# Patient Record
Sex: Male | Born: 1975 | Race: White | Hispanic: No | Smoking: Current every day smoker
Health system: Southern US, Community
[De-identification: ages and names within clinical notes are randomized; demographics above are authoritative.]

## PROBLEM LIST (undated history)

## (undated) DIAGNOSIS — M5136 Other intervertebral disc degeneration, lumbar region: Secondary | ICD-10-CM

## (undated) DIAGNOSIS — F419 Anxiety disorder, unspecified: Secondary | ICD-10-CM

## (undated) DIAGNOSIS — Z765 Malingerer [conscious simulation]: Secondary | ICD-10-CM

## (undated) DIAGNOSIS — F1910 Other psychoactive substance abuse, uncomplicated: Secondary | ICD-10-CM

## (undated) DIAGNOSIS — M51369 Other intervertebral disc degeneration, lumbar region without mention of lumbar back pain or lower extremity pain: Secondary | ICD-10-CM

## (undated) DIAGNOSIS — F329 Major depressive disorder, single episode, unspecified: Secondary | ICD-10-CM

## (undated) DIAGNOSIS — R4586 Emotional lability: Secondary | ICD-10-CM

## (undated) DIAGNOSIS — F32A Depression, unspecified: Secondary | ICD-10-CM

## (undated) DIAGNOSIS — M21619 Bunion of unspecified foot: Secondary | ICD-10-CM

## (undated) DIAGNOSIS — F319 Bipolar disorder, unspecified: Secondary | ICD-10-CM

## (undated) DIAGNOSIS — M2010 Hallux valgus (acquired), unspecified foot: Secondary | ICD-10-CM

## (undated) HISTORY — PX: OTHER SURGICAL HISTORY: SHX169

---

## 2004-06-10 ENCOUNTER — Emergency Department (HOSPITAL_COMMUNITY): Admission: EM | Admit: 2004-06-10 | Discharge: 2004-06-10 | Payer: Self-pay | Admitting: Emergency Medicine

## 2005-02-27 ENCOUNTER — Emergency Department (HOSPITAL_COMMUNITY): Admission: EM | Admit: 2005-02-27 | Discharge: 2005-02-27 | Payer: Self-pay | Admitting: Emergency Medicine

## 2005-07-29 ENCOUNTER — Emergency Department (HOSPITAL_COMMUNITY): Admission: EM | Admit: 2005-07-29 | Discharge: 2005-07-29 | Payer: Self-pay | Admitting: Emergency Medicine

## 2007-10-03 ENCOUNTER — Emergency Department (HOSPITAL_COMMUNITY): Admission: EM | Admit: 2007-10-03 | Discharge: 2007-10-03 | Payer: Self-pay | Admitting: Emergency Medicine

## 2007-10-26 ENCOUNTER — Emergency Department (HOSPITAL_COMMUNITY): Admission: EM | Admit: 2007-10-26 | Discharge: 2007-10-26 | Payer: Self-pay | Admitting: Emergency Medicine

## 2007-10-31 ENCOUNTER — Emergency Department (HOSPITAL_COMMUNITY): Admission: EM | Admit: 2007-10-31 | Discharge: 2007-10-31 | Payer: Self-pay | Admitting: Emergency Medicine

## 2008-02-11 ENCOUNTER — Emergency Department (HOSPITAL_COMMUNITY): Admission: EM | Admit: 2008-02-11 | Discharge: 2008-02-11 | Payer: Self-pay | Admitting: Internal Medicine

## 2008-03-01 ENCOUNTER — Emergency Department (HOSPITAL_COMMUNITY): Admission: EM | Admit: 2008-03-01 | Discharge: 2008-03-01 | Payer: Self-pay | Admitting: Emergency Medicine

## 2010-11-10 ENCOUNTER — Emergency Department (HOSPITAL_COMMUNITY): Payer: Self-pay

## 2010-11-10 ENCOUNTER — Emergency Department (HOSPITAL_COMMUNITY)
Admission: EM | Admit: 2010-11-10 | Discharge: 2010-11-10 | Disposition: A | Discharge status: Home / Self Care | Payer: Self-pay | Attending: Emergency Medicine | Admitting: Emergency Medicine

## 2010-11-10 DIAGNOSIS — Y93B3 Activity, free weights: Secondary | ICD-10-CM | POA: Insufficient documentation

## 2010-11-10 DIAGNOSIS — S46909A Unspecified injury of unspecified muscle, fascia and tendon at shoulder and upper arm level, unspecified arm, initial encounter: Secondary | ICD-10-CM | POA: Insufficient documentation

## 2010-11-10 DIAGNOSIS — M25519 Pain in unspecified shoulder: Secondary | ICD-10-CM | POA: Insufficient documentation

## 2010-11-10 DIAGNOSIS — X500XXA Overexertion from strenuous movement or load, initial encounter: Secondary | ICD-10-CM | POA: Insufficient documentation

## 2010-11-10 DIAGNOSIS — S4980XA Other specified injuries of shoulder and upper arm, unspecified arm, initial encounter: Secondary | ICD-10-CM | POA: Insufficient documentation

## 2010-12-06 ENCOUNTER — Emergency Department (HOSPITAL_COMMUNITY): Payer: Self-pay

## 2010-12-06 ENCOUNTER — Emergency Department (HOSPITAL_COMMUNITY)
Admission: EM | Admit: 2010-12-06 | Discharge: 2010-12-06 | Disposition: A | Discharge status: Home / Self Care | Payer: Self-pay | Attending: Emergency Medicine | Admitting: Emergency Medicine

## 2010-12-06 DIAGNOSIS — M25476 Effusion, unspecified foot: Secondary | ICD-10-CM | POA: Insufficient documentation

## 2010-12-06 DIAGNOSIS — M25473 Effusion, unspecified ankle: Secondary | ICD-10-CM | POA: Insufficient documentation

## 2010-12-06 DIAGNOSIS — M25579 Pain in unspecified ankle and joints of unspecified foot: Secondary | ICD-10-CM | POA: Insufficient documentation

## 2010-12-06 DIAGNOSIS — S93409A Sprain of unspecified ligament of unspecified ankle, initial encounter: Secondary | ICD-10-CM | POA: Insufficient documentation

## 2010-12-06 DIAGNOSIS — X500XXA Overexertion from strenuous movement or load, initial encounter: Secondary | ICD-10-CM | POA: Insufficient documentation

## 2010-12-25 ENCOUNTER — Emergency Department (HOSPITAL_COMMUNITY): Payer: Self-pay

## 2010-12-25 ENCOUNTER — Emergency Department (HOSPITAL_COMMUNITY)
Admission: EM | Admit: 2010-12-25 | Discharge: 2010-12-25 | Disposition: A | Discharge status: Home / Self Care | Payer: Self-pay | Attending: Emergency Medicine | Admitting: Emergency Medicine

## 2010-12-25 DIAGNOSIS — F17200 Nicotine dependence, unspecified, uncomplicated: Secondary | ICD-10-CM | POA: Insufficient documentation

## 2010-12-25 DIAGNOSIS — M25519 Pain in unspecified shoulder: Secondary | ICD-10-CM | POA: Insufficient documentation

## 2011-04-15 ENCOUNTER — Emergency Department (HOSPITAL_COMMUNITY): Payer: Self-pay

## 2011-04-15 ENCOUNTER — Emergency Department (HOSPITAL_COMMUNITY)
Admission: EM | Admit: 2011-04-15 | Discharge: 2011-04-15 | Disposition: A | Discharge status: Home / Self Care | Payer: Self-pay | Attending: Emergency Medicine | Admitting: Emergency Medicine

## 2011-04-15 ENCOUNTER — Encounter: Payer: Self-pay | Admitting: Emergency Medicine

## 2011-04-15 DIAGNOSIS — M21611 Bunion of right foot: Secondary | ICD-10-CM

## 2011-04-15 DIAGNOSIS — S9030XA Contusion of unspecified foot, initial encounter: Secondary | ICD-10-CM | POA: Insufficient documentation

## 2011-04-15 DIAGNOSIS — W1789XA Other fall from one level to another, initial encounter: Secondary | ICD-10-CM | POA: Insufficient documentation

## 2011-04-15 DIAGNOSIS — M21619 Bunion of unspecified foot: Secondary | ICD-10-CM | POA: Insufficient documentation

## 2011-04-15 DIAGNOSIS — S335XXA Sprain of ligaments of lumbar spine, initial encounter: Secondary | ICD-10-CM | POA: Insufficient documentation

## 2011-04-15 DIAGNOSIS — S9032XA Contusion of left foot, initial encounter: Secondary | ICD-10-CM

## 2011-04-15 DIAGNOSIS — S39012A Strain of muscle, fascia and tendon of lower back, initial encounter: Secondary | ICD-10-CM

## 2011-04-15 DIAGNOSIS — Y9269 Other specified industrial and construction area as the place of occurrence of the external cause: Secondary | ICD-10-CM | POA: Insufficient documentation

## 2011-04-15 DIAGNOSIS — F17200 Nicotine dependence, unspecified, uncomplicated: Secondary | ICD-10-CM | POA: Insufficient documentation

## 2011-04-15 HISTORY — DX: Anxiety disorder, unspecified: F41.9

## 2011-04-15 MED ORDER — CYCLOBENZAPRINE HCL 10 MG PO TABS: ORAL_TABLET | ORAL | Status: DC

## 2011-04-15 MED ORDER — IBUPROFEN 800 MG PO TABS: 800.0000 mg | ORAL_TABLET | Freq: Once | ORAL | Status: DC

## 2011-04-15 NOTE — ED Notes (Signed)
Pt states 2 days ago he fell off the back of the truck at work and now c/o lower back pain and states he has corns on his toes.

## 2011-04-15 NOTE — ED Provider Notes (Signed)
History     CSN: [id number] Arrival date & time: 04/15/2011 10:16 AM  Chief Complaint  Patient presents with  . Fall  . Foot Pain  . Back Pain    (Consider location/radiation/quality/duration/timing/severity/associated sxs/prior treatment) Patient is a 35 y.o. male presenting with fall, lower extremity pain, and back pain. The history is provided by the patient. No language interpreter was used.  Fall The accident occurred 2 days ago. Incident: pt was sitting on the raised tailgate of his employers truck.  when he came to a stop he fell out of the truck onto concrete injuring lower back and L foot. He fell from a height of 3 to 5 ft. He landed on concrete. There was no blood loss.  Foot Pain  Back Pain     Past Medical History  Diagnosis Date  . Anxiety     Past Surgical History  Procedure Date  . Fatty tumor removed from left foot     History reviewed. No pertinent family history.  History  Substance Use Topics  . Smoking status: Current Everyday Smoker -- 0.5 packs/day    Types: Cigarettes  . Smokeless tobacco: Not on file  . Alcohol Use: No      Review of Systems  Musculoskeletal: Positive for back pain.       Foot/toe pain  All other systems reviewed and are negative.    Allergies  Review of patient's allergies indicates no known allergies.  Home Medications  No current outpatient prescriptions on file.  BP 155/86  Pulse 76  Temp(Src) 98 F (36.7 C) (Oral)  Resp 17  Ht 5\' 11"  (1.803 m)  Wt 188 lb (85.276 kg)  BMI 26.22 kg/m2  SpO2 98%  Physical Exam  Nursing note and vitals reviewed. Constitutional: He is oriented to person, place, and time. Vital signs are normal. He appears well-developed and well-nourished. No distress.  HENT:  Head: Normocephalic and atraumatic.  Right Ear: External ear normal.  Left Ear: External ear normal.  Nose: Nose normal.  Mouth/Throat: No oropharyngeal exudate.  Eyes: Conjunctivae and EOM are normal. Pupils  are equal, round, and reactive to light. Right eye exhibits no discharge. Left eye exhibits no discharge. No scleral icterus.  Neck: Normal range of motion. Neck supple. No JVD present. No tracheal deviation present. No thyromegaly present.  Cardiovascular: Normal rate, regular rhythm, normal heart sounds, intact distal pulses and normal pulses.  Exam reveals no gallop and no friction rub.   No murmur heard. Pulmonary/Chest: Effort normal and breath sounds normal. No stridor. No respiratory distress. He has no wheezes. He has no rales. He exhibits no tenderness.  Abdominal: Soft. Normal appearance and bowel sounds are normal. He exhibits no distension and no mass. There is no tenderness. There is no rebound and no guarding.  Musculoskeletal: He exhibits tenderness. He exhibits no edema.       Back:       Left foot: He exhibits decreased range of motion, tenderness and bony tenderness. He exhibits no swelling, normal capillary refill, no crepitus, no deformity and no laceration.       Feet:  Lymphadenopathy:    He has no cervical adenopathy.  Neurological: He is alert and oriented to person, place, and time. He has normal reflexes. Coordination normal. GCS eye subscore is 4. GCS verbal subscore is 5. GCS motor subscore is 6.  Skin: Skin is warm and dry. No rash noted. He is not diaphoretic.  Psychiatric: He has a normal mood and affect.  His speech is normal and behavior is normal. Judgment and thought content normal. Cognition and memory are normal.    ED Course  Procedures (including critical care time)  Labs Reviewed - No data to display No results found.   No diagnosis found.    MDM          Worthy Rancher, PA 04/15/11 [phone]

## 2011-04-15 NOTE — ED Notes (Signed)
Pt c/o pain in his lower back and both feet. States that he fell off the back of a truck 2 days ago at work and hit the [employer]. Abrasion noted to left lower back. Also c/o "corns" on the inside of both feet that are hurting. Pt alert and oriented x 3. Skin warm and dry. Color pink. Breath sounds clear and equal bilaterally. Pt states that he is not filing workman's comp.

## 2011-04-15 NOTE — ED Notes (Signed)
Pt waiting for xray result

## 2011-04-15 NOTE — ED Notes (Signed)
Patient was just worried about his xrays and his ride leaving him. RN Kendal Hymen aware.

## 2011-04-15 NOTE — ED Notes (Signed)
Pt states that he would like to file a grievance because his feet are hurting and we did not give him any pain medicine. Diamantina Monks in to see patient. Johny Drilling and Lurene Shadow in to speak with patient. Pt discharged. Pt also upset because his ride was gone and we didn't give him any pain meds and he was going to have to walk. Pt found a ride per security.

## 2011-04-15 NOTE — ED Provider Notes (Signed)
Evaluation and management procedures were performed by the mid-level provider (PA/NP/CNM) under my supervision/collaboration. I was present and available during the ED course. Ruchy Wildrick Y.   Gavin Pound. Camila Maita, MD 04/15/11 1216

## 2011-10-10 ENCOUNTER — Encounter (HOSPITAL_COMMUNITY): Payer: Self-pay | Admitting: Emergency Medicine

## 2011-10-10 ENCOUNTER — Emergency Department (HOSPITAL_COMMUNITY)
Admission: EM | Admit: 2011-10-10 | Discharge: 2011-10-10 | Disposition: A | Discharge status: Home / Self Care | Payer: Self-pay | Attending: Emergency Medicine | Admitting: Emergency Medicine

## 2011-10-10 DIAGNOSIS — M2010 Hallux valgus (acquired), unspecified foot: Secondary | ICD-10-CM

## 2011-10-10 DIAGNOSIS — F17200 Nicotine dependence, unspecified, uncomplicated: Secondary | ICD-10-CM | POA: Insufficient documentation

## 2011-10-10 DIAGNOSIS — M25539 Pain in unspecified wrist: Secondary | ICD-10-CM | POA: Insufficient documentation

## 2011-10-10 DIAGNOSIS — T148XXA Other injury of unspecified body region, initial encounter: Secondary | ICD-10-CM

## 2011-10-10 HISTORY — DX: Hallux valgus (acquired), unspecified foot: M20.10

## 2011-10-10 HISTORY — DX: Bunion of unspecified foot: M21.619

## 2011-10-10 MED ORDER — NAPROXEN 250 MG PO TABS: 250.0000 mg | ORAL_TABLET | Freq: Two times a day (BID) | ORAL | Status: DC

## 2011-10-10 MED ORDER — HYDROCODONE-ACETAMINOPHEN 5-325 MG PO TABS: ORAL_TABLET | ORAL | Status: AC

## 2011-10-10 MED ORDER — METAXALONE 800 MG PO TABS: 800.0000 mg | ORAL_TABLET | Freq: Three times a day (TID) | ORAL | Status: AC

## 2011-10-10 NOTE — ED Notes (Signed)
Pt arrived via EMS secondary to bilateral wrist pain and bilateral calluses on feet. Pt states calluses are "to the bone, very painful with walking and wrist just hurt especially when I pick something up." No distress noted at this time. Pt denies injury at this time.

## 2011-10-10 NOTE — ED Provider Notes (Signed)
History     CSN: [id number]  Arrival date & time 10/10/11  1229   First MD Initiated Contact with Patient 10/10/11 1312      Chief Complaint  Patient presents with  . Wrist Pain    HPI Pt was seen at 1320.   Per pt, c/o gradual onset and persistence of constant bilat palmar hands and volar wrists/forearms "pain" for the past 1 week.  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  Describes the pain as "soreness."  Pt also c/o gradual onset and persistence of constant bilat feet bunions that "are still there" for the past several years.  Denies direct injury to wrists or hands, no fevers, no rash, no swelling, no tingling/numbness in extremities, no focal motor weakness, no neck or back pain.    Past Medical History  Diagnosis Date  . Anxiety   . Hallux valgus with bunions     Past Surgical History  Procedure Date  . Fatty tumor removed from left foot     History  Substance Use Topics  . Smoking status: Current Everyday Smoker -- 0.5 packs/day    Types: Cigarettes  . Smokeless tobacco: Not on file  . Alcohol Use: No    Review of Systems ROS: Statement: All systems negative except as marked or noted in the HPI; Constitutional: Negative for fever and chills. ; ; Eyes: Negative for eye pain, redness and discharge. ; ; ENMT: Negative for ear pain, hoarseness, nasal congestion, sinus pressure and sore throat. ; ; Cardiovascular: Negative for chest pain, palpitations, diaphoresis, dyspnea and peripheral edema. ; ; Respiratory: Negative for cough, wheezing and stridor. ; ; Gastrointestinal: Negative for nausea, vomiting, diarrhea, abdominal pain, blood in stool, hematemesis, jaundice and rectal bleeding. . ; ; Genitourinary: Negative for dysuria, flank pain and hematuria. ; ; Musculoskeletal: +bilat wrist pain.  Negative for back pain and neck pain. Negative for swelling and trauma.; ; Skin: +bunions to bilat feet with calluses.  Negative for pruritus, rash, abrasions, blisters, bruising and skin lesion.; ; Neuro: Negative for headache, lightheadedness and neck stiffness. Negative for weakness, altered level of consciousness , altered mental status, extremity weakness, paresthesias, involuntary movement, seizure and syncope.      Allergies  Review of patient's allergies indicates no known allergies.  Home Medications   Current Outpatient Rx  Name Route Sig Dispense Refill  . CYCLOBENZAPRINE HCL 10 MG PO TABS  1/2 to one tab po TID prn back pain. 20 tablet 0  . IBUPROFEN 200 MG PO TABS Oral Take 200 mg by mouth every 6 (six) hours as needed. Pain     . ONE-A-DAY MENS PO TABS Oral Take 1 tablet by mouth daily.        BP 131/89  Pulse 84  Temp(Src) 97.4 F (36.3 C) (Oral)  Resp 18  Ht 5\' 11"  (1.803 m)  Wt 170 lb (77.111 kg)  BMI 23.71 kg/m2  SpO2 100%  Physical Exam 1325: Physical examination:  Nursing notes reviewed; Vital signs and O2 SAT reviewed;  Constitutional: Well developed, Well nourished, Well hydrated, In no acute distress; Head:  Normocephalic, atraumatic; Eyes: EOMI, PERRL, No scleral icterus; ENMT: Mouth and pharynx normal, Mucous membranes moist; Neck: Supple, Full range of motion, No lymphadenopathy; Cardiovascular: Regular rate and rhythm, No murmur, rub, or gallop; Respiratory: Breath sounds clear & equal bilaterally, No rales, rhonchi, wheezes, or rub, Normal respiratory effort/excursion; Chest: Nontender, Movement normal; Extremities: Pulses normal,  No tenderness, No edema, No calf edema or asymmetry. Distal NMS intact with bilat hands having intact sensation and strength in the distribution of the median, radial, and ulnar nerve function.  Strong radial pulses bilat.  +FROM bilat wrists and elbows with intact motor strength biceps and triceps muscles to resistance.  No bilat snuffbox tenderness.  No pain to bilat axial thumb or 3rd MCP loading.  Bilat forearm compartments soft, brisk cap refill  in fingers.  Neg Tinel's and Phalen's at bilat wrists; Neuro: AA&Ox3, Major CN grossly intact.  No gross focal motor or sensory deficits in extremities.; Skin: Color normal, Warm, Dry, no rash.    ED Course  Procedures   MDM  MDM Reviewed: previous chart, nursing note and vitals      1:42 PM:  When pt demonstrates his repetitive hand and wrist "grabbing" and "pulling" movements he performs at work, he c/o soreness in his palmar hand and volar wrists/forearms; appears to be muscle overuse at this time.  NMS intact, strong radial pulses.  Pt with known bunions on bilat feet, has not seen a Podiatrist yet.  Explained his bunions/hallux deformities will still continue (don't spontaneously "go away").  Pt encouraged to pad his work boots and f/u with his PMD and Podiatrist for his chronic medical issues for good continuity of care.  Verb understanding.         Laray Anger, DO 10/12/11 1157

## 2011-10-10 NOTE — ED Notes (Signed)
Pt arrived by EMS for bilat wrist pain x 1 week

## 2011-10-10 NOTE — Discharge Instructions (Signed)
RESOURCE GUIDE  Dental Problems  Patients with Medicaid: [facility]                     [facility] [address]                                           [address] Phone:  [phone]                                                  Phone:  [phone]  If unable to pay or uninsured, contact:  [facility] or [facility] to become qualified for the adult dental clinic.  Chronic Pain Problems Contact [facility]  [phone] Patients need to be referred by their primary care doctor.  Insufficient Money for Medicine Contact [employer]:  call "211" or [facility] 271-5999.  No Primary Care Doctor Call [facility]  832-8000 Other agencies that provide inexpensive medical care    [facility]  [phone]    [facility]  [phone]    [facility]  [phone]    [facility] Clinic  [phone]    [facility]  [phone]    [facility]  [phone]  Psychological Services [facility]  [phone] [facility]  [phone] [facility]   [phone] (emergency services [phone])  Substance Abuse Resources [facility]  336-882-2125 [facility] 336-784-9470 [facility] 336-285-9073 [facility] 336-845-3988 Residential & Outpatient Substance Abuse Program  800-659-3381  Abuse/Neglect [place] Child Abuse Hotline (336) 641-3795 [place] Child Abuse Hotline 800-378-5315 (After Hours)  Emergency Shelter [facility] (336) 271-5985  Maternity Homes [facility] (336) 275-9566 [facility] (704) 372-4663  MRSA Hotline #:   832-7006    [place] Resources  [facility] [place]     [employer]                          [facility] [address] [place]                       [address]      [address]  [place]                                                [place]                            [place] Phone:  [phone]                                   Phone:  [phone]                 Phone:  [phone]  [facility] Phone:  [phone]    [facility] Child Abuse Hotline (310)301-4580 (618) 009-2945 (After Hours)    Take the prescriptions as directed.  Apply moist heat or ice to the area(s) of discomfort, for 15 minutes at a time, several times per day for the next few days.  Do not fall asleep on a heating or ice pack.  Call your regular medical doctor and your Podiatrist on Monday to schedule a follow up appointment this week.  Return to the Emergency Department immediately if worsening.

## 2012-05-08 ENCOUNTER — Emergency Department (HOSPITAL_COMMUNITY)
Admission: EM | Admit: 2012-05-08 | Discharge: 2012-05-08 | Disposition: A | Discharge status: Home / Self Care | Payer: Self-pay | Attending: Emergency Medicine | Admitting: Emergency Medicine

## 2012-05-08 ENCOUNTER — Encounter (HOSPITAL_COMMUNITY): Payer: Self-pay | Admitting: Emergency Medicine

## 2012-05-08 DIAGNOSIS — F17200 Nicotine dependence, unspecified, uncomplicated: Secondary | ICD-10-CM | POA: Insufficient documentation

## 2012-05-08 DIAGNOSIS — R109 Unspecified abdominal pain: Secondary | ICD-10-CM | POA: Insufficient documentation

## 2012-05-08 DIAGNOSIS — F411 Generalized anxiety disorder: Secondary | ICD-10-CM | POA: Insufficient documentation

## 2012-05-08 DIAGNOSIS — J029 Acute pharyngitis, unspecified: Secondary | ICD-10-CM | POA: Insufficient documentation

## 2012-05-08 DIAGNOSIS — Z79899 Other long term (current) drug therapy: Secondary | ICD-10-CM | POA: Insufficient documentation

## 2012-05-08 LAB — COMPREHENSIVE METABOLIC PANEL
BUN: 9 mg/dL (ref 6–23)
CO2: 28 mEq/L (ref 19–32)
Chloride: 103 mEq/L (ref 96–112)
Creatinine, Ser: 0.9 mg/dL (ref 0.50–1.35)
GFR calc Af Amer: >90 mL/min (ref >90)
GFR calc non Af Amer: >90 mL/min (ref >90)
Glucose, Bld: 93 mg/dL (ref 70–99)
Total Bilirubin: 0.5 mg/dL (ref 0.3–1.2)

## 2012-05-08 LAB — CBC WITH DIFFERENTIAL/PLATELET
Eosinophils Relative: 0 % (ref 0–5)
HCT: 40.8 % (ref 39.0–52.0)
Hemoglobin: 14.1 g/dL (ref 13.0–17.0)
Lymphocytes Relative: 34 % (ref 12–46)
MCV: 91.5 fL (ref 78.0–100.0)
Monocytes Absolute: 0.6 10*3/uL (ref 0.1–1.0)
Monocytes Relative: 7 % (ref 3–12)
Neutro Abs: 4.5 10*3/uL (ref 1.7–7.7)
WBC: 7.7 10*3/uL (ref 4.0–10.5)

## 2012-05-08 LAB — URINALYSIS, ROUTINE W REFLEX MICROSCOPIC
Bilirubin Urine: NEGATIVE
Ketones, ur: NEGATIVE mg/dL
Nitrite: NEGATIVE
Protein, ur: NEGATIVE mg/dL
Urobilinogen, UA: 0.2 mg/dL (ref 0.0–1.0)
pH: 6 (ref 5.0–8.0)

## 2012-05-08 LAB — LIPASE, BLOOD: Lipase: 22 U/L (ref 11–59)

## 2012-05-08 LAB — MONONUCLEOSIS SCREEN: Mono Screen: NEGATIVE

## 2012-05-08 MED ORDER — IBUPROFEN 400 MG PO TABS: 400.0000 mg | ORAL_TABLET | Freq: Four times a day (QID) | ORAL | Status: DC | PRN

## 2012-05-08 NOTE — ED Notes (Signed)
Patient requesting pain meds, physician notified.

## 2012-05-08 NOTE — ED Notes (Signed)
Patient c/o general pain all over, states that he has swollen glands in his abdomen and throat, c/o sore throat at this time. States that he just hurts all over and that this has been going on for 2 years now but it's now gotten so bad that he thought he needed to be checked out.

## 2012-05-08 NOTE — ED Notes (Signed)
abd pain with nodules under skin on abd that have gotten worse.

## 2012-05-08 NOTE — ED Notes (Signed)
Pt escorted from the e.d. By [employer] officers due to pt being irate, upset, wouldn't listen to this nurse about discharge

## 2012-05-08 NOTE — ED Notes (Signed)
Patient aggressive, yelling at staff, security at bedside. Patient refused to sign discharge papers, police escorted patient off of property.

## 2012-05-08 NOTE — ED Provider Notes (Signed)
History  This chart was scribed for Robert Octave, MD by Ardeen Jourdain. This patient was seen in room APA10/APA10 and the patient's care was started at 1959.  CSN: [id number]  Arrival date & time 05/08/12  1954   First MD Initiated Contact with Patient 05/08/12 1959      Chief Complaint  Patient presents with  . Abdominal Pain    c/o of abdominal pain  . Sore Throat    states that his throat hurts and his glands are swollen     The history is provided by the patient. No language interpreter was used.    Robert Kramer is a 36 y.o. male who presents to the Emergency Department complaining of generalized body aches with associated sore throat, fever, chills, nausea and a nodule on his abdomen. He denies emesis. He states that he has had this pain intermittently for the past two years but that today his pain suddenly worsened. He has a h/o anxiety and hallux valgus with bunions. He is a current everyday smoker and occasional alcohol user.    Past Medical History  Diagnosis Date  . Anxiety   . Hallux valgus with bunions     Past Surgical History  Procedure Date  . Fatty tumor removed from left foot     History reviewed. No pertinent family history.  History  Substance Use Topics  . Smoking status: Current Every Day Smoker -- 0.5 packs/day    Types: Cigarettes  . Smokeless tobacco: Not on file  . Alcohol Use: No      Review of Systems  All other systems reviewed and are negative.  A complete 10 system review of systems was obtained and all systems are negative except as noted in the HPI and PMH.    Allergies  Review of patient's allergies indicates no known allergies.  Home Medications   Current Outpatient Rx  Name  Route  Sig  Dispense  Refill  . IBUPROFEN 200 MG PO TABS   Oral   Take 800 mg by mouth every 6 (six) hours as needed. pain         . IBUPROFEN 400 MG PO TABS   Oral   Take 1 tablet (400 mg total) by mouth every 6 (six) hours as needed for  pain.   30 tablet   0   . LORAZEPAM 0.5 MG PO TABS   Oral   Take 0.5 mg by mouth every 8 (eight) hours.         Marland Kitchen NAPROXEN 250 MG PO TABS   Oral   Take 1 tablet (250 mg total) by mouth 2 (two) times daily with a meal.   14 tablet   0     Triage Vitals: BP 133/71  Pulse 87  Temp 97.8 F (36.6 C) (Oral)  Resp 15  Ht 5\' 11"  (1.803 m)  Wt 170 lb (77.111 kg)  BMI 23.71 kg/m2  SpO2 97%  Physical Exam  Nursing note and vitals reviewed. Constitutional: He is oriented to person, place, and time. He appears well-developed and well-nourished. No distress.  HENT:  Head: Normocephalic and atraumatic.  Mouth/Throat: Oropharynx is clear and moist. No oropharyngeal exudate.       Slight erythema in throat, no asymmetry, no meningismus   Eyes: EOM are normal. Pupils are equal, round, and reactive to light.  Neck: Normal range of motion. Neck supple. No tracheal deviation present.  Cardiovascular: Normal rate, regular rhythm and normal heart sounds.   Pulmonary/Chest: Effort  normal and breath sounds normal. No respiratory distress.  Abdominal: Soft. He exhibits no distension. There is no tenderness.       No CVA tenderness  Musculoskeletal: Normal range of motion. He exhibits no edema.  Neurological: He is alert and oriented to person, place, and time.       2-12 cranial nerves intact, 5-5 strength throughout   Skin: Skin is warm and dry.       0.5 firm nodule superior to umbilicus    Psychiatric: He has a normal mood and affect. His behavior is normal.    ED Course  Procedures (including critical care time)  DIAGNOSTIC STUDIES: Oxygen Saturation is 97% on room air, normal by my interpretation.    COORDINATION OF CARE:  8:30 PM: Discussed treatment plan with pt at bedside and pt agreed to plan.    Results for orders placed during the hospital encounter of 05/08/12  CBC WITH DIFFERENTIAL      Component Value Range   WBC 7.7  4.0 - 10.5 K/uL   RBC 4.46  4.22 - 5.81 MIL/uL    Hemoglobin 14.1  13.0 - 17.0 g/dL   HCT 16.1  09.6 - 04.5 %   MCV 91.5  78.0 - 100.0 fL   MCH 31.6  26.0 - 34.0 pg   MCHC 34.6  30.0 - 36.0 g/dL   RDW 40.9  81.1 - 91.4 %   Platelets 142 (*) 150 - 400 K/uL   Neutrophils Relative 59  43 - 77 %   Neutro Abs 4.5  1.7 - 7.7 K/uL   Lymphocytes Relative 34  12 - 46 %   Lymphs Abs 2.6  0.7 - 4.0 K/uL   Monocytes Relative 7  3 - 12 %   Monocytes Absolute 0.6  0.1 - 1.0 K/uL   Eosinophils Relative 0  0 - 5 %   Eosinophils Absolute 0.0  0.0 - 0.7 K/uL   Basophils Relative 0  0 - 1 %   Basophils Absolute 0.0  0.0 - 0.1 K/uL  COMPREHENSIVE METABOLIC PANEL      Component Value Range   Sodium 139  135 - 145 mEq/L   Potassium 3.6  3.5 - 5.1 mEq/L   Chloride 103  96 - 112 mEq/L   CO2 28  19 - 32 mEq/L   Glucose, Bld 93  70 - 99 mg/dL   BUN 9  6 - 23 mg/dL   Creatinine, Ser 7.82  0.50 - 1.35 mg/dL   Calcium 9.7  8.4 - 95.6 mg/dL   Total Protein 6.8  6.0 - 8.3 g/dL   Albumin 3.4 (*) 3.5 - 5.2 g/dL   AST 34  0 - 37 U/L   ALT 52  0 - 53 U/L   Alkaline Phosphatase 63  39 - 117 U/L   Total Bilirubin 0.5  0.3 - 1.2 mg/dL   GFR calc non Af Amer >90  >90 mL/min   GFR calc Af Amer >90  >90 mL/min  LIPASE, BLOOD      Component Value Range   Lipase 22  11 - 59 U/L  MONONUCLEOSIS SCREEN      Component Value Range   Mono Screen NEGATIVE  NEGATIVE  URINALYSIS, ROUTINE W REFLEX MICROSCOPIC      Component Value Range   Color, Urine YELLOW  YELLOW   APPearance CLEAR  CLEAR   Specific Gravity, Urine <1.005 (*) 1.005 - 1.030   pH 6.0  5.0 - 8.0  Glucose, UA NEGATIVE  NEGATIVE mg/dL   Hgb urine dipstick NEGATIVE  NEGATIVE   Bilirubin Urine NEGATIVE  NEGATIVE   Ketones, ur NEGATIVE  NEGATIVE mg/dL   Protein, ur NEGATIVE  NEGATIVE mg/dL   Urobilinogen, UA 0.2  0.0 - 1.0 mg/dL   Nitrite NEGATIVE  NEGATIVE   Leukocytes, UA NEGATIVE  NEGATIVE  RAPID STREP SCREEN      Component Value Range   Streptococcus, Group A Screen (Direct) NEGATIVE  NEGATIVE    No results found.   1. Abdominal pain       MDM  C/o pain in abdomen at site of "bump" above umbilicus.  Intermittent for past 2 years. No vomiting, diarrhea, fever, chest pain SOB.  Good PO intake and urine output.  Nodule on abdominal wall is nontender, not flutuant. No cellulitis. Abdomen completely soft and nontender.  Also c\/o sore throat x 2 days without difficulty breathing or swallowing. Labs and UA unremarkable. Patient nontoxic appearing.  Needs PCP followup.  Patient upset he "didn't get an Xray".  Belligerent and uncooperative at discharge.  I personally performed the services described in this documentation, which was scribed in my presence.  The recorded information has been reviewed and considered.    Robert Octave, MD 05/09/12 0157

## 2012-05-08 NOTE — ED Notes (Signed)
Patient ambulated around nurses station without difficulty. Patient is drinking water at this time. MD notified.

## 2012-08-31 ENCOUNTER — Emergency Department (HOSPITAL_COMMUNITY): Payer: Self-pay

## 2012-08-31 ENCOUNTER — Emergency Department (HOSPITAL_COMMUNITY)
Admission: EM | Admit: 2012-08-31 | Discharge: 2012-08-31 | Disposition: A | Discharge status: Home / Self Care | Payer: Self-pay | Attending: Emergency Medicine | Admitting: Emergency Medicine

## 2012-08-31 ENCOUNTER — Encounter (HOSPITAL_COMMUNITY): Payer: Self-pay

## 2012-08-31 DIAGNOSIS — X500XXA Overexertion from strenuous movement or load, initial encounter: Secondary | ICD-10-CM | POA: Insufficient documentation

## 2012-08-31 DIAGNOSIS — M21619 Bunion of unspecified foot: Secondary | ICD-10-CM | POA: Insufficient documentation

## 2012-08-31 DIAGNOSIS — Y929 Unspecified place or not applicable: Secondary | ICD-10-CM | POA: Insufficient documentation

## 2012-08-31 DIAGNOSIS — M2010 Hallux valgus (acquired), unspecified foot: Secondary | ICD-10-CM | POA: Insufficient documentation

## 2012-08-31 DIAGNOSIS — Z8659 Personal history of other mental and behavioral disorders: Secondary | ICD-10-CM | POA: Insufficient documentation

## 2012-08-31 DIAGNOSIS — S93409A Sprain of unspecified ligament of unspecified ankle, initial encounter: Secondary | ICD-10-CM | POA: Insufficient documentation

## 2012-08-31 DIAGNOSIS — S93401A Sprain of unspecified ligament of right ankle, initial encounter: Secondary | ICD-10-CM

## 2012-08-31 DIAGNOSIS — F17200 Nicotine dependence, unspecified, uncomplicated: Secondary | ICD-10-CM | POA: Insufficient documentation

## 2012-08-31 DIAGNOSIS — Y939 Activity, unspecified: Secondary | ICD-10-CM | POA: Insufficient documentation

## 2012-08-31 MED ORDER — HYDROCODONE-ACETAMINOPHEN 5-325 MG PO TABS
2.0000 | ORAL_TABLET | Freq: Once | ORAL | Status: AC
  Administered 2012-08-31: 2 via ORAL
  Filled 2012-08-31: qty 2

## 2012-08-31 MED ORDER — HYDROCODONE-ACETAMINOPHEN 5-325 MG PO TABS: 1.0000 | ORAL_TABLET | ORAL | Status: DC | PRN

## 2012-08-31 MED ORDER — KETOROLAC TROMETHAMINE 10 MG PO TABS
10.0000 mg | ORAL_TABLET | Freq: Once | ORAL | Status: AC
Start: 2012-08-31 — End: 2012-08-31
  Administered 2012-08-31: 10 mg via ORAL
  Filled 2012-08-31: qty 1

## 2012-08-31 MED ORDER — MELOXICAM 7.5 MG PO TABS: ORAL_TABLET | ORAL | Status: DC

## 2012-08-31 NOTE — ED Provider Notes (Signed)
History     CSN: [id number]  Arrival date & time 08/31/12  1040   First MD Initiated Contact with Patient 08/31/12 1153      Chief Complaint  Patient presents with  . Ankle Pain    (Consider location/radiation/quality/duration/timing/severity/associated sxs/prior treatment) Patient is a 37 y.o. male presenting with ankle pain. The history is provided by the patient.  Ankle Pain Location:  Ankle Time since incident:  4 days Ankle location:  R ankle Pain details:    Quality:  Shooting and throbbing   Severity:  Moderate   Onset quality:  Sudden   Timing:  Constant   Progression:  Worsening Chronicity: new on chronic ankle issue. Dislocation: no   Foreign body present:  No foreign bodies Prior injury to area:  Yes Relieved by:  Nothing Worsened by:  Nothing tried Ineffective treatments:  None tried Associated symptoms: decreased ROM   Associated symptoms: no back pain, no neck pain and no numbness   Risk factors comment:  Several prior injuries to the riht food and ankle.   Past Medical History  Diagnosis Date  . Anxiety   . Hallux valgus with bunions     Past Surgical History  Procedure Laterality Date  . Fatty tumor removed from left foot      No family history on file.  History  Substance Use Topics  . Smoking status: Current Every Day Smoker -- 0.50 packs/day    Types: Cigarettes  . Smokeless tobacco: Not on file  . Alcohol Use: No      Review of Systems  Constitutional: Negative for activity change.       All ROS Neg except as noted in HPI  HENT: Negative for nosebleeds and neck pain.   Eyes: Negative for photophobia and discharge.  Respiratory: Negative for cough, shortness of breath and wheezing.   Cardiovascular: Negative for chest pain and palpitations.  Gastrointestinal: Negative for abdominal pain and blood in stool.  Genitourinary: Negative for dysuria, frequency and hematuria.  Musculoskeletal: Positive for arthralgias. Negative for back  pain.  Skin: Negative.   Neurological: Negative for dizziness, seizures and speech difficulty.  Psychiatric/Behavioral: Negative for hallucinations and confusion. The patient is nervous/anxious.     Allergies  Review of patient's allergies indicates no known allergies.  Home Medications   Current Outpatient Rx  Name  Route  Sig  Dispense  Refill  . Tetrahydrozoline HCl (VISINE EXTRA OP)   Ophthalmic   Apply 2 drops to eye daily as needed (dry eyes).           BP 123/80  Pulse 60  Temp(Src) 97.2 F (36.2 C) (Oral)  Resp 20  Ht 5\' 11"  (1.803 m)  Wt 175 lb (79.379 kg)  BMI 24.42 kg/m2  SpO2 98%  Physical Exam  Nursing note and vitals reviewed. Constitutional: He is oriented to person, place, and time. He appears well-developed and well-nourished.  Non-toxic appearance.  HENT:  Head: Normocephalic.  Right Ear: Tympanic membrane and external ear normal.  Left Ear: Tympanic membrane and external ear normal.  Eyes: EOM and lids are normal. Pupils are equal, round, and reactive to light.  Neck: Normal range of motion. Neck supple. Carotid bruit is not present.  Cardiovascular: Normal rate, regular rhythm, normal heart sounds, intact distal pulses and normal pulses.   Pulmonary/Chest: Breath sounds normal. No respiratory distress.  Abdominal: Soft. Bowel sounds are normal. There is no tenderness. There is no guarding.  Musculoskeletal: Normal range of motion.  Patient has  a hallux valgus of the right great toe. There is a bunion on the medial surface of the right great toe. There is some deformity of the right foot from previous injury. There is pain to palpation in the lateral malleolus. The dorsalis pedis pulses 2+. The capillary refill is less than 3 seconds. There is good range of motion of the right knee and hip.  Lymphadenopathy:       Head (right side): No submandibular adenopathy present.       Head (left side): No submandibular adenopathy present.    He has no cervical  adenopathy.  Neurological: He is alert and oriented to person, place, and time. He has normal strength. No cranial nerve deficit or sensory deficit.  Skin: Skin is warm and dry.  Psychiatric: He has a normal mood and affect. His speech is normal.    ED Course  Procedures (including critical care time)  Labs Reviewed - No data to display Dg Ankle Complete Right  08/31/2012  *RADIOLOGY REPORT*  Clinical Data: Injury, pain.  RIGHT ANKLE - COMPLETE 3+ VIEW  Comparison: Plain films 12/06/2010.  Findings: Small avulsion fracture off the lateral malleolus is seen on prior study.  No acute bony or joint abnormality is identified. No focal bony lesion.  No tibiotalar joint effusion.  IMPRESSION: No acute finding.  Small, remote avulsion fracture distal fibula.   Original Report Authenticated By: Holley Dexter, M.D.    pulse oximetry 98% on room air. Within normal limits by my interpretation.   No diagnosis found.    MDM  I have reviewed nursing notes, vital signs, and all appropriate lab and imaging results for this patient. Tendon related injuries to the right ankle in the past. 2 days ago he states the ankle" gave away". The patient states that he has not had any evaluation concerning his ankle in several years. The patient has had pain of the right ankle since the incident 2 days ago.  The patient has on examination today deformity of the ankle from previous injuries. Deformity of the foot from previous injuries. X-ray reveals a small avulsion fracture off the lateral malleolus, noted on  a prior study, but no acute changes or findings.  The plan at this time is for the patient be fitted with an ankle stirrup splint, he will be given a work note for the next 4-5 days. Patient will be given a prescription for Mobic 7.5 mg twice daily, and Norco 5 mg every 4 hours if needed for pain. The patient is given the name of the orthopedist on call and strongly encouraged to see the orthopedic specialist.         Kathie Dike, PA 08/31/12 1206

## 2012-08-31 NOTE — ED Notes (Signed)
Pt c/o right ankle pain since Friday. Pt states he rolled ankle and the pain "won't go away".

## 2012-08-31 NOTE — ED Provider Notes (Signed)
Medical screening examination/treatment/procedure(s) were performed by non-physician practitioner and as supervising physician I was immediately available for consultation/collaboration.    Vida Roller, MD 08/31/12 [phone]

## 2012-08-31 NOTE — ED Notes (Signed)
Pt reports turned his r ankle Friday and 2 days ago, his ankle "gave out."

## 2012-11-27 ENCOUNTER — Emergency Department (HOSPITAL_COMMUNITY): Payer: Self-pay

## 2012-11-27 ENCOUNTER — Emergency Department (HOSPITAL_COMMUNITY)
Admission: EM | Admit: 2012-11-27 | Discharge: 2012-11-27 | Disposition: A | Discharge status: Home / Self Care | Payer: Self-pay | Attending: Emergency Medicine | Admitting: Emergency Medicine

## 2012-11-27 ENCOUNTER — Encounter (HOSPITAL_COMMUNITY): Payer: Self-pay | Admitting: Emergency Medicine

## 2012-11-27 DIAGNOSIS — S80212A Abrasion, left knee, initial encounter: Secondary | ICD-10-CM

## 2012-11-27 DIAGNOSIS — F17200 Nicotine dependence, unspecified, uncomplicated: Secondary | ICD-10-CM | POA: Insufficient documentation

## 2012-11-27 DIAGNOSIS — Z8659 Personal history of other mental and behavioral disorders: Secondary | ICD-10-CM | POA: Insufficient documentation

## 2012-11-27 DIAGNOSIS — S0990XA Unspecified injury of head, initial encounter: Secondary | ICD-10-CM | POA: Insufficient documentation

## 2012-11-27 DIAGNOSIS — IMO0002 Reserved for concepts with insufficient information to code with codable children: Secondary | ICD-10-CM | POA: Insufficient documentation

## 2012-11-27 DIAGNOSIS — S0081XA Abrasion of other part of head, initial encounter: Secondary | ICD-10-CM

## 2012-11-27 DIAGNOSIS — S80211A Abrasion, right knee, initial encounter: Secondary | ICD-10-CM

## 2012-11-27 DIAGNOSIS — S0003XA Contusion of scalp, initial encounter: Secondary | ICD-10-CM | POA: Insufficient documentation

## 2012-11-27 DIAGNOSIS — S0590XA Unspecified injury of unspecified eye and orbit, initial encounter: Secondary | ICD-10-CM | POA: Insufficient documentation

## 2012-11-27 DIAGNOSIS — S0083XA Contusion of other part of head, initial encounter: Secondary | ICD-10-CM

## 2012-11-27 DIAGNOSIS — S20219A Contusion of unspecified front wall of thorax, initial encounter: Secondary | ICD-10-CM

## 2012-11-27 DIAGNOSIS — Z8739 Personal history of other diseases of the musculoskeletal system and connective tissue: Secondary | ICD-10-CM | POA: Insufficient documentation

## 2012-11-27 MED ORDER — NAPROXEN 500 MG PO TABS: 500.0000 mg | ORAL_TABLET | Freq: Two times a day (BID) | ORAL | Status: DC

## 2012-11-27 MED ORDER — OXYCODONE-ACETAMINOPHEN 7.5-325 MG PO TABS: 1.0000 | ORAL_TABLET | ORAL | Status: DC | PRN

## 2012-11-27 MED ORDER — IBUPROFEN 800 MG PO TABS
800.0000 mg | ORAL_TABLET | Freq: Once | ORAL | Status: DC
  Filled 2012-11-27: qty 1
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
## 2013-02-13 ENCOUNTER — Encounter (HOSPITAL_COMMUNITY): Payer: Self-pay | Admitting: Emergency Medicine

## 2013-02-13 ENCOUNTER — Emergency Department (HOSPITAL_COMMUNITY)
Admission: EM | Admit: 2013-02-13 | Discharge: 2013-02-13 | Discharge status: Against Medical Advice | Payer: Self-pay | Attending: Emergency Medicine | Admitting: Emergency Medicine

## 2013-02-13 DIAGNOSIS — M2011 Hallux valgus (acquired), right foot: Secondary | ICD-10-CM

## 2013-02-13 DIAGNOSIS — M2010 Hallux valgus (acquired), unspecified foot: Secondary | ICD-10-CM | POA: Insufficient documentation

## 2013-02-13 DIAGNOSIS — M21619 Bunion of unspecified foot: Secondary | ICD-10-CM | POA: Insufficient documentation

## 2013-02-13 DIAGNOSIS — X58XXXS Exposure to other specified factors, sequela: Secondary | ICD-10-CM | POA: Insufficient documentation

## 2013-02-13 DIAGNOSIS — S8290XS Unspecified fracture of unspecified lower leg, sequela: Secondary | ICD-10-CM | POA: Insufficient documentation

## 2013-02-13 NOTE — ED Notes (Signed)
Pt has a large bunion on his right foot which he says is due to a traumatic injury seven years ago. He points to a rash around the bunion and between toes as the reason for coming to the ED today.

## 2013-02-13 NOTE — ED Notes (Signed)
Pt not in room. Apparently left ED.

## 2013-02-13 NOTE — ED Provider Notes (Signed)
Medical screening examination/treatment/procedure(s) were performed by non-physician practitioner and as supervising physician I was immediately available for consultation/collaboration.  Seniah Lawrence, MD 02/13/13 1342 

## 2013-02-13 NOTE — ED Notes (Signed)
Pt. Stated, rt. Foot pain for 3 weeks.
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
## 2013-03-06 ENCOUNTER — Emergency Department (HOSPITAL_COMMUNITY)
Admission: EM | Admit: 2013-03-06 | Discharge: 2013-03-06 | Disposition: A | Discharge status: Home / Self Care | Payer: Self-pay | Attending: Emergency Medicine | Admitting: Emergency Medicine

## 2013-03-06 ENCOUNTER — Encounter (HOSPITAL_COMMUNITY): Payer: Self-pay | Admitting: *Deleted

## 2013-03-06 ENCOUNTER — Encounter (HOSPITAL_COMMUNITY): Payer: Self-pay | Admitting: Emergency Medicine

## 2013-03-06 DIAGNOSIS — Z8739 Personal history of other diseases of the musculoskeletal system and connective tissue: Secondary | ICD-10-CM | POA: Insufficient documentation

## 2013-03-06 DIAGNOSIS — F17200 Nicotine dependence, unspecified, uncomplicated: Secondary | ICD-10-CM | POA: Insufficient documentation

## 2013-03-06 DIAGNOSIS — Z8659 Personal history of other mental and behavioral disorders: Secondary | ICD-10-CM | POA: Insufficient documentation

## 2013-03-06 DIAGNOSIS — R109 Unspecified abdominal pain: Secondary | ICD-10-CM | POA: Insufficient documentation

## 2013-03-06 DIAGNOSIS — K5900 Constipation, unspecified: Secondary | ICD-10-CM | POA: Diagnosis present

## 2013-03-06 DIAGNOSIS — Z79899 Other long term (current) drug therapy: Secondary | ICD-10-CM | POA: Insufficient documentation

## 2013-03-06 DIAGNOSIS — R609 Edema, unspecified: Secondary | ICD-10-CM | POA: Insufficient documentation

## 2013-03-06 MED ORDER — GLYCERIN (LAXATIVE) 2.1 G RE SUPP
1.0000 | Freq: Once | RECTAL | Status: AC
  Administered 2013-03-06: 09:00:00 via RECTAL
  Filled 2013-03-06: qty 1

## 2013-03-06 MED ORDER — FLEET ENEMA 7-19 GM/118ML RE ENEM
1.0000 | ENEMA | Freq: Once | RECTAL | Status: DC
  Filled 2013-03-06: qty 1

## 2013-03-06 MED ORDER — POLYETHYLENE GLYCOL 3350 17 GM/SCOOP PO POWD: 17.0000 g | Freq: Every day | ORAL | Status: DC

## 2013-03-06 MED ORDER — DOCUSATE SODIUM 100 MG PO CAPS
100.0000 mg | ORAL_CAPSULE | Freq: Once | ORAL | Status: AC
  Administered 2013-03-06: 100 mg via ORAL
  Filled 2013-03-06: qty 1

## 2013-03-06 NOTE — ED Notes (Signed)
Patient is alert and oriented x3.  He is complaining of constipation that started 2 days ago.  He states that he feels like he needs to have a large BM but it hurts to push.

## 2013-03-06 NOTE — ED Notes (Signed)
Pt states he is constipated, has not had a BM in two days, "feels like it is too big to come out" "I think I need an enema"

## 2013-03-06 NOTE — ED Notes (Signed)
Patient is alert and oriented x3.  He was given DC instructions and follow up visit instructions.  Patient gave verbal understanding.  He was DC ambulatory under his own power to home.  V/S stable.  He was not showing any signs of distress on DC 

## 2013-03-06 NOTE — ED Notes (Signed)
Pt was just seen yesterday for constipation states he could not get Miralax filled.

## 2013-03-06 NOTE — ED Notes (Signed)
Pt complains of "contipation and it's not getting better" Pt reports to being seen here for the same yesterday and was given a prescription for Miralax but did not fill it.

## 2013-03-06 NOTE — ED Provider Notes (Signed)
CSN: [id number]     Arrival date & time 03/06/13  0865 History   First MD Initiated Contact with Patient 03/06/13 0750     Chief Complaint  Patient presents with  . Constipation   (Consider location/radiation/quality/duration/timing/severity/associated sxs/prior Treatment) Patient is a 37 y.o. male presenting with constipation. The history is provided by the patient.  Constipation Severity:  Mild Time since last bowel movement:  3 days Timing:  Intermittent Progression:  Unchanged Chronicity:  Recurrent Stool description:  None produced Relieved by:  Nothing Worsened by:  Nothing tried Ineffective treatments:  None tried Associated symptoms: abdominal pain (mild abd cramping during straining)   Associated symptoms: no diarrhea, no dysuria, no fever, no nausea and no vomiting     Past Medical History  Diagnosis Date  . Anxiety   . Hallux valgus with bunions    Past Surgical History  Procedure Laterality Date  . Fatty tumor removed from left foot     No family history on file. History  Substance Use Topics  . Smoking status: Current Every Day Smoker -- 0.50 packs/day    Types: Cigarettes  . Smokeless tobacco: Not on file  . Alcohol Use: No    Review of Systems  Constitutional: Negative for fever.  HENT: Negative for rhinorrhea, drooling and neck pain.   Eyes: Negative for pain.  Respiratory: Negative for cough and shortness of breath.   Cardiovascular: Negative for chest pain and leg swelling.  Gastrointestinal: Positive for abdominal pain (mild abd cramping during straining) and constipation. Negative for nausea, vomiting and diarrhea.  Genitourinary: Negative for dysuria and hematuria.  Musculoskeletal: Negative for gait problem.  Skin: Negative for color change.  Neurological: Negative for numbness and headaches.  Hematological: Negative for adenopathy.  Psychiatric/Behavioral: Negative for behavioral problems.  All other systems reviewed and are  negative.    Allergies  Review of patient's allergies indicates no known allergies.  Home Medications   Current Outpatient Rx  Name  Route  Sig  Dispense  Refill  . polyethylene glycol powder (GLYCOLAX/MIRALAX) powder   Oral   Take 17 g by mouth daily.   255 g   0    BP 133/66  Pulse 81  Temp(Src) 97.8 F (36.6 C) (Oral)  Resp 18  SpO2 100% Physical Exam  Nursing note and vitals reviewed. Constitutional: He is oriented to person, place, and time. He appears well-developed and well-nourished.  HENT:  Head: Normocephalic and atraumatic.  Right Ear: External ear normal.  Left Ear: External ear normal.  Nose: Nose normal.  Mouth/Throat: Oropharynx is clear and moist. No oropharyngeal exudate.  Eyes: Conjunctivae and EOM are normal. Pupils are equal, round, and reactive to light.  Neck: Normal range of motion. Neck supple.  Cardiovascular: Normal rate, regular rhythm, normal heart sounds and intact distal pulses.  Exam reveals no gallop and no friction rub.   No murmur heard. Pulmonary/Chest: Effort normal and breath sounds normal. No respiratory distress. He has no wheezes.  Abdominal: Soft. Bowel sounds are normal. He exhibits no distension. There is no tenderness. There is no rebound and no guarding.  Musculoskeletal: Normal range of motion. He exhibits no edema and no tenderness.  Neurological: He is alert and oriented to person, place, and time.  Skin: Skin is warm and dry.  Psychiatric: He has a normal mood and affect. His behavior is normal.    ED Course  Procedures (including critical care time) Labs Review Labs Reviewed - No data to display Imaging Review No  results found.  MDM   1. Constipation    8:00 AM 37 y.o. male pw constipation and straining w/ attempted defecation x 3 days. Pt AFVSS here, abd soft and benign. No assoc sx. Seen here several hrs ago, does not have money for otc meds. Will give po meds here and d/c home.   8:02 AM: Discussed options  w/ pt. He insists he would like suppository. Will rec miralax as previously recommended when pt can afford it. I have discussed the diagnosis/risks/treatment options with the patient and believe the pt to be eligible for discharge home to follow-up with pcp as needed. We also discussed returning to the ED immediately if new or worsening sx occur. We discussed the sx which are most concerning (e.g., abd pain, fever, vomiting) that necessitate immediate return. Any new prescriptions provided to the patient are listed below.  New Prescriptions   No medications on file     Junius Argyle, MD 03/06/13 1931

## 2013-03-06 NOTE — ED Provider Notes (Signed)
CSN: [id number]     Arrival date & time 03/06/13  0108 History   First MD Initiated Contact with Patient 03/06/13 0210     Chief Complaint  Patient presents with  . Constipation   (Consider location/radiation/quality/duration/timing/severity/associated sxs/prior Treatment) HPI Comments: Patient presents today with a chief complaint of constipation.  He is requesting an enema.  He states that he has had constipation in the past and that an enema typically works for him.  He reports that his last BM was two days ago, but was hard in consistency.  He denies any blood in his stool.   Denies abdominal pain or rectal pain.  Denies nausea or vomiting.  Denies fever or chills.  He has not tried anything for his constipation prior to arrival.  The history is provided by the patient.    Past Medical History  Diagnosis Date  . Anxiety   . Hallux valgus with bunions    Past Surgical History  Procedure Laterality Date  . Fatty tumor removed from left foot     No family history on file. History  Substance Use Topics  . Smoking status: Current Every Day Smoker -- 0.50 packs/day    Types: Cigarettes  . Smokeless tobacco: Not on file  . Alcohol Use: No    Review of Systems  Gastrointestinal: Positive for constipation.  All other systems reviewed and are negative.    Allergies  Review of patient's allergies indicates no known allergies.  Home Medications  No current outpatient prescriptions on file. BP 113/66  Pulse 75  Temp(Src) 98.1 F (36.7 C) (Oral)  Resp 18  SpO2 98% Physical Exam  Nursing note and vitals reviewed. Constitutional: He appears well-developed and well-nourished. No distress.  HENT:  Head: Normocephalic and atraumatic.  Neck: Normal range of motion. Neck supple.  Cardiovascular: Normal rate, regular rhythm and normal heart sounds.   Pulmonary/Chest: Effort normal and breath sounds normal.  Abdominal: Soft. Bowel sounds are normal. He exhibits no distension and  no mass. There is no tenderness. There is no rebound and no guarding.  Musculoskeletal: Normal range of motion.  Neurological: He is alert.  Skin: Skin is warm and dry. He is not diaphoretic.  Psychiatric: He has a normal mood and affect.    ED Course  Procedures (including critical care time) Labs Review Labs Reviewed - No data to display Imaging Review No results found.  MDM  No diagnosis found. Patient presenting with constipation.  No abdominal pain.  No vomiting.  NO history of abdominal surgeries.  Therefore, feel that bowel obstruction is unlikely.  Patient given enema in the ED and discharged home with Miralax.    Pascal Lux [place], PA-C 03/07/13 1651

## 2013-03-07 NOTE — ED Provider Notes (Signed)
Medical screening examination/treatment/procedure(s) were performed by non-physician practitioner and as supervising physician I was immediately available for consultation/collaboration.  Sunnie Nielsen, MD 03/07/13 2136

## 2013-03-14 ENCOUNTER — Emergency Department (HOSPITAL_COMMUNITY): Payer: Self-pay

## 2013-03-14 ENCOUNTER — Encounter (HOSPITAL_COMMUNITY): Payer: Self-pay | Admitting: Emergency Medicine

## 2013-03-14 ENCOUNTER — Emergency Department (HOSPITAL_COMMUNITY)
Admission: EM | Admit: 2013-03-14 | Discharge: 2013-03-14 | Disposition: A | Discharge status: Home / Self Care | Payer: Self-pay | Attending: Emergency Medicine | Admitting: Emergency Medicine

## 2013-03-14 ENCOUNTER — Emergency Department (HOSPITAL_COMMUNITY)
Admission: EM | Admit: 2013-03-14 | Discharge: 2013-03-14 | Disposition: A | Discharge status: Home / Self Care | Payer: No Typology Code available for payment source | Attending: Emergency Medicine | Admitting: Emergency Medicine

## 2013-03-14 DIAGNOSIS — Z9889 Other specified postprocedural states: Secondary | ICD-10-CM | POA: Insufficient documentation

## 2013-03-14 DIAGNOSIS — W172XXA Fall into hole, initial encounter: Secondary | ICD-10-CM | POA: Insufficient documentation

## 2013-03-14 DIAGNOSIS — Y939 Activity, unspecified: Secondary | ICD-10-CM | POA: Insufficient documentation

## 2013-03-14 DIAGNOSIS — Z8659 Personal history of other mental and behavioral disorders: Secondary | ICD-10-CM | POA: Insufficient documentation

## 2013-03-14 DIAGNOSIS — Y929 Unspecified place or not applicable: Secondary | ICD-10-CM | POA: Insufficient documentation

## 2013-03-14 DIAGNOSIS — W19XXXA Unspecified fall, initial encounter: Secondary | ICD-10-CM | POA: Insufficient documentation

## 2013-03-14 DIAGNOSIS — G8929 Other chronic pain: Secondary | ICD-10-CM | POA: Insufficient documentation

## 2013-03-14 DIAGNOSIS — Y9301 Activity, walking, marching and hiking: Secondary | ICD-10-CM | POA: Insufficient documentation

## 2013-03-14 DIAGNOSIS — F17200 Nicotine dependence, unspecified, uncomplicated: Secondary | ICD-10-CM | POA: Insufficient documentation

## 2013-03-14 DIAGNOSIS — S93409A Sprain of unspecified ligament of unspecified ankle, initial encounter: Secondary | ICD-10-CM | POA: Insufficient documentation

## 2013-03-14 DIAGNOSIS — Z79899 Other long term (current) drug therapy: Secondary | ICD-10-CM | POA: Insufficient documentation

## 2013-03-14 MED ORDER — OXYCODONE-ACETAMINOPHEN 5-325 MG PO TABS
1.0000 | ORAL_TABLET | Freq: Once | ORAL | Status: AC
  Administered 2013-03-14: 1 via ORAL
  Filled 2013-03-14: qty 1

## 2013-03-14 MED ORDER — OXYCODONE-ACETAMINOPHEN 5-325 MG PO TABS: 1.0000 | ORAL_TABLET | Freq: Four times a day (QID) | ORAL | Status: DC | PRN

## 2013-03-14 MED ORDER — IBUPROFEN 800 MG PO TABS: 800.0000 mg | ORAL_TABLET | Freq: Three times a day (TID) | ORAL | Status: DC

## 2013-03-14 MED ORDER — TRAMADOL HCL 50 MG PO TABS: 50.0000 mg | ORAL_TABLET | Freq: Four times a day (QID) | ORAL | Status: DC | PRN

## 2013-03-14 NOTE — ED Notes (Addendum)
Pt presents to the Ed with a complaint of ankle pain.  Pt fell on Monday and hurt his ankle.  Pt went to the [facility] and was seen.  Pt states medicines prescribed are ineffective.  Pt states his right ankle is hurting.  Pt right ankle demonstrates slight swelling.

## 2013-03-14 NOTE — ED Provider Notes (Signed)
CSN: [id number]     Arrival date & time 03/14/13  0258 History   First MD Initiated Contact with Patient 03/14/13 [phone]     Chief Complaint  Patient presents with  . Ankle Pain   HPI  History provided by the patient. Patient is 37 year old male who presents with complaints of right foot and ankle injury. Patient states that he was walking when he suddenly fell through the floor with his right leg going all the way into the floor. Patient states he had to be helped and pulled out of the ground and has had pain and swelling to his ankle and leg since then. He did take one ibuprofen initially without any changes in symptoms. He was transported by EMS with a brace to his foot. No other treatments provided. Pain is worse with any pressure or movements. Pain is a 10 out of 10. Patient does report prior injuries to the same foot and ankle and states that he has caught his leg in the grinder in the past with previous fractures. She does report some chronic issues of pain and swelling to the ankle. No other aggravating or alleviating factors. No other associated symptoms.    Past Medical History  Diagnosis Date  . Anxiety   . Hallux valgus with bunions    Past Surgical History  Procedure Laterality Date  . Fatty tumor removed from left foot     History reviewed. No pertinent family history. History  Substance Use Topics  . Smoking status: Current Every Day Smoker -- 0.50 packs/day    Types: Cigarettes  . Smokeless tobacco: Not on file  . Alcohol Use: No    Review of Systems  Neurological: Negative for weakness and numbness.  All other systems reviewed and are negative.    Allergies  Review of patient's allergies indicates no known allergies.  Home Medications   Current Outpatient Rx  Name  Route  Sig  Dispense  Refill  . polyethylene glycol powder (GLYCOLAX/MIRALAX) powder   Oral   Take 17 g by mouth daily.   255 g   0    BP 159/87  Pulse 76  Temp(Src) 97.8 F (36.6 C)  (Oral)  Resp 20  SpO2 97% Physical Exam  Nursing note and vitals reviewed. Constitutional: He is oriented to person, place, and time. He appears well-developed and well-nourished. No distress.  HENT:  Head: Normocephalic.  Cardiovascular: Normal rate and regular rhythm.   No murmur heard. Pulmonary/Chest: Effort normal and breath sounds normal. No respiratory distress. He has no wheezes. He has no rales.  Abdominal: Soft.  Musculoskeletal:  Reduced range of motion of right ankle secondary to pain and swelling. There is mild to moderate swelling greatest over the lateral malleolus area. No gross deformities. There is a slight contusion more proximally along the fibula area without deformity. Slight erythematous marks along the lateral leg. No sniffing breaks in the skin or lacerations. Normal dorsal pedal pulses and capillary refill in toes.  Neurological: He is alert and oriented to person, place, and time.  Skin: Skin is warm.  Psychiatric: He has a normal mood and affect. His behavior is normal.    ED Course  Procedures    Imaging Review Dg Tibia/fibula Right  03/14/2013   *RADIOLOGY REPORT*  Clinical Data: Ankle pain after fall.  RIGHT TIBIA AND FIBULA - 2 VIEW  Comparison: None.  Findings: The right tibia and fibula appear intact.  No displaced fractures are identified.  No focal bone lesion  or bone destruction.  No radiopaque soft tissue foreign bodies.  IMPRESSION: No acute fractures demonstrated in the right lower leg.   Original Report Authenticated By: Burman Nieves, M.D.   Dg Ankle Complete Right  03/14/2013   *RADIOLOGY REPORT*  Clinical Data: Right ankle pain after fall.  RIGHT ANKLE - COMPLETE 3+ VIEW  Comparison: 08/31/2012  Findings: Mild lateral soft tissue swelling about the right ankle. No evidence of acute fracture or subluxation.  No focal bone lesion or bone destruction.  Bone cortex and trabecular architecture appear intact.  No significant change since previous  study.  IMPRESSION: Mild soft tissue swelling laterally.  No acute bony abnormalities demonstrated in the right ankle.   Original Report Authenticated By: Burman Nieves, M.D.    MDM   1. Ankle sprain and strain, right, initial encounter     Patient seen and evaluated. Patient appears well does not appear in acute distress or significant discomfort.  X-rays reviewed. No signs of broken bones or other concerning injury. There is mild swelling indicating possible sprain. Discussed findings with the patient and treatment plan. Initially prescriptions for tramadol and ibuprofen provided however patient states only Percocet work for his pains. He has had multiple visits for similar complaints of right ankle and foot pains with chronic history of pain. This time I told him I would not prescribe narcotics for his symptoms today and he can followup with a primary care provider or with orthopedic specialist for continued treatments.    Angus Seller, PA-C 03/14/13 [phone]

## 2013-03-14 NOTE — ED Notes (Signed)
Bed: ZO10 Expected date: 03/14/13 Expected time: 2:45 AM Means of arrival: Ambulance Comments: Fall through floor, leg swelling

## 2013-03-14 NOTE — ED Provider Notes (Signed)
Medical screening examination/treatment/procedure(s) were performed by non-physician practitioner and as supervising physician I was immediately available for consultation/collaboration.  Emanual Lamountain M Stetson Pelaez, MD 03/14/13 0557 

## 2013-03-14 NOTE — ED Provider Notes (Signed)
CSN: [id number]     Arrival date & time 03/14/13  1846 History   First MD Initiated Contact with Patient 03/14/13 1932     Chief Complaint  Patient presents with  . Ankle Pain   (Consider location/radiation/quality/duration/timing/severity/associated sxs/prior Treatment) HPI  Robert Kramer is a 37 y.o.male without any significant PMH presents to the ER with complaints of ankle pain. He was seen in the ED yesterday for ankle injury and sent home with Ultram and was told that he did not have a fracture. He endorses the pain being so severe he has not been able to sleep. He denies repeat injury, says he tried the medication prescribed by it has not touched the pain. He has a history of previous injury to that foot requiring numerous surgeries which has left him with some mild deformity to the foot.    Past Medical History  Diagnosis Date  . Anxiety   . Hallux valgus with bunions    Past Surgical History  Procedure Laterality Date  . Fatty tumor removed from left foot     History reviewed. No pertinent family history. History  Substance Use Topics  . Smoking status: Current Every Day Smoker -- 0.50 packs/day    Types: Cigarettes  . Smokeless tobacco: Not on file  . Alcohol Use: No    Review of Systems ROS is negative unless otherwise stated in the HPI   Allergies  Tramadol  Home Medications   Current Outpatient Rx  Name  Route  Sig  Dispense  Refill  . oxyCODONE-acetaminophen (PERCOCET/ROXICET) 5-325 MG per tablet   Oral   Take 1 tablet by mouth every 6 (six) hours as needed for pain.   15 tablet   0    BP 126/84  Pulse 62  Temp(Src) 98.1 F (36.7 C) (Oral)  Resp 16  SpO2 100% Physical Exam  Nursing note and vitals reviewed. Constitutional: He appears well-developed and well-nourished. No distress.  HENT:  Head: Normocephalic and atraumatic.  Eyes: Pupils are equal, round, and reactive to light.  Neck: Normal range of motion. Neck supple.  Cardiovascular:  Normal rate and regular rhythm.   Pulmonary/Chest: Effort normal.  Abdominal: Soft.  Musculoskeletal:       Right ankle: He exhibits decreased range of motion, swelling and ecchymosis. He exhibits no laceration and normal pulse. Tenderness. Lateral malleolus tenderness found. Achilles tendon normal.  Neurological: He is alert.  Skin: Skin is warm and dry.    ED Course  Procedures (including critical care time) Labs Review Labs Reviewed - No data to display Imaging Review Dg Tibia/fibula Right  03/14/2013   *RADIOLOGY REPORT*  Clinical Data: Ankle pain after fall.  RIGHT TIBIA AND FIBULA - 2 VIEW  Comparison: None.  Findings: The right tibia and fibula appear intact.  No displaced fractures are identified.  No focal bone lesion or bone destruction.  No radiopaque soft tissue foreign bodies.  IMPRESSION: No acute fractures demonstrated in the right lower leg.   Original Report Authenticated By: Burman Nieves, M.D.   Dg Ankle Complete Right  03/14/2013   *RADIOLOGY REPORT*  Clinical Data: Right ankle pain after fall.  RIGHT ANKLE - COMPLETE 3+ VIEW  Comparison: 08/31/2012  Findings: Mild lateral soft tissue swelling about the right ankle. No evidence of acute fracture or subluxation.  No focal bone lesion or bone destruction.  Bone cortex and trabecular architecture appear intact.  No significant change since previous study.  IMPRESSION: Mild soft tissue swelling laterally.  No  acute bony abnormalities demonstrated in the right ankle.   Original Report Authenticated By: Burman Nieves, M.D.    MDM   1. Ankle sprain and strain, left, initial encounter    Tramadol isnt working for patient. He has obvious scars from previous surgeries and signifcant swelling to his ankle. The Percocet worked yesterday in the ED. He requests referral to Podiatrist and stronger pain meds. Rx: Percocet #15. Looked patient up on [facility] database and does not have multiple prescriptions.  37 y.o.Robert Kramer's  evaluation in the Emergency Department is complete. It has been determined that no acute conditions requiring further emergency intervention are present at this time. The patient/guardian have been advised of the diagnosis and plan. We have discussed signs and symptoms that warrant return to the ED, such as changes or worsening in symptoms.  Vital signs are stable at discharge. Filed Vitals:   03/14/13 1915  BP: 126/84  Pulse: 62  Temp: 98.1 F (36.7 C)  Resp: 16    Patient/guardian has voiced understanding and agreed to follow-up with the PCP or specialist.  .    Dorthula Matas, PA-C 03/16/13 1407

## 2013-03-14 NOTE — ED Notes (Signed)
Patient transported to X-ray 

## 2013-03-14 NOTE — ED Notes (Signed)
Per EMS, pt found with foot thru floor to his knee.

## 2013-03-14 NOTE — ED Notes (Signed)
Pt complaining of pain, swelling to the right ankle noted

## 2013-03-23 NOTE — ED Provider Notes (Signed)
Medical screening examination/treatment/procedure(s) were performed by non-physician practitioner and as supervising physician I was immediately available for consultation/collaboration.   Enid Skeens, MD 03/23/13 [phone]

## 2013-03-30 ENCOUNTER — Emergency Department (HOSPITAL_COMMUNITY)
Admission: EM | Admit: 2013-03-30 | Discharge: 2013-03-31 | Disposition: A | Discharge status: Other Acute Inpatient Hospital | Payer: No Typology Code available for payment source | Attending: Emergency Medicine | Admitting: Emergency Medicine

## 2013-03-30 ENCOUNTER — Encounter (HOSPITAL_COMMUNITY): Payer: Self-pay | Admitting: Emergency Medicine

## 2013-03-30 DIAGNOSIS — F1010 Alcohol abuse, uncomplicated: Secondary | ICD-10-CM

## 2013-03-30 DIAGNOSIS — Z0289 Encounter for other administrative examinations: Secondary | ICD-10-CM | POA: Insufficient documentation

## 2013-03-30 DIAGNOSIS — F411 Generalized anxiety disorder: Secondary | ICD-10-CM | POA: Insufficient documentation

## 2013-03-30 DIAGNOSIS — F17200 Nicotine dependence, unspecified, uncomplicated: Secondary | ICD-10-CM | POA: Insufficient documentation

## 2013-03-30 DIAGNOSIS — F319 Bipolar disorder, unspecified: Secondary | ICD-10-CM | POA: Insufficient documentation

## 2013-03-30 HISTORY — DX: Major depressive disorder, single episode, unspecified: F32.9

## 2013-03-30 HISTORY — DX: Emotional lability: R45.86

## 2013-03-30 HISTORY — DX: Depression, unspecified: F32.A

## 2013-03-30 HISTORY — DX: Bipolar disorder, unspecified: F31.9

## 2013-03-30 NOTE — ED Provider Notes (Signed)
This chart was scribed for Robert Filter NP, a non-physician practitioner working with No att. providers found by Lewanda Rife, ED Scribe. This patient was seen in room WOTF/NONE and the patient's care was started at 2346.    CSN: [id number]     Arrival date & time 03/30/13  2252 History   First MD Initiated Contact with Patient 03/30/13 2339     Chief Complaint  Patient presents with  . Medical Clearance   (Consider location/radiation/quality/duration/timing/severity/associated sxs/prior Treatment) The history is provided by the patient.   HPI Comments: Robert Kramer is a 37 y.o. male who presents to the Emergency Department complaining of worsening alcoholism and anger onset last month. Reports he is drinking 1/2 gallon of liquor a months. Reports associated HI towards 2 specific people only. Reports having a plan for his HI. Denies SI, auditory and visual hallucinations. Reports he has a plan for HI. Reports illicit drugs use with pills, and marijuana.  Past Medical History  Diagnosis Date  . Anxiety   . Hallux valgus with bunions   . Bipolar 1 disorder   . Mood swings   . Depression    Past Surgical History  Procedure Laterality Date  . Fatty tumor removed from left foot     History reviewed. No pertinent family history. History  Substance Use Topics  . Smoking status: Current Every Day Smoker -- 0.50 packs/day    Types: Cigarettes  . Smokeless tobacco: Not on file  . Alcohol Use: Yes     Comment: heavy     Review of Systems  Psychiatric/Behavioral: Positive for decreased concentration. Negative for suicidal ideas.  All other systems reviewed and are negative.  A complete 10 system review of systems was obtained and all systems are negative except as noted in the HPI and PMH.     Allergies  Tramadol  Home Medications  No current outpatient prescriptions on file. BP 120/72  Pulse 88  Temp(Src) 97.9 F (36.6 C) (Oral)  Resp 16  Ht 5\' 11"  (1.803 m)  Wt  170 lb (77.111 kg)  BMI 23.72 kg/m2  SpO2 98% Physical Exam  Nursing note and vitals reviewed. Constitutional: He is oriented to person, place, and time. He appears well-developed and well-nourished. No distress.  HENT:  Head: Normocephalic and atraumatic.  Eyes: EOM are normal.  Neck: Neck supple. No tracheal deviation present.  Cardiovascular: Normal rate.   Pulmonary/Chest: Effort normal. No respiratory distress.  Musculoskeletal: Normal range of motion.  Neurological: He is alert and oriented to person, place, and time.  Skin: Skin is warm and dry.  Psychiatric: His behavior is normal. His affect is angry. He exhibits a depressed mood. He expresses homicidal ideation. He expresses no suicidal ideation. He expresses homicidal plans. He expresses no suicidal plans.    ED Course  Procedures (including critical care time) Labs Review Labs Reviewed  ETHANOL - Abnormal; Notable for the following:    Alcohol, Ethyl (B) 100 (*)    All other components within normal limits  CBC WITH DIFFERENTIAL - Abnormal; Notable for the following:    Platelets 136 (*)    All other components within normal limits  COMPREHENSIVE METABOLIC PANEL - Abnormal; Notable for the following:    Glucose, Bld 101 (*)    AST 59 (*)    All other components within normal limits  URINE RAPID DRUG SCREEN (HOSP PERFORMED) - Abnormal; Notable for the following:    Cocaine POSITIVE (*)    Tetrahydrocannabinol POSITIVE (*)  All other components within normal limits   Imaging Review No results found.  MDM   1. Alcohol abuse     Medical screening labs reviewed patient is positive for alcohol with a blood level of 100 history screen is positive for cocaine, and marijuana     Robert Filter, NP 03/31/13 2046

## 2013-03-30 NOTE — ED Notes (Signed)
PT CHANGED IN TO [facility] [facility]. PT SEARCHED AND WANDED BY SECURITY

## 2013-03-30 NOTE — ED Notes (Signed)
Pt states he has been drinking anywhere from a fifth to a half gallon a day for the past month  Pt states he just got out of [place] about 3 mths ago  Pt states him and his girlfriend broke up about a month ago and he has been off his meds for about a month as well  Pt states he has been in jail off and on for most of his life  Pt states he has been having thoughts of harming himself and others  Pt is calm and cooperative in triage at this time

## 2013-03-30 NOTE — ED Notes (Signed)
FNP at bedside.

## 2013-03-31 ENCOUNTER — Encounter (HOSPITAL_COMMUNITY): Payer: Self-pay | Admitting: *Deleted

## 2013-03-31 ENCOUNTER — Inpatient Hospital Stay (HOSPITAL_COMMUNITY)
Admission: AD | Admit: 2013-03-31 | Payer: No Typology Code available for payment source | Source: Intra-hospital | Admitting: Psychiatry

## 2013-03-31 ENCOUNTER — Inpatient Hospital Stay (HOSPITAL_COMMUNITY)
Admission: AD | Admit: 2013-03-31 | Discharge: 2013-04-03 | DRG: 897 | Disposition: A | Discharge status: Home / Self Care | Payer: No Typology Code available for payment source | Source: Intra-hospital | Attending: Emergency Medicine | Admitting: Emergency Medicine

## 2013-03-31 ENCOUNTER — Encounter (HOSPITAL_COMMUNITY): Payer: Self-pay

## 2013-03-31 DIAGNOSIS — K5900 Constipation, unspecified: Secondary | ICD-10-CM

## 2013-03-31 DIAGNOSIS — F4324 Adjustment disorder with disturbance of conduct: Secondary | ICD-10-CM

## 2013-03-31 DIAGNOSIS — F314 Bipolar disorder, current episode depressed, severe, without psychotic features: Secondary | ICD-10-CM

## 2013-03-31 DIAGNOSIS — Z79899 Other long term (current) drug therapy: Secondary | ICD-10-CM

## 2013-03-31 DIAGNOSIS — F1020 Alcohol dependence, uncomplicated: Secondary | ICD-10-CM

## 2013-03-31 DIAGNOSIS — F3130 Bipolar disorder, current episode depressed, mild or moderate severity, unspecified: Secondary | ICD-10-CM | POA: Diagnosis present

## 2013-03-31 DIAGNOSIS — S9031XA Contusion of right foot, initial encounter: Secondary | ICD-10-CM

## 2013-03-31 DIAGNOSIS — R45850 Homicidal ideations: Secondary | ICD-10-CM

## 2013-03-31 DIAGNOSIS — F602 Antisocial personality disorder: Secondary | ICD-10-CM | POA: Diagnosis present

## 2013-03-31 LAB — RAPID URINE DRUG SCREEN, HOSP PERFORMED
Amphetamines: NOT DETECTED
Benzodiazepines: NOT DETECTED
Opiates: NOT DETECTED
Tetrahydrocannabinol: POSITIVE — AB

## 2013-03-31 LAB — COMPREHENSIVE METABOLIC PANEL
ALT: 49 U/L (ref 0–53)
Albumin: 3.7 g/dL (ref 3.5–5.2)
Alkaline Phosphatase: 94 U/L (ref 39–117)
BUN: 9 mg/dL (ref 6–23)
Calcium: 9.1 mg/dL (ref 8.4–10.5)
GFR calc Af Amer: >90 mL/min (ref >90)
Potassium: 3.8 mEq/L (ref 3.5–5.1)
Sodium: 137 mEq/L (ref 135–145)
Total Protein: 7.5 g/dL (ref 6.0–8.3)

## 2013-03-31 LAB — CBC WITH DIFFERENTIAL/PLATELET
Basophils Relative: 0 % (ref 0–1)
Eosinophils Absolute: 0.1 10*3/uL (ref 0.0–0.7)
Eosinophils Relative: 1 % (ref 0–5)
MCH: 31.5 pg (ref 26.0–34.0)
MCHC: 34.4 g/dL (ref 30.0–36.0)
MCV: 91.7 fL (ref 78.0–100.0)
Neutrophils Relative %: 62 % (ref 43–77)
Platelets: 136 10*3/uL — ABNORMAL LOW (ref 150–400)

## 2013-03-31 MED ORDER — ALUM & MAG HYDROXIDE-SIMETH 200-200-20 MG/5ML PO SUSP: 30.0000 mL | ORAL | Status: DC | PRN

## 2013-03-31 MED ORDER — THIAMINE HCL 100 MG/ML IJ SOLN: 100.0000 mg | Freq: Every day | INTRAMUSCULAR | Status: DC

## 2013-03-31 MED ORDER — LORAZEPAM 1 MG PO TABS
2.0000 mg | ORAL_TABLET | Freq: Three times a day (TID) | ORAL | Status: DC | PRN
  Administered 2013-03-31 – 2013-04-03 (×8): 2 mg via ORAL
  Filled 2013-03-31 (×8): qty 2

## 2013-03-31 MED ORDER — VITAMIN B-1 100 MG PO TABS
100.0000 mg | ORAL_TABLET | Freq: Every day | ORAL | Status: DC
  Administered 2013-04-01 – 2013-04-03 (×3): 100 mg via ORAL
  Filled 2013-03-31 (×5): qty 1

## 2013-03-31 MED ORDER — THIAMINE HCL 100 MG/ML IJ SOLN: 100.0000 mg | Freq: Once | INTRAMUSCULAR | Status: AC

## 2013-03-31 MED ORDER — ZOLPIDEM TARTRATE 5 MG PO TABS
5.0000 mg | ORAL_TABLET | Freq: Every evening | ORAL | Status: DC | PRN
  Administered 2013-03-31: 5 mg via ORAL
  Filled 2013-03-31: qty 1

## 2013-03-31 MED ORDER — ACETAMINOPHEN 325 MG PO TABS: 650.0000 mg | ORAL_TABLET | Freq: Four times a day (QID) | ORAL | Status: DC | PRN

## 2013-03-31 MED ORDER — IBUPROFEN 600 MG PO TABS
600.0000 mg | ORAL_TABLET | Freq: Three times a day (TID) | ORAL | Status: DC | PRN
  Administered 2013-04-02: 600 mg via ORAL
  Filled 2013-03-31: qty 1

## 2013-03-31 MED ORDER — BENZTROPINE MESYLATE 1 MG/ML IJ SOLN: 1.0000 mg | Freq: Two times a day (BID) | INTRAMUSCULAR | Status: DC | PRN

## 2013-03-31 MED ORDER — CHLORDIAZEPOXIDE HCL 25 MG PO CAPS: 25.0000 mg | ORAL_CAPSULE | Freq: Every day | ORAL | Status: DC

## 2013-03-31 MED ORDER — LORAZEPAM 1 MG PO TABS
0.0000 mg | ORAL_TABLET | Freq: Four times a day (QID) | ORAL | Status: DC
  Administered 2013-03-31: 1 mg via ORAL
  Administered 2013-03-31: 2 mg via ORAL
  Filled 2013-03-31: qty 1
  Filled 2013-03-31: qty 2

## 2013-03-31 MED ORDER — LORAZEPAM 1 MG PO TABS: 1.0000 mg | ORAL_TABLET | Freq: Three times a day (TID) | ORAL | Status: DC | PRN

## 2013-03-31 MED ORDER — IBUPROFEN 200 MG PO TABS: 600.0000 mg | ORAL_TABLET | Freq: Three times a day (TID) | ORAL | Status: DC | PRN

## 2013-03-31 MED ORDER — ADULT MULTIVITAMIN W/MINERALS CH
1.0000 | ORAL_TABLET | Freq: Every day | ORAL | Status: DC
  Administered 2013-04-01 – 2013-04-03 (×3): 1 via ORAL
  Filled 2013-03-31 (×6): qty 1

## 2013-03-31 MED ORDER — MAGNESIUM HYDROXIDE 400 MG/5ML PO SUSP: 30.0000 mL | Freq: Every day | ORAL | Status: DC | PRN

## 2013-03-31 MED ORDER — CHLORDIAZEPOXIDE HCL 25 MG PO CAPS
25.0000 mg | ORAL_CAPSULE | ORAL | Status: DC
  Administered 2013-04-03: 25 mg via ORAL
  Filled 2013-03-31: qty 1

## 2013-03-31 MED ORDER — CHLORDIAZEPOXIDE HCL 25 MG PO CAPS
25.0000 mg | ORAL_CAPSULE | Freq: Three times a day (TID) | ORAL | Status: AC
  Administered 2013-04-02 (×3): 25 mg via ORAL
  Filled 2013-03-31 (×3): qty 1

## 2013-03-31 MED ORDER — HALOPERIDOL LACTATE 5 MG/ML IJ SOLN: 5.0000 mg | Freq: Three times a day (TID) | INTRAMUSCULAR | Status: DC | PRN

## 2013-03-31 MED ORDER — LORAZEPAM 1 MG PO TABS: 0.0000 mg | ORAL_TABLET | Freq: Two times a day (BID) | ORAL | Status: DC

## 2013-03-31 MED ORDER — VITAMIN B-1 100 MG PO TABS
100.0000 mg | ORAL_TABLET | Freq: Every day | ORAL | Status: DC
  Administered 2013-03-31: 100 mg via ORAL
  Filled 2013-03-31: qty 1

## 2013-03-31 MED ORDER — VITAMIN B-1 100 MG PO TABS: 100.0000 mg | ORAL_TABLET | Freq: Every day | ORAL | Status: DC

## 2013-03-31 MED ORDER — DIPHENHYDRAMINE HCL 50 MG/ML IJ SOLN: 50.0000 mg | Freq: Three times a day (TID) | INTRAMUSCULAR | Status: DC | PRN

## 2013-03-31 MED ORDER — HYDROXYZINE HCL 25 MG PO TABS
25.0000 mg | ORAL_TABLET | Freq: Four times a day (QID) | ORAL | Status: DC | PRN
  Administered 2013-03-31 – 2013-04-03 (×5): 25 mg via ORAL
  Filled 2013-03-31: qty 1

## 2013-03-31 MED ORDER — BENZTROPINE MESYLATE 1 MG PO TABS: 1.0000 mg | ORAL_TABLET | Freq: Two times a day (BID) | ORAL | Status: DC | PRN

## 2013-03-31 MED ORDER — TRAZODONE HCL 50 MG PO TABS
50.0000 mg | ORAL_TABLET | Freq: Every evening | ORAL | Status: DC | PRN
  Administered 2013-03-31 – 2013-04-02 (×3): 50 mg via ORAL
  Filled 2013-03-31 (×3): qty 1

## 2013-03-31 MED ORDER — CHLORDIAZEPOXIDE HCL 25 MG PO CAPS
25.0000 mg | ORAL_CAPSULE | Freq: Four times a day (QID) | ORAL | Status: AC
  Administered 2013-03-31 – 2013-04-01 (×6): 25 mg via ORAL
  Filled 2013-03-31 (×6): qty 1

## 2013-03-31 MED ORDER — LORAZEPAM 2 MG/ML IJ SOLN: 2.0000 mg | Freq: Three times a day (TID) | INTRAMUSCULAR | Status: DC | PRN

## 2013-03-31 MED ORDER — LOPERAMIDE HCL 2 MG PO CAPS: 2.0000 mg | ORAL_CAPSULE | ORAL | Status: DC | PRN

## 2013-03-31 MED ORDER — ONDANSETRON 4 MG PO TBDP: 4.0000 mg | ORAL_TABLET | Freq: Four times a day (QID) | ORAL | Status: DC | PRN

## 2013-03-31 MED ORDER — NICOTINE 21 MG/24HR TD PT24
21.0000 mg | MEDICATED_PATCH | Freq: Every day | TRANSDERMAL | Status: DC
  Administered 2013-03-31 – 2013-04-03 (×3): 21 mg via TRANSDERMAL
  Filled 2013-03-31 (×5): qty 1

## 2013-03-31 MED ORDER — ZOLPIDEM TARTRATE 5 MG PO TABS: 5.0000 mg | ORAL_TABLET | Freq: Every evening | ORAL | Status: DC | PRN

## 2013-03-31 MED ORDER — HALOPERIDOL 5 MG PO TABS
5.0000 mg | ORAL_TABLET | Freq: Three times a day (TID) | ORAL | Status: DC | PRN
  Administered 2013-04-02: 5 mg via ORAL
  Filled 2013-03-31: qty 1

## 2013-03-31 MED ORDER — LORAZEPAM 1 MG PO TABS: 0.0000 mg | ORAL_TABLET | Freq: Four times a day (QID) | ORAL | Status: DC

## 2013-03-31 MED ORDER — ONDANSETRON HCL 4 MG PO TABS: 4.0000 mg | ORAL_TABLET | Freq: Three times a day (TID) | ORAL | Status: DC | PRN

## 2013-03-31 MED ORDER — NICOTINE 21 MG/24HR TD PT24
21.0000 mg | MEDICATED_PATCH | Freq: Every day | TRANSDERMAL | Status: DC
  Filled 2013-03-31: qty 1

## 2013-03-31 MED ORDER — DIPHENHYDRAMINE HCL 25 MG PO CAPS: 50.0000 mg | ORAL_CAPSULE | Freq: Three times a day (TID) | ORAL | Status: DC | PRN

## 2013-03-31 MED ORDER — CHLORDIAZEPOXIDE HCL 25 MG PO CAPS: 25.0000 mg | ORAL_CAPSULE | Freq: Four times a day (QID) | ORAL | Status: DC | PRN

## 2013-03-31 NOTE — Progress Notes (Signed)
Pt here for ETOH detox and HI towards "people outside of here", pt did not want to go into detail about who and why during the admission otherwise, pt was cooperative during admission process, pt did however get agitated once brought back on the unit because he said that he left a "very improtant number" in the search room and he was upset that he was not allowed to go back off the unit to look for it.  Staff went to search room and could not find the phone number that he was talking about.  Pt denies SI/AVH, oriented pt to unit.
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
## 2013-03-31 NOTE — Progress Notes (Signed)
[facility] Group Notes:  (Nursing/MHT/Case Management/Adjunct)  Date:  03/31/2013  Time:  2000  Type of Therapy:  Psychoeducational Skills  Participation Level:  Minimal  Participation Quality:  Inattentive  Affect:  Depressed  Cognitive:  Disorganized  Insight:  Lacking  Engagement in Group:  Lacking  Modes of Intervention:  Education  Summary of Progress/Problems: The patient verbalized in group that he had a stressful morning, but his afternoon was much improved. He also verbalized in group that he suffered a few panic attacks during the daytime. His goal for tomorrow is to take his time with the hospital process.   Hazle Coca S 03/31/2013, 11:28 PM

## 2013-03-31 NOTE — ED Provider Notes (Signed)
CSN: [id number]     Arrival date & time 03/30/13  2252 History   First MD Initiated Contact with Patient 03/30/13 2339     Chief Complaint  Patient presents with  . Medical Clearance   (Consider location/radiation/quality/duration/timing/severity/associated sxs/prior Treatment) HPI  Past Medical History  Diagnosis Date  . Anxiety   . Hallux valgus with bunions   . Bipolar 1 disorder   . Mood swings   . Depression    Past Surgical History  Procedure Laterality Date  . Fatty tumor removed from left foot     History reviewed. No pertinent family history. History  Substance Use Topics  . Smoking status: Current Every Day Smoker -- 0.50 packs/day    Types: Cigarettes  . Smokeless tobacco: Not on file  . Alcohol Use: Yes     Comment: heavy     Review of Systems  Allergies  Tramadol  Home Medications  No current outpatient prescriptions on file. BP 120/72  Pulse 88  Temp(Src) 97.9 F (36.6 C) (Oral)  Resp 16  Ht 5\' 11"  (1.803 m)  Wt 170 lb (77.111 kg)  BMI 23.72 kg/m2  SpO2 98% Physical Exam  ED Course  Procedures (including critical care time) Labs Review Labs Reviewed  ETHANOL - Abnormal; Notable for the following:    Alcohol, Ethyl (B) 100 (*)    All other components within normal limits  CBC WITH DIFFERENTIAL - Abnormal; Notable for the following:    Platelets 136 (*)    All other components within normal limits  COMPREHENSIVE METABOLIC PANEL - Abnormal; Notable for the following:    Glucose, Bld 101 (*)    AST 59 (*)    All other components within normal limits  URINE RAPID DRUG SCREEN (HOSP PERFORMED) - Abnormal; Notable for the following:    Cocaine POSITIVE (*)    Tetrahydrocannabinol POSITIVE (*)    All other components within normal limits   Imaging Review No results found.  MDM   1. Alcohol abuse        [name], NP 03/31/13 2046
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
## 2013-03-31 NOTE — BH Assessment (Signed)
Patient accepted by Dr Ladona Ridgel to [facility]. Attending MD Jonalagadda, bed #501-2.

## 2013-03-31 NOTE — [facility] Counselor (Signed)
Pt has been accepted by [facility], 501-2, Dr. Elsie Saas, attending.
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
## 2013-03-31 NOTE — Progress Notes (Signed)
[facility] provided pt with a [employer] orange card application, highlighting [facility] [place].

## 2013-04-01 ENCOUNTER — Encounter (HOSPITAL_COMMUNITY): Payer: Self-pay | Admitting: *Deleted

## 2013-04-01 ENCOUNTER — Inpatient Hospital Stay (HOSPITAL_COMMUNITY): Payer: No Typology Code available for payment source

## 2013-04-01 DIAGNOSIS — F3130 Bipolar disorder, current episode depressed, mild or moderate severity, unspecified: Secondary | ICD-10-CM

## 2013-04-01 MED ORDER — LITHIUM CARBONATE 150 MG PO CAPS
150.0000 mg | ORAL_CAPSULE | Freq: Two times a day (BID) | ORAL | Status: DC
  Administered 2013-04-01 – 2013-04-03 (×4): 150 mg via ORAL
  Filled 2013-04-01 (×7): qty 1

## 2013-04-01 MED ORDER — IBUPROFEN 800 MG PO TABS
800.0000 mg | ORAL_TABLET | Freq: Once | ORAL | Status: AC
Start: 2013-04-01 — End: 2013-04-01
  Administered 2013-04-01: 800 mg via ORAL
  Filled 2013-04-01: qty 1

## 2013-04-01 MED ORDER — IBUPROFEN 800 MG PO TABS: 800.0000 mg | ORAL_TABLET | Freq: Three times a day (TID) | ORAL | Status: DC

## 2013-04-01 NOTE — Progress Notes (Signed)
Patient ID: Robert Kramer, male   DOB: [birth year], 37 y.o.   MRN: [id number] Patient arrived back onto unit. Pt at medication window cursing and agitated stating "None of those fucking doctors know what the fuck their doing! I ain't gonna take that Ibuprofen shit, fuck that! That's bullshit." Pt asked by RN to refrain from profanity, pt did not change behaviors. Dr. Baron Sane notified of outcome of examination. No new orders at this time.

## 2013-04-01 NOTE — Progress Notes (Signed)
[facility] Group Notes:  (Nursing/MHT/Case Management/Adjunct)  Date:  04/01/2013  Time:  2000  Type of Therapy:  Psychoeducational Skills  Participation Level:  Active  Participation Quality:  Attentive  Affect:  Blunted  Cognitive:  Appropriate  Insight:  Improving  Engagement in Group:  Improving  Modes of Intervention:  Education  Summary of Progress/Problems: The patient expressed in group this evening that he had a better day today. First of all , he stated that his peers were supportive of him throughout the day. Secondly, he mentioned that his mother contacted him today and she has agreed to give him a place to live and help him to find a job provided that he take care of himself properly. His goal for tomorrow is to have "respect for everybody's personality".   Hazle Coca S 04/01/2013, 11:41 PM

## 2013-04-01 NOTE — ED Notes (Signed)
Pt yelling and cussing stating that I am not signing anything and I am pissed and is mad cause I broke my foot and no one will give him pain medication". It was explained again to pt he does not have a fx. Pt yelled "just get me the fuck out of here. I want to go".

## 2013-04-01 NOTE — Progress Notes (Signed)
Patient ID: Robert Kramer, male   DOB: 04/25/1976, 37 y.o.   MRN: [id number]  Pt to be transported to ER via security. Report called to Victorino Dike, RN ER Charge nurse. No distress noted at this time.

## 2013-04-01 NOTE — ED Notes (Signed)
Pt refused ibuprofen states i don't usually take this i think it might tear up my stomach. When explained that he doesn't have to take the medication if he doesn't want to he states well this isnt going to help me i need real pain medication I need something strong. He yelled that he wanted  To talk to his MD. I Made PA aware.

## 2013-04-01 NOTE — Progress Notes (Addendum)
Patient ID: Robert Kramer, male   DOB: 1975-11-05, 37 y.o.   MRN: [id number]  Patient approached staff with demands to "Go to the emergency room now!" When RN asked pt why, pt states "Cause I broke my fucking foot, that's the hell why!". Pt with noted redness to right anterior foot, no swelling noted. RN instructed the patient to elevate foot, pt provided an ice pack for comfort, and MD will be called. RN informed pt that foot would be monitored for swelling. Pt began to curse RN yelling "Look you fucking bitch, I'm calling 911 and getting my own damned ride!" RN again asked pt to sit with foot elevated, but pt insistent on ambulating in hallway with foot down. Pt difficult to redirect. Dr. Hilton Cork notified, new orders obtained to transfer pt to ER for evaluation.

## 2013-04-01 NOTE — Progress Notes (Signed)
Patient ID: Robert Kramer, male   DOB: 1976/06/29, 37 y.o.   MRN: [id number] Pt to ER via security, accompanied by male MHT for safety.

## 2013-04-01 NOTE — ED Notes (Signed)
MD at bedside. 

## 2013-04-01 NOTE — Progress Notes (Signed)
Adult Psychoeducational Group Note  Date:  04/01/2013 Time:  0900  Group Topic/Focus:  Healthy Coping Skills  Participation Level:  None  Participation Quality:  Drowsy  Affect:  Flat  Cognitive:  Appropriate  Insight: Lacking  Engagement in Group:  Distracting and None  Modes of Intervention:  Discussion and Education  Additional Comments:  Pt slept through most of there group  Robert Kramer 04/01/2013, 10:20 AM

## 2013-04-01 NOTE — ED Notes (Signed)
Bed: [id number] Expected date: 04/01/13 Expected time: 8:50 PM Means of arrival: Ambulance Comments: Ems, headache

## 2013-04-01 NOTE — ED Provider Notes (Signed)
  Medical screening examination/treatment/procedure(s) were performed by non-physician practitioner and as supervising physician I was immediately available for consultation/collaboration.    Gerhard Munch, MD 04/01/13 2321
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
## 2013-04-01 NOTE — ED Provider Notes (Signed)
CSN: [id number]     Arrival date & time 04/01/13  2050 History  This chart was scribed for non-physician practitioner Ivonne Andrew, working with Gerhard Munch, MD by Carl Best, ED Scribe. This patient was seen in room WTR6/WTR6 and the patient's care was started at 9:58 PM.     Chief Complaint  Patient presents with  . Foot Injury    right foot    Patient is a 37 y.o. male presenting with foot injury. The history is provided by the patient. No language interpreter was used.  Foot Injury  HPI Comments: Robert Kramer is a 37 y.o. male who presents to the Emergency Department complaining of constant pain in his right foot that started this evening after he hit his right foot on a chair while walking.  The patient states that the pain is aggravated with walking.  He states that he iced the are and elevated the right foot with no relief. The patient states that he has a history of weak ankles on his right foot.   He does have history of chronic injury to the foot the fractures previously. No other aggravating or alleviating factors. No associated weakness or numbness in the foot or toes. No other associated symptoms.    Past Medical History  Diagnosis Date  . Anxiety   . Hallux valgus with bunions   . Bipolar 1 disorder   . Mood swings   . Depression    Past Surgical History  Procedure Laterality Date  . Fatty tumor removed from left foot     History reviewed. No pertinent family history. History  Substance Use Topics  . Smoking status: Current Every Day Smoker -- 0.50 packs/day    Types: Cigarettes  . Smokeless tobacco: Not on file  . Alcohol Use: Yes     Comment: heavy     Review of Systems  Musculoskeletal: Positive for arthralgias (right foot).  Neurological: Negative for weakness and numbness.  All other systems reviewed and are negative.    Allergies  Tramadol  Home Medications  No current outpatient prescriptions on file.  Triage Vitals: BP 144/96  Pulse  83  Temp(Src) 98.4 F (36.9 C) (Oral)  Resp 16  Ht 5\' 9"  (1.753 m)  Wt 169 lb (76.658 kg)  BMI 24.95 kg/m2  SpO2 99%  Physical Exam  Constitutional: He is oriented to person, place, and time. He appears well-developed and well-nourished. No distress.  HENT:  Head: Normocephalic and atraumatic.  Right Ear: External ear normal.  Left Ear: External ear normal.  Nose: Nose normal.  Eyes: Conjunctivae are normal.  Cardiovascular: Normal rate, regular rhythm, normal heart sounds and intact distal pulses.   Normal distal pulses and capillary refills to the toes.  Pulmonary/Chest: Effort normal. No respiratory distress. He has no wheezes.  Musculoskeletal: Normal range of motion. He exhibits edema (arch of the right foot) and tenderness.  Localized swelling and mild erythema to the dorsal aspect of the mid foot. Normal dorsal pedal pulse. There is a chronic appearing bunion of the first MTP. Normal range of motion  Neurological: He is alert and oriented to person, place, and time.  Skin: Skin is warm and dry. He is not diaphoretic.  Psychiatric: He has a normal mood and affect.    ED Course  Procedures  DIAGNOSTIC STUDIES: Oxygen Saturation is 99% on room air, normal by my interpretation.    COORDINATION OF CARE: 10:01 PM- Discussed the x-ray results with the patient that showed no  broken bones in the right foot with the patient.  Discussed placing a brace on the patient's right foot and advised him to ice the affected area.   Discussed discharging the patient with medication for pain.  Advised the patient to return to the ED if the pain has not subsided.The patient agreed to the treatment plan.       Imaging Review Dg Foot Complete Right  04/01/2013   *RADIOLOGY REPORT*  Clinical Data: Hit right foot on wooden chair.  Pain at the first metatarsal-phalangeal joint.  RIGHT FOOT COMPLETE - 3+ VIEW  Comparison: None.  Findings: No fracture.  There is a hallux valgus deformity at the  first metatarsal-phalangeal joint.  There is no dislocation.  Mild spurring is noted at the tarsal metatarsal articulations on the lateral view.  The soft tissues are unremarkable.  IMPRESSION: No fracture or dislocation.   Original Report Authenticated By: Amie Portland, M.D.    MDM   1. Bipolar I disorder, most recent episode (or current) depressed, severe, without mention of psychotic behavior   2. Contusion of right foot, initial encounter    Patient seen and evaluated. Patient has old injury to the foot with chronic changes. X-rays reviewed with the patient. No signs of acute injury. There is mild swelling to the dorsal foot consistent with contusion. Patient advised to use rice therapy  I personally performed the services described in this documentation, which was scribed in my presence. The recorded information has been reviewed and is accurate.  Patient was upset with recommendations for ibuprofen. He refused ibuprofen from the nurse. I discussed that with his bruising and swelling there is no indications for any narcotics. I advised him to followup with a primary care provider or his doctors at [facility] for any additional treatments.      Angus Seller, PA-C 04/01/13 2229

## 2013-04-01 NOTE — ED Notes (Signed)
Patient is alert and oriented x3.  He is complaining of right foot pain after hitting his foot on a chair at [facility] day room.   He states that his pain is 10 of 10.

## 2013-04-01 NOTE — Progress Notes (Signed)
Patient ID: Robert Kramer, male   DOB: Dec 31, 1975, 37 y.o.   MRN: [id number]  D: Pt was anxious and agitated during the assessment. Pt was demanding to get ativan for detox threatening to throw chairs or cause some type of ruckus. Informed the writer that he has a "prison mentality".  Stated, "if we knew what was going on in his mind, we would put him in a straight jacket". Writer attempted to encourage pt to take prn librium and vistaril. Pt refused stating he's taken them in the past and none worked. Pt began pacing the hall, talking loudly. Eventually the [facility] and PA spoke to pt and was given prn orders. Pt did accept accept librium and vistaril at apprx 1700.  Came back and asked for ativan at apprx 2000 after being told by another RN that it was available for him. However, during a call regarding another pt, PA informed the writer that ativan was only to be given with extreme agitation. Writer informed the pt that she was instructed to give vistaril and librium.    A:  Support and encouragement was offered. 15 min checks continued for safety.  R: Pt remains safe.     ....Marland KitchenMarland KitchenMarland Kitchen At apprx 2145 pt was smoking in his room. After a search by Clinical research associate and 2 mht's pt was found to have an open pk of cigs and lighter in his possession. Pt later bragged about getting the contraband on the unit.

## 2013-04-01 NOTE — Progress Notes (Addendum)
Patient ID: Robert Kramer, male   DOB: [birth year], 37 y.o.   MRN: [id number] D: Pt is awake and active on the unit this AM. Pt denies SI/HI and A/V hallucinations. Pt's mood is depressed/anxious and his affect is flat/sad. Pt is attending groups but is drowsy and non-participating. Pt c/o foot pain related to the flooring and hx of foot surgeries. Writer provided pt his shoes without the laces in them. Pt is behavior is appropriate and he is cooperative with staff this AM.   A: Encouraged pt to discuss feelings with staff and administered medication per MD orders. Writer also encouraged pt to participate in groups.  R: Pt is attending groups and tolerating medications well. Writer will continue to monitor. 15 minute checks are ongoing for safety.

## 2013-04-01 NOTE — [facility] Group Notes (Signed)
[facility] Group Notes:  (Clinical Social Work)  04/01/2013   3:00-4:00PM  Summary of Progress/Problems:   The main focus of today's process group was for the patient to identify ways in which they have sabotaged their own mental health wellness/recovery.  Motivational interviewing was used to explore the reasons they engage in this behavior, and reasons they may have for wanting to change.  The Stages of Change were explained to the group using a handout.  The patient was in and out of the room numerous times.  He expressed that he self sabotages by stopping exercise, eating poorly, and using alcohol a lot.  He wants to change because he states that he has been like this "enough" and is going to end up dead if he does not stop.  He states he has lived this way his entire life and wants to know a different, better way.  Type of Therapy:  Process Group  Participation Level:  Active  Participation Quality:  Attentive, Intrusive, Inattentive, Redirectable and vacillating  Affect:  Blunted  Cognitive:  Oriented  Insight:  Improving  Engagement in Therapy:  Improving  Modes of Intervention:  Education, Motivational Interviewing   Ambrose Mantle, LCSW 04/01/2013, 4:30 PM

## 2013-04-01 NOTE — Progress Notes (Signed)
Patient ID: Robert Kramer, male   DOB: 22-May-1976, 37 y.o.   MRN: [id number] Pt states to RN, "That doctor here tomorrow will be sending me back for another damned x-ray. My foot was too swollen to see the fucking fracture. He will do it and give me pain medicine!".
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
## 2013-04-02 DIAGNOSIS — F1220 Cannabis dependence, uncomplicated: Secondary | ICD-10-CM

## 2013-04-02 DIAGNOSIS — F1020 Alcohol dependence, uncomplicated: Principal | ICD-10-CM

## 2013-04-02 DIAGNOSIS — F1410 Cocaine abuse, uncomplicated: Secondary | ICD-10-CM

## 2013-04-02 MED ORDER — BENZOCAINE 10 % MT GEL
Freq: Three times a day (TID) | OROMUCOSAL | Status: DC | PRN
  Administered 2013-04-02: 21:00:00 via OROMUCOSAL
  Filled 2013-04-02: qty 9.4

## 2013-04-02 NOTE — [facility] Group Notes (Signed)
[facility] Group Notes:  (Clinical Social Work)  04/02/2013   3:00-4:00PM  Summary of Progress/Problems:   The main focus of today's process group was to   identify the patient's current support system and decide on other supports that can be put in place.  The picture on workbook was used to discuss why additional supports are needed, and a hand-out was distributed with four definitions/levels of support, then used to talk about how patients have given and received all different kinds of support.  An emphasis was placed on using counselor, doctor, therapy groups, 12-step groups, and problem-specific support groups to expand supports.  The patient identified his mother as being supportive of him as long as he is in recovery.  He talked about how AA has not been helpful to him, but later admitted that he has not "worked the program" and did not use his sponsor as he should have prior to latest relapse.  Type of Therapy:  Process Group  Participation Level:  Active  Participation Quality:  Attentive and Sharing  Affect:  Depressed and Flat  Cognitive:  Oriented  Insight:  Improving  Engagement in Therapy:  Improving  Modes of Intervention:  Education,  Support and Processing  Ambrose Mantle, LCSW 04/02/2013, 4:39 PM

## 2013-04-02 NOTE — Progress Notes (Signed)
[facility] Group Notes:  (Nursing/MHT/Case Management/Adjunct)  Date:  04/02/2013  Time:  2000  Type of Therapy:  Psychoeducational Skills  Participation Level:  Active  Participation Quality:  Appropriate  Affect:  Appropriate  Cognitive:  Appropriate  Insight:  Good  Engagement in Group:  Improving  Modes of Intervention:  Education  Summary of Progress/Problems: The patient described his day as having been "pretty good" for a number of reasons. First of all, he mentioned that his medications have been straightened out. . Secondly, he mentioned that he was able to work on some "art". 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 His goal for tomorrow is to "be the best that I can be".   Hazle Coca S 04/02/2013, 11:52 PM

## 2013-04-02 NOTE — Progress Notes (Addendum)
Pt is pleasant and cooperative attending groups. C/O back toothache and is requested pain medication for this. MD made aware. Pt requested ativan for feeling nervous and was given 2mg  of ativan po at 5pm./ Tolerated well and stated it has made him feel better. No SI or HI and contracts for safety.Pt is constantly at the med window asking for any prn meds he can take. He c/o tooth pain and was given oragel for this. Pt was also given 50mg  of trazadone for sleep.He stated he would come back to the med window at 1am for more ativan. Pt does not appear overly anxious or in any distress. He is very vocal on the hall.

## 2013-04-02 NOTE — [facility] Group Notes (Signed)
[facility] Group Notes:  (Nursing/MHT/Case Management/Adjunct)  Date:  04/02/2013  Time:  11:11 AM Type of Therapy: Psychoeducational Skills  Participation Level: Active  Participation Quality: Appropriate  Affect: Depressed  Cognitive: Alert and Appropriate  Insight: Appropriate  Engagement in Group: Developing/Improving  Modes of Intervention: Activity, Discussion, Education and Exploration  Summary of Progress/Problems:  Pt attended group programming, discussed healthy support systems - patient identified areas he needs to change, '' not hang around the same crowd I was drinking around so I can get my life together for my daughter'' Pt was inattentive and somewhat distracted at times , but was supportive to peers while sharing.   Malva Limes 04/02/2013, 11:11 AM

## 2013-04-02 NOTE — Progress Notes (Signed)
Patient ID: Robert Kramer, male   DOB: Nov 30, 1975, 37 y.o.   MRN: [id number]  D: Patient angry, cursing and demanding narcotic medications during shift. Pt difficult to redirect at times and causing distractions for others on the unit. A: Q 15 minute safety checks, encourage group participation and staff/peer interaction. Administer medications as ordered by MD. Redirect behaviors as needed. R: Pt remains agitated and labile, med-seeking and demanding of staff. Pt denies SI.

## 2013-04-02 NOTE — Clinical Social Work Note (Signed)
Two shirts and one pair of long pants were provided to patient at his request so that the clothing he is currently wearing can be washed.  Ambrose Mantle, LCSW 04/02/2013, 4:43 PM

## 2013-04-02 NOTE — Progress Notes (Signed)
[name] MD Progress Note  04/02/2013 5:36 PM Robert Kramer  MRN:  [id number]  Subjective: The patient reports he sleep well last night. The patient reports that he has not had any seizure activity. The patient continues have thoughts of harming his ex-girlfriend's current boyfriend.  He reports he is participating in groups.  He was sent to ED yesterday due to foot pain and became verbally abusive to staff both at the ED and the unit.  Imaging was done in the ED but as the patient did not have a fracture he was given a brace.  Today he states that he has been having a toothache for the past week. He reports a past history of violence and was released from prison recently allegedly due to a felony related to violent behavior.   Diagnosis:   DSM5: Substance/Addictive Disorders:  Alcohol Related Disorder - Severe (303.90) and Cannabis Use Disorder - Severe (304.30) Stimulant Use Disorder-mild (cocaine)   AXIS I: Alcohol Related Disorder - Severe (303.90) and Cannabis Use Disorder - Severe (304.30) Stimulant Use Disorder-mild (cocaine); Rule out Malingering;  Rule out Bipolar I Disorder, most recent episode depressed AXIS II: Antisocial Personality Disorder  AXIS III:  Past Medical History   Diagnosis  Date   .  Anxiety    .  Hallux valgus with bunions    .  Bipolar 1 disorder    .  Mood swings    .  Depression     AXIS IV: economic problems, housing problems and problems related to legal system/crime  AXIS V: 21-30 behavior considerably influenced by delusions or hallucinations OR serious impairment in judgment, communication OR inability to function in almost all areas   ADL's:  Intact  Sleep: Good  Appetite:  Fair  Suicidal Ideation:  Plan:  Patient denies. Intent:  Patient denies Means:  Patient denies. Homicidal Ideation:  Patient reports thoughts of hurting his ex-girlfriend's boyfriend. AEB (as evidenced by):  Psychiatric Specialty Exam: ROS  Blood pressure 122/83, pulse 88,  temperature 98.4 F (36.9 C), temperature source Oral, resp. rate 16, height 5\' 9"  (1.753 m), weight 76.658 kg (169 lb), SpO2 99.00%.Body mass index is 24.95 kg/(m^2).  General Appearance: Casual and Fairly Groomed  Patent attorney::  Good  Speech:  Normal Rate  Volume:  Normal  Mood:  Euthymic  Affect:  Appropriate, Congruent and Full Range  Thought Process:  Coherent, Linear and Logical  Orientation:  Full (Time, Place, and Person)  Thought Content:  WDL  Suicidal Thoughts:  No  Homicidal Thoughts:  No-does endorse wanting to hurt hus ex-girlfriend's boyfriend.   Memory:  Immediate;   Good Recent;   Poor Remote;   Fair  Judgement:  Poor  Insight:  Shallow  Psychomotor Activity:  Normal  Concentration:  Fair  Recall:  Poor  Akathisia:  Negative  Handed:  Right  AIMS (if indicated):     Assets:  Others:  None  Sleep:  Number of Hours: 5.75   Current Medications: Current Facility-Administered Medications  Medication Dose Route Frequency Provider Last Rate Last Dose  . acetaminophen (TYLENOL) tablet 650 mg  650 mg Oral Q6H PRN Earney Navy, NP      . alum & mag hydroxide-simeth (MAALOX/MYLANTA) 200-200-20 MG/5ML suspension 30 mL  30 mL Oral Q4H PRN Earney Navy, NP      . benztropine (COGENTIN) tablet 1 mg  1 mg Oral BID PRN Verne Spurr, PA-C       Or  . benztropine mesylate (  COGENTIN) injection 1 mg  1 mg Intramuscular BID PRN Verne Spurr, PA-C      . chlordiazePOXIDE (LIBRIUM) capsule 25 mg  25 mg Oral Q6H PRN Nehemiah Settle, MD      . Melene Muller ON 04/03/2013] chlordiazePOXIDE (LIBRIUM) capsule 25 mg  25 mg Oral BH-qamhs Nehemiah Settle, MD       Followed by  . [START ON 04/04/2013] chlordiazePOXIDE (LIBRIUM) capsule 25 mg  25 mg Oral Daily Nehemiah Settle, MD      . diphenhydrAMINE (BENADRYL) capsule 50 mg  50 mg Oral Q8H PRN Verne Spurr, PA-C       Or  . diphenhydrAMINE (BENADRYL) injection 50 mg  50 mg Intramuscular Q8H PRN Verne Spurr, PA-C      . haloperidol (HALDOL) tablet 5 mg  5 mg Oral Q8H PRN Verne Spurr, PA-C       Or  . haloperidol lactate (HALDOL) injection 5 mg  5 mg Intramuscular Q8H PRN Verne Spurr, PA-C      . hydrOXYzine (ATARAX/VISTARIL) tablet 25 mg  25 mg Oral Q6H PRN Nehemiah Settle, MD   25 mg at 04/02/13 1209  . ibuprofen (ADVIL,MOTRIN) tablet 600 mg  600 mg Oral Q8H PRN Earney Navy, NP   600 mg at 04/02/13 0821  . lithium carbonate capsule 150 mg  150 mg Oral BID WC Larena Sox, MD   150 mg at 04/02/13 1715  . loperamide (IMODIUM) capsule 2-4 mg  2-4 mg Oral PRN Nehemiah Settle, MD      . LORazepam (ATIVAN) tablet 2 mg  2 mg Oral TID PRN Verne Spurr, PA-C   2 mg at 04/02/13 1715   Or  . LORazepam (ATIVAN) injection 2 mg  2 mg Intramuscular TID PRN Verne Spurr, PA-C      . magnesium hydroxide (MILK OF MAGNESIA) suspension 30 mL  30 mL Oral Daily PRN Earney Navy, NP      . multivitamin with minerals tablet 1 tablet  1 tablet Oral Daily Nehemiah Settle, MD   1 tablet at 04/02/13 0818  . nicotine (NICODERM CQ - dosed in mg/24 hours) patch 21 mg  21 mg Transdermal Daily Nehemiah Settle, MD   21 mg at 04/02/13 0818  . ondansetron (ZOFRAN-ODT) disintegrating tablet 4 mg  4 mg Oral Q6H PRN Nehemiah Settle, MD      . thiamine (VITAMIN B-1) tablet 100 mg  100 mg Oral Daily Nehemiah Settle, MD   100 mg at 04/02/13 0818  . traZODone (DESYREL) tablet 50 mg  50 mg Oral QHS PRN,MR X 1 Earney Navy, NP   50 mg at 04/01/13 2251    Lab Results: No results found for this or any previous visit (from the past 48 hour(s)).  Physical Findings: AIMS: Facial and Oral Movements Muscles of Facial Expression: None, normal Lips and Perioral Area: None, normal Jaw: None, normal Tongue: None, normal,Extremity Movements Upper (arms, wrists, hands, fingers): None, normal Lower (legs, knees, ankles, toes): None, normal, Trunk  Movements Neck, shoulders, hips: None, normal, Overall Severity Severity of abnormal movements (highest score from questions above): None, normal Incapacitation due to abnormal movements: None, normal Patient's awareness of abnormal movements (rate only patient's report): No Awareness, Dental Status Current problems with teeth and/or dentures?: No Does patient usually wear dentures?: No  CIWA:  CIWA-Ar Total: 3 COWS:     Treatment Plan Summary: Daily contact with patient to assess and evaluate symptoms  and progress in treatment Medication management  Plan:  Observation Level/Precautions: Detox  15 minute checks   Laboratory: None   Psychotherapy: Patient will attend group and AA   Medications: Continue detox protocol. Will increase Lithium 300 mg BID. His Ativan was increase today by the NP however I have discussed with the patient about the fact that he would not be discharged on Ativan.  Consultations: None   Discharge Concerns: Compliance. Homicidal ideation towards his ex-girlfriend's boyfriend   Estimated LOS: 5-7   Other: -Will decrease/taper available dosing of PRN Ativan throughout his inpatient stay and taper this medication.  -Lithium level, TSH, BUN creatinine,  prior to discharge.     Medical Decision Making Problem Points:  Established problem, stable/improving (1), Review of last therapy session (1) and Review of psycho-social stressors (1) Data Points:  Review or order medicine tests (1) Review of medication regiment & side effects (2) Review of new medications or change in dosage (2)  I certify that inpatient services furnished can reasonably be expected to improve the patient's condition.   Cloyd Ragas 04/02/2013, 5:36 PM

## 2013-04-02 NOTE — Progress Notes (Signed)
Patient ID: Robert Kramer, male   DOB: 1976/04/03, 37 y.o.   MRN: [id number] D: Pt is awake and active on the unit this AM. Pt denies SI/HI and A/V hallucinations. Pt mood is irritable and his affect is anxious/depressed. Pt is c/o foot pain related to kicking a chair yesterday. Pt states that it is broken, but he is walking on it well this AM. Pt is irritable but cooperative with staff. Pt attended group this AM. Pt shared and was appropriate.  A: Encouraged pt to discuss feelings with staff and administered medication per MD orders. Writer also encouraged pt to participate in groups.  R: Pt is attending groups and tolerating medications well. Writer will continue to monitor. 15 minute checks are ongoing for safety.

## 2013-04-03 MED ORDER — LITHIUM CARBONATE 150 MG PO CAPS: 150.0000 mg | ORAL_CAPSULE | Freq: Two times a day (BID) | ORAL | Status: DC

## 2013-04-03 MED ORDER — LORAZEPAM 0.5 MG PO TABS: ORAL_TABLET | ORAL | Status: DC

## 2013-04-03 NOTE — Progress Notes (Signed)
[facility] Adult Case Management Discharge Plan :  Will you be returning to the same living situation after discharge: Yes,  pt states that he is going to his mom's house in [place] before coming back to [place].  At discharge, do you have transportation home?:Yes,  provided pt with a bus pass and taxi voucher Do you have the ability to pay for your medications:Yes,  access to meds  Release of information consent forms completed and in the chart;  Patient's signature needed at discharge.  Patient to Follow up at: Follow-up Information   Follow up with [facility].   Contact information:   [address] [place] [address] [phone]      Follow up with [facility] On 04/04/2013. (Walk in on this date for hospital discharge appointment, for medication management and therapy.  Walk in clinic is Monday - Friday 8 am - 3 pm)    Contact information:   [address], [place] [address] Phone: [phone] Fax: [phone]      Patient denies SI/HI:   Yes,  denies SI/HI    Safety Planning and Suicide Prevention discussed:  Yes,  discussed with pt and pt's mother.  See suicide prevention education note.   CSW contacted [employer] to do duty to warn due to pt not knowing the man he wants to hurt's location or phone number.  Pt states that he wants to hurt Everlean Alstrom, his ex girlfriend's new boyfriend.  CSW was informed a Emergency planning/management officer would be sent over to talk to CSW about this.  CSw spoke with Officer P. Domitrovitz (badge # [id number]) and explained the situation in regards to pt's HI.  The officer took pt's contact information and Barbara Cower Tuggle's name down.  The officer states that she will attempt to warn Everlean Alstrom and put an alert out as well.    Carmina Miller 04/03/2013, 11:41 AM

## 2013-04-03 NOTE — Tx Team (Signed)
Interdisciplinary Treatment Plan Update (Adult)  Date: 04/03/2013  Time Reviewed:  9:45 AM  Progress in Treatment: Attending groups: Yes Participating in groups:  Yes Taking medication as prescribed:  Yes Tolerating medication:  Yes Family/Significant othe contact made: Yes  Patient understands diagnosis:  Yes Discussing patient identified problems/goals with staff:  Yes Medical problems stabilized or resolved:  Yes Denies suicidal/homicidal ideation: Yes Issues/concerns per patient self-inventory:  Yes Other:  New problem(s) identified: N/A  Discharge Plan or Barriers: Pt will follow up at [facility] for medication management and therapy.    Reason for Continuation of Hospitalization: Stable to d/c  Comments: N/A  Estimated length of stay: D/C today  For review of initial/current patient goals, please see plan of care.  Attendees: Patient: Robert Kramer  04/03/2013 11:38 AM   Family:     Physician:  Dr. Javier Glazier 04/03/2013 11:33 AM   Nursing:   Neill Loft, RN 04/03/2013 11:33 AM   Clinical Social Worker:  Reyes Ivan, LCSWA 04/03/2013 11:33 AM   Other: Verne Spurr, PA 04/03/2013 11:33 AM   Other:  Frankey Shown, MA care coordination 04/03/2013 11:33 AM   Other:  Juline Patch, LCSW 04/03/2013 11:33 AM   Other:  Roswell Miners, RN 04/03/2013 11:34 AM   Other: Quintella Reichert, RN 04/03/2013 11:33 AM   Other: Onnie Boer, RN care manager 04/03/2013 11:33 AM   Other:    Other:    Other:    Other:     Scribe for Treatment Team:   Carmina Miller, 04/03/2013 11:33 AM

## 2013-04-03 NOTE — [facility] Suicide Risk Assessment (Signed)
Suicide Risk Assessment  Discharge Assessment     Demographic Factors:  Male, Adolescent or young adult, Caucasian, Low socioeconomic status and Living alone  Mental Status Per Nursing Assessment::   On Admission:     Current Mental Status by Physician: Mental Status Examination: Patient appeared as per his stated age, casually dressed, and fairly groomed, and maintaining good eye contact. Patient has good mood and his affect was constricted. He has normal rate, rhythm, and volume of speech. His thought process is linear and goal directed. Patient has denied suicidal, homicidal ideations, intentions or plans. Patient has no evidence of auditory or visual hallucinations, delusions, and paranoia. Patient has fair to poor insight judgment and impulse control.  Loss Factors: Decrease in vocational status, Legal issues and Financial problems/change in socioeconomic status  Historical Factors: Impulsivity  Risk Reduction Factors:   Sense of responsibility to family, Religious beliefs about death, Positive social support and Positive therapeutic relationship  Continued Clinical Symptoms:  Alcohol/Substance Abuse/Dependencies  Cognitive Features That Contribute To Risk:  Polarized thinking    Suicide Risk:  Minimal: No identifiable suicidal ideation.  Patients presenting with no risk factors but with morbid ruminations; may be classified as minimal risk based on the severity of the depressive symptoms  Discharge Diagnoses:   AXIS I:  Substance Induced Mood Disorder and Alcohol dependence and alcohol withdrawal, cocaine intoxication AXIS II:  Antisocial Personality Disorder AXIS III:   Past Medical History  Diagnosis Date  . Anxiety   . Hallux valgus with bunions   . Bipolar 1 disorder   . Mood swings   . Depression    AXIS IV:  economic problems, occupational problems, other psychosocial or environmental problems and problems related to social environment AXIS V:  51-60 moderate  symptoms  Plan Of Care/Follow-up recommendations:  Activity:  As tolerated Diet:  Regular  Is patient on multiple antipsychotic therapies at discharge:  No   Has Patient had three or more failed trials of antipsychotic monotherapy by history:  No  Recommended Plan for Multiple Antipsychotic Therapies: NA  Amitai Delaughter,JANARDHAHA R. 04/03/2013, 1:15 PM

## 2013-04-03 NOTE — Progress Notes (Addendum)
D:  Patient denied SI and HI.  Denied A/V hallucinations.  Denied pain.  Fair sleep, improving appetite, low energy level, poor attention span.  Rated depression #9, hopeless #8.  Has experienced chilling, cravings, agitation.  Pain goal 8, worst pain 8.  Plans NA & AA.  No discharge plans.  No problems taking meds after discharge.   A:  Medication given for anxiety.  Emotional support and encouragement given patient. R:  Will continue to monitor for safety with 15 minute checks.  Safety maintained.

## 2013-04-03 NOTE — ED Provider Notes (Signed)
Medical screening examination/treatment/procedure(s) were performed by non-physician practitioner and as supervising physician I was immediately available for consultation/collaboration.    Demetruis Depaul R Azhar Knope, MD 04/03/13 1018 

## 2013-04-03 NOTE — Progress Notes (Signed)
Patient ID: Robert Kramer, male   DOB: Jul 04, 1976, 37 y.o.   MRN: [id number] D: The patient is resting in bed with eyes closed. A: No distress noted.  Safety maintained R. Pt. is safe. Will continue to monitor.

## 2013-04-03 NOTE — ED Provider Notes (Signed)
Medical screening examination/treatment/procedure(s) were performed by non-physician practitioner and as supervising physician I was immediately available for consultation/collaboration.    Babita Amaker R Mariana Wiederholt, MD 04/03/13 1019 

## 2013-04-03 NOTE — Discharge Summary (Signed)
Physician Discharge Summary Note  Patient:  Robert Kramer is an 37 y.o., male MRN:  [id number] DOB:  07-22-1975 Patient phone:  [phone] (home)  Patient address:   [address] [place] [address],   Date of Admission:  03/31/2013 Date of Discharge: 04/03/2013  Reason for Admission:  Alcohol detox  Discharge Diagnoses: Active Problems:   * No active hospital problems. *  ROS  DSM5: Assessment:  DSM5:  Substance/Addictive Disorders: Alcohol Related Disorder - Severe (303.90) and Cannabis Use Disorder - Mild (305.20), Cocaine  Depressive Disorders: Bipolar I Disorder, most recent episode depressed.  AXIS I: Bipolar I Disorder, most recent episode depressed.  AXIS II: Antisocial Personality Disorder  AXIS III:  Past Medical History   Diagnosis  Date   .  Anxiety    .  Hallux valgus with bunions    .  Bipolar 1 disorder    .  Mood swings    .  Depression     AXIS IV: economic problems, housing problems and problems related to legal system/crime  AXIS V: 21-30 behavior considerably influenced by delusions or hallucinations OR serious impairment in judgment, communication OR inability to function in almost all areas   Level of Care:  OP  Hospital Course:          Robert Kramer was admitted voluntarily from the ED where he presented with a chief complaint of needing detox from alcohol claiming he was drinking 1/2 gallon of liquor a day for several months, and polysubstance abuse with anger issues. He stated that he had HI toward his ex-girlfriend and the man she was currently with. He has never had a history of withdrawal seizures and reports a history of varied drugs of abuse. His drug of choice is alcohol with a BAL of 100, and his UDS was + for cocaine and marijuana.  He was admitted for detox and stabilization.         Robert Kramer is a career felon with a 15 year prison history who arrived at the unit cursing and demanding Ativan. He was agitated and irritated and demanding drugs  threatening to "go off" if he did not get what he wanted immediately.         He was difficult to redirect but educated regarding our detox protocol. He was ordered Haldol, Ativan, and Benedryl for escalation, but was able to manipulate the staff to get the Ativan. With in 24 hours of his arrival he was found smoking, and in possession of cigarettes and a lighter. When confronted he responded that "your system is not fool proof, I have prison mentality," and laughed.          Robert Kramer was flirtatious with the male patients engendering support for his problems. He continued to seek narcotic medication and at one point kicked a chair. He then demanded to go to the ED. He was evaluated in the ED, his Xray was negative for fracture, and he was recommended Ibuprofen. He cursed the ED staff and was agitated that he did not get narcotics.  Upon returning to the unit he continued to agitate and curse needing redirection and closer supervision.           He was given Ultram for his foot and started on Lithium by the provider the next day. His CIWA score was below 5 consistently and his primary symptom was agitation. Upon completion of his detox, he was discharged out.  He gave [place] the name of his intended victims and the CM will complete the duty to inform them of his discharge. Robert Kramer did not state a plan for harm and was felt to be malingering to stay in the hospital for narcotics. He demanded Ativan on discharge threatening to "go off" on staff if he did not get it. The police were called to assist with his discharge. He was given a prescription for 5 Ativan tablets with no refills in order to keep him from escalating any further. He did leave the hospital under the watchful eye of [employer] but not without cursing this provider and [facility] staff.           Consults:  None  Significant Diagnostic Studies:  radiology: X-Ray: foot  Discharge Vitals:   Blood pressure 109/76, pulse 88, temperature 97.4 F (36.3 C),  temperature source Oral, resp. rate 16, height 5\' 9"  (1.753 m), weight 76.658 kg (169 lb), SpO2 99.00%. Body mass index is 24.95 kg/(m^2). Lab Results:   No results found for this or any previous visit (from the past 72 hour(s)).  Physical Findings: AIMS: Facial and Oral Movements Muscles of Facial Expression: None, normal Lips and Perioral Area: None, normal Jaw: None, normal Tongue: None, normal,Extremity Movements Upper (arms, wrists, hands, fingers): None, normal Lower (legs, knees, ankles, toes): None, normal, Trunk Movements Neck, shoulders, hips: None, normal, Overall Severity Severity of abnormal movements (highest score from questions above): None, normal Incapacitation due to abnormal movements: None, normal Patient's awareness of abnormal movements (rate only patient's report): No Awareness, Dental Status Current problems with teeth and/or dentures?: No Does patient usually wear dentures?: No  CIWA:  CIWA-Ar Total: 3 COWS:  COWS Total Score: 1  Psychiatric Specialty Exam: See Psychiatric Specialty Exam and Suicide Risk Assessment completed by Attending Physician prior to discharge.  Discharge destination:  Home  Is patient on multiple antipsychotic therapies at discharge:  No   Has Patient had three or more failed trials of antipsychotic monotherapy by history:  No  Recommended Plan for Multiple Antipsychotic Therapies: NA  Discharge Orders   Future Orders Complete By Expires   Diet - low sodium heart healthy  As directed    Discharge instructions  As directed    Comments:     Take all of your medications as directed. Be sure to keep all of your follow up appointments.  If you are unable to keep your follow up appointment, call your Doctor's office to let them know, and reschedule.  Make sure that you have enough medication to last until your appointment. Be sure to get plenty of rest. Going to bed at the same time each night will help. Try to avoid sleeping during  the day.  Increase your activity as tolerated. Regular exercise will help you to sleep better and improve your mental health. Eating a heart healthy diet is recommended. Try to avoid salty or fried foods. Be sure to avoid all alcohol and illegal drugs.   Increase activity slowly  As directed        Medication List       Indication   ibuprofen 800 MG tablet  Commonly known as:  ADVIL,MOTRIN  Take 1 tablet (800 mg total) by mouth 3 (three) times daily.     For foot pain   lithium carbonate 150 MG capsule  Take 1 capsule (150 mg total) by mouth 2 (two) times daily with a meal. For mood stabilization.   Indication:  Manic-Depression  Follow-up Information   Follow up with [facility].   Contact information:   [address] [place] [address] [phone]      Follow up with [facility] On 04/04/2013. (Walk in on this date for hospital discharge appointment, for medication management and therapy.  Walk in clinic is Monday - Friday 8 am - 3 pm)    Contact information:   [address], [place] [address] Phone: [phone] Fax: [phone]      Follow-up recommendations:   Activities: Resume activity as tolerated. Diet: Heart healthy low sodium diet Tests: Follow up testing will be determined by your out patient provider.  Comments:  If [facility] should need admission in the future a higher level of care is recommended. He is not to be readmitted to this [facility].  Total Discharge Time:  Less than 30 minutes.  Signed: MASHBURN,NEIL 04/03/2013, 11:51 AM  Patient was seen personally, completed suicide risk assessment and made treatment plan and case discussed with the treatment team. Reviewed the information documented and agree with the treatment plan.  Makynlie Rossini,JANARDHAHA R. 04/08/2013 5:14 PM

## 2013-04-03 NOTE — Progress Notes (Signed)
Discharge Note:  Patient was discharged.  Taxi came for patient.  Patient became very upset over not having ativan given to him as take home medication.  Patient talked very loudly, paced hallway angrily.  Staff alerted, police called, taxi waiting for him.  Patient escorted to lobby with Irene Pap, security and MHTs.  Patient waited in lobby for ativan medication and will leave [facility]  Patient has verbal altercation with another patient this morning and was counseled by MD, staff.  Patient easily agitated, excited over small incidents.  Patient labile and volatile.  Patient refused to sign discharge information and then consented to sign all forms.

## 2013-04-03 NOTE — [facility] Suicide Risk Assessment (Signed)
[facility] INPATIENT:  Family/Significant Other Suicide Prevention Education  Suicide Prevention Education:  Education Completed; Robert Kramer - mother [phone]),  (name of family member/significant other) has been identified by the patient as the family member/significant other with whom the patient will be residing, and identified as the person(s) who will aid the patient in the event of a mental health crisis (suicidal ideations/suicide attempt).  With written consent from the patient, the family member/significant other has been provided the following suicide prevention education, prior to the and/or following the discharge of the patient.  The suicide prevention education provided includes the following:  Suicide risk factors  Suicide prevention and interventions  National Suicide Hotline telephone number  [facility] assessment telephone number  [facility] Emergency Assistance 911  [facility] and/or Residential Mobile Crisis Unit telephone number  Request made of family/significant other to:  Remove weapons (e.g., guns, rifles, knives), all items previously/currently identified as safety concern.    Remove drugs/medications (over-the-counter, prescriptions, illicit drugs), all items previously/currently identified as a safety concern.  The family member/significant other verbalizes understanding of the suicide prevention education information provided.  The family member/significant other agrees to remove the items of safety concern listed above.  Robert Kramer 04/03/2013, 11:24 AM
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
## 2013-04-04 ENCOUNTER — Encounter (HOSPITAL_COMMUNITY): Payer: Self-pay | Admitting: *Deleted

## 2013-04-04 ENCOUNTER — Emergency Department (HOSPITAL_COMMUNITY)
Admission: EM | Admit: 2013-04-04 | Discharge: 2013-04-04 | Disposition: A | Discharge status: Home / Self Care | Payer: No Typology Code available for payment source | Attending: Emergency Medicine | Admitting: Emergency Medicine

## 2013-04-04 DIAGNOSIS — F17200 Nicotine dependence, unspecified, uncomplicated: Secondary | ICD-10-CM | POA: Insufficient documentation

## 2013-04-04 DIAGNOSIS — Z8739 Personal history of other diseases of the musculoskeletal system and connective tissue: Secondary | ICD-10-CM | POA: Insufficient documentation

## 2013-04-04 DIAGNOSIS — K047 Periapical abscess without sinus: Secondary | ICD-10-CM | POA: Insufficient documentation

## 2013-04-04 DIAGNOSIS — K089 Disorder of teeth and supporting structures, unspecified: Secondary | ICD-10-CM | POA: Insufficient documentation

## 2013-04-04 DIAGNOSIS — Z79899 Other long term (current) drug therapy: Secondary | ICD-10-CM | POA: Insufficient documentation

## 2013-04-04 DIAGNOSIS — F3130 Bipolar disorder, current episode depressed, mild or moderate severity, unspecified: Secondary | ICD-10-CM | POA: Insufficient documentation

## 2013-04-04 DIAGNOSIS — F411 Generalized anxiety disorder: Secondary | ICD-10-CM | POA: Insufficient documentation

## 2013-04-04 DIAGNOSIS — K0889 Other specified disorders of teeth and supporting structures: Secondary | ICD-10-CM

## 2013-04-04 MED ORDER — AMOXICILLIN 250 MG PO CAPS
500.0000 mg | ORAL_CAPSULE | Freq: Once | ORAL | Status: AC
  Administered 2013-04-04: 500 mg via ORAL
  Filled 2013-04-04: qty 2

## 2013-04-04 MED ORDER — OXYCODONE-ACETAMINOPHEN 5-325 MG PO TABS
2.0000 | ORAL_TABLET | Freq: Once | ORAL | Status: AC
  Administered 2013-04-04: 2 via ORAL
  Filled 2013-04-04: qty 2

## 2013-04-04 MED ORDER — AMOXICILLIN 500 MG PO CAPS: 500.0000 mg | ORAL_CAPSULE | Freq: Three times a day (TID) | ORAL | Status: DC

## 2013-04-04 NOTE — ED Notes (Addendum)
Pt has felt pain in right upper mouth for 3 days. He began feeling pain after eating an apple, which has gotten worse since. He has used tylenol, oragel and gargled salt water, which did not relieve the pain. Gumline is receding on tooth.

## 2013-04-04 NOTE — ED Notes (Signed)
Dental pain   Upper rt  Jaw pain without swelling

## 2013-04-04 NOTE — Progress Notes (Signed)
ED/CM noted patient did not have health insurance and/or PCP listed in the computer.  Patient was given the [place] resource handout with information on the clinics, food pantries, and the handout for new health insurance sign-up.  Patient expressed appreciation for this. 

## 2013-04-04 NOTE — ED Provider Notes (Signed)
CSN: [id number]     Arrival date & time 04/04/13  1204 History   First MD Initiated Contact with Patient 04/04/13 1358     Chief Complaint  Patient presents with  . Dental Pain   (Consider location/radiation/quality/duration/timing/severity/associated sxs/prior Treatment) Patient is a 37 y.o. male presenting with tooth pain. The history is provided by the patient.  Dental Pain Location:  Upper Upper teeth location:  6/RU cuspid Quality:  Throbbing Duration:  3 days Timing:  Constant Progression:  Worsening Chronicity:  Chronic Context: dental caries   Relieved by:  Nothing Ineffective treatments:  None tried Associated symptoms: no fever, no headaches and no neck pain    MARTAVIS GURNEY is a 37 y.o. male who presents to the ED with dental pain. The pain started 3 days ago in the right upper dental area. The pain is moderate to severe.   Past Medical History  Diagnosis Date  . Anxiety   . Hallux valgus with bunions   . Bipolar 1 disorder   . Mood swings   . Depression    Past Surgical History  Procedure Laterality Date  . Fatty tumor removed from left foot     History reviewed. No pertinent family history. History  Substance Use Topics  . Smoking status: Current Every Day Smoker -- 0.50 packs/day    Types: Cigarettes  . Smokeless tobacco: Not on file  . Alcohol Use: Yes     Comment: heavy     Review of Systems  Constitutional: Negative for fever and chills.  HENT: Positive for dental problem. Negative for trouble swallowing and neck pain.   Respiratory: Negative for cough.   Cardiovascular: Negative for chest pain.  Gastrointestinal: Negative for nausea and vomiting.  Musculoskeletal: Negative for myalgias.  Skin: Negative for rash.  Allergic/Immunologic: Negative for immunocompromised state.  Neurological: Negative for headaches.  Psychiatric/Behavioral: The patient is not nervous/anxious (hx of anxiety).     Allergies  Tramadol  Home Medications    Current Outpatient Rx  Name  Route  Sig  Dispense  Refill  . lithium carbonate 150 MG capsule   Oral   Take 1 capsule (150 mg total) by mouth 2 (two) times daily with a meal. For mood stabilization.   60 capsule   0   . LORazepam (ATIVAN) 0.5 MG tablet      Take one tablet by mouth twice a day for 2 days. Then one in the morning to taper.   5 tablet   0    BP 135/95  Pulse 95  Temp(Src) 98.1 F (36.7 C) (Oral)  Resp 20  Ht 5\' 11"  (1.803 m)  Wt 173 lb (78.472 kg)  BMI 24.14 kg/m2  SpO2 100% Physical Exam  Nursing note and vitals reviewed. Constitutional: He is oriented to person, place, and time. He appears well-developed and well-nourished. No distress.  HENT:  Head: Normocephalic and atraumatic.  Right Ear: Tympanic membrane normal.  Left Ear: Tympanic membrane normal.  Mouth/Throat: Uvula is midline, oropharynx is clear and moist and mucous membranes are normal. Dental abscesses present.    Eyes: EOM are normal.  Neck: Neck supple.  Pulmonary/Chest: Effort normal.  Abdominal: Soft. There is no tenderness.  Musculoskeletal: Normal range of motion.  Neurological: He is alert and oriented to person, place, and time. No cranial nerve deficit.  Skin: Skin is warm and dry.    ED Course  Procedures  MDM  37 y.o. male with dental pain due to caries. Will treat with  antibiotics and he is to take ibuprofen for discomfort. No abscess noted at this time. Discussed with the patient clinical findings and plan of care. All questioned fully answered. He will follow up with the dental clinic as soon as possible.   Medication List    TAKE these medications       amoxicillin 500 MG capsule  Commonly known as:  AMOXIL  Take 1 capsule (500 mg total) by mouth 3 (three) times daily.      ASK your doctor about these medications       lithium carbonate 150 MG capsule  Take 1 capsule (150 mg total) by mouth 2 (two) times daily with a meal. For mood stabilization.      LORazepam 0.5 MG tablet  Commonly known as:  ATIVAN  Take one tablet by mouth twice a day for 2 days. Then one in the morning to taper.           [address] [place], [place] 04/09/13 [phone]

## 2013-04-04 NOTE — ED Notes (Signed)
Pt upset he was not receiving percocet pain medication. Pt was told percocet was not necessary for his symptoms. Pt left ED without discharge signature.

## 2013-04-05 NOTE — Progress Notes (Addendum)
Patient Discharge Instructions:  After Visit Summary (AVS):   Faxed to:  04/06/03 Psychiatric Admission Assessment Note:   Faxed to:  04/05/13 Suicide Risk Assessment - Discharge Assessment:   Faxed to:  04/05/13 Faxed/Sent to the Next Level Care provider:  04/05/13 Next Level Care Provider Has Access to the EMR, 04/05/13 Faxed to [facility] @ [phone] Records provided to [facility] via [facility]/Epic access.  Jerelene Redden, 04/05/2013, 4:06 PM

## 2013-04-12 ENCOUNTER — Encounter (HOSPITAL_COMMUNITY): Payer: Self-pay | Admitting: Emergency Medicine

## 2013-04-12 ENCOUNTER — Emergency Department (HOSPITAL_COMMUNITY)
Admission: EM | Admit: 2013-04-12 | Discharge: 2013-04-17 | Disposition: A | Discharge status: Home / Self Care | Payer: Self-pay | Attending: Emergency Medicine | Admitting: Emergency Medicine

## 2013-04-12 DIAGNOSIS — F319 Bipolar disorder, unspecified: Secondary | ICD-10-CM | POA: Insufficient documentation

## 2013-04-12 DIAGNOSIS — Z765 Malingerer [conscious simulation]: Secondary | ICD-10-CM | POA: Diagnosis present

## 2013-04-12 DIAGNOSIS — F332 Major depressive disorder, recurrent severe without psychotic features: Secondary | ICD-10-CM

## 2013-04-12 DIAGNOSIS — R45851 Suicidal ideations: Secondary | ICD-10-CM | POA: Insufficient documentation

## 2013-04-12 DIAGNOSIS — F1220 Cannabis dependence, uncomplicated: Secondary | ICD-10-CM | POA: Insufficient documentation

## 2013-04-12 DIAGNOSIS — F1920 Other psychoactive substance dependence, uncomplicated: Secondary | ICD-10-CM

## 2013-04-12 DIAGNOSIS — F411 Generalized anxiety disorder: Secondary | ICD-10-CM | POA: Insufficient documentation

## 2013-04-12 DIAGNOSIS — F1320 Sedative, hypnotic or anxiolytic dependence, uncomplicated: Secondary | ICD-10-CM | POA: Insufficient documentation

## 2013-04-12 DIAGNOSIS — F1010 Alcohol abuse, uncomplicated: Secondary | ICD-10-CM

## 2013-04-12 DIAGNOSIS — F1120 Opioid dependence, uncomplicated: Secondary | ICD-10-CM | POA: Diagnosis present

## 2013-04-12 DIAGNOSIS — F17200 Nicotine dependence, unspecified, uncomplicated: Secondary | ICD-10-CM | POA: Insufficient documentation

## 2013-04-12 DIAGNOSIS — Z79899 Other long term (current) drug therapy: Secondary | ICD-10-CM | POA: Insufficient documentation

## 2013-04-12 DIAGNOSIS — Z8739 Personal history of other diseases of the musculoskeletal system and connective tissue: Secondary | ICD-10-CM | POA: Insufficient documentation

## 2013-04-12 DIAGNOSIS — F1110 Opioid abuse, uncomplicated: Secondary | ICD-10-CM

## 2013-04-12 LAB — CBC
HCT: 46.4 % (ref 39.0–52.0)
Hemoglobin: 16.2 g/dL (ref 13.0–17.0)
MCH: 32 pg (ref 26.0–34.0)
Platelets: 199 10*3/uL (ref 150–400)
RBC: 5.07 MIL/uL (ref 4.22–5.81)
WBC: 10.8 10*3/uL — ABNORMAL HIGH (ref 4.0–10.5)

## 2013-04-12 LAB — COMPREHENSIVE METABOLIC PANEL
ALT: 50 U/L (ref 0–53)
AST: 45 U/L — ABNORMAL HIGH (ref 0–37)
Alkaline Phosphatase: 80 U/L (ref 39–117)
CO2: 23 mEq/L (ref 19–32)
Chloride: 103 mEq/L (ref 96–112)
GFR calc Af Amer: >90 mL/min (ref >90)
GFR calc non Af Amer: >90 mL/min (ref >90)
Glucose, Bld: 89 mg/dL (ref 70–99)
Potassium: 3.4 mEq/L — ABNORMAL LOW (ref 3.5–5.1)
Sodium: 138 mEq/L (ref 135–145)
Total Bilirubin: 0.4 mg/dL (ref 0.3–1.2)

## 2013-04-12 LAB — ACETAMINOPHEN LEVEL: Acetaminophen (Tylenol), Serum: <15 ug/mL (ref 10–30)

## 2013-04-12 LAB — RAPID URINE DRUG SCREEN, HOSP PERFORMED
Amphetamines: NOT DETECTED
Barbiturates: NOT DETECTED
Benzodiazepines: POSITIVE — AB
Opiates: NOT DETECTED
Tetrahydrocannabinol: POSITIVE — AB

## 2013-04-12 MED ORDER — IBUPROFEN 200 MG PO TABS
600.0000 mg | ORAL_TABLET | Freq: Three times a day (TID) | ORAL | Status: DC | PRN
  Administered 2013-04-14: 600 mg via ORAL
  Filled 2013-04-12: qty 3

## 2013-04-12 MED ORDER — NICOTINE 21 MG/24HR TD PT24
21.0000 mg | MEDICATED_PATCH | Freq: Every day | TRANSDERMAL | Status: DC
  Administered 2013-04-13 – 2013-04-16 (×4): 21 mg via TRANSDERMAL
  Filled 2013-04-12 (×4): qty 1

## 2013-04-12 MED ORDER — ZOLPIDEM TARTRATE 5 MG PO TABS
5.0000 mg | ORAL_TABLET | Freq: Every evening | ORAL | Status: DC | PRN
  Administered 2013-04-14: 5 mg via ORAL
  Filled 2013-04-12 (×2): qty 1

## 2013-04-12 MED ORDER — LORAZEPAM 1 MG PO TABS
1.0000 mg | ORAL_TABLET | Freq: Three times a day (TID) | ORAL | Status: DC | PRN
  Administered 2013-04-13 – 2013-04-15 (×5): 1 mg via ORAL
  Filled 2013-04-12 (×5): qty 1
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
## 2013-04-12 NOTE — ED Notes (Signed)
Bed: XB14 Expected date:  Expected time:  Means of arrival:  Comments: Hold for [place]

## 2013-04-12 NOTE — ED Notes (Signed)
Pt brought in by [employer] from [place] with c/o SI.  Denies plan.

## 2013-04-13 DIAGNOSIS — R45851 Suicidal ideations: Secondary | ICD-10-CM
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
## 2013-04-13 NOTE — Progress Notes (Signed)
[facility] provided pt with a [facility] Orange Card application, highlighting [facility].  °

## 2013-04-13 NOTE — ED Notes (Signed)
Pt was found smoking cigarette in the bathroom. When asked about it pt sts "I was in jail I know how to hide things" and admits to hiding cigarettes in his crotch. Box with half of cigarette and a lighter placed to pt's belongings bag in locker 31.

## 2013-04-13 NOTE — Consult Note (Signed)
  Psychiatric Specialty Exam: Physical Exam  ROS  Blood pressure 125/70, pulse 60, temperature 98.3 F (36.8 C), temperature source Oral, resp. rate 16, SpO2 100.00%.There is no weight on file to calculate BMI.  General Appearance: Disheveled  Eye Contact::  Good  Speech:  Clear and Coherent and Normal Rate  Volume:  Normal  Mood:  Depressed  Affect:  Congruent  Thought Process:  Coherent and Logical  Orientation:  Full (Time, Place, and Person)  Thought Content:  Negative  Suicidal Thoughts:  Yes.  with intent/plan  Homicidal Thoughts:  No  Memory:  Immediate;   Good Recent;   Good Remote;   Good  Judgement:  Fair  Insight:  fair  Psychomotor Activity:  Normal  Concentration:  Good  Recall:  Good  Akathisia:  Negative  Handed:  Right  AIMS (if indicated):     Assets:  Communication Skills  Sleep:   impaired   Mr Horrigan says he is still suicidal.  Stressors are more than he can handle.  Says he will continue to use if discharged and plans to cut his wrist.  The recommendation is inpatient at [facility] if possible and if not then whatever facility is available.

## 2013-04-13 NOTE — ED Provider Notes (Signed)
Medical screening examination/treatment/procedure(s) were performed by non-physician practitioner and as supervising physician I was immediately available for consultation/collaboration.   Arminda Foglio S Ark Agrusa, MD 04/13/13 1257 
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
## 2013-04-13 NOTE — [facility] Counselor (Signed)
Pt has been accepted by [facility] Means, NP, pending an avail bed.
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
## 2013-04-13 NOTE — ED Provider Notes (Signed)
Medical screening examination/treatment/procedure(s) were performed by non-physician practitioner and as supervising physician I was immediately available for consultation/collaboration.   Shelda Jakes, MD 04/13/13 1128

## 2013-04-13 NOTE — ED Notes (Signed)
Pt reports feeling anxious, asks for antianxiety medication, stating "i'm trying to avoid dt's. Pt given ativan as per prn order.

## 2013-04-13 NOTE — Treatment Plan (Signed)
Pt has been declined by [facility] medicine and Admin.  Please see recent discharge summary for details.  To say the least, the pt is inappropriate for [facility] as pt has spent the majority of his adult life in jail.  Upon arriving at [facility] he demanded Ativan and threatened staff when he did not receive it.  [employer] had to be called in order to get this pt off [facility] property safely.

## 2013-04-14 DIAGNOSIS — F1110 Opioid abuse, uncomplicated: Secondary | ICD-10-CM

## 2013-04-14 MED ORDER — HYDROXYZINE HCL 25 MG PO TABS
25.0000 mg | ORAL_TABLET | Freq: Four times a day (QID) | ORAL | Status: DC | PRN
  Administered 2013-04-14 – 2013-04-15 (×2): 25 mg via ORAL
  Filled 2013-04-14 (×2): qty 1

## 2013-04-14 MED ORDER — LOPERAMIDE HCL 2 MG PO CAPS: 2.0000 mg | ORAL_CAPSULE | ORAL | Status: DC | PRN

## 2013-04-14 MED ORDER — CLONIDINE HCL 0.1 MG PO TABS
0.1000 mg | ORAL_TABLET | Freq: Four times a day (QID) | ORAL | Status: AC
  Administered 2013-04-14 – 2013-04-16 (×7): 0.1 mg via ORAL
  Filled 2013-04-14 (×7): qty 1

## 2013-04-14 MED ORDER — CLONIDINE HCL 0.1 MG PO TABS: 0.1000 mg | ORAL_TABLET | Freq: Every day | ORAL | Status: DC

## 2013-04-14 MED ORDER — CLONIDINE HCL 0.1 MG PO TABS
0.1000 mg | ORAL_TABLET | ORAL | Status: DC
  Administered 2013-04-16 – 2013-04-17 (×2): 0.1 mg via ORAL
  Filled 2013-04-14 (×2): qty 1

## 2013-04-14 MED ORDER — NAPROXEN 500 MG PO TABS
500.0000 mg | ORAL_TABLET | Freq: Two times a day (BID) | ORAL | Status: DC | PRN
  Administered 2013-04-15 – 2013-04-16 (×4): 500 mg via ORAL
  Filled 2013-04-14 (×4): qty 1

## 2013-04-14 MED ORDER — DICYCLOMINE HCL 20 MG PO TABS
20.0000 mg | ORAL_TABLET | Freq: Four times a day (QID) | ORAL | Status: DC | PRN
  Administered 2013-04-15 (×2): 20 mg via ORAL
  Filled 2013-04-14 (×2): qty 1

## 2013-04-14 MED ORDER — ONDANSETRON 4 MG PO TBDP: 4.0000 mg | ORAL_TABLET | Freq: Four times a day (QID) | ORAL | Status: DC | PRN

## 2013-04-14 MED ORDER — METHOCARBAMOL 500 MG PO TABS
500.0000 mg | ORAL_TABLET | Freq: Three times a day (TID) | ORAL | Status: DC | PRN
  Administered 2013-04-14 – 2013-04-16 (×5): 500 mg via ORAL
  Filled 2013-04-14 (×5): qty 1

## 2013-04-14 NOTE — Progress Notes (Addendum)
Called For Bed availability: [facility]: @1830  Brad requested to fax for review. Faxed [facility]: No male beds available at this time. [facility]: @1749  Shanda Bumps stated male bed available, faxed referral. [facility] @1645  Joni Reining stated beds were available @1921  Joni Reining called back and stated pt is accepted and placed on the (sponsorship waiting list) due to no insurance  Joellyn Grandt Hamer, MHT

## 2013-04-14 NOTE — ED Provider Notes (Signed)
CSN: [id number]     Arrival date & time 04/12/13  2144 History   First MD Initiated Contact with Patient 04/12/13 2203     Chief Complaint  Patient presents with  . SI   . Medical Clearance   (Consider location/radiation/quality/duration/timing/severity/associated sxs/prior Treatment) HPI  Past Medical History  Diagnosis Date  . Anxiety   . Hallux valgus with bunions   . Bipolar 1 disorder   . Mood swings   . Depression    Past Surgical History  Procedure Laterality Date  . Fatty tumor removed from left foot     History reviewed. No pertinent family history. History  Substance Use Topics  . Smoking status: Current Every Day Smoker -- 0.50 packs/day    Types: Cigarettes  . Smokeless tobacco: Not on file  . Alcohol Use: Yes     Comment: heavy     Review of Systems  Allergies  Tramadol  Home Medications   Current Outpatient Rx  Name  Route  Sig  Dispense  Refill  . gabapentin (NEURONTIN) 300 MG capsule   Oral   Take 300 mg by mouth 3 (three) times daily.         Marland Kitchen lithium carbonate 150 MG capsule   Oral   Take 1 capsule (150 mg total) by mouth 2 (two) times daily with a meal. For mood stabilization.   60 capsule   0   . traZODone (DESYREL) 150 MG tablet   Oral   Take 150 mg by mouth at bedtime.          BP 114/69  Pulse 69  Temp(Src) 97.9 F (36.6 C) (Oral)  Resp 17  SpO2 98% Physical Exam  ED Course  Procedures (including critical care time) Labs Review Labs Reviewed  CBC - Abnormal; Notable for the following:    WBC 10.8 (*)    All other components within normal limits  COMPREHENSIVE METABOLIC PANEL - Abnormal; Notable for the following:    Potassium 3.4 (*)    AST 45 (*)    All other components within normal limits  ETHANOL - Abnormal; Notable for the following:    Alcohol, Ethyl (B) 99 (*)    All other components within normal limits  SALICYLATE LEVEL - Abnormal; Notable for the following:    Salicylate Lvl <2.0 (*)    All other  components within normal limits  URINE RAPID DRUG SCREEN (HOSP PERFORMED) - Abnormal; Notable for the following:    Benzodiazepines POSITIVE (*)    Tetrahydrocannabinol POSITIVE (*)    All other components within normal limits  ACETAMINOPHEN LEVEL   Imaging Review No results found.  EKG Interpretation   None       MDM   1. Major depressive disorder, recurrent episode, severe, without mention of psychotic behavior   2. Opioid abuse, continuous   3. Alcohol abuse, continuous        [name], NP 04/14/13 2055  [name], NP 04/14/13 2056
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
## 2013-04-14 NOTE — ED Notes (Signed)
Report given to Terrilyn Saver, RN in psych ED

## 2013-04-15 ENCOUNTER — Encounter (HOSPITAL_COMMUNITY): Payer: Self-pay | Admitting: Psychiatry

## 2013-04-15 DIAGNOSIS — F1120 Opioid dependence, uncomplicated: Secondary | ICD-10-CM | POA: Diagnosis present

## 2013-04-15 DIAGNOSIS — Z765 Malingerer [conscious simulation]: Secondary | ICD-10-CM | POA: Diagnosis present

## 2013-04-15 DIAGNOSIS — F1910 Other psychoactive substance abuse, uncomplicated: Secondary | ICD-10-CM

## 2013-04-15 DIAGNOSIS — F1994 Other psychoactive substance use, unspecified with psychoactive substance-induced mood disorder: Secondary | ICD-10-CM

## 2013-04-15 DIAGNOSIS — F1920 Other psychoactive substance dependence, uncomplicated: Secondary | ICD-10-CM | POA: Diagnosis present

## 2013-04-15 MED ORDER — HYDROXYZINE HCL 25 MG PO TABS
50.0000 mg | ORAL_TABLET | Freq: Four times a day (QID) | ORAL | Status: DC | PRN
  Administered 2013-04-15 – 2013-04-16 (×2): 50 mg via ORAL
  Filled 2013-04-15 (×2): qty 2

## 2013-04-15 MED ORDER — QUETIAPINE FUMARATE 100 MG PO TABS
100.0000 mg | ORAL_TABLET | Freq: Two times a day (BID) | ORAL | Status: DC
  Administered 2013-04-15: 100 mg via ORAL
  Filled 2013-04-15: qty 1

## 2013-04-15 MED ORDER — HALOPERIDOL LACTATE 5 MG/ML IJ SOLN
5.0000 mg | Freq: Four times a day (QID) | INTRAMUSCULAR | Status: DC | PRN
  Filled 2013-04-15: qty 1

## 2013-04-15 MED ORDER — HALOPERIDOL 5 MG PO TABS
5.0000 mg | ORAL_TABLET | Freq: Three times a day (TID) | ORAL | Status: DC
  Administered 2013-04-15 – 2013-04-17 (×6): 5 mg via ORAL
  Filled 2013-04-15 (×6): qty 1

## 2013-04-15 MED ORDER — QUETIAPINE FUMARATE 100 MG PO TABS
100.0000 mg | ORAL_TABLET | Freq: Two times a day (BID) | ORAL | Status: DC | PRN
  Administered 2013-04-16: 100 mg via ORAL
  Filled 2013-04-15: qty 1

## 2013-04-15 NOTE — ED Notes (Addendum)
Pt laying in bed refusing VS, medications.  Angry cursing about the ativan being dc'd.  Pt reports he is not going to take any medications until he talks w/ the Dr.  Marland Kitchenthey took away the one thing that helps...going to tear this room up..you just watch..." Catha Nottingham PA aware

## 2013-04-15 NOTE — Progress Notes (Signed)
Spoke with [place] at [place].  Physician stated pt COW, CIWA scores are too low for detox.  Pt will be review for SI placement at [facility] with physician and callback.  Blain Pais, MHT/NS

## 2013-04-15 NOTE — Progress Notes (Addendum)
Patient informed by this provider that his Ativan was discontinued per the MD and explained the importance of not having addictive type medications during polysubstance detox.  He then stated "I'm just telling you, I have panic attack."  I explained to him that he had other medications in place for panic attacks.  Then he stated, "I'm just telling you, I have mood swings, severe mood swings."  Seroquel ordered and PRN ordered.  Reviewed the information documented and agree with the treatment plan.  Leonor Darnell,JANARDHAHA R. 04/15/2013 6:45 PM

## 2013-04-15 NOTE — Progress Notes (Signed)
Faxed off ED notes, COWS and CIWA scores updated to [facility] for physician review.  Blain Pais, MHT/NS

## 2013-04-15 NOTE — BHH Counselor (Signed)
Per Arne Cleveland, no beds, no dc's on weekends. Denice Bors, [facility] 04/15/2013 9:51 AM

## 2013-04-15 NOTE — ED Provider Notes (Signed)
Medical screening examination/treatment/procedure(s) were performed by non-physician practitioner and as supervising physician I was immediately available for consultation/collaboration.   Junius Argyle, MD 04/15/13 1051

## 2013-04-15 NOTE — ED Notes (Signed)
Up tot he bathroom to shower and change scrubs 

## 2013-04-15 NOTE — Progress Notes (Signed)
[facility] physician will re-run for SI, but pt declined for detox with COW and CIWA at 3 and 4.  Blain Pais, MHT/NS

## 2013-04-15 NOTE — ED Notes (Signed)
Dr Wilkie Aye aware of pt's tooth ache,BP and pulse, and that clonidine was held.

## 2013-04-15 NOTE — ED Notes (Signed)
Patient presents depressed and cooperative. States that he has thoughts of harm, "all the time." Denies any current pain. Denies any auditory or visual hallucinations. Patient currently resting in his bed.

## 2013-04-15 NOTE — Progress Notes (Signed)
Placed phone call to [facility], spoke with Berna Spare, RN, stated patient was not appropriate for their facility because he is an "absconder".  Tomi Bamberger, MHT
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
## 2013-04-15 NOTE — ED Notes (Signed)
[name], watching tv.  Pt reports that he "medically needs" the ativan/xanax..the only thing that works for me...".

## 2013-04-15 NOTE — ED Notes (Signed)
Jamison NP into see 

## 2013-04-15 NOTE — ED Notes (Signed)
Sitting quietly on the bed, pleasant, polite, cooperative.

## 2013-04-15 NOTE — ED Notes (Signed)
Pt sitting on the bed angry, reports that his withdrawal symptoms have increased-no diarrhea, but reports increased pain/anxiety.  Bp has improved will medicate per protocol
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
## 2013-04-16 ENCOUNTER — Encounter (HOSPITAL_COMMUNITY): Payer: Self-pay | Admitting: Registered Nurse

## 2013-04-16 NOTE — ED Notes (Signed)
Up tot he bathroom to shower and change scrubs 

## 2013-04-16 NOTE — BH Assessment (Signed)
Pt has been declined at [facility]. Contacted the following facilities for placement:  [facility]: At capacity [facility]: At capacity [place]:  At capacity [facility]: At capacity [facility]: At [facility]: At capacity [facility]: At capacity [facility]: At capacity [facility]: At capacity  [facility]: Pt is declined. [facility]: Pt is declined.   Harlin Rain Ria Comment, [facility] Triage Specialist
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
## 2013-04-16 NOTE — ED Notes (Signed)
Up to the bathroom 

## 2013-04-16 NOTE — ED Notes (Signed)
Shuvon NP into see 

## 2013-04-16 NOTE — ED Notes (Signed)
Patient presents pleasant, cooperative with depressed mood; denies any current suicidal thoughts but endorses feeling of hopelessness and just not wanting to be here; denies any thoughts of wanting to hurt anyone else or auditory or visual hallucinations.

## 2013-04-16 NOTE — Progress Notes (Signed)
Late entry: calls made 4pm on 04/15/13  CSW contacted following facilities for placement.   [facility]: Per [place], no beds  [place]: Per [facility], no beds  [facility]: declined  [facility]: no beds  [facility], [phone]     ED CSW  9:11am

## 2013-04-17 NOTE — Consult Note (Signed)
Patient Identification:  Robert Kramer Date of Evaluation:  04/17/2013   History of Present Illness: Patient voluntarily  Brought in himself seeking treatment for depression and anxiety.  This am patient was seen by this Clinical research associate and DR Lolly Mustache.  Patient states all he want is prescription for Xanax.  He reports Xanax is the only medication that helps him with his anxiety.  Patient denied SI/HI/AVH.  Patient was not open to starting mood stabilizer but only want Xanax.  We will discharge him home with outpatient referral.  Past Psychiatric History:  Polysubstance dependence   Past Medical History:     Past Medical History  Diagnosis Date  . Anxiety   . Hallux valgus with bunions   . Bipolar 1 disorder   . Mood swings   . Depression        Past Surgical History  Procedure Laterality Date  . Fatty tumor removed from left foot      Allergies:  Allergies  Allergen Reactions  . Tramadol Other (See Comments)    Upset stomach    Current Medications:  Prior to Admission medications   Medication Sig Start Date End Date Taking? Authorizing Provider  gabapentin (NEURONTIN) 300 MG capsule Take 300 mg by mouth 3 (three) times daily.   Yes Historical Provider, MD  lithium carbonate 150 MG capsule Take 1 capsule (150 mg total) by mouth 2 (two) times daily with a meal. For mood stabilization. 04/03/13  Yes Verne Spurr, PA-C  traZODone (DESYREL) 150 MG tablet Take 150 mg by mouth at bedtime.   Yes Historical Provider, MD    Social History:    reports that he has been smoking Cigarettes.  He has been smoking about 0.50 packs per day. He does not have any smokeless tobacco history on file. He reports that he drinks alcohol. He reports that he uses illicit drugs (Marijuana and Cocaine).   Family History:    History reviewed. No pertinent family history.  Mental Status Examination/Evaluation: Psychiatric Specialty Exam: Physical Exam  ROS  Blood pressure 101/64, pulse 45, temperature  97.6 F (36.4 C), temperature source Oral, resp. rate 16, SpO2 96.00%.There is no weight on file to calculate BMI.  General Appearance: Casual  Eye Contact::  Poor  Speech:  Clear and Coherent and Normal Rate  Volume:  Normal  Mood:  Euthymic  Affect:  Appropriate and Congruent  Thought Process:  Coherent and Goal Directed  Orientation:  Full (Time, Place, and Person)  Thought Content:  NA  Suicidal Thoughts:  No  Homicidal Thoughts:  No  Memory:  Immediate;   Good Recent;   Good Remote;   Good  Judgement:  Poor  Insight:  Shallow  Psychomotor Activity:  Normal  Concentration:  Good  Recall:  Good  Akathisia:  NA  Handed:  Right  AIMS (if indicated):     Assets:  Desire for Improvement  Sleep:          DIAGNOSIS:   AXIS I   Polysubstance abuse, substance abuse mood d/o  AXIS II  Deffered  AXIS III See medical notes.  AXIS IV housing problems, occupational problems, other psychosocial or environmental problems, problems related to social environment, problems with access to health care services and problems with primary support group  AXIS V 61-70 mild symptoms     Assessment/Plan:  Consult and face to face interview with Dr Lolly Mustache We will discharge patient today with outpatient referral. Robert Kramer  PMHNP-BC  I have personally seen the  patient and agreed with the findings and involved in the treatment plan. Kathryne Sharper, MD

## 2013-04-17 NOTE — Progress Notes (Signed)
Per discussion with psychiatrist and NP patient psychiatrically stable for discharge home. CSW informed EDP.   Marland KitchenCatha Gosselin, [place] [phone]  ED CSW .04/17/2013 1037am

## 2013-08-17 ENCOUNTER — Emergency Department (HOSPITAL_COMMUNITY)
Admission: EM | Admit: 2013-08-17 | Discharge: 2013-08-17 | Disposition: A | Discharge status: Home / Self Care | Payer: No Typology Code available for payment source | Attending: Emergency Medicine | Admitting: Emergency Medicine

## 2013-08-17 ENCOUNTER — Encounter (HOSPITAL_COMMUNITY): Payer: Self-pay | Admitting: Emergency Medicine

## 2013-08-17 ENCOUNTER — Emergency Department (HOSPITAL_COMMUNITY): Payer: No Typology Code available for payment source

## 2013-08-17 DIAGNOSIS — Z8659 Personal history of other mental and behavioral disorders: Secondary | ICD-10-CM | POA: Insufficient documentation

## 2013-08-17 DIAGNOSIS — IMO0002 Reserved for concepts with insufficient information to code with codable children: Secondary | ICD-10-CM | POA: Insufficient documentation

## 2013-08-17 DIAGNOSIS — W010XXA Fall on same level from slipping, tripping and stumbling without subsequent striking against object, initial encounter: Secondary | ICD-10-CM | POA: Insufficient documentation

## 2013-08-17 DIAGNOSIS — Y9389 Activity, other specified: Secondary | ICD-10-CM | POA: Insufficient documentation

## 2013-08-17 DIAGNOSIS — Z8739 Personal history of other diseases of the musculoskeletal system and connective tissue: Secondary | ICD-10-CM | POA: Insufficient documentation

## 2013-08-17 DIAGNOSIS — F17200 Nicotine dependence, unspecified, uncomplicated: Secondary | ICD-10-CM | POA: Insufficient documentation

## 2013-08-17 DIAGNOSIS — S62619A Displaced fracture of proximal phalanx of unspecified finger, initial encounter for closed fracture: Secondary | ICD-10-CM

## 2013-08-17 DIAGNOSIS — Y929 Unspecified place or not applicable: Secondary | ICD-10-CM | POA: Insufficient documentation

## 2013-08-17 MED ORDER — OXYCODONE-ACETAMINOPHEN 5-325 MG PO TABS
2.0000 | ORAL_TABLET | Freq: Once | ORAL | Status: AC
  Administered 2013-08-17: 2 via ORAL
  Filled 2013-08-17: qty 2

## 2013-08-17 MED ORDER — OXYCODONE-ACETAMINOPHEN 5-325 MG PO TABS: 2.0000 | ORAL_TABLET | ORAL | Status: DC | PRN

## 2013-08-17 NOTE — ED Notes (Signed)
Pt states he was moving firewood last night and tripped. Complain of pain in right hand.

## 2013-08-17 NOTE — Discharge Instructions (Signed)
Cast or Splint Care Casts and splints support injured limbs and keep bones from moving while they heal.  HOME CARE  Keep the cast or splint uncovered during the drying period.  A plaster cast can take 24 to 48 hours to dry.  A fiberglass cast will dry in less than 1 hour.  Do not rest the cast on anything harder than a pillow for 24 hours.  Do not put weight on your injured limb. Do not put pressure on the cast. Wait for your doctor's approval.  Keep the cast or splint dry.  Cover the cast or splint with a plastic bag during baths or wet weather.  If you have a cast over your chest and belly (trunk), take sponge baths until the cast is taken off.  If your cast gets wet, dry it with a towel or blow dryer. Use the cool setting on the blow dryer.  Keep your cast or splint clean. Wash a dirty cast with a damp cloth.  Do not put any objects under your cast or splint.  Do not scratch the skin under the cast with an object. If itching is a problem, use a blow dryer on a cool setting over the itchy area.  Do not trim or cut your cast.  Do not take out the padding from inside your cast.  Exercise your joints near the cast as told by your doctor.  Raise (elevate) your injured limb on 1 or 2 pillows for the first 1 to 3 days. GET HELP IF:  Your cast or splint cracks.  Your cast or splint is too tight or too loose.  You itch badly under the cast.  Your cast gets wet or has a soft spot.  You have a bad smell coming from the cast.  You get an object stuck under the cast.  Your skin around the cast becomes red or sore.  You have new or more pain after the cast is put on. GET HELP RIGHT AWAY IF:  You have fluid leaking through the cast.  You cannot move your fingers or toes.  Your fingers or toes turn blue or white or are cool, painful, or puffy (swollen).  You have tingling or lose feeling (numbness) around the injured area.  You have bad pain or pressure under the  cast.  You have trouble breathing or have shortness of breath.  You have chest pain. Document Released: 10/22/2010 Document Revised: 02/22/2013 Document Reviewed: 12/29/2012 [facility] Patient Information 2014 [employer] [place].  You'll need to followup with orthopedic Dr.    Phone number given. Ice. Elevate. Splint. Pain medication. Can also take ibuprofen 4 tablets 3 times a day.

## 2013-08-17 NOTE — ED Notes (Signed)
Patient informed about signs/symptoms of splint related issues (swelling, sensation changes, color changes). Informed to keep splint on and dry, but could loosen ace wrap if needed. Patient gave verbal understanding.

## 2013-08-17 NOTE — ED Notes (Signed)
Patient with no complaints at this time. Respirations even and unlabored. Skin warm/dry. Discharge instructions reviewed with patient at this time. Patient given opportunity to voice concerns/ask questions. Patient discharged at this time and left Emergency Department with steady gait.   

## 2013-08-17 NOTE — ED Provider Notes (Signed)
CSN: [id number]     Arrival date & time 08/17/13  1227 History   First MD Initiated Contact with Patient 08/17/13 1554     Chief Complaint  Patient presents with  . Hand Injury     (Consider location/radiation/quality/duration/timing/severity/associated sxs/prior Treatment) HPI .... status post accidental fall yesterday hyperextending his right wrist and contusing right hand.  No head or neck trauma. Severity is moderate. Complains of sharp pain in medial aspect of right hand.   Past Medical History  Diagnosis Date  . Anxiety   . Hallux valgus with bunions   . Bipolar 1 disorder   . Mood swings   . Depression    Past Surgical History  Procedure Laterality Date  . Fatty tumor removed from left foot     No family history on file. History  Substance Use Topics  . Smoking status: Current Every Day Smoker -- 0.50 packs/day    Types: Cigarettes  . Smokeless tobacco: Not on file  . Alcohol Use: Yes     Comment: heavy     Review of Systems  All other systems reviewed and are negative.      Allergies  Tramadol  Home Medications   Current Outpatient Rx  Name  Route  Sig  Dispense  Refill  . acetaminophen (TYLENOL) 500 MG tablet   Oral   Take 1,000 mg by mouth every 6 (six) hours as needed for moderate pain.         Marland Kitchen. oxyCODONE-acetaminophen (PERCOCET) 5-325 MG per tablet   Oral   Take 2 tablets by mouth every 4 (four) hours as needed.   25 tablet   0    BP 117/87  Pulse 78  Temp(Src) 97.4 F (36.3 C) (Oral)  Resp 19  Ht 5\' 11"  (1.803 m)  Wt 175 lb 1.6 oz (79.425 kg)  BMI 24.43 kg/m2  SpO2 94% Physical Exam  Nursing note and vitals reviewed. Constitutional: He is oriented to person, place, and time. He appears well-developed and well-nourished.  HENT:  Head: Normocephalic and atraumatic.  Eyes: Conjunctivae and EOM are normal. Pupils are equal, round, and reactive to light.  Neck: Normal range of motion. Neck supple.  Cardiovascular: Normal rate,  regular rhythm and normal heart sounds.   Pulmonary/Chest: Effort normal and breath sounds normal.  Abdominal: Soft. Bowel sounds are normal.  Musculoskeletal:  Right hand: Most tender over her mid phalanx of fifth digit.  Neurological: He is alert and oriented to person, place, and time.  Skin: Skin is warm and dry.  Psychiatric: He has a normal mood and affect. His behavior is normal.    ED Course  Procedures (including critical care time) Labs Review Labs Reviewed - No data to display Imaging Review Dg Hand Complete Right  08/17/2013   CLINICAL DATA:  Right hand pain after injury.  EXAM: RIGHT HAND - COMPLETE 3+ VIEW  COMPARISON:  Nov 27, 2012.  FINDINGS: Deformity of fifth metacarpal is noted consistent with old fracture. Mildly displaced and angulated fracture is seen involving the fifth proximal phalanx. Joint spaces are intact.  IMPRESSION: Mildly displaced and angulated fifth proximal phalangeal fracture.   Electronically Signed   By: Roque LiasJames  Green M.D.   On: 08/17/2013 13:38    EKG Interpretation   None       MDM   Final diagnoses:  Fracture of proximal phalanx of right hand    Plain films of right hand reveal a mildly displaced and angulated fifth proximal phalangeal fracture. Ulnar  gutter splint. Pain medication. Referral to orthopedics.    Donnetta Hutching, MD 08/19/13 [phone]

## 2013-08-21 ENCOUNTER — Telehealth: Payer: Self-pay | Admitting: Orthopedic Surgery

## 2013-08-21 ENCOUNTER — Encounter (HOSPITAL_COMMUNITY): Payer: Self-pay | Admitting: Emergency Medicine

## 2013-08-21 ENCOUNTER — Emergency Department (HOSPITAL_COMMUNITY)
Admission: EM | Admit: 2013-08-21 | Discharge: 2013-08-21 | Disposition: A | Discharge status: Home / Self Care | Payer: No Typology Code available for payment source | Attending: Emergency Medicine | Admitting: Emergency Medicine

## 2013-08-21 DIAGNOSIS — S62619A Displaced fracture of proximal phalanx of unspecified finger, initial encounter for closed fracture: Secondary | ICD-10-CM

## 2013-08-21 DIAGNOSIS — M79609 Pain in unspecified limb: Secondary | ICD-10-CM | POA: Insufficient documentation

## 2013-08-21 DIAGNOSIS — Z8659 Personal history of other mental and behavioral disorders: Secondary | ICD-10-CM | POA: Insufficient documentation

## 2013-08-21 DIAGNOSIS — G8911 Acute pain due to trauma: Secondary | ICD-10-CM | POA: Insufficient documentation

## 2013-08-21 DIAGNOSIS — Z760 Encounter for issue of repeat prescription: Secondary | ICD-10-CM | POA: Insufficient documentation

## 2013-08-21 DIAGNOSIS — F17200 Nicotine dependence, unspecified, uncomplicated: Secondary | ICD-10-CM | POA: Insufficient documentation

## 2013-08-21 MED ORDER — OXYCODONE-ACETAMINOPHEN 5-325 MG PO TABS: 1.0000 | ORAL_TABLET | ORAL | Status: DC | PRN

## 2013-08-21 MED ORDER — IBUPROFEN 600 MG PO TABS: 600.0000 mg | ORAL_TABLET | Freq: Four times a day (QID) | ORAL | Status: DC | PRN

## 2013-08-21 NOTE — ED Notes (Signed)
Patient c/o right hand pain. Per patient was seen here for fractured hand. Patient referred to Dr Hilda LiasKeeling but unable to make appointment because Dr Hilda LiasKeeling has been out of office x1 week. Per patient can't see him till end of week. Patient reports out of pain medication. Per patient was prescribed Percocet 5mg /325mg .

## 2013-08-21 NOTE — Discharge Instructions (Signed)
Finger Fracture Fractures of fingers are breaks in the bones of the fingers. There are many types of fractures. There are different ways of treating these fractures. Your health care provider will discuss the best way to treat your fracture. CAUSES Traumatic injury is the main cause of broken fingers. These include:  Injuries while playing sports.  Workplace injuries.  Falls. RISK FACTORS Activities that can increase your risk of finger fractures include:  Sports.  Workplace activities that involve machinery.  A condition called osteoporosis, which can make your bones less dense and cause them to fracture more easily. SIGNS AND SYMPTOMS The main symptoms of a broken finger are pain and swelling within 15 minutes after the injury. Other symptoms include:  Bruising of your finger.  Stiffness of your finger.  Numbness of your finger.  Exposed bones (compound fracture) if the fracture is severe. DIAGNOSIS  The best way to diagnose a broken bone is with X-ray imaging. Additionally, your health care provider will use this X-ray image to evaluate the position of the broken finger bones.  TREATMENT  Finger fractures can be treated with:   Nonreduction This means the bones are in place. The finger is splinted without changing the positions of the bone pieces. The splint is usually left on for about a week to 10 days. This will depend on your fracture and what your health care provider thinks.  Closed reduction The bones are put back into position without using surgery. The finger is then splinted.  Open reduction and internal fixation The fracture site is opened. Then the bone pieces are fixed into place with pins or some type of hardware. This is seldom required. It depends on the severity of the fracture. HOME CARE INSTRUCTIONS   Follow your health care provider's instructions regarding activities, exercises, and physical therapy.  Only take over-the-counter or prescription  medicines for pain, discomfort, or fever as directed by your health care provider. SEEK MEDICAL CARE IF: You have pain or swelling that limits the motion or use of your fingers. SEEK IMMEDIATE MEDICAL CARE IF:  Your finger becomes numb. MAKE SURE YOU:   Understand these instructions.  Will watch your condition.  Will get help right away if you are not doing well or get worse. Document Released: 10/04/2000 Document Revised: 04/12/2013 Document Reviewed: 02/01/2013 [facility] Patient Information 2014 [employer], [place].   Continue wearing your splint.  You may try a heating pad to see if that helps improve pain.  Do not drive within 4 to taking oxycodone as this will make you drowsy.  You may try contacting Kathie RhodesBetty Ratliffe with the hospital for assistance with your charges as discussed.

## 2013-08-21 NOTE — ED Notes (Addendum)
Rt Hand fx on 2/12,  Out of pain med. Unable to see Dr Hilda LiasKeeling.  Has a splint on hand
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
## 2013-08-22 NOTE — ED Provider Notes (Signed)
Medical screening examination/treatment/procedure(s) were performed by non-physician practitioner and as supervising physician I was immediately available for consultation/collaboration.  Tmya Wigington L Ziya Coonrod, MD 08/22/13 2359 

## 2013-08-28 NOTE — Telephone Encounter (Signed)
No further response from patient as of this date.  He was aware we could schedule if he wishes.

## 2013-08-30 ENCOUNTER — Emergency Department (HOSPITAL_COMMUNITY)
Admission: EM | Admit: 2013-08-30 | Discharge: 2013-08-30 | Disposition: A | Discharge status: Home / Self Care | Payer: No Typology Code available for payment source | Attending: Emergency Medicine | Admitting: Emergency Medicine

## 2013-08-30 ENCOUNTER — Encounter (HOSPITAL_COMMUNITY): Payer: Self-pay | Admitting: Emergency Medicine

## 2013-08-30 DIAGNOSIS — G8918 Other acute postprocedural pain: Secondary | ICD-10-CM | POA: Insufficient documentation

## 2013-08-30 DIAGNOSIS — M79643 Pain in unspecified hand: Secondary | ICD-10-CM

## 2013-08-30 DIAGNOSIS — F17200 Nicotine dependence, unspecified, uncomplicated: Secondary | ICD-10-CM | POA: Insufficient documentation

## 2013-08-30 DIAGNOSIS — M25549 Pain in joints of unspecified hand: Secondary | ICD-10-CM | POA: Insufficient documentation

## 2013-08-30 DIAGNOSIS — R209 Unspecified disturbances of skin sensation: Secondary | ICD-10-CM | POA: Insufficient documentation

## 2013-08-30 DIAGNOSIS — Z8659 Personal history of other mental and behavioral disorders: Secondary | ICD-10-CM | POA: Insufficient documentation

## 2013-08-30 MED ORDER — HYDROCODONE-ACETAMINOPHEN 5-325 MG PO TABS: ORAL_TABLET | ORAL | Status: DC

## 2013-08-30 MED ORDER — IBUPROFEN 800 MG PO TABS: 800.0000 mg | ORAL_TABLET | Freq: Three times a day (TID) | ORAL | Status: DC

## 2013-08-30 MED ORDER — OXYCODONE-ACETAMINOPHEN 5-325 MG PO TABS
1.0000 | ORAL_TABLET | Freq: Once | ORAL | Status: AC
  Administered 2013-08-30: 1 via ORAL
  Filled 2013-08-30: qty 1
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
## 2013-09-05 NOTE — ED Provider Notes (Signed)
Medical screening examination/treatment/procedure(s) were performed by non-physician practitioner and as supervising physician I was immediately available for consultation/collaboration.   EKG Interpretation None        Kerstie Agent Braun, MD 09/05/13 1605 

## 2013-09-19 ENCOUNTER — Emergency Department (HOSPITAL_COMMUNITY)
Admission: EM | Admit: 2013-09-19 | Discharge: 2013-09-19 | Disposition: A | Discharge status: Home / Self Care | Payer: No Typology Code available for payment source | Attending: Emergency Medicine | Admitting: Emergency Medicine

## 2013-09-19 ENCOUNTER — Encounter (HOSPITAL_COMMUNITY): Payer: Self-pay | Admitting: Emergency Medicine

## 2013-09-19 ENCOUNTER — Emergency Department (HOSPITAL_COMMUNITY): Payer: No Typology Code available for payment source

## 2013-09-19 DIAGNOSIS — Y9389 Activity, other specified: Secondary | ICD-10-CM | POA: Insufficient documentation

## 2013-09-19 DIAGNOSIS — S6990XA Unspecified injury of unspecified wrist, hand and finger(s), initial encounter: Secondary | ICD-10-CM

## 2013-09-19 DIAGNOSIS — F17200 Nicotine dependence, unspecified, uncomplicated: Secondary | ICD-10-CM | POA: Insufficient documentation

## 2013-09-19 DIAGNOSIS — Y929 Unspecified place or not applicable: Secondary | ICD-10-CM | POA: Insufficient documentation

## 2013-09-19 DIAGNOSIS — Z8739 Personal history of other diseases of the musculoskeletal system and connective tissue: Secondary | ICD-10-CM | POA: Insufficient documentation

## 2013-09-19 DIAGNOSIS — W230XXA Caught, crushed, jammed, or pinched between moving objects, initial encounter: Secondary | ICD-10-CM | POA: Insufficient documentation

## 2013-09-19 DIAGNOSIS — Z8659 Personal history of other mental and behavioral disorders: Secondary | ICD-10-CM | POA: Insufficient documentation

## 2013-09-19 MED ORDER — OXYCODONE-ACETAMINOPHEN 5-325 MG PO TABS: 2.0000 | ORAL_TABLET | Freq: Four times a day (QID) | ORAL | Status: DC | PRN

## 2013-09-19 MED ORDER — HYDROCODONE-ACETAMINOPHEN 5-325 MG PO TABS
2.0000 | ORAL_TABLET | Freq: Once | ORAL | Status: AC
  Administered 2013-09-19: 2 via ORAL
  Filled 2013-09-19: qty 2

## 2013-09-19 NOTE — ED Provider Notes (Signed)
CSN: [id number]     Arrival date & time 09/19/13  1825 History   First MD Initiated Contact with Patient 09/19/13 1859     Chief Complaint  Patient presents with  . Hand Pain     (Consider location/radiation/quality/duration/timing/severity/associated sxs/prior Treatment) Patient is a 38 y.o. male presenting with hand pain. The history is provided by the patient. No language interpreter was used.  Hand Pain This is a recurrent problem. Associated symptoms include arthralgias and joint swelling. Pertinent negatives include no chills, fever, nausea, numbness or weakness.   Pt is a 38 year old male who had a previous hand injury and was cutting wood today and got the same hand caught between the wood and handle of his chain saw. He reports that he wants to get an x-ray due to the fact that he has previously injured this hand. He reports some mild swelling and discomfort. No numbness or tingling.   Past Medical History  Diagnosis Date  . Anxiety   . Hallux valgus with bunions   . Bipolar 1 disorder   . Mood swings   . Depression    Past Surgical History  Procedure Laterality Date  . Fatty tumor removed from left foot     Family History  Problem Relation Age of Onset  . Cancer Father    History  Substance Use Topics  . Smoking status: Current Every Day Smoker -- 0.50 packs/day for 5 years    Types: Cigarettes  . Smokeless tobacco: Never Used  . Alcohol Use: No    Review of Systems  Constitutional: Negative for fever and chills.  Gastrointestinal: Negative for nausea.  Musculoskeletal: Positive for arthralgias and joint swelling.  Neurological: Negative for weakness and numbness.  All other systems reviewed and are negative.      Allergies  Tramadol  Home Medications   Current Outpatient Rx  Name  Route  Sig  Dispense  Refill  . acetaminophen (TYLENOL) 500 MG tablet   Oral   Take 1,000 mg by mouth every 6 (six) hours as needed for moderate pain.           Pulse 82  Temp(Src) 98.1 F (36.7 C)  Resp 20  Ht 5\' 11"  (1.803 m)  Wt 175 lb (79.379 kg)  BMI 24.42 kg/m2  SpO2 98% Physical Exam  Nursing note and vitals reviewed. Constitutional: He is oriented to person, place, and time. He appears well-developed and well-nourished. No distress.  Well-appearing   HENT:  Head: Normocephalic and atraumatic.  Eyes: Conjunctivae and EOM are normal.  Neck: Normal range of motion. Neck supple. No JVD present. No tracheal deviation present. No thyromegaly present.  Cardiovascular: Normal rate, regular rhythm and normal heart sounds.   Pulmonary/Chest: Effort normal and breath sounds normal. No respiratory distress. He has no wheezes.  Musculoskeletal:  Limited ROM of 5th digit, right hand . Moderate swelling 5th digit proximally and TTP. Good sensation of fingertips and brisk capillary refill. 2+ distal pulses. No numbness or tingling.    Lymphadenopathy:    He has no cervical adenopathy.  Neurological: He is alert and oriented to person, place, and time.  Skin: Skin is warm and dry.  Psychiatric: He has a normal mood and affect. His behavior is normal. Judgment and thought content normal.    ED Course  Procedures (including critical care time) Labs Review Labs Reviewed - No data to display Imaging Review Dg Wrist Complete Right  09/19/2013   CLINICAL DATA:  Trauma while cutting  on a tree, right wrist pain  EXAM: RIGHT WRIST - COMPLETE 3+ VIEW  COMPARISON:  None.  FINDINGS: There is no evidence of fracture or dislocation. There is no evidence of arthropathy or other focal bone abnormality. Soft tissues are unremarkable.  IMPRESSION: Negative.   Electronically Signed   By: Esperanza Heir M.D.   On: 09/19/2013 19:33   Dg Hand Complete Right  09/19/2013   CLINICAL DATA:  Patient describes trauma while cutting dominant tree, complaining of right hand and wrist pain, swelling fifth digit  EXAM: RIGHT HAND - COMPLETE 3+ VIEW  COMPARISON:  DG HAND  COMPLETE 3+V*R* dated 09/07/2013; DG HAND COMPLETE*R* dated 08/17/2013  FINDINGS: There is chronic appearing deformity of the fifth metacarpal likely from prior fracture. There is fracture involving the proximal shaft of the fifth proximal phalanx. There is mild displacement of the fracture site which appears similar to both prior studies. There has been interval progressive development of surrounding callus formation/periosteal reaction. There remains mild apex anterior angulation at the fracture site. There is minimal ulnar displacement of the distal fracture fragment which was not present on 09/07/2013.  IMPRESSION: Callus formation suggesting partial interval healing of a still visible fracture involving the fifth proximal phalanx. There is minimal displacement that was not present previously on 09/07/2013 suggesting the possibility of re-injury.   Electronically Signed   By: Esperanza Heir M.D.   On: 09/19/2013 19:30     EKG Interpretation None      MDM   Final diagnoses:  Hand injury    Ulnar gutter splint right hand. 5th proximal phalanx fracture, minimal displacement. Discussed plan with pt to wear ulnar gutter splint and go see ortho or PCP. Follow-up in 1-2 weeks is advised. Ibuprofen for pain and discomfort. Oxycodone for mod-severe pain. Return precautions given.       Irish Elders, NP 09/26/13 2240

## 2013-09-19 NOTE — Discharge Instructions (Signed)
Finger Fracture Fractures of fingers are breaks in the bones of the fingers. There are many types of fractures. There are different ways of treating these fractures. Your health care provider will discuss the best way to treat your fracture. CAUSES Traumatic injury is the main cause of broken fingers. These include:  Injuries while playing sports.  Workplace injuries.  Falls. RISK FACTORS Activities that can increase your risk of finger fractures include:  Sports.  Workplace activities that involve machinery.  A condition called osteoporosis, which can make your bones less dense and cause them to fracture more easily. SIGNS AND SYMPTOMS The main symptoms of a broken finger are pain and swelling within 15 minutes after the injury. Other symptoms include:  Bruising of your finger.  Stiffness of your finger.  Numbness of your finger.  Exposed bones (compound fracture) if the fracture is severe. DIAGNOSIS  The best way to diagnose a broken bone is with X-ray imaging. Additionally, your health care provider will use this X-ray image to evaluate the position of the broken finger bones.  TREATMENT  Finger fractures can be treated with:   Nonreduction This means the bones are in place. The finger is splinted without changing the positions of the bone pieces. The splint is usually left on for about a week to 10 days. This will depend on your fracture and what your health care provider thinks.  Closed reduction The bones are put back into position without using surgery. The finger is then splinted.  Open reduction and internal fixation The fracture site is opened. Then the bone pieces are fixed into place with pins or some type of hardware. This is seldom required. It depends on the severity of the fracture. HOME CARE INSTRUCTIONS   Follow your health care provider's instructions regarding activities, exercises, and physical therapy.  Only take over-the-counter or prescription  medicines for pain, discomfort, or fever as directed by your health care provider. SEEK MEDICAL CARE IF: You have pain or swelling that limits the motion or use of your fingers. SEEK IMMEDIATE MEDICAL CARE IF:  Your finger becomes numb. MAKE SURE YOU:   Understand these instructions.  Will watch your condition.  Will get help right away if you are not doing well or get worse. Document Released: 10/04/2000 Document Revised: 04/12/2013 Document Reviewed: 02/01/2013 [facility] Patient Information 2014 [place], [place].  RICE: Routine Care for Injuries The routine care of many injuries includes Rest, Ice, Compression, and Elevation (RICE). HOME CARE INSTRUCTIONS  Rest is needed to allow your body to heal. Routine activities can usually be resumed when comfortable. Injured tendons and bones can take up to 6 weeks to heal. Tendons are the cord-like structures that attach muscle to bone.  Ice following an injury helps keep the swelling down and reduces pain.  Put ice in a plastic bag.  Place a towel between your skin and the bag.  Leave the ice on for 15-20 minutes, 03-04 times a day. Do this while awake, for the first 24 to 48 hours. After that, continue as directed by your caregiver.  Compression helps keep swelling down. It also gives support and helps with discomfort. If an elastic bandage has been applied, it should be removed and reapplied every 3 to 4 hours. It should not be applied tightly, but firmly enough to keep swelling down. Watch fingers or toes for swelling, bluish discoloration, coldness, numbness, or excessive pain. If any of these problems occur, remove the bandage and reapply loosely. Contact your caregiver if  these problems continue.  Elevation helps reduce swelling and decreases pain. With extremities, such as the arms, hands, legs, and feet, the injured area should be placed near or above the level of the heart, if possible. SEEK IMMEDIATE MEDICAL CARE IF:  You have  persistent pain and swelling.  You develop redness, numbness, or unexpected weakness.  Your symptoms are getting worse rather than improving after several days. These symptoms may indicate that further evaluation or further X-rays are needed. Sometimes, X-rays may not show a small broken bone (fracture) until 1 week or 10 days later. Make a follow-up appointment with your caregiver. Ask when your X-ray results will be ready. Make sure you get your X-ray results. Document Released: 10/04/2000 Document Revised: 09/14/2011 Document Reviewed: 11/21/2010 [facility] Patient Information 2014 [place].  Follow RICE instructions Take ibuprofen  Follow-up with ortho or return here in 1 week- 10 days Take percocet for severe pain

## 2013-09-19 NOTE — ED Notes (Signed)
Patient c/o right hand pain. Per patient hand was already broken and he has not been able to see orthopedic. Per patient cutting wood today and got hand wedged between wood and chainsaw handle.

## 2013-09-19 NOTE — ED Notes (Signed)
Pt alert & oriented x4, stable gait. Patient  given discharge instructions, paperwork & prescription(s). Patient instructed to stop at the registration desk to finish any additional paperwork. Patient  verbalized understanding. Pt left department w/ no further questions & states his ride is in the waiting room.

## 2013-09-27 NOTE — ED Provider Notes (Signed)
Medical screening examination/treatment/procedure(s) were conducted as a shared visit with non-physician practitioner(s) and myself.  I personally evaluated the patient during the encounter.   EKG Interpretation None     Plain films of right hand show callus formation suggesting interval healing of the fifth proximal phalanx of the right hand. Ulnar gutter splint. Pain management. Followup 1-2 weeks to  Donnetta HutchingBrian Elbridge Magowan, MD 09/27/13 1229

## 2014-02-23 ENCOUNTER — Emergency Department (HOSPITAL_COMMUNITY)
Admission: EM | Admit: 2014-02-23 | Discharge: 2014-02-23 | Disposition: A | Discharge status: Home / Self Care | Payer: Self-pay | Attending: Emergency Medicine | Admitting: Emergency Medicine

## 2014-02-23 ENCOUNTER — Emergency Department (HOSPITAL_COMMUNITY): Payer: Self-pay

## 2014-02-23 ENCOUNTER — Encounter (HOSPITAL_COMMUNITY): Payer: Self-pay | Admitting: Emergency Medicine

## 2014-02-23 DIAGNOSIS — S93602A Unspecified sprain of left foot, initial encounter: Secondary | ICD-10-CM

## 2014-02-23 DIAGNOSIS — T07XXXA Unspecified multiple injuries, initial encounter: Secondary | ICD-10-CM

## 2014-02-23 DIAGNOSIS — F17200 Nicotine dependence, unspecified, uncomplicated: Secondary | ICD-10-CM | POA: Insufficient documentation

## 2014-02-23 DIAGNOSIS — Y929 Unspecified place or not applicable: Secondary | ICD-10-CM | POA: Insufficient documentation

## 2014-02-23 DIAGNOSIS — S298XXA Other specified injuries of thorax, initial encounter: Secondary | ICD-10-CM | POA: Insufficient documentation

## 2014-02-23 DIAGNOSIS — S93409A Sprain of unspecified ligament of unspecified ankle, initial encounter: Secondary | ICD-10-CM | POA: Insufficient documentation

## 2014-02-23 DIAGNOSIS — IMO0002 Reserved for concepts with insufficient information to code with codable children: Secondary | ICD-10-CM | POA: Insufficient documentation

## 2014-02-23 DIAGNOSIS — Y9389 Activity, other specified: Secondary | ICD-10-CM | POA: Insufficient documentation

## 2014-02-23 DIAGNOSIS — S93401A Sprain of unspecified ligament of right ankle, initial encounter: Secondary | ICD-10-CM

## 2014-02-23 DIAGNOSIS — S93609A Unspecified sprain of unspecified foot, initial encounter: Secondary | ICD-10-CM | POA: Insufficient documentation

## 2014-02-23 DIAGNOSIS — S20211A Contusion of right front wall of thorax, initial encounter: Secondary | ICD-10-CM

## 2014-02-23 DIAGNOSIS — S20219A Contusion of unspecified front wall of thorax, initial encounter: Secondary | ICD-10-CM | POA: Insufficient documentation

## 2014-02-23 DIAGNOSIS — Z8659 Personal history of other mental and behavioral disorders: Secondary | ICD-10-CM | POA: Insufficient documentation

## 2014-02-23 MED ORDER — HYDROCODONE-ACETAMINOPHEN 5-325 MG PO TABS
2.0000 | ORAL_TABLET | Freq: Once | ORAL | Status: AC
  Administered 2014-02-23: 2 via ORAL
  Filled 2014-02-23: qty 2

## 2014-02-23 MED ORDER — OXYCODONE-ACETAMINOPHEN 5-325 MG PO TABS: 1.0000 | ORAL_TABLET | Freq: Four times a day (QID) | ORAL | Status: DC | PRN

## 2014-02-23 MED ORDER — HYDROCODONE-ACETAMINOPHEN 5-325 MG PO TABS: 1.0000 | ORAL_TABLET | Freq: Four times a day (QID) | ORAL | Status: DC | PRN
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
## 2014-02-23 NOTE — Discharge Instructions (Signed)
Local wound care with dressing changes and bacitracin twice daily.  Return to the emergency department if your wounds develop increased redness or pus.  Percocet as prescribed as needed for pain.   Bicycling, Adult Cyclists  Whether motivated by recreation or transportation, more adults are taking up cycling than ever before. Learning more about cycling greatly increases confidence. And it can be a great aid in learning to share the road more effectively.  If you are using your bicycle in different situations than you previously have, such as switching from occasional short recreational rides to regularly commuting to work, you may want to take a short workshop. Begin by assessing yourself: How confident are you in your cycling skills? What would you like to know more about? Are there particular kinds of cycling you would like to try out? Courses and workshops may focus on learning to race, long distance touring, teaching children to cycle safely, commuting, or bike repairs. With that in mind, adult cyclists may wish to check around their community for bike clubs, classes, rides, and other cycling opportunities. Check with the League of [employer] Bicyclists (www.bikeleague.org) for a listing of instructional opportunities available in your area.  Brush up on riding skills and rules if it has been a while since you cycled regularly.  Adult cyclists who wish to cycle with small children, and cyclists needing to transport cargo, should investigate the various child seats and trailers available. Determine which are the safest and which will work best for you.  Adult cyclists should learn more about off-road cycling, touring, and racing before participating in these activities. Adult cyclists are encouraged to try cycling on multi-use paths. Remember to respect others' needs on the trails.  Do not underestimate the importance of wearing a helmet. Accidents can happen anywhere. Cyclists should always wear  a helmet.  Adult cyclists should learn how to handle harassment from motorists and others in traffic. It is in your best interest not to return any harassment or insults.  Just like a car, a bicycle requires basic maintenance to keep running smoothly and safely. Bikes are easy to work on and you can save money by learning bike maintenance. This can be done by picking up a manual and taking a repair course. Those who really do not have time should keep their bicycles regularly serviced at a good bike shop.  Bicycling can fit into one's everyday life. Substitute a bike ride for a car trip. Adult cyclists should know the health and environmental benefits of bicycling. Document Released: 09/12/2003 Document Revised: 11/06/2013 Document Reviewed: 06/18/2008 [facility] LPExitCare Patient Information 2015 [place], [place]. This information is not intended to replace advice given to you by your health care provider. Make sure you discuss any questions you have with your health care provider.

## 2014-03-26 ENCOUNTER — Emergency Department (HOSPITAL_COMMUNITY): Payer: Self-pay

## 2014-03-26 ENCOUNTER — Encounter (HOSPITAL_COMMUNITY): Payer: Self-pay | Admitting: Emergency Medicine

## 2014-03-26 ENCOUNTER — Emergency Department (HOSPITAL_COMMUNITY)
Admission: EM | Admit: 2014-03-26 | Discharge: 2014-03-26 | Disposition: A | Discharge status: Home / Self Care | Payer: Self-pay | Attending: Emergency Medicine | Admitting: Emergency Medicine

## 2014-03-26 DIAGNOSIS — Z79899 Other long term (current) drug therapy: Secondary | ICD-10-CM | POA: Insufficient documentation

## 2014-03-26 DIAGNOSIS — Z8659 Personal history of other mental and behavioral disorders: Secondary | ICD-10-CM | POA: Insufficient documentation

## 2014-03-26 DIAGNOSIS — R109 Unspecified abdominal pain: Secondary | ICD-10-CM

## 2014-03-26 DIAGNOSIS — R319 Hematuria, unspecified: Secondary | ICD-10-CM | POA: Insufficient documentation

## 2014-03-26 DIAGNOSIS — Z8739 Personal history of other diseases of the musculoskeletal system and connective tissue: Secondary | ICD-10-CM | POA: Insufficient documentation

## 2014-03-26 DIAGNOSIS — F1310 Sedative, hypnotic or anxiolytic abuse, uncomplicated: Secondary | ICD-10-CM | POA: Insufficient documentation

## 2014-03-26 DIAGNOSIS — R1032 Left lower quadrant pain: Secondary | ICD-10-CM | POA: Insufficient documentation

## 2014-03-26 LAB — URINALYSIS, ROUTINE W REFLEX MICROSCOPIC
Bilirubin Urine: NEGATIVE
GLUCOSE, UA: NEGATIVE mg/dL
KETONES UR: NEGATIVE mg/dL
Leukocytes, UA: NEGATIVE
Nitrite: NEGATIVE
Protein, ur: NEGATIVE mg/dL
Specific Gravity, Urine: 1.015 (ref 1.005–1.030)
Urobilinogen, UA: 0.2 mg/dL (ref 0.0–1.0)
pH: 7 (ref 5.0–8.0)

## 2014-03-26 LAB — URINE MICROSCOPIC-ADD ON

## 2014-03-26 MED ORDER — SODIUM CHLORIDE 0.9 % IV BOLUS (SEPSIS): 500.0000 mL | Freq: Once | INTRAVENOUS | Status: DC

## 2014-03-26 MED ORDER — KETOROLAC TROMETHAMINE 30 MG/ML IJ SOLN
30.0000 mg | Freq: Once | INTRAMUSCULAR | Status: AC
  Administered 2014-03-26: 30 mg via INTRAVENOUS
  Filled 2014-03-26: qty 1

## 2014-03-26 MED ORDER — MORPHINE SULFATE 4 MG/ML IJ SOLN
4.0000 mg | Freq: Once | INTRAMUSCULAR | Status: AC
  Administered 2014-03-26: 4 mg via INTRAVENOUS
  Filled 2014-03-26: qty 1

## 2014-03-26 MED ORDER — KETOROLAC TROMETHAMINE 10 MG PO TABS: 10.0000 mg | ORAL_TABLET | Freq: Four times a day (QID) | ORAL | Status: DC | PRN

## 2014-03-26 MED ORDER — OXYCODONE-ACETAMINOPHEN 5-325 MG PO TABS: 2.0000 | ORAL_TABLET | ORAL | Status: DC | PRN

## 2014-03-26 MED ORDER — KETOROLAC TROMETHAMINE 30 MG/ML IJ SOLN
15.0000 mg | Freq: Once | INTRAMUSCULAR | Status: AC
  Administered 2014-03-26: 15 mg via INTRAVENOUS
  Filled 2014-03-26: qty 1

## 2014-03-26 NOTE — ED Notes (Signed)
Pt c/o bilateral back and groin pain x 3 days with hematuria today. States he has h/s kidney stones.

## 2014-03-26 NOTE — ED Provider Notes (Signed)
CSN: [id number]     Arrival date & time 03/26/14  1801 History  This chart was scribed for Robert Hutching, MD by Freida Busman, ED Scribe. This patient was seen in room APA03/APA03 and the patient's care was started 7:00 PM.  Chief Complaint  Patient presents with  . Flank Pain     The history is provided by the patient. No language interpreter was used.    HPI Comments:  Robert Kramer is a 38 y.o. male who presents to the Emergency Department complaining of moderate LLQ abdominal pain that radiates to his left flank and groin with onset ~3 days ago. Pt reports associated mild hematuria for the past 2 days, as well as urinary frequency and decreased urinary output. Pt also notes he saw something solid pass when he urinated. States he felt like he "peed out a rock". Pt reports a h/o kidney stone about 5 months ago, sts pain today is similar. No alleviating factors noted.   Past Medical History  Diagnosis Date  . Anxiety   . Hallux valgus with bunions   . Bipolar 1 disorder   . Mood swings   . Depression    Past Surgical History  Procedure Laterality Date  . Fatty tumor removed from left foot     Family History  Problem Relation Age of Onset  . Cancer Father    History  Substance Use Topics  . Smoking status: Current Every Day Smoker -- 0.50 packs/day for 5 years    Types: Cigarettes  . Smokeless tobacco: Never Used  . Alcohol Use: No    Review of Systems  At least 10pt or greater review of systems completed and are negative except where specified in the HPI.    Allergies  Tramadol  Home Medications   Prior to Admission medications   Medication Sig Start Date End Date Taking? Authorizing Provider  ketorolac (TORADOL) 10 MG tablet Take 1 tablet (10 mg total) by mouth every 6 (six) hours as needed. 03/26/14   Robert Hutching, MD  oxyCODONE-acetaminophen (PERCOCET) 5-325 MG per tablet Take 1-2 tablets by mouth every 6 (six) hours as needed. 02/23/14   Geoffery Lyons, MD   oxyCODONE-acetaminophen (PERCOCET) 5-325 MG per tablet Take 2 tablets by mouth every 4 (four) hours as needed. 03/26/14   Robert Hutching, MD   BP 126/88  Pulse 71  Temp(Src) 98.9 F (37.2 C)  Resp 18  Ht  (1.803 m)  Wt 175 lb (79.379 kg)  BMI 24.42 kg/m2  SpO2 100% Physical Exam  Nursing note and vitals reviewed. Constitutional: He is oriented to person, place, and time. He appears well-developed and well-nourished.  HENT:  Head: Normocephalic and atraumatic.  Eyes: Conjunctivae and EOM are normal. Pupils are equal, round, and reactive to light.  Neck: Normal range of motion. Neck supple.  Cardiovascular: Normal rate, regular rhythm and normal heart sounds.   Pulmonary/Chest: Effort normal and breath sounds normal.  Abdominal: Soft. Bowel sounds are normal.  Musculoskeletal: Normal range of motion.  Neurological: He is alert and oriented to person, place, and time.  Skin: Skin is warm and dry.  Psychiatric: He has a normal mood and affect. His behavior is normal.    ED Course  Procedures  DIAGNOSTIC STUDIES:  Oxygen Saturation is 100% on RA, normal by my interpretation.    COORDINATION OF CARE:  7:05 PM Discussed treatment plan with pt at bedside and pt agreed to plan.  Labs Review Labs Reviewed  URINALYSIS, ROUTINE W  REFLEX MICROSCOPIC - Abnormal; Notable for the following:    Hgb urine dipstick LARGE (*)    All other components within normal limits  URINE MICROSCOPIC-ADD ON - Abnormal; Notable for the following:    Bacteria, UA FEW (*)    All other components within normal limits    Imaging Review Ct Abdomen Pelvis Wo Contrast  03/26/2014   CLINICAL DATA:  Left flank pain, patient observed hematuria yesterday  EXAM: CT ABDOMEN AND PELVIS WITHOUT CONTRAST  TECHNIQUE: Multidetector CT imaging of the abdomen and pelvis was performed following the standard protocol without IV contrast.  COMPARISON:  None.  FINDINGS: Multiple foci of sub solid nodular opacity at the  lung bases, all measuring less than or equal to 12 mm. No pleural effusions.  No evidence of nephroureterolithiasis or hydroureteronephrosis. No bladder calculi.  Liver, gallbladder, spleen, pancreas, kidneys, and adrenal glands are normal. There are a few small bilateral retroperitoneal lymph nodes. Aorta is normal. Bowel, appendix, bladder, and reproductive organs are normal.  No acute musculoskeletal findings.  IMPRESSION: No evidence of obstructive nephropathy or nephroureterolithiasis.  Bilateral pulmonary parenchymal sub solid nodular densities. Possibilities include atypical pneumonia, metastasis, etc. CT thorax recommended to fully characterize.   Electronically Signed   By: Esperanza Heir M.D.   On: 03/26/2014 20:35     EKG Interpretation None      MDM   Final diagnoses:  Left flank pain  Hematuria    Patient is in mild to moderate distress.  Although urinalysis shows hemoglobin, CT of abdomen and pelvis shows no stones. Pain addressed. Discharge medications Percocet and Toradol 10 mg  I personally performed the services described in this documentation, which was scribed in my presence. The recorded information has been reviewed and is accurate.    Robert Hutching, MD 03/26/14 2217

## 2014-03-26 NOTE — Discharge Instructions (Signed)
He did have blood in your urine, but no kidney stone was seen on your scan. 2 different medications for pain. Take with food.

## 2014-04-01 ENCOUNTER — Emergency Department (HOSPITAL_COMMUNITY): Payer: Self-pay

## 2014-04-01 ENCOUNTER — Encounter (HOSPITAL_COMMUNITY): Payer: Self-pay | Admitting: Emergency Medicine

## 2014-04-01 ENCOUNTER — Emergency Department (HOSPITAL_COMMUNITY)
Admission: EM | Admit: 2014-04-01 | Discharge: 2014-04-01 | Disposition: A | Discharge status: Home / Self Care | Payer: Self-pay | Attending: Emergency Medicine | Admitting: Emergency Medicine

## 2014-04-01 DIAGNOSIS — R319 Hematuria, unspecified: Secondary | ICD-10-CM | POA: Insufficient documentation

## 2014-04-01 DIAGNOSIS — Z791 Long term (current) use of non-steroidal anti-inflammatories (NSAID): Secondary | ICD-10-CM | POA: Insufficient documentation

## 2014-04-01 DIAGNOSIS — M51379 Other intervertebral disc degeneration, lumbosacral region without mention of lumbar back pain or lower extremity pain: Secondary | ICD-10-CM | POA: Insufficient documentation

## 2014-04-01 DIAGNOSIS — Z8659 Personal history of other mental and behavioral disorders: Secondary | ICD-10-CM | POA: Insufficient documentation

## 2014-04-01 DIAGNOSIS — M545 Low back pain, unspecified: Secondary | ICD-10-CM | POA: Insufficient documentation

## 2014-04-01 DIAGNOSIS — M5137 Other intervertebral disc degeneration, lumbosacral region: Secondary | ICD-10-CM | POA: Insufficient documentation

## 2014-04-01 DIAGNOSIS — F17200 Nicotine dependence, unspecified, uncomplicated: Secondary | ICD-10-CM | POA: Insufficient documentation

## 2014-04-01 DIAGNOSIS — R109 Unspecified abdominal pain: Secondary | ICD-10-CM | POA: Insufficient documentation

## 2014-04-01 HISTORY — DX: Other intervertebral disc degeneration, lumbar region without mention of lumbar back pain or lower extremity pain: M51.369

## 2014-04-01 HISTORY — DX: Other intervertebral disc degeneration, lumbar region: M51.36

## 2014-04-01 HISTORY — DX: Other psychoactive substance abuse, uncomplicated: F19.10

## 2014-04-01 HISTORY — DX: Malingerer (conscious simulation): Z76.5

## 2014-04-01 LAB — I-STAT CHEM 8, ED
BUN: 20 mg/dL (ref 6–23)
CHLORIDE: 104 meq/L (ref 96–112)
Calcium, Ion: 1.21 mmol/L (ref 1.12–1.23)
Creatinine, Ser: 1.1 mg/dL (ref 0.50–1.35)
GLUCOSE: 99 mg/dL (ref 70–99)
HEMATOCRIT: 47 % (ref 39.0–52.0)
Hemoglobin: 16 g/dL (ref 13.0–17.0)
Potassium: 3.7 mEq/L (ref 3.7–5.3)
Sodium: 137 mEq/L (ref 137–147)
TCO2: 27 mmol/L (ref 0–100)

## 2014-04-01 LAB — URINALYSIS, ROUTINE W REFLEX MICROSCOPIC
BILIRUBIN URINE: NEGATIVE
Glucose, UA: NEGATIVE mg/dL
Ketones, ur: NEGATIVE mg/dL
Leukocytes, UA: NEGATIVE
Nitrite: NEGATIVE
Protein, ur: NEGATIVE mg/dL
SPECIFIC GRAVITY, URINE: 1.025 (ref 1.005–1.030)
Urobilinogen, UA: 0.2 mg/dL (ref 0.0–1.0)
pH: 5.5 (ref 5.0–8.0)

## 2014-04-01 LAB — COMPREHENSIVE METABOLIC PANEL
ALK PHOS: 83 U/L (ref 39–117)
ALT: 35 U/L (ref 0–53)
AST: 32 U/L (ref 0–37)
Albumin: 4.1 g/dL (ref 3.5–5.2)
Anion gap: 9 (ref 5–15)
BILIRUBIN TOTAL: 0.8 mg/dL (ref 0.3–1.2)
BUN: 20 mg/dL (ref 6–23)
CHLORIDE: 100 meq/L (ref 96–112)
CO2: 29 mEq/L (ref 19–32)
CREATININE: 0.98 mg/dL (ref 0.50–1.35)
Calcium: 9.7 mg/dL (ref 8.4–10.5)
GFR calc Af Amer: >90 mL/min (ref >90)
Glucose, Bld: 97 mg/dL (ref 70–99)
POTASSIUM: 3.9 meq/L (ref 3.7–5.3)
Sodium: 138 mEq/L (ref 137–147)
Total Protein: 7.8 g/dL (ref 6.0–8.3)

## 2014-04-01 LAB — CBC WITH DIFFERENTIAL/PLATELET
BASOS ABS: 0 10*3/uL (ref 0.0–0.1)
BASOS PCT: 0 % (ref 0–1)
EOS ABS: 0.1 10*3/uL (ref 0.0–0.7)
EOS PCT: 2 % (ref 0–5)
HEMATOCRIT: 41.7 % (ref 39.0–52.0)
Hemoglobin: 14.4 g/dL (ref 13.0–17.0)
LYMPHS PCT: 42 % (ref 12–46)
Lymphs Abs: 2.6 10*3/uL (ref 0.7–4.0)
MCH: 32.1 pg (ref 26.0–34.0)
MCHC: 34.5 g/dL (ref 30.0–36.0)
MCV: 92.9 fL (ref 78.0–100.0)
MONO ABS: 0.6 10*3/uL (ref 0.1–1.0)
Monocytes Relative: 10 % (ref 3–12)
Neutro Abs: 2.9 10*3/uL (ref 1.7–7.7)
Neutrophils Relative %: 46 % (ref 43–77)
PLATELETS: 153 10*3/uL (ref 150–400)
RBC: 4.49 MIL/uL (ref 4.22–5.81)
RDW: 13.1 % (ref 11.5–15.5)
WBC: 6.3 10*3/uL (ref 4.0–10.5)

## 2014-04-01 LAB — URINE MICROSCOPIC-ADD ON

## 2014-04-01 LAB — LIPASE, BLOOD: Lipase: 18 U/L (ref 11–59)

## 2014-04-01 MED ORDER — KETOROLAC TROMETHAMINE 30 MG/ML IJ SOLN
30.0000 mg | Freq: Once | INTRAMUSCULAR | Status: AC
  Administered 2014-04-01: 30 mg via INTRAVENOUS
  Filled 2014-04-01: qty 1

## 2014-04-01 MED ORDER — LIDOCAINE HCL (PF) 1 % IJ SOLN
INTRAMUSCULAR | Status: AC
  Filled 2014-04-01: qty 5

## 2014-04-01 MED ORDER — CYCLOBENZAPRINE HCL 10 MG PO TABS: 10.0000 mg | ORAL_TABLET | Freq: Three times a day (TID) | ORAL | Status: DC | PRN

## 2014-04-01 MED ORDER — AZITHROMYCIN 250 MG PO TABS
1000.0000 mg | ORAL_TABLET | Freq: Once | ORAL | Status: AC
  Administered 2014-04-01: 1000 mg via ORAL
  Filled 2014-04-01 (×2): qty 4

## 2014-04-01 MED ORDER — NAPROXEN 250 MG PO TABS: 250.0000 mg | ORAL_TABLET | Freq: Two times a day (BID) | ORAL | Status: DC | PRN

## 2014-04-01 MED ORDER — CEFTRIAXONE SODIUM 250 MG IJ SOLR
250.0000 mg | Freq: Once | INTRAMUSCULAR | Status: AC
  Administered 2014-04-01: 250 mg via INTRAMUSCULAR
  Filled 2014-04-01: qty 250

## 2014-04-01 NOTE — ED Notes (Signed)
Bladder scan showed 123 mL urine is pts bladder. Pt last used the bathroom at 1530.

## 2014-04-01 NOTE — ED Provider Notes (Signed)
CSN: [id number]     Arrival date & time 04/01/14  1425 History   First MD Initiated Contact with Patient 04/01/14 1520     Chief Complaint  Patient presents with  . Flank Pain  . Nephrolithiasis      HPI Pt was seen at 1535. Per pt, c/o gradual onset and persistence of constant lower back "pain" for the past 10 days. Has been associated with urinary frequency and hematuria. Pt states his pain initially began in his left lower back, now also includes his right lower back. Pt states he "felt like I peed out a rock" last week. Endorses he has hx of "kidney stones." Pt was evaluated in the ED last week for these symptoms and was instructed to f/u with Urologist. Pt has not obtain a f/u appointment yet. Denies fevers, no dysuria, no flank pain, no testicular pain/swelling, no abd pain, no perineal pain. Denies incont/retention of bowel or bladder, no saddle anesthesia, no focal motor weakness, no tingling/numbness in extremities, no injury.     Past Medical History  Diagnosis Date  . Anxiety   . Hallux valgus with bunions   . Bipolar 1 disorder   . Mood swings   . Depression   . DDD (degenerative disc disease), lumbar   . Polysubstance abuse   . Drug-seeking behavior    Past Surgical History  Procedure Laterality Date  . Fatty tumor removed from left foot     Family History  Problem Relation Age of Onset  . Cancer Father    History  Substance Use Topics  . Smoking status: Current Every Day Smoker -- 0.50 packs/day for 5 years    Types: Cigarettes  . Smokeless tobacco: Never Used  . Alcohol Use: Yes    Review of Systems ROS: Statement: All systems negative except as marked or noted in the HPI; Constitutional: Negative for fever and chills. ; ; Eyes: Negative for eye pain, redness and discharge. ; ; ENMT: Negative for ear pain, hoarseness, nasal congestion, sinus pressure and sore throat. ; ; Cardiovascular: Negative for chest pain, palpitations, diaphoresis, dyspnea and  peripheral edema. ; ; Respiratory: Negative for cough, wheezing and stridor. ; ; Gastrointestinal: Negative for nausea, vomiting, diarrhea, abdominal pain, blood in stool, hematemesis, jaundice and rectal bleeding. . ; ; Genitourinary: Negative for dysuria, flank pain. +hematuria, urinary frequency. ; ; Genital:  No penile drainage or rash, no testicular pain or swelling, no scrotal rash or swelling. ;; Musculoskeletal: +LBP. Negative for neck pain. Negative for swelling and trauma.; ; Skin: Negative for pruritus, rash, abrasions, blisters, bruising and skin lesion.; ; Neuro: Negative for headache, lightheadedness and neck stiffness. Negative for weakness, altered level of consciousness , altered mental status, extremity weakness, paresthesias, involuntary movement, seizure and syncope.      Allergies  Tramadol  Home Medications   Prior to Admission medications   Medication Sig Start Date End Date Taking? Authorizing Provider  ibuprofen (ADVIL,MOTRIN) 200 MG tablet Take 400 mg by mouth every 6 (six) hours as needed for mild pain.   Yes Historical Provider, MD  ketorolac (TORADOL) 10 MG tablet Take 10 mg by mouth every 6 (six) hours as needed for moderate pain. 03/26/14  Yes Donnetta Hutching, MD  oxyCODONE-acetaminophen (PERCOCET) 5-325 MG per tablet Take 2 tablets by mouth every 4 (four) hours as needed. 03/26/14  Yes Donnetta Hutching, MD   BP 117/70  Pulse 84  Temp(Src) 97.7 F (36.5 C) (Oral)  Resp 16  Ht  (1.803  m)  Wt 170 lb (77.111 kg)  BMI 23.72 kg/m2  SpO2 100% Physical Exam 1540: Physical examination:  Nursing notes reviewed; Vital signs and O2 SAT reviewed;  Constitutional: Well developed, Well nourished, Well hydrated, In no acute distress; Head:  Normocephalic, atraumatic; Eyes: EOMI, PERRL, No scleral icterus; ENMT: Mouth and pharynx normal, Mucous membranes moist; Neck: Supple, Full range of motion, No lymphadenopathy; Cardiovascular: Regular rate and rhythm, No murmur, rub, or gallop;  Respiratory: Breath sounds clear & equal bilaterally, No rales, rhonchi, wheezes.  Speaking full sentences with ease, Normal respiratory effort/excursion; Chest: Nontender, Movement normal; Abdomen: Soft, Nontender, Nondistended, Normal bowel sounds; Genitourinary: No CVA tenderness; Spine:  No midline CS, TS, LS tenderness. +mild TTP bilat lumbar paraspinal muscles. No rash.;; Extremities: Pulses normal, No tenderness, No edema, No calf edema or asymmetry.; Neuro: AA&Ox3, Major CN grossly intact.  Speech clear. No gross focal motor or sensory deficits in extremities. Climbs on and off stretcher easily by himself. Gait steady.; Skin: Color normal, Warm, Dry.   ED Course  Procedures    MDM  MDM Reviewed: previous chart, nursing note and vitals Reviewed previous: labs and CT scan Interpretation: labs and x-ray    Results for orders placed during the hospital encounter of 04/01/14  URINALYSIS, ROUTINE W REFLEX MICROSCOPIC      Result Value Ref Range   Color, Urine YELLOW  YELLOW   APPearance CLOUDY (*) CLEAR   Specific Gravity, Urine 1.025  1.005 - 1.030   pH 5.5  5.0 - 8.0   Glucose, UA NEGATIVE  NEGATIVE mg/dL   Hgb urine dipstick LARGE (*) NEGATIVE   Bilirubin Urine NEGATIVE  NEGATIVE   Ketones, ur NEGATIVE  NEGATIVE mg/dL   Protein, ur NEGATIVE  NEGATIVE mg/dL   Urobilinogen, UA 0.2  0.0 - 1.0 mg/dL   Nitrite NEGATIVE  NEGATIVE   Leukocytes, UA NEGATIVE  NEGATIVE  COMPREHENSIVE METABOLIC PANEL      Result Value Ref Range   Sodium 138  137 - 147 mEq/L   Potassium 3.9  3.7 - 5.3 mEq/L   Chloride 100  96 - 112 mEq/L   CO2 29  19 - 32 mEq/L   Glucose, Bld 97  70 - 99 mg/dL   BUN 20  6 - 23 mg/dL   Creatinine, Ser 7.82  0.50 - 1.35 mg/dL   Calcium 9.7  8.4 - 95.6 mg/dL   Total Protein 7.8  6.0 - 8.3 g/dL   Albumin 4.1  3.5 - 5.2 g/dL   AST 32  0 - 37 U/L   ALT 35  0 - 53 U/L   Alkaline Phosphatase 83  39 - 117 U/L   Total Bilirubin 0.8  0.3 - 1.2 mg/dL   GFR calc non Af  Amer >90  >90 mL/min   GFR calc Af Amer >90  >90 mL/min   Anion gap 9  5 - 15  CBC WITH DIFFERENTIAL      Result Value Ref Range   WBC 6.3  4.0 - 10.5 K/uL   RBC 4.49  4.22 - 5.81 MIL/uL   Hemoglobin 14.4  13.0 - 17.0 g/dL   HCT 21.3  08.6 - 57.8 %   MCV 92.9  78.0 - 100.0 fL   MCH 32.1  26.0 - 34.0 pg   MCHC 34.5  30.0 - 36.0 g/dL   RDW 46.9  62.9 - 52.8 %   Platelets 153  150 - 400 K/uL   Neutrophils Relative % 46  43 - 77 %   Neutro Abs 2.9  1.7 - 7.7 K/uL   Lymphocytes Relative 42  12 - 46 %   Lymphs Abs 2.6  0.7 - 4.0 K/uL   Monocytes Relative 10  3 - 12 %   Monocytes Absolute 0.6  0.1 - 1.0 K/uL   Eosinophils Relative 2  0 - 5 %   Eosinophils Absolute 0.1  0.0 - 0.7 K/uL   Basophils Relative 0  0 - 1 %   Basophils Absolute 0.0  0.0 - 0.1 K/uL  URINE MICROSCOPIC-ADD ON      Result Value Ref Range   Squamous Epithelial / LPF RARE  RARE   WBC, UA 0-2  <3 WBC/hpf   RBC / HPF 21-50  <3 RBC/hpf   Bacteria, UA FEW (*) RARE  LIPASE, BLOOD      Result Value Ref Range   Lipase 18  11 - 59 U/L  I-STAT CHEM 8, ED      Result Value Ref Range   Sodium 137  137 - 147 mEq/L   Potassium 3.7  3.7 - 5.3 mEq/L   Chloride 104  96 - 112 mEq/L   BUN 20  6 - 23 mg/dL   Creatinine, Ser 1.61  0.50 - 1.35 mg/dL   Glucose, Bld 99  70 - 99 mg/dL   Calcium, Ion 0.96  0.45 - 1.23 mmol/L   TCO2 27  0 - 100 mmol/L   Hemoglobin 16.0  13.0 - 17.0 g/dL   HCT 40.9  81.1 - 91.4 %   Ct Abdomen Pelvis Wo Contrast 03/26/2014   CLINICAL DATA:  Left flank pain, patient observed hematuria yesterday  EXAM: CT ABDOMEN AND PELVIS WITHOUT CONTRAST  TECHNIQUE: Multidetector CT imaging of the abdomen and pelvis was performed following the standard protocol without IV contrast.  COMPARISON:  None.  FINDINGS: Multiple foci of sub solid nodular opacity at the lung bases, all measuring less than or equal to 12 mm. No pleural effusions.  No evidence of nephroureterolithiasis or hydroureteronephrosis. No bladder  calculi.  Liver, gallbladder, spleen, pancreas, kidneys, and adrenal glands are normal. There are a few small bilateral retroperitoneal lymph nodes. Aorta is normal. Bowel, appendix, bladder, and reproductive organs are normal.  No acute musculoskeletal findings.  IMPRESSION: No evidence of obstructive nephropathy or nephroureterolithiasis.  Bilateral pulmonary parenchymal sub solid nodular densities. Possibilities include atypical pneumonia, metastasis, etc. CT thorax recommended to fully characterize.   Electronically Signed   By: Esperanza Heir M.D.   On: 03/26/2014 20:35   Dg Abd Acute W/chest 04/01/2014   CLINICAL DATA:  Bilateral lower back pain radiating around to the anterior abdomen. Pain and burning in the groin when urinating. Microscopic hematuria.  EXAM: ACUTE ABDOMEN SERIES (ABDOMEN 2 VIEW & CHEST 1 VIEW)  COMPARISON:  03/26/2014  FINDINGS: There is no evidence of dilated bowel loops or free intraperitoneal air. There is moderate stool burden. No radiopaque calculi or other significant radiographic abnormality is seen. Heart size and mediastinal contours are within normal limits. Both lungs are clear.  IMPRESSION: Negative abdominal radiographs.  No acute cardiopulmonary disease.  Moderate stool burden.   Electronically Signed   By: Rosalie Gums M.D.   On: 04/01/2014 17:02    1715:   No N/V while in the ED. Has been ambulatory with steady, upright gait. No ureteral calculi on CT scan yesterday. Udip continues with hematuria; UC is pending. Will tx for possible urethritis while in the ED. Will need  Uro MD f/u. Pt states he wants to go home now. Dx and testing d/w pt and family.  Questions answered.  Verb understanding, agreeable to d/c home with outpt f/u.    Samuel Jester, DO 04/04/14 1818

## 2014-04-01 NOTE — ED Notes (Addendum)
Pt returns, here 6 days ago, continues to have blood in urine, pain bilaterally in low back, radiating into groin,  Feels blocked when trying to void, pt has been vomiting this morning

## 2014-04-01 NOTE — Discharge Instructions (Signed)
°Emergency Department Resource Guide °1) Find a Doctor and Pay Out of Pocket °Although you won't have to find out who is covered by your insurance plan, it is a good idea to ask around and get recommendations. You will then need to call the office and see if the doctor you have chosen will accept you as a new patient and what types of options they offer for patients who are self-pay. Some doctors offer discounts or will set up payment plans for their patients who do not have insurance, but you will need to ask so you aren't surprised when you get to your appointment. ° °2) Contact Your Local Health Department °Not all health departments have doctors that can see patients for sick visits, but many do, so it is worth a call to see if yours does. If you don't know where your local health department is, you can check in your phone book. The CDC also has a tool to help you locate your state's health department, and many state websites also have listings of all of their local health departments. ° °3) Find a Walk-in Clinic °If your illness is not likely to be very severe or complicated, you may want to try a walk in clinic. These are popping up all over the country in pharmacies, drugstores, and shopping centers. They're usually staffed by nurse practitioners or physician assistants that have been trained to treat common illnesses and complaints. They're usually fairly quick and inexpensive. However, if you have serious medical issues or chronic medical problems, these are probably not your best option. ° °No Primary Care Doctor: °- Call [facility] at  [phone] - they can help you locate a primary care doctor that  accepts your insurance, provides certain services, etc. °- Physician Referral Service- 1-800-533-3463 ° °Chronic Pain Problems: °Organization         Address  Phone   Notes  °[facility]  ([phone]) [phone] Patients need to be referred by their primary care doctor.  ° °Medication  Assistance: °Organization         Address  Phone   Notes  °[place] [facility] [address] °[place], [place] [address] (336) 641-8030 --Must be a resident of [place] °-- Must have NO insurance coverage whatsoever (no Medicaid/ Medicare, etc.) °-- The pt. MUST have a primary care doctor that directs their care regularly and follows them in the community °  °[facility]  (866) 331-1348   °[employer]  (888) 892-1162   ° °Agencies that provide inexpensive medical care: °Organization         Address  Phone   Notes  °[facility]  (336) 832-8035   °[facility]    (336) 832-7272   °[facility] [address] °[place], [place] [address] (336) 832-4777   °[facility] [address], °[place] (336) 271-4999   °[facility]    (336) 373-0678   °[facility]    (336) 272-1050   °[facility] ° [address], [place] Phone:  (336) 832-4444, Fax:  (336) 832-4440 Hours of Operation:  9 am - 6 pm, M-F.  Also accepts Medicaid/Medicare and self-pay.  °[facility] ° [address], [place] Phone: (336) [phone], Fax: (336) [phone]. Hours of Operation:  8:30 am - 5:30 pm, M-F.  Also accepts Medicaid and self-pay.  °[facility] [place] [address], [place] Phone: (336) 878-6027   °[facility] [address], [place], [place] (336)723-1848, Ext. 123 Mondays & Thursdays: 7-9 AM.  First 15 patients are seen on a first come, first serve basis. °  ° °Medicaid-accepting [place] Providers: ° °Organization         Address  Phone   Notes  °[facility] [address], [place] ([phone]) [phone] Also accepts self-pay patients.  °[facility] [address], [place] ° (336) [phone]   °[facility] [address], [place]  (336) 288-8857   °[facility] [address] [place] [address], [place] (336) 299-7000   °Veita Bland [address], [place]  ° (336) [phone] Only accepts [place] Access Medicaid patients after they have their name applied to their card.  ° °Self-Pay (no insurance) in [place]: ° °Organization         Address  Phone   Notes  °Sickle Cell Patients, [facility] [address], [place] (336) 832-1970   °[facility] [address], [place] (336) 832-4400   °[facility] ° [address], [place] (336) 992-4800   °[facility]/Dr. Osei-Bonsu ° [address] [place] [address], [place] or [address], [place] (336) [phone] Phone number for both [place] and [place] locations is the same.  °[facility] [address], [place] (336) [phone]   °[facility] [address] [place] [address], [place] or [address] (336) 852-7530 °(336) 878-2260   °[facility] [address], [place] (336) 350-1642, phone; (336) 294-5005, fax Sees patients 1st and 3rd Saturday of every month.  Must not qualify for public or private insurance (i.e. Medicaid, Medicare, [place] Health Choice, Veterans' Benefits) • Household income should be no more than 200% of the poverty level •The clinic cannot treat you if you are pregnant or think you are pregnant • Sexually transmitted diseases are not treated at the clinic.  ° ° °Dental Care: °Organization         Address  Phone  Notes  °[place] [facility] [address], [place] (336) 641-6152 Accepts children up to age 21 who are enrolled in Medicaid or [place] Health Choice; pregnant women with a Medicaid card; and children who have applied for Medicaid or [place] Health Choice, but were declined, whose parents can pay a reduced fee at time of service.  °[place]  [facility] [place]  [address], [place] (336) 641-7733 Accepts children up to age 21 who are enrolled in Medicaid or [place] Health Choice; pregnant women with a Medicaid card; and children who have applied for Medicaid or [place] Health Choice, but were declined, whose parents can pay a reduced fee at time of service.  °[facility] ° [address], [place] (336) 641-4533 Patients are seen by appointment only. Walk-ins are not accepted. [facility] will see patients 18 years of age and older. °Monday - Tuesday (8am-5pm) °Most Wednesdays (8:30-5pm) °$30 per visit, cash only  °[facility] ° [address], [place] (336) 641-4533 Patients are seen by appointment only. Walk-ins are not accepted. [facility] will see patients 18 years of age and older. °One   Wednesday Evening (Monthly: Volunteer Based).  $30 per visit, cash only  °[facility]  (919) [phone] for adults; Children under age 4, call Graduate Pediatric Dentistry at ([phone]) [phone]. Children aged 4-14, please call ([phone]) [phone] to request a pediatric application. ° Dental services are provided in all areas of dental care including fillings, crowns and bridges, complete and partial dentures, implants, gum treatment, root canals, and extractions. Preventive care is also provided. Treatment is provided to both adults and children. °Patients are selected via a lottery and there is often a waiting list. °  °[facility] [address], °[place] ° (336) 763-8833 [email or web address] °  °[facility] [address], [place], [place] (336)723-1848, Ext. 123 Second and Fourth Thursday of each month, opens at 6:30 AM; Clinic ends at 9 AM.  Patients are seen on a first-come first-served basis, and a limited number are seen during each clinic.  ° °[facility] ° [address], [place], [place] (336) [phone]    Eligibility Requirements °You must have lived in [place], [place], or [place] counties for at least the last three months. °  You cannot be eligible for state or federal sponsored healthcare insurance, including Veterans Administration, Medicaid, or Medicare. °  You generally cannot be eligible for healthcare insurance through your employer.  °  How to apply: °Eligibility screenings are held every Tuesday and Wednesday afternoon from 1:00 pm until 4:00 pm. You do not need an appointment for the interview!  °[facility] [address], [place], [place] 336-631-2330   °[facility]  336-342-8273   °[facility]  336-703-3100   °[facility]  336-570-6415   ° °Behavioral Health Resources in the Community: °Intensive Outpatient Programs °Organization         Address  Phone  Notes  °[facility] [address], [place], [place] 336-878-6098   °[facility] [address], [place], [place] 336-832-9800   °[facility] [address], [place], [place] ° 336-882-2125   °[facility] [address],  °[place], [place] 1-800-853-5163 or 336-641-4981   °Substance Abuse Resources °Organization         Address  Phone  Notes  °[facility]  336-882-2125   °[facility]  336-784-9470   °[facility]  336-285-9073   °[facility]  336-845-3988   °Residential & Outpatient Substance Abuse Program  1-800-659-3381   °Psychological Services °Organization         Address  Phone  Notes  °[facility]  336- 832-9600   °[facility]  336- 378-7881   °[facility] [address], [place] 1-800-853-5163 or 336-641-4981   ° °Mobile Crisis Teams °Organization         Address  Phone  Notes  °[facility], Mobile Crisis Care Unit  1-877-626-1772   °Assertive °[facility] ° [address]  [place], [place] 336-834-9664   °Sharon DeEsch [address] °[place] 336-554-5454   ° °Self-Help/Support Groups °Organization         Address  Phone             Notes  °[facility]  - variety of support groups  336- 373-1402 Call for more information  °Narcotics Anonymous (NA), [facility] [address], °[place]  2 meetings at this location  ° °  Residential Treatment Programs Organization         Address  Phone  Notes  [facility] [address],    [place]  1-610-960-4540   [facility]  [address] [place] [address], [place], [place] [phone]   [facility] [address] [place], [place] 086-578-4696 Admissions: 8am-3pm M-F  [facility] [address],    [place], [place] 295-284-1324   [facility] [address] [place], [place], [place] 401-027-2536   [facility] [address],  [place], [place] 644-034-7425   [facility] - Intensive Outpatient [address], [place], [place] 956-387-5643   [facility] [address] [place]  [place], [place] 3-295-188-4166 or (718)440-1053   [facility] ([facility] [address], [place], [place] 323-557-3220 Accepts Medicaid  [facility] [address],  [place] 2-542-706-2376 Substance Abuse/Addiction Treatment   [facility]         Address  Phone  Notes  [facility]  765-318-0427   Angie Fava, PhD [address] [place], [place]   623-107-3532 or (410)888-3453   [facility]   [address] [place], [place] 8506641866   [facility] [address], [place], [place] 520-704-0301 Insurance/Medicaid/sponsorship through [facility] [address]                                    [place], [place] [phone] Therapy/tele-psych/case    [facility] [address], [place] [phone]    Dr. Lolly Mustache  [phone]   [facility]  [employer] [facility] Dept. 1) [address], [place] 2) [address] [place] 3)  [address], [place] [phone]  ([phone]) [phone]   [facility] Child Abuse Hotline 3078583475 or 309-109-0403 (After Hours)       Take the prescriptions as directed.  Apply moist heat or ice to the area(s) of discomfort, for 15 minutes at a time, several times per day for the next few days.  Do not fall asleep on a heating or ice pack.  Call your regular medical doctor and the Urologist tomorrow morning to schedule a follow up appointment this week.  Return to the Emergency Department immediately if worsening.

## 2014-04-03 LAB — URINE CULTURE

## 2014-04-20 ENCOUNTER — Emergency Department (HOSPITAL_COMMUNITY): Payer: Self-pay

## 2014-04-20 ENCOUNTER — Encounter (HOSPITAL_COMMUNITY): Payer: Self-pay | Admitting: Emergency Medicine

## 2014-04-20 ENCOUNTER — Emergency Department (HOSPITAL_COMMUNITY)
Admission: EM | Admit: 2014-04-20 | Discharge: 2014-04-20 | Disposition: A | Discharge status: Home / Self Care | Payer: Self-pay | Attending: Emergency Medicine | Admitting: Emergency Medicine

## 2014-04-20 DIAGNOSIS — S01112A Laceration without foreign body of left eyelid and periocular area, initial encounter: Secondary | ICD-10-CM | POA: Insufficient documentation

## 2014-04-20 DIAGNOSIS — T07XXXA Unspecified multiple injuries, initial encounter: Secondary | ICD-10-CM

## 2014-04-20 DIAGNOSIS — S40012A Contusion of left shoulder, initial encounter: Secondary | ICD-10-CM | POA: Insufficient documentation

## 2014-04-20 DIAGNOSIS — S0512XA Contusion of eyeball and orbital tissues, left eye, initial encounter: Secondary | ICD-10-CM | POA: Insufficient documentation

## 2014-04-20 DIAGNOSIS — S90111A Contusion of right great toe without damage to nail, initial encounter: Secondary | ICD-10-CM | POA: Insufficient documentation

## 2014-04-20 DIAGNOSIS — Z791 Long term (current) use of non-steroidal anti-inflammatories (NSAID): Secondary | ICD-10-CM | POA: Insufficient documentation

## 2014-04-20 DIAGNOSIS — S20312A Abrasion of left front wall of thorax, initial encounter: Secondary | ICD-10-CM | POA: Insufficient documentation

## 2014-04-20 DIAGNOSIS — S40812A Abrasion of left upper arm, initial encounter: Secondary | ICD-10-CM | POA: Insufficient documentation

## 2014-04-20 DIAGNOSIS — S60812A Abrasion of left wrist, initial encounter: Secondary | ICD-10-CM | POA: Insufficient documentation

## 2014-04-20 DIAGNOSIS — S60811A Abrasion of right wrist, initial encounter: Secondary | ICD-10-CM | POA: Insufficient documentation

## 2014-04-20 DIAGNOSIS — W15XXXA Fall from cliff, initial encounter: Secondary | ICD-10-CM | POA: Insufficient documentation

## 2014-04-20 DIAGNOSIS — Z8739 Personal history of other diseases of the musculoskeletal system and connective tissue: Secondary | ICD-10-CM | POA: Insufficient documentation

## 2014-04-20 DIAGNOSIS — S40212A Abrasion of left shoulder, initial encounter: Secondary | ICD-10-CM | POA: Insufficient documentation

## 2014-04-20 DIAGNOSIS — Z79899 Other long term (current) drug therapy: Secondary | ICD-10-CM | POA: Insufficient documentation

## 2014-04-20 DIAGNOSIS — Z8659 Personal history of other mental and behavioral disorders: Secondary | ICD-10-CM | POA: Insufficient documentation

## 2014-04-20 DIAGNOSIS — S30811A Abrasion of abdominal wall, initial encounter: Secondary | ICD-10-CM | POA: Insufficient documentation

## 2014-04-20 DIAGNOSIS — Z720 Tobacco use: Secondary | ICD-10-CM | POA: Insufficient documentation

## 2014-04-20 DIAGNOSIS — S300XXA Contusion of lower back and pelvis, initial encounter: Secondary | ICD-10-CM | POA: Insufficient documentation

## 2014-04-20 DIAGNOSIS — W01198A Fall on same level from slipping, tripping and stumbling with subsequent striking against other object, initial encounter: Secondary | ICD-10-CM | POA: Insufficient documentation

## 2014-04-20 DIAGNOSIS — S40811A Abrasion of right upper arm, initial encounter: Secondary | ICD-10-CM | POA: Insufficient documentation

## 2014-04-20 DIAGNOSIS — W19XXXA Unspecified fall, initial encounter: Secondary | ICD-10-CM

## 2014-04-20 DIAGNOSIS — S3991XA Unspecified injury of abdomen, initial encounter: Secondary | ICD-10-CM | POA: Insufficient documentation

## 2014-04-20 LAB — CBC WITH DIFFERENTIAL/PLATELET
BASOS ABS: 0 10*3/uL (ref 0.0–0.1)
Basophils Relative: 0 % (ref 0–1)
EOS PCT: 1 % (ref 0–5)
Eosinophils Absolute: 0.1 10*3/uL (ref 0.0–0.7)
HEMATOCRIT: 43.5 % (ref 39.0–52.0)
Hemoglobin: 14.7 g/dL (ref 13.0–17.0)
LYMPHS PCT: 26 % (ref 12–46)
Lymphs Abs: 1.5 10*3/uL (ref 0.7–4.0)
MCH: 31.5 pg (ref 26.0–34.0)
MCHC: 33.8 g/dL (ref 30.0–36.0)
MCV: 93.3 fL (ref 78.0–100.0)
Monocytes Absolute: 0.4 10*3/uL (ref 0.1–1.0)
Monocytes Relative: 8 % (ref 3–12)
NEUTROS ABS: 3.7 10*3/uL (ref 1.7–7.7)
NEUTROS PCT: 65 % (ref 43–77)
PLATELETS: 148 10*3/uL — AB (ref 150–400)
RBC: 4.66 MIL/uL (ref 4.22–5.81)
RDW: 13.1 % (ref 11.5–15.5)
WBC: 5.7 10*3/uL (ref 4.0–10.5)

## 2014-04-20 LAB — COMPREHENSIVE METABOLIC PANEL
ALBUMIN: 3.5 g/dL (ref 3.5–5.2)
ALT: 31 U/L (ref 0–53)
ANION GAP: 9 (ref 5–15)
AST: 32 U/L (ref 0–37)
Alkaline Phosphatase: 77 U/L (ref 39–117)
BUN: 11 mg/dL (ref 6–23)
CO2: 28 mEq/L (ref 19–32)
CREATININE: 0.96 mg/dL (ref 0.50–1.35)
Calcium: 9.2 mg/dL (ref 8.4–10.5)
Chloride: 100 mEq/L (ref 96–112)
GFR calc Af Amer: >90 mL/min (ref >90)
GFR calc non Af Amer: >90 mL/min (ref >90)
Glucose, Bld: 96 mg/dL (ref 70–99)
Potassium: 4.4 mEq/L (ref 3.7–5.3)
Sodium: 137 mEq/L (ref 137–147)
TOTAL PROTEIN: 6.8 g/dL (ref 6.0–8.3)
Total Bilirubin: 0.7 mg/dL (ref 0.3–1.2)

## 2014-04-20 LAB — URINALYSIS, ROUTINE W REFLEX MICROSCOPIC
Bilirubin Urine: NEGATIVE
Glucose, UA: NEGATIVE mg/dL
Hgb urine dipstick: NEGATIVE
Ketones, ur: NEGATIVE mg/dL
Leukocytes, UA: NEGATIVE
NITRITE: NEGATIVE
Protein, ur: NEGATIVE mg/dL
SPECIFIC GRAVITY, URINE: 1.01 (ref 1.005–1.030)
UROBILINOGEN UA: 1 mg/dL (ref 0.0–1.0)
pH: 8 (ref 5.0–8.0)

## 2014-04-20 LAB — ETHANOL

## 2014-04-20 MED ORDER — OXYCODONE-ACETAMINOPHEN 5-325 MG PO TABS
2.0000 | ORAL_TABLET | Freq: Once | ORAL | Status: AC
  Administered 2014-04-20: 2 via ORAL
  Filled 2014-04-20: qty 2

## 2014-04-20 MED ORDER — OXYCODONE-ACETAMINOPHEN 5-325 MG PO TABS: 2.0000 | ORAL_TABLET | ORAL | Status: DC | PRN

## 2014-04-20 MED ORDER — IBUPROFEN 800 MG PO TABS: 800.0000 mg | ORAL_TABLET | Freq: Three times a day (TID) | ORAL | Status: DC

## 2014-04-20 MED ORDER — IOHEXOL 300 MG/ML  SOLN
100.0000 mL | Freq: Once | INTRAMUSCULAR | Status: AC | PRN
  Administered 2014-04-20: 100 mL via INTRAVENOUS

## 2014-04-20 NOTE — ED Notes (Signed)
Patient given fluids and is walking around in room; getting dressed; states that he would like pain medication to take home with him. MD notified.

## 2014-04-20 NOTE — ED Provider Notes (Signed)
CSN: [id number]     Arrival date & time 04/20/14  1337 History   First MD Initiated Contact with Patient 04/20/14 1717    This chart was scribed for No att. providers found by Marica Otter, ED Scribe. This patient was seen in room APA16A/APA16A and the patient's care was started at 5:27 PM.  Chief Complaint  Patient presents with  . Fall   The history is provided by the patient. No language interpreter was used.   PCP: No PCP Per Patient HPI Comments: Robert Kramer is a 38 y.o. male, with medical Hx noted below, who presents to the Emergency Department complaining of a fall with associated head trauma sustained yesterday around 3:30PM. Pt reports that he [place] 25 ft [place] Pt complains of associated generalized soreness to the entire body (including abd pain, back pain, neck pain, right foot pain, facial pain), nausea (but denies vomiting), multiple lacerations (including above left eye, to the UE and LE) , mild blurred vision. Pt also states that it is painful when he eats. Pt reports that his toe, facial pain, and back are hurting the most. Pt reports he did not come to the ED yesterday because he did not feel like "doing anything but laying down." Pt denies:(1) LOC; (2) any allergies to meds; (3) vomiting; (4) SOB; (5) chronic health problems; (6) daily meds; (7) being on any blood thinners or bleeding easily; (8) frequent alcohol use; (9) drug use. Pt reports his last tetanus shot was 6 months ago.    Past Medical History  Diagnosis Date  . Anxiety   . Hallux valgus with bunions   . Bipolar 1 disorder   . Mood swings   . Depression   . DDD (degenerative disc disease), lumbar   . Polysubstance abuse   . Drug-seeking behavior    Past Surgical History  Procedure Laterality Date  . Fatty tumor removed from left foot     Family History  Problem Relation Age of Onset  . Cancer Father    History  Substance Use Topics  . Smoking status:  Current Every Day Smoker -- 0.50 packs/day for 5 years    Types: Cigarettes  . Smokeless tobacco: Never Used  . Alcohol Use: Yes    Review of Systems A complete 10 system review of systems was obtained and all systems are negative except as noted in the HPI and PMH.     Allergies  Tramadol  Home Medications   Prior to Admission medications   Medication Sig Start Date End Date Taking? Authorizing Provider  acetaminophen (TYLENOL) 500 MG tablet Take 500 mg by mouth every 6 (six) hours as needed for mild pain or moderate pain.   Yes Historical Provider, MD  Aspirin-Acetaminophen-Caffeine (GOODY HEADACHE PO) Take 1 packet by mouth daily as needed (for pain).   Yes Historical Provider, MD  ibuprofen (ADVIL,MOTRIN) 200 MG tablet Take 400 mg by mouth every 6 (six) hours as needed for mild pain.   Yes Historical Provider, MD  ibuprofen (ADVIL,MOTRIN) 800 MG tablet Take 1 tablet (800 mg total) by mouth 3 (three) times daily. 04/20/14   Glynn Octave, MD  oxyCODONE-acetaminophen (PERCOCET/ROXICET) 5-325 MG per tablet Take 2 tablets by mouth every 4 (four) hours as needed for severe pain. 04/20/14   Glynn Octave, MD   Triage Vitals; BP 113/80  Pulse 88  Temp(Src) 97.9 F (36.6 C) (Oral)  Resp 18  Ht 5\' 11"  (1.803  m)  Wt 170 lb (77.111 kg)  BMI 23.72 kg/m2  SpO2 100% Physical Exam  Nursing note and vitals reviewed. Constitutional: He is oriented to person, place, and time. He appears well-developed and well-nourished.  HENT:  Head: Normocephalic.  Mouth/Throat: Oropharynx is clear and moist. No oropharyngeal exudate.  Eyes: Conjunctivae and EOM are normal. Pupils are equal, round, and reactive to light.  periorbital ecchymosis and contusion left orbit.     Neck: Normal range of motion. Neck supple.  Diffuse C spine tenderness  Cardiovascular: Normal rate, regular rhythm, normal heart sounds and intact distal pulses.   No murmur heard. Pulmonary/Chest: Effort normal and breath  sounds normal. No respiratory distress.  Abdominal: Soft. There is tenderness. There is no rebound and no guarding.  Tenderness to upper abd.  Musculoskeletal: He exhibits tenderness. He exhibits no edema.  C-Spine diffusely tender, contusion left posterior shoulder. No T or L spine tenderness. Abrasion and ecchymosis to right, great toe.  Ecchymosis to left sacrum.Tenderness to left shoulder.   Neurological: He is alert and oriented to person, place, and time. No cranial nerve deficit. He exhibits normal muscle tone. Coordination normal.  No ataxia on finger to nose bilaterally. No pronator drift. 5/5 strength throughout. CN 2-12 intact. Negative Romberg. Equal grip strength. Sensation intact. Gait is normal.   Skin: Skin is warm.  2cm vertical laceration through lateral eyebrow on left.  Abrasion right upper arm, right wrist, left posterior shoulder, left medial upper arm, left wrist, right 5th digit over PIP joint, left abd, left ribs.   Psychiatric: He has a normal mood and affect. His behavior is normal.    ED Course  Procedures (including critical care time) DIAGNOSTIC STUDIES: Oxygen Saturation is 100% on RA, nl by my interpretation.    COORDINATION OF CARE: 5:38 PM-Discussed treatment plan which includes imaging, wound care with pt at bedside and pt agreed to plan.  7:29 PM: Recheck: Pt resting. Notified pt of imaging results. Pt reports his worst pain at the moment is left wrist pain, lower back pain and HA.    Labs Review Labs Reviewed  CBC WITH DIFFERENTIAL - Abnormal; Notable for the following:    Platelets 148 (*)    All other components within normal limits  COMPREHENSIVE METABOLIC PANEL  ETHANOL  URINALYSIS, ROUTINE W REFLEX MICROSCOPIC    Imaging Review Dg Wrist Complete Left  04/20/2014   CLINICAL DATA:  Bilateral wrist pain status post fall  EXAM: LEFT WRIST - COMPLETE 3+ VIEW  COMPARISON:  None.  FINDINGS: There is no evidence of fracture or dislocation. There is  no evidence of arthropathy or other focal bone abnormality. Soft tissues are unremarkable.  IMPRESSION: No acute osseous injury of the left wrist.   Electronically Signed   By: Elige Ko   On: 04/20/2014 18:46   Dg Wrist Complete Right  04/20/2014   CLINICAL DATA:  Bilateral wrist pain for 1 day. Status post fall from rock cliff yesterday  EXAM: RIGHT WRIST - COMPLETE 3+ VIEW  COMPARISON:  None.  FINDINGS: Old healed fifth metacarpal bone fracture identified. There is no evidence of acute fracture or dislocation. There is no evidence of arthropathy or other focal bone abnormality. Soft tissues are unremarkable.  IMPRESSION: 1. No acute findings.   Electronically Signed   By: Signa Kell M.D.   On: 04/20/2014 18:46   Ct Head Wo Contrast  04/20/2014   CLINICAL DATA:  Initial evaluation for trauma, patient fell off a cliff 25  feet down onto rod yesterday, hit head but did not lose consciousness interval last night, complaining of left orbital swelling and generalized body pain  EXAM: CT HEAD WITHOUT CONTRAST  CT MAXILLOFACIAL WITHOUT CONTRAST  CT CERVICAL SPINE WITHOUT CONTRAST  TECHNIQUE: Multidetector CT imaging of the head, cervical spine, and maxillofacial structures were performed using the standard protocol without intravenous contrast. Multiplanar CT image reconstructions of the cervical spine and maxillofacial structures were also generated.  COMPARISON:  None.  FINDINGS: CT HEAD FINDINGS  Calvarium is intact. There are several venous lakes in the calvarium and there is a small focus of convex outward hyperostosis posterior right vertex, none of which are likely of acute clinical significance. There is no skull fracture.  There is no evidence of intracranial hemorrhage. There is no evidence of extra-axial fluid. No mass, hydrocephalus, or infarct identified.  CT MAXILLOFACIAL FINDINGS  No air-fluid levels in the sinuses. Orbits are intact bilaterally. No facial bone fractures.  CT CERVICAL SPINE  FINDINGS  No fracture or paraspinous soft tissue swelling. Normal anterior-posterior alignment. Mild degenerative disc disease at C5-6 with mild to moderate degenerative disc disease at C4-5.  IMPRESSION: No acute abnormalities.   Electronically Signed   By: Esperanza Heiraymond  Rubner M.D.   On: 04/20/2014 19:03   Ct Chest W Contrast  04/20/2014   CLINICAL DATA:  38-year-old male post fall. Left chest pain abdominal pain. Generalized body aches.  EXAM: CT CHEST, ABDOMEN AND PELVIS WITHOUT CONTRAST  TECHNIQUE: Multidetector CT imaging of the chest, abdomen and pelvis was performed following the standard protocol without IV contrast.  COMPARISON:  None.  FINDINGS: CT CHEST FINDINGS  No fracture.  No pneumothorax.  No evidence of vascular injury.  No worrisome mass or adenopathy.  CT ABDOMEN AND PELVIS FINDINGS  No visceral injury identified.  No fracture.  No bowel abnormality identified.  No free air or free fluid.  Age advanced mild atherosclerotic type changes lower abdominal aorta are.  No calcified gallstones.  Urinary bladder appears grossly intact.  IMPRESSION: No fracture, vascular or visceral/bowel injury noted. Please see above.   Electronically Signed   By: Bridgett LarssonSteve  Olson M.D.   On: 04/20/2014 19:17   Ct Cervical Spine Wo Contrast  04/20/2014   CLINICAL DATA:  Initial evaluation for trauma, patient fell off a cliff 25 feet down onto rod yesterday, hit head but did not lose consciousness interval last night, complaining of left orbital swelling and generalized body pain  EXAM: CT HEAD WITHOUT CONTRAST  CT MAXILLOFACIAL WITHOUT CONTRAST  CT CERVICAL SPINE WITHOUT CONTRAST  TECHNIQUE: Multidetector CT imaging of the head, cervical spine, and maxillofacial structures were performed using the standard protocol without intravenous contrast. Multiplanar CT image reconstructions of the cervical spine and maxillofacial structures were also generated.  COMPARISON:  None.  FINDINGS: CT HEAD FINDINGS  Calvarium is intact.  There are several venous lakes in the calvarium and there is a small focus of convex outward hyperostosis posterior right vertex, none of which are likely of acute clinical significance. There is no skull fracture.  There is no evidence of intracranial hemorrhage. There is no evidence of extra-axial fluid. No mass, hydrocephalus, or infarct identified.  CT MAXILLOFACIAL FINDINGS  No air-fluid levels in the sinuses. Orbits are intact bilaterally. No facial bone fractures.  CT CERVICAL SPINE FINDINGS  No fracture or paraspinous soft tissue swelling. Normal anterior-posterior alignment. Mild degenerative disc disease at C5-6 with mild to moderate degenerative disc disease at C4-5.  IMPRESSION: No acute abnormalities.  Electronically Signed   By: Esperanza Heiraymond  Rubner M.D.   On: 04/20/2014 19:03   Ct Abdomen Pelvis W Contrast  04/20/2014   CLINICAL DATA:  38-year-old male post fall. Left chest pain abdominal pain. Generalized body aches.  EXAM: CT CHEST, ABDOMEN AND PELVIS WITHOUT CONTRAST  TECHNIQUE: Multidetector CT imaging of the chest, abdomen and pelvis was performed following the standard protocol without IV contrast.  COMPARISON:  None.  FINDINGS: CT CHEST FINDINGS  No fracture.  No pneumothorax.  No evidence of vascular injury.  No worrisome mass or adenopathy.  CT ABDOMEN AND PELVIS FINDINGS  No visceral injury identified.  No fracture.  No bowel abnormality identified.  No free air or free fluid.  Age advanced mild atherosclerotic type changes lower abdominal aorta are.  No calcified gallstones.  Urinary bladder appears grossly intact.  IMPRESSION: No fracture, vascular or visceral/bowel injury noted. Please see above.   Electronically Signed   By: Bridgett LarssonSteve  Olson M.D.   On: 04/20/2014 19:17   Dg Pelvis Portable  04/20/2014   CLINICAL DATA:  38 year old male post fall with pain tail bone region. Initial encounter.  EXAM: PORTABLE PELVIS 1-2 VIEWS  COMPARISON:  11/27/2012 plain film exam.  FINDINGS: Taking into  account overlying bowel, no pelvic fracture detected.  IMPRESSION: Taking into account overlying bowel, no pelvic fracture detected.   Electronically Signed   By: Bridgett LarssonSteve  Olson M.D.   On: 04/20/2014 18:49   Dg Chest Port 1 View  04/20/2014   CLINICAL DATA:  Right-sided flank pain, bilateral shoulder pain  EXAM: PORTABLE CHEST - 1 VIEW  COMPARISON:  04/01/2014  FINDINGS: The heart size and mediastinal contours are within normal limits. Both lungs are clear. The visualized skeletal structures are unremarkable.  IMPRESSION: No active disease.   Electronically Signed   By: Elige KoHetal  Patel   On: 04/20/2014 18:35   Dg Shoulder Left  04/20/2014   CLINICAL DATA:  Left shoulder pain for 1 day after a fall. Initial encounter.  EXAM: LEFT SHOULDER - 2+ VIEW  COMPARISON:  Plain films left shoulder 12/25/2010.  FINDINGS: The humerus is located and there is no fracture. Mild widening of the acromioclavicular joint is unchanged and may be due to remote [facility] joint separation. Imaged left lung and ribs are clear.  IMPRESSION: No acute finding.  Stable compared to prior exam.   Electronically Signed   By: Drusilla Kannerhomas  Dalessio M.D.   On: 04/20/2014 18:46   Dg Toe Great Right  04/20/2014   CLINICAL DATA:  great toe pain. Status post fall from rock with yesterday while fishing.  EXAM: RIGHT GREAT TOE  COMPARISON:  None.  FINDINGS: Hallux valgus deformity identified. There is moderate degenerative joint disease noted involving the first MTP joint. No fractures or dislocations identified.  IMPRESSION: 1. No acute findings. 2. Hallux valgus deformity.   Electronically Signed   By: Signa Kellaylor  Stroud M.D.   On: 04/20/2014 18:44   Ct Maxillofacial Wo Cm  04/20/2014   CLINICAL DATA:  Initial evaluation for trauma, patient fell off a cliff 25 feet down onto rod yesterday, hit head but did not lose consciousness interval last night, complaining of left orbital swelling and generalized body pain  EXAM: CT HEAD WITHOUT CONTRAST  CT  MAXILLOFACIAL WITHOUT CONTRAST  CT CERVICAL SPINE WITHOUT CONTRAST  TECHNIQUE: Multidetector CT imaging of the head, cervical spine, and maxillofacial structures were performed using the standard protocol without intravenous contrast. Multiplanar CT image reconstructions of the cervical spine and maxillofacial structures  were also generated.  COMPARISON:  None.  FINDINGS: CT HEAD FINDINGS  Calvarium is intact. There are several venous lakes in the calvarium and there is a small focus of convex outward hyperostosis posterior right vertex, none of which are likely of acute clinical significance. There is no skull fracture.  There is no evidence of intracranial hemorrhage. There is no evidence of extra-axial fluid. No mass, hydrocephalus, or infarct identified.  CT MAXILLOFACIAL FINDINGS  No air-fluid levels in the sinuses. Orbits are intact bilaterally. No facial bone fractures.  CT CERVICAL SPINE FINDINGS  No fracture or paraspinous soft tissue swelling. Normal anterior-posterior alignment. Mild degenerative disc disease at C5-6 with mild to moderate degenerative disc disease at C4-5.  IMPRESSION: No acute abnormalities.   Electronically Signed   By: Esperanza Heir M.D.   On: 04/20/2014 19:03     EKG Interpretation None      MDM   Final diagnoses:  Fall, initial encounter  Multiple contusions   Patient reports fall from "25 foot cliff" yesterday. States he tumbled down a rock. Denies losing consciousness. Complains of pain and swelling to left orbit, bilateral wrists, left chest and abdomen. Right great toe.  Vital stable. GCS 15. ABCs intact  Chest x-ray negative. Pelvis x-ray negative. CT head, C-spine and maxillofacial negative.  Laceration to eyebrow cleaned. Sutures unable to be placed given that injury is 27 hours old. Tetanus is up-to-date.  Imaging negative for acute traumatic injury. No injury to chest or pelvis. Extremity films negative.  Patient able to ambulate. He is tolerating  by mouth. Follow up with PCP. Return precautions discussed.  BP 110/80  Pulse 74  Temp(Src) 97.9 F (36.6 C) (Oral)  Resp 18  Ht 5\' 11"  (1.803 m)  Wt 170 lb (77.111 kg)  BMI 23.72 kg/m2  SpO2 100%    I personally performed the services described in this documentation, which was scribed in my presence. The recorded information has been reviewed and is accurate.   Glynn Octave, MD 04/21/14 [phone]

## 2014-04-20 NOTE — Discharge Instructions (Signed)
Contusion Your testing is negative for serious injury. Follow up with your doctor. Return to the ED if you develop new or worsening symptoms. A contusion is a deep bruise. Contusions are the result of an injury that caused bleeding under the skin. The contusion may turn blue, purple, or yellow. Minor injuries will give you a painless contusion, but more severe contusions may stay painful and swollen for a few weeks.  CAUSES  A contusion is usually caused by a blow, trauma, or direct force to an area of the body. SYMPTOMS   Swelling and redness of the injured area.  Bruising of the injured area.  Tenderness and soreness of the injured area.  Pain. DIAGNOSIS  The diagnosis can be made by taking a history and physical exam. An X-ray, CT scan, or MRI may be needed to determine if there were any associated injuries, such as fractures. TREATMENT  Specific treatment will depend on what area of the body was injured. In general, the best treatment for a contusion is resting, icing, elevating, and applying cold compresses to the injured area. Over-the-counter medicines may also be recommended for pain control. Ask your caregiver what the best treatment is for your contusion. HOME CARE INSTRUCTIONS   Put ice on the injured area.  Put ice in a plastic bag.  Place a towel between your skin and the bag.  Leave the ice on for 15-20 minutes, 3-4 times a day, or as directed by your health care provider.  Only take over-the-counter or prescription medicines for pain, discomfort, or fever as directed by your caregiver. Your caregiver may recommend avoiding anti-inflammatory medicines (aspirin, ibuprofen, and naproxen) for 48 hours because these medicines may increase bruising.  Rest the injured area.  If possible, elevate the injured area to reduce swelling. SEEK IMMEDIATE MEDICAL CARE IF:   You have increased bruising or swelling.  You have pain that is getting worse.  Your swelling or pain is  not relieved with medicines. MAKE SURE YOU:   Understand these instructions.  Will watch your condition.  Will get help right away if you are not doing well or get worse. Document Released: 04/01/2005 Document Revised: 06/27/2013 Document Reviewed: 04/27/2011 [facility] Patient Information 2015 [place], [place]. This information is not intended to replace advice given to you by your health care provider. Make sure you discuss any questions you have with your health care provider.

## 2014-04-20 NOTE — ED Notes (Signed)
Patient verbalizes that he is upset, wants to leave, that he has been here for hours. States that everything closes at 9 p.m. And that he will not be able to get a prescription filled after that, that the MD needs to know that he will be needing something to take home for pain. MD notified.
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
## 2014-09-27 ENCOUNTER — Emergency Department (HOSPITAL_COMMUNITY): Payer: Self-pay

## 2014-09-27 ENCOUNTER — Encounter (HOSPITAL_COMMUNITY): Payer: Self-pay | Admitting: Emergency Medicine

## 2014-09-27 ENCOUNTER — Emergency Department (HOSPITAL_COMMUNITY)
Admission: EM | Admit: 2014-09-27 | Discharge: 2014-09-27 | Disposition: A | Discharge status: Home / Self Care | Payer: Self-pay | Attending: Emergency Medicine | Admitting: Emergency Medicine

## 2014-09-27 DIAGNOSIS — Z720 Tobacco use: Secondary | ICD-10-CM | POA: Insufficient documentation

## 2014-09-27 DIAGNOSIS — Y998 Other external cause status: Secondary | ICD-10-CM | POA: Insufficient documentation

## 2014-09-27 DIAGNOSIS — S79911A Unspecified injury of right hip, initial encounter: Secondary | ICD-10-CM | POA: Insufficient documentation

## 2014-09-27 DIAGNOSIS — R51 Headache: Secondary | ICD-10-CM | POA: Insufficient documentation

## 2014-09-27 DIAGNOSIS — Z8739 Personal history of other diseases of the musculoskeletal system and connective tissue: Secondary | ICD-10-CM | POA: Insufficient documentation

## 2014-09-27 DIAGNOSIS — S161XXA Strain of muscle, fascia and tendon at neck level, initial encounter: Secondary | ICD-10-CM

## 2014-09-27 DIAGNOSIS — Z8659 Personal history of other mental and behavioral disorders: Secondary | ICD-10-CM | POA: Insufficient documentation

## 2014-09-27 DIAGNOSIS — Y92410 Unspecified street and highway as the place of occurrence of the external cause: Secondary | ICD-10-CM | POA: Insufficient documentation

## 2014-09-27 DIAGNOSIS — Y9389 Activity, other specified: Secondary | ICD-10-CM | POA: Insufficient documentation

## 2014-09-27 LAB — ETHANOL: ALCOHOL ETHYL (B): 56 mg/dL — AB (ref 0–9)

## 2014-09-27 MED ORDER — IBUPROFEN 600 MG PO TABS: 600.0000 mg | ORAL_TABLET | Freq: Four times a day (QID) | ORAL | Status: DC | PRN

## 2014-09-27 MED ORDER — HYDROCODONE-ACETAMINOPHEN 5-325 MG PO TABS: 1.0000 | ORAL_TABLET | Freq: Four times a day (QID) | ORAL | Status: DC | PRN

## 2014-09-27 MED ORDER — HYDROCODONE-ACETAMINOPHEN 5-325 MG PO TABS
1.0000 | ORAL_TABLET | Freq: Once | ORAL | Status: AC
  Administered 2014-09-27: 1 via ORAL
  Filled 2014-09-27: qty 1

## 2014-09-27 NOTE — ED Notes (Signed)
Pt. Ambulated in hallway without difficulty. Pt. Drinking water.
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
## 2014-09-27 NOTE — Discharge Instructions (Signed)
Cervical Sprain °A cervical sprain is an injury in the neck in which the strong, fibrous tissues (ligaments) that connect your neck bones stretch or tear. Cervical sprains can range from mild to severe. Severe cervical sprains can cause the neck vertebrae to be unstable. This can lead to damage of the spinal cord and can result in serious nervous system problems. The amount of time it takes for a cervical sprain to get better depends on the cause and extent of the injury. Most cervical sprains heal in 1 to 3 weeks. °CAUSES  °Severe cervical sprains may be caused by:  °· Contact sport injuries (such as from football, rugby, wrestling, hockey, auto racing, gymnastics, diving, martial arts, or boxing).   °· Motor vehicle collisions.   °· Whiplash injuries. This is an injury from a sudden forward and backward whipping movement of the head and neck.  °· Falls.   °Mild cervical sprains may be caused by:  °· Being in an awkward position, such as while cradling a telephone between your ear and shoulder.   °· Sitting in a chair that does not offer proper support.   °· Working at a poorly designed computer station.   °· Looking up or down for long periods of time.   °SYMPTOMS  °· Pain, soreness, stiffness, or a burning sensation in the front, back, or sides of the neck. This discomfort may develop immediately after the injury or slowly, 24 hours or more after the injury.   °· Pain or tenderness directly in the middle of the back of the neck.   °· Shoulder or upper back pain.   °· Limited ability to move the neck.   °· Headache.   °· Dizziness.   °· Weakness, numbness, or tingling in the hands or arms.   °· Muscle spasms.   °· Difficulty swallowing or chewing.   °· Tenderness and swelling of the neck.   °DIAGNOSIS  °Most of the time your health care provider can diagnose a cervical sprain by taking your history and doing a physical exam. Your health care provider will ask about previous neck injuries and any known neck  problems, such as arthritis in the neck. X-rays may be taken to find out if there are any other problems, such as with the bones of the neck. Other tests, such as a CT scan or MRI, may also be needed.  °TREATMENT  °Treatment depends on the severity of the cervical sprain. Mild sprains can be treated with rest, keeping the neck in place (immobilization), and pain medicines. Severe cervical sprains are immediately immobilized. Further treatment is done to help with pain, muscle spasms, and other symptoms and may include: °· Medicines, such as pain relievers, numbing medicines, or muscle relaxants.   °· Physical therapy. This may involve stretching exercises, strengthening exercises, and posture training. Exercises and improved posture can help stabilize the neck, strengthen muscles, and help stop symptoms from returning.   °HOME CARE INSTRUCTIONS  °· Put ice on the injured area.   °¨ Put ice in a plastic bag.   °¨ Place a towel between your skin and the bag.   °¨ Leave the ice on for 15-20 minutes, 3-4 times a day.   °· If your injury was severe, you may have been given a cervical collar to wear. A cervical collar is a two-piece collar designed to keep your neck from moving while it heals. °¨ Do not remove the collar unless instructed by your health care provider. °¨ If you have long hair, keep it outside of the collar. °¨ Ask your health care provider before making any adjustments to your collar. Minor   adjustments may be required over time to improve comfort and reduce pressure on your chin or on the back of your head. °¨ If you are allowed to remove the collar for cleaning or bathing, follow your health care provider's instructions on how to do so safely. °¨ Keep your collar clean by wiping it with mild soap and water and drying it completely. If the collar you have been given includes removable pads, remove them every 1-2 days and hand wash them with soap and water. Allow them to air dry. They should be completely  dry before you wear them in the collar. °¨ If you are allowed to remove the collar for cleaning and bathing, wash and dry the skin of your neck. Check your skin for irritation or sores. If you see any, tell your health care provider. °¨ Do not drive while wearing the collar.   °· Only take over-the-counter or prescription medicines for pain, discomfort, or fever as directed by your health care provider.   °· Keep all follow-up appointments as directed by your health care provider.   °· Keep all physical therapy appointments as directed by your health care provider.   °· Make any needed adjustments to your workstation to promote good posture.   °· Avoid positions and activities that make your symptoms worse.   °· Warm up and stretch before being active to help prevent problems.   °SEEK MEDICAL CARE IF:  °· Your pain is not controlled with medicine.   °· You are unable to decrease your pain medicine over time as planned.   °· Your activity level is not improving as expected.   °SEEK IMMEDIATE MEDICAL CARE IF:  °· You develop any bleeding. °· You develop stomach upset. °· You have signs of an allergic reaction to your medicine.   °· Your symptoms get worse.   °· You develop new, unexplained symptoms.   °· You have numbness, tingling, weakness, or paralysis in any part of your body.   °MAKE SURE YOU:  °· Understand these instructions. °· Will watch your condition. °· Will get help right away if you are not doing well or get worse. °Document Released: 04/19/2007 Document Revised: 06/27/2013 Document Reviewed: 12/28/2012 °[employer]® Patient Information ©2015 [employer]. This information is not intended to replace advice given to you by your health care provider. Make sure you discuss any questions you have with your health care provider. ° °Motor Vehicle Collision °It is common to have multiple bruises and sore muscles after a motor vehicle collision (MVC). These tend to feel worse for the first 24 hours. You may have  the most stiffness and soreness over the first several hours. You may also feel worse when you wake up the first morning after your collision. After this point, you will usually begin to improve with each day. The speed of improvement often depends on the severity of the collision, the number of injuries, and the location and nature of these injuries. °HOME CARE INSTRUCTIONS °· Put ice on the injured area. °¨ Put ice in a plastic bag. °¨ Place a towel between your skin and the bag. °¨ Leave the ice on for 15-20 minutes, 3-4 times a day, or as directed by your health care provider. °· Drink enough fluids to keep your urine clear or pale yellow. Do not drink alcohol. °· Take a warm shower or bath once or twice a day. This will increase blood flow to sore muscles. °· You may return to activities as directed by your caregiver. Be careful when lifting, as this may aggravate neck or back   pain. °· Only take over-the-counter or prescription medicines for pain, discomfort, or fever as directed by your caregiver. Do not use aspirin. This may increase bruising and bleeding. °SEEK IMMEDIATE MEDICAL CARE IF: °· You have numbness, tingling, or weakness in the arms or legs. °· You develop severe headaches not relieved with medicine. °· You have severe neck pain, especially tenderness in the middle of the back of your neck. °· You have changes in bowel or bladder control. °· There is increasing pain in any area of the body. °· You have shortness of breath, light-headedness, dizziness, or fainting. °· You have chest pain. °· You feel sick to your stomach (nauseous), throw up (vomit), or sweat. °· You have increasing abdominal discomfort. °· There is blood in your urine, stool, or vomit. °· You have pain in your shoulder (shoulder strap areas). °· You feel your symptoms are getting worse. °MAKE SURE YOU: °· Understand these instructions. °· Will watch your condition. °· Will get help right away if you are not doing well or get  worse. °Document Released: 06/22/2005 Document Revised: 11/06/2013 Document Reviewed: 11/19/2010 °[employer]® Patient Information ©2015 [employer]. This information is not intended to replace advice given to you by your health care provider. Make sure you discuss any questions you have with your health care provider. ° °
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
## 2014-10-12 IMAGING — CT CT ABD-PELV W/O CM
3 of 4 series · 8 of 46 positions shown, 15 images · non-contrast
Comparison: None.

CLINICAL DATA: Left flank pain, patient observed hematuria
yesterday

EXAM:
CT ABDOMEN AND PELVIS WITHOUT CONTRAST
TECHNIQUE: Multidetector CT imaging of the abdomen and pelvis was performed
following the standard protocol without IV contrast.

[Series 3: lung 5.0 b60f · axial · 0.66mm/px · z∈[-101,-41]mm · 4 of 22 slices shown, 9 images]
[im 5/22  soft-tissue]
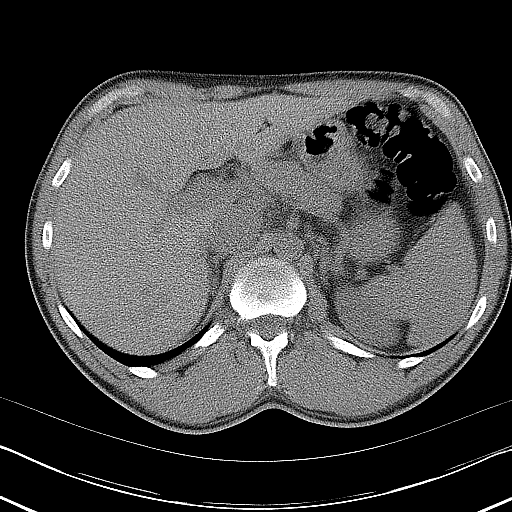
[im 5/22  lung]
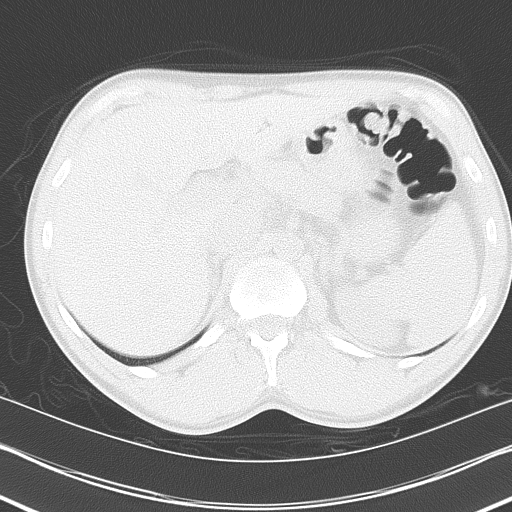
[im 5/22  bone]
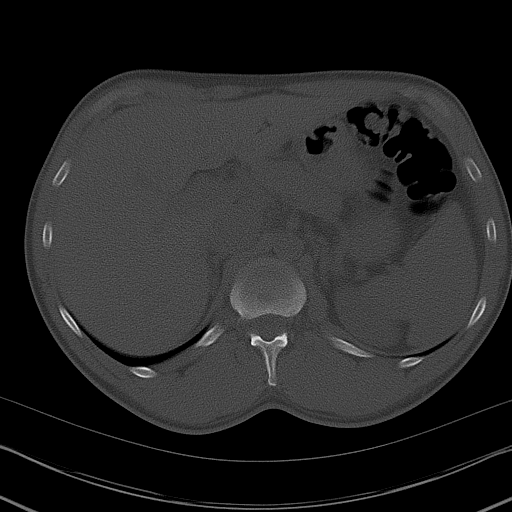
[im 9/22  soft-tissue]
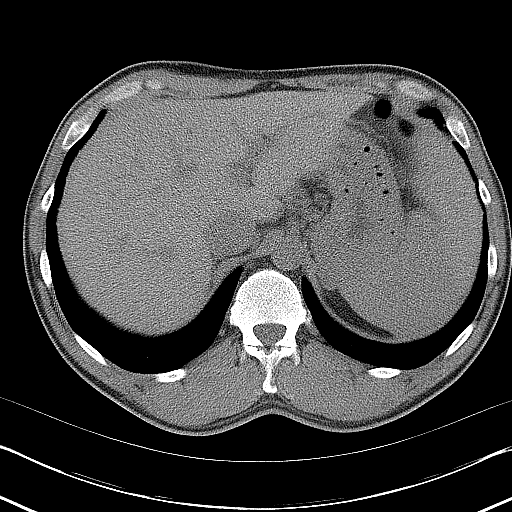
[im 9/22  lung]
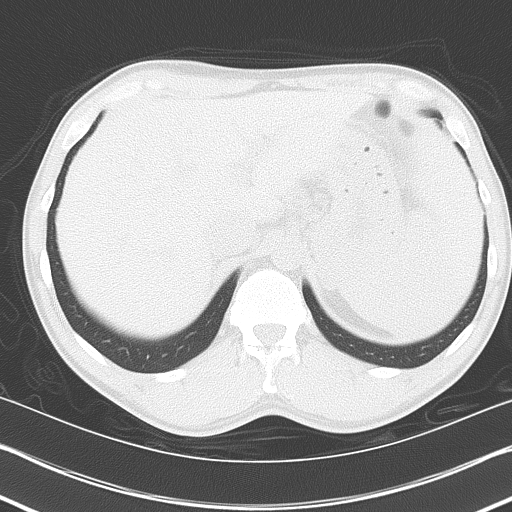
[im 13/22  soft-tissue]
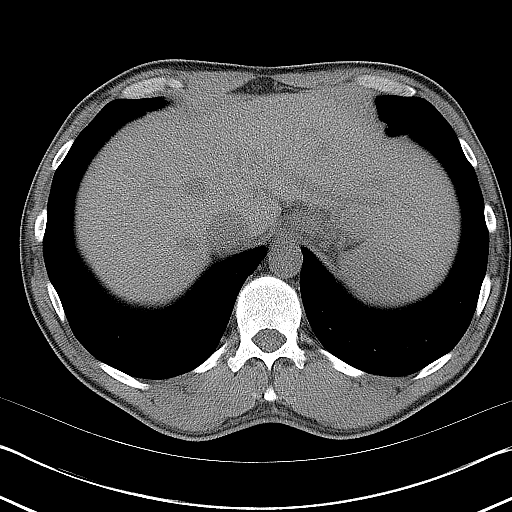
[im 13/22  lung]
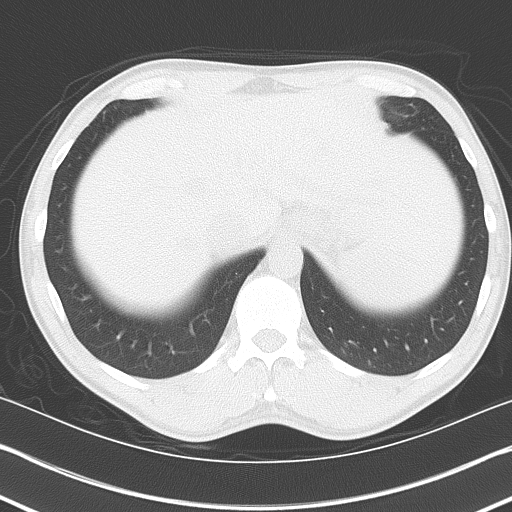
[im 17/22  soft-tissue]
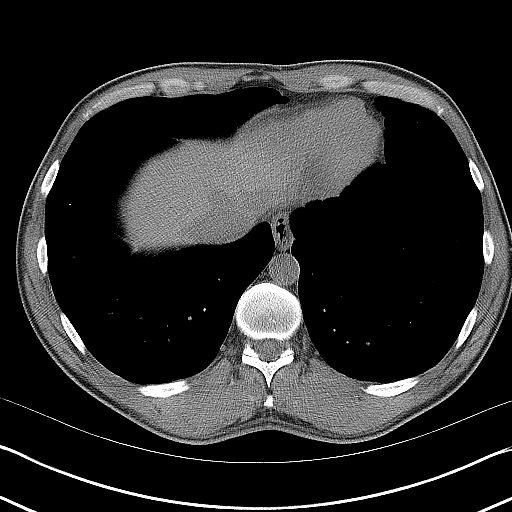
[im 17/22  lung]
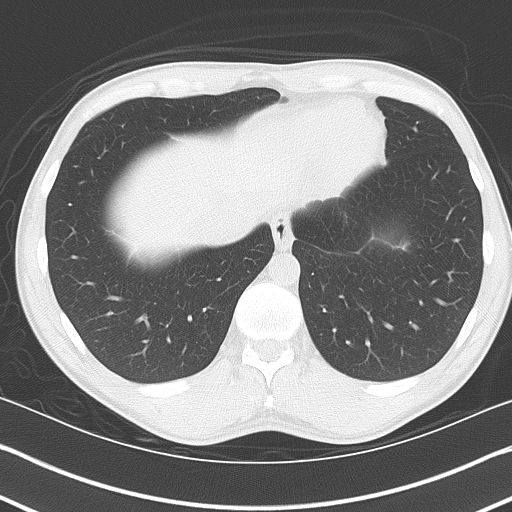

[Series 4: mpr coronal (id) · coronal · 0.65mm/px · 3 of 83 slices shown, 4 images]
[im 28/83  soft-tissue]
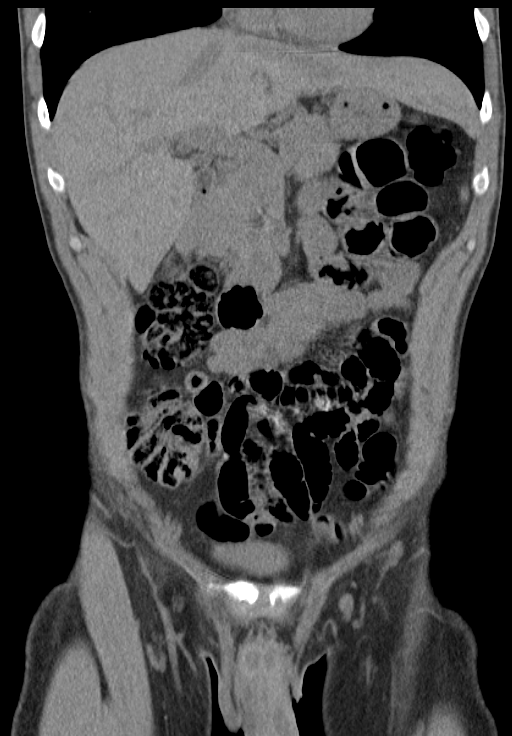
[im 37/83  soft-tissue]
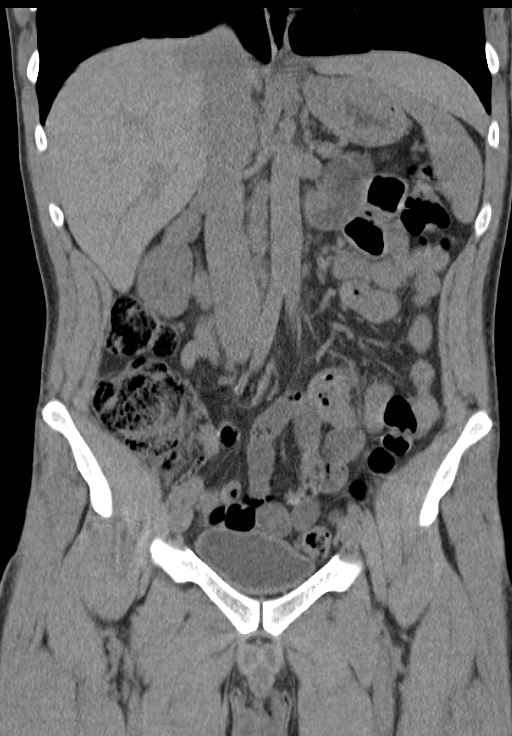
[im 37/83  bone]
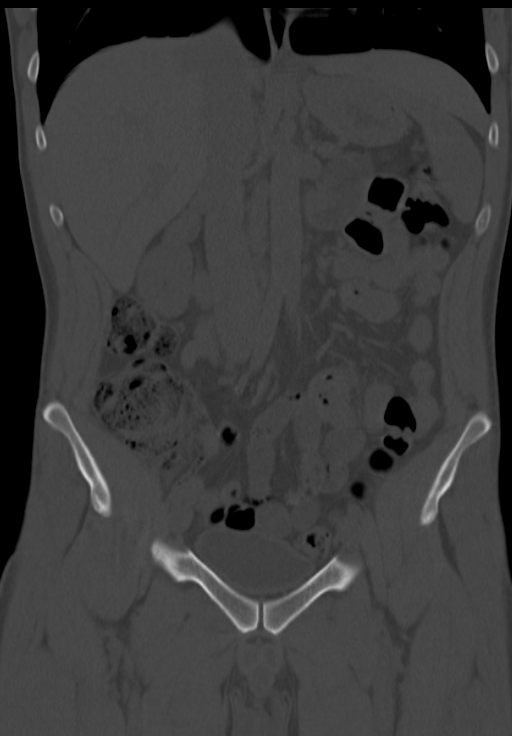
[im 46/83  soft-tissue]
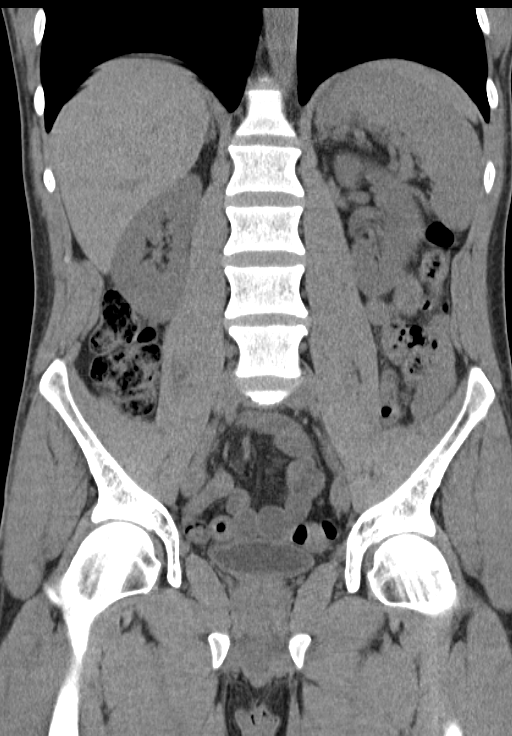

[Series 5: mpr sagittal (id) · sagittal · 0.51mm/px · 1 of 109 slices shown, 2 images]
[im 37/109  soft-tissue]
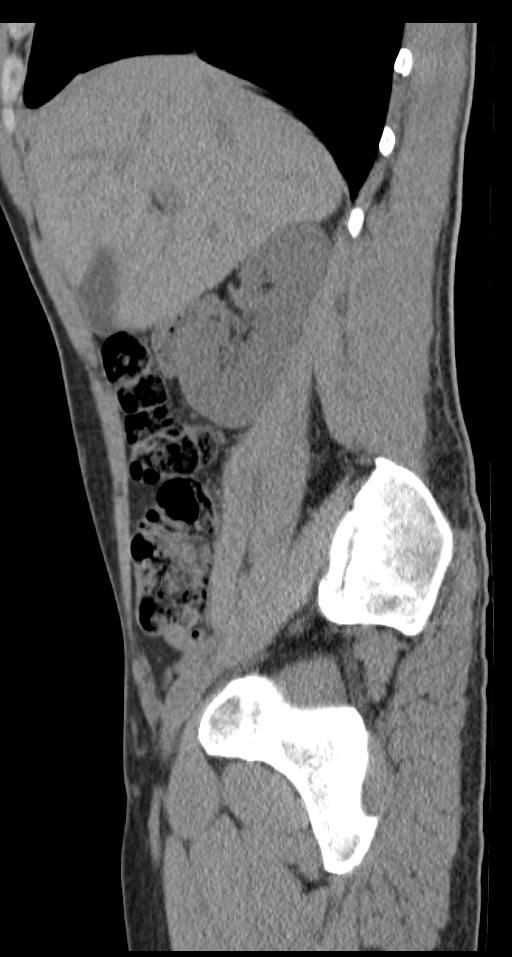
[im 37/109  bone]
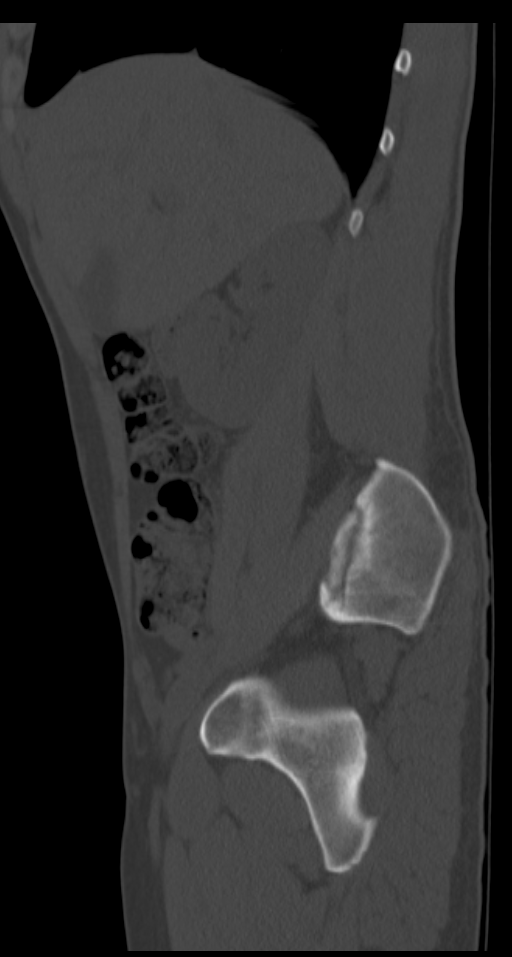

[8 of 46 positions shown; findings below may reference images not displayed]

FINDINGS: Multiple foci of sub solid nodular opacity at the lung bases, all
measuring less than or equal to 12 mm. No pleural effusions.

No evidence of nephroureterolithiasis or hydroureteronephrosis. No
bladder calculi.

Liver, gallbladder, spleen, pancreas, kidneys, and adrenal glands
are normal. There are a few small bilateral retroperitoneal lymph
nodes. Aorta is normal. Bowel, appendix, bladder, and reproductive
organs are normal.

No acute musculoskeletal findings.
IMPRESSION: No evidence of obstructive nephropathy or nephroureterolithiasis.

Bilateral pulmonary parenchymal sub solid nodular densities.
Possibilities include atypical pneumonia, metastasis, etc. CT thorax
recommended to fully characterize.

## 2015-01-10 ENCOUNTER — Emergency Department (HOSPITAL_COMMUNITY)
Admission: EM | Admit: 2015-01-10 | Discharge: 2015-01-10 | Disposition: A | Discharge status: Home / Self Care | Payer: Self-pay | Attending: Emergency Medicine | Admitting: Emergency Medicine

## 2015-01-10 ENCOUNTER — Emergency Department (HOSPITAL_COMMUNITY): Payer: Self-pay

## 2015-01-10 ENCOUNTER — Encounter (HOSPITAL_COMMUNITY): Payer: Self-pay | Admitting: Emergency Medicine

## 2015-01-10 DIAGNOSIS — Z720 Tobacco use: Secondary | ICD-10-CM | POA: Insufficient documentation

## 2015-01-10 DIAGNOSIS — S3992XA Unspecified injury of lower back, initial encounter: Secondary | ICD-10-CM | POA: Insufficient documentation

## 2015-01-10 DIAGNOSIS — S0031XA Abrasion of nose, initial encounter: Secondary | ICD-10-CM | POA: Insufficient documentation

## 2015-01-10 DIAGNOSIS — Z8659 Personal history of other mental and behavioral disorders: Secondary | ICD-10-CM | POA: Insufficient documentation

## 2015-01-10 DIAGNOSIS — S24109A Unspecified injury at unspecified level of thoracic spinal cord, initial encounter: Secondary | ICD-10-CM | POA: Insufficient documentation

## 2015-01-10 DIAGNOSIS — S20212A Contusion of left front wall of thorax, initial encounter: Secondary | ICD-10-CM | POA: Insufficient documentation

## 2015-01-10 DIAGNOSIS — S6991XA Unspecified injury of right wrist, hand and finger(s), initial encounter: Secondary | ICD-10-CM | POA: Insufficient documentation

## 2015-01-10 DIAGNOSIS — Y998 Other external cause status: Secondary | ICD-10-CM | POA: Insufficient documentation

## 2015-01-10 DIAGNOSIS — Y9355 Activity, bike riding: Secondary | ICD-10-CM | POA: Insufficient documentation

## 2015-01-10 DIAGNOSIS — S129XXA Fracture of neck, unspecified, initial encounter: Secondary | ICD-10-CM | POA: Insufficient documentation

## 2015-01-10 DIAGNOSIS — Y92007 Garden or yard of unspecified non-institutional (private) residence as the place of occurrence of the external cause: Secondary | ICD-10-CM | POA: Insufficient documentation

## 2015-01-10 DIAGNOSIS — Z8739 Personal history of other diseases of the musculoskeletal system and connective tissue: Secondary | ICD-10-CM | POA: Insufficient documentation

## 2015-01-10 LAB — URINE MICROSCOPIC-ADD ON

## 2015-01-10 LAB — URINALYSIS, ROUTINE W REFLEX MICROSCOPIC
GLUCOSE, UA: NEGATIVE mg/dL
HGB URINE DIPSTICK: NEGATIVE
Ketones, ur: NEGATIVE mg/dL
Leukocytes, UA: NEGATIVE
Nitrite: NEGATIVE
PH: 5.5 (ref 5.0–8.0)
Specific Gravity, Urine: >1.03 — ABNORMAL HIGH (ref 1.005–1.030)
UROBILINOGEN UA: 2 mg/dL — AB (ref 0.0–1.0)

## 2015-01-10 MED ORDER — OXYCODONE-ACETAMINOPHEN 5-325 MG PO TABS: 1.0000 | ORAL_TABLET | ORAL | Status: DC | PRN

## 2015-01-10 MED ORDER — HYDROCODONE-ACETAMINOPHEN 5-325 MG PO TABS
2.0000 | ORAL_TABLET | Freq: Once | ORAL | Status: AC
  Administered 2015-01-10: 2 via ORAL
  Filled 2015-01-10: qty 2

## 2015-01-10 NOTE — ED Notes (Signed)
Pt reminded that urine sample was needed. Pt verbalized understanding.
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
## 2015-01-10 NOTE — ED Notes (Signed)
Pt requesting medication for pain. MD Rancour made aware.

## 2015-01-10 NOTE — ED Notes (Signed)
MD Rancour at bedside updating pt and family.  

## 2015-01-10 NOTE — ED Notes (Signed)
MD Rancour at bedside updating pt and family.
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
## 2015-01-14 ENCOUNTER — Encounter (HOSPITAL_COMMUNITY): Payer: Self-pay | Admitting: Emergency Medicine

## 2015-01-14 ENCOUNTER — Emergency Department (HOSPITAL_COMMUNITY)
Admission: EM | Admit: 2015-01-14 | Discharge: 2015-01-14 | Disposition: A | Discharge status: Home / Self Care | Payer: Self-pay | Attending: Emergency Medicine | Admitting: Emergency Medicine

## 2015-01-14 DIAGNOSIS — Z720 Tobacco use: Secondary | ICD-10-CM | POA: Insufficient documentation

## 2015-01-14 DIAGNOSIS — Z8659 Personal history of other mental and behavioral disorders: Secondary | ICD-10-CM | POA: Insufficient documentation

## 2015-01-14 DIAGNOSIS — Z760 Encounter for issue of repeat prescription: Secondary | ICD-10-CM | POA: Insufficient documentation

## 2015-01-14 DIAGNOSIS — Z8739 Personal history of other diseases of the musculoskeletal system and connective tissue: Secondary | ICD-10-CM | POA: Insufficient documentation

## 2015-01-14 DIAGNOSIS — S129XXD Fracture of neck, unspecified, subsequent encounter: Secondary | ICD-10-CM | POA: Insufficient documentation

## 2015-01-14 DIAGNOSIS — X58XXXD Exposure to other specified factors, subsequent encounter: Secondary | ICD-10-CM | POA: Insufficient documentation

## 2015-01-14 MED ORDER — CYCLOBENZAPRINE HCL 10 MG PO TABS: 10.0000 mg | ORAL_TABLET | Freq: Three times a day (TID) | ORAL | Status: DC | PRN

## 2015-01-14 MED ORDER — IBUPROFEN 800 MG PO TABS: 800.0000 mg | ORAL_TABLET | Freq: Three times a day (TID) | ORAL | Status: DC

## 2015-01-14 MED ORDER — OXYCODONE-ACETAMINOPHEN 5-325 MG PO TABS: 1.0000 | ORAL_TABLET | ORAL | Status: DC | PRN

## 2015-01-14 NOTE — ED Notes (Signed)
Pt verbalized understanding of no driving and to use caution within 4 hours of taking pain meds due to meds cause drowsiness 

## 2015-01-14 NOTE — Discharge Instructions (Signed)
Medication Refill, Emergency Department °We have refilled your medication today as a courtesy to you. It is best for your medical care, however, to take care of getting refills done through your primary caregiver's office. They have your records and can do a better job of follow-up than we can in the emergency department. °On maintenance medications, we often only prescribe enough medications to get you by until you are able to see your regular caregiver. This is a more expensive way to refill medications. °In the future, please plan for refills so that you will not have to use the emergency department for this. °Thank you for your help. Your help allows us to better take care of the daily emergencies that enter our department. °Document Released: 10/09/2003 Document Revised: 09/14/2011 Document Reviewed: 09/29/2013 °[employer]® Patient Information ©2015 [employer]. This information is not intended to replace advice given to you by your health care provider. Make sure you discuss any questions you have with your health care provider. ° °

## 2015-01-14 NOTE — ED Notes (Signed)
Pt states that he is out of pain medication.

## 2015-01-17 NOTE — ED Provider Notes (Signed)
CSN: [id number]     Arrival date & time 01/14/15  1728 History   First MD Initiated Contact with Patient 01/14/15 1811     Chief Complaint  Patient presents with  . Medication Refill     (Consider location/radiation/quality/duration/timing/severity/associated sxs/prior Treatment) HPI   Robert Kramer is a 39 y.o. male who presents to the Emergency Department requesting additional pain medication.  He was seen here 4 days ago and diagnosed with a cervical spine fx.  He states that he contacted the recommended neurosurgeon's office and unable to f/u in his office for one week.  Returns to ED requesting small amt of medications until his appt.  He states he has been wearing his neck collar constantly, he denies new or worsening symptoms.     Past Medical History  Diagnosis Date  . Anxiety   . Hallux valgus with bunions   . Bipolar 1 disorder   . Mood swings   . Depression   . DDD (degenerative disc disease), lumbar   . Polysubstance abuse   . Drug-seeking behavior    Past Surgical History  Procedure Laterality Date  . Fatty tumor removed from left foot     Family History  Problem Relation Age of Onset  . Cancer Father    History  Substance Use Topics  . Smoking status: Current Every Day Smoker -- 0.50 packs/day for 5 years    Types: Cigarettes  . Smokeless tobacco: Never Used  . Alcohol Use: 0.6 oz/week    1 Cans of beer per week    Review of Systems  Constitutional: Negative for fever and chills.  HENT: Negative for trouble swallowing.   Respiratory: Negative for shortness of breath.   Cardiovascular: Negative for chest pain.  Gastrointestinal: Negative for nausea and vomiting.  Genitourinary: Negative for dysuria and difficulty urinating.  Musculoskeletal: Positive for neck pain. Negative for joint swelling.  Skin: Negative for color change and wound.  Neurological: Negative for dizziness, syncope, weakness, light-headedness, numbness and headaches.  All other  systems reviewed and are negative.     Allergies  Tramadol  Home Medications   Prior to Admission medications   Medication Sig Start Date End Date Taking? Authorizing Provider  acetaminophen (TYLENOL) 500 MG tablet Take 500 mg by mouth every 6 (six) hours as needed for mild pain or moderate pain.    Historical Provider, MD  Aspirin-Acetaminophen-Caffeine (GOODY HEADACHE PO) Take 1 packet by mouth daily as needed (for pain).    Historical Provider, MD  cyclobenzaprine (FLEXERIL) 10 MG tablet Take 1 tablet (10 mg total) by mouth 3 (three) times daily as needed. 01/14/15   Rifky Lapre, PA-C  ibuprofen (ADVIL,MOTRIN) 800 MG tablet Take 1 tablet (800 mg total) by mouth 3 (three) times daily. 01/14/15   Davonn Flanery, PA-C  oxyCODONE-acetaminophen (PERCOCET/ROXICET) 5-325 MG per tablet Take 1 tablet by mouth every 4 (four) hours as needed. 01/14/15   Jazaria Jarecki, PA-C   BP 153/93 mmHg  Pulse 85  Temp(Src) 97.8 F (36.6 C) (Oral)  Resp 16  Ht 5\' 11"  (1.803 m)  Wt 170 lb (77.111 kg)  BMI 23.72 kg/m2  SpO2 96% Physical Exam  Constitutional: He is oriented to person, place, and time. He appears well-developed and well-nourished. No distress.  HENT:  Head: Normocephalic and atraumatic.  Cardiovascular: Normal rate, regular rhythm, normal heart sounds and intact distal pulses.   No murmur heard. Pulmonary/Chest: Effort normal and breath sounds normal. No respiratory distress.  Musculoskeletal:  Pt wearing  aspen collar.  5/5 strength against resistance of bilat UE's.  Gait nml.  No pronator drift.  Neurological: He is alert and oriented to person, place, and time. Coordination normal.  Skin: Skin is warm and dry.  Psychiatric: He has a normal mood and affect.  Nursing note and vitals reviewed.   ED Course  Procedures (including critical care time) Labs Review Labs Reviewed - No data to display  Imaging Review No results found.   EKG Interpretation None      MDM   Final  diagnoses:  Medication refill  Cervical spine fracture, subsequent encounter   Previous imaging reviewed  Pt was seen here four days ago for cervical fx.  Has upcoming appt with neurosurgery.  Here requesting pain medication.  Denies worsening sx's.  Remains NV intact.  Advised that he will need to arrange speciality f/u for ongoing pain control.  Pt verbalized understanding, will rx #15 percocet, ibuprofen and flexeril.  Appears stable for d/c    Pauline Aus, PA-C 01/17/15 1649  Glynn Octave, MD 01/18/15 [phone]

## 2015-02-12 ENCOUNTER — Emergency Department (HOSPITAL_COMMUNITY): Payer: Self-pay

## 2015-02-12 ENCOUNTER — Encounter (HOSPITAL_COMMUNITY): Payer: Self-pay | Admitting: Physical Medicine and Rehabilitation

## 2015-02-12 ENCOUNTER — Emergency Department (HOSPITAL_COMMUNITY)
Admission: EM | Admit: 2015-02-12 | Discharge: 2015-02-12 | Disposition: A | Discharge status: Home / Self Care | Payer: Self-pay | Attending: Emergency Medicine | Admitting: Emergency Medicine

## 2015-02-12 DIAGNOSIS — Z791 Long term (current) use of non-steroidal anti-inflammatories (NSAID): Secondary | ICD-10-CM | POA: Insufficient documentation

## 2015-02-12 DIAGNOSIS — Z720 Tobacco use: Secondary | ICD-10-CM | POA: Insufficient documentation

## 2015-02-12 DIAGNOSIS — Z8659 Personal history of other mental and behavioral disorders: Secondary | ICD-10-CM | POA: Insufficient documentation

## 2015-02-12 DIAGNOSIS — S129XXD Fracture of neck, unspecified, subsequent encounter: Secondary | ICD-10-CM | POA: Insufficient documentation

## 2015-02-12 DIAGNOSIS — S0083XD Contusion of other part of head, subsequent encounter: Secondary | ICD-10-CM | POA: Insufficient documentation

## 2015-02-12 MED ORDER — NAPROXEN 500 MG PO TABS: 500.0000 mg | ORAL_TABLET | Freq: Two times a day (BID) | ORAL | Status: DC

## 2015-02-12 MED ORDER — OXYCODONE-ACETAMINOPHEN 5-325 MG PO TABS: 1.0000 | ORAL_TABLET | ORAL | Status: DC | PRN

## 2015-02-12 NOTE — Discharge Instructions (Signed)
Take the prescribed medication as directed. Follow-up with [place] neurosurgery to ensure that your neck is healing normally. Return to the ED for new or worsening symptoms.

## 2015-02-12 NOTE — ED Notes (Signed)
Aspen collar applied on pt. Pt states that collar is comfortable and fits correctly. Pt states understanding of how to apply Aspen collar and states he has used one before. Extra padding provided to pt.
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
## 2016-10-23 ENCOUNTER — Emergency Department (HOSPITAL_COMMUNITY)
Admission: EM | Admit: 2016-10-23 | Discharge: 2016-10-23 | Disposition: A | Discharge status: Home / Self Care | Payer: Self-pay | Attending: Physician Assistant | Admitting: Physician Assistant

## 2016-10-23 ENCOUNTER — Encounter (HOSPITAL_COMMUNITY): Payer: Self-pay | Admitting: *Deleted

## 2016-10-23 DIAGNOSIS — M2011 Hallux valgus (acquired), right foot: Secondary | ICD-10-CM | POA: Insufficient documentation

## 2016-10-23 DIAGNOSIS — Z79899 Other long term (current) drug therapy: Secondary | ICD-10-CM | POA: Insufficient documentation

## 2016-10-23 DIAGNOSIS — F17210 Nicotine dependence, cigarettes, uncomplicated: Secondary | ICD-10-CM | POA: Insufficient documentation

## 2016-10-23 DIAGNOSIS — B353 Tinea pedis: Secondary | ICD-10-CM | POA: Insufficient documentation

## 2016-10-23 DIAGNOSIS — M21611 Bunion of right foot: Secondary | ICD-10-CM

## 2016-10-23 MED ORDER — HYDROCODONE-ACETAMINOPHEN 5-325 MG PO TABS
2.0000 | ORAL_TABLET | Freq: Once | ORAL | Status: AC
  Administered 2016-10-23: 2 via ORAL
  Filled 2016-10-23: qty 2

## 2016-10-23 MED ORDER — KETOCONAZOLE 2 % EX CREA: 1.0000 "application " | TOPICAL_CREAM | Freq: Two times a day (BID) | CUTANEOUS | 0 refills | Status: DC

## 2016-10-23 NOTE — ED Notes (Signed)
Trinna Post, PA at the bedside

## 2016-10-23 NOTE — ED Provider Notes (Signed)
[facility]   CSN: [id number] Arrival date & time: 10/23/16  1014  By signing my name below, I, Thelma Barge, attest that this documentation has been prepared under the direction and in the presence of [employer]. Electronically Signed: Thelma Barge, Scribe. 10/23/16. 1:06 PM. History   Chief Complaint Chief Complaint  Patient presents with  . Foot Pain     The history is provided by the patient. No language interpreter was used.   HPI Comments: Robert Kramer is a 41 y.o. male who presents to the Emergency Department complaining of 2 weeks of constant, right foot pain that worsened 1 week ago. He states his right first two toes are painful and this is aggravated when applying pressure and ambulating. He has history of calluses and severe bunions and foot deformity due to previous fracture. Pain doesn't alleviate with ibuprofen. Pt denies fever, numbness.   Past Medical History:  Diagnosis Date  . Anxiety   . Bipolar 1 disorder (HCC)   . DDD (degenerative disc disease), lumbar   . Depression   . Drug-seeking behavior   . Hallux valgus with bunions   . Mood swings (HCC)   . Polysubstance abuse     Patient Active Problem List   Diagnosis Date Noted  . Polysubstance dependence (HCC) 04/15/2013  . Drug-seeking behavior 04/15/2013    Past Surgical History:  Procedure Laterality Date  . fatty tumor removed from left foot         Home Medications    Prior to Admission medications   Medication Sig Start Date End Date Taking? Authorizing Provider  acetaminophen (TYLENOL) 500 MG tablet Take 500 mg by mouth every 6 (six) hours as needed for mild pain or moderate pain.    Historical Provider, MD  Aspirin-Acetaminophen-Caffeine (GOODY HEADACHE PO) Take 1 packet by mouth daily as needed (for pain).    Historical Provider, MD  cyclobenzaprine (FLEXERIL) 10 MG tablet Take 1 tablet (10 mg total) by mouth 3 (three) times daily as needed. 01/14/15    Tammy Triplett, PA-C  ibuprofen (ADVIL,MOTRIN) 800 MG tablet Take 1 tablet (800 mg total) by mouth 3 (three) times daily. 01/14/15   Tammy Triplett, PA-C  ketoconazole (NIZORAL) 2 % cream Apply 1 application topically 2 (two) times daily. 10/23/16   Emi Holes, PA-C  naproxen (NAPROSYN) 500 MG tablet Take 1 tablet (500 mg total) by mouth 2 (two) times daily with a meal. 02/12/15   Garlon Hatchet, PA-C  oxyCODONE-acetaminophen (PERCOCET/ROXICET) 5-325 MG per tablet Take 1 tablet by mouth every 4 (four) hours as needed. 02/12/15   Garlon Hatchet, PA-C    Family History Family History  Problem Relation Age of Onset  . Cancer Father     Social History Social History  Substance Use Topics  . Smoking status: Current Every Day Smoker    Packs/day: 0.50    Years: 5.00    Types: Cigarettes  . Smokeless tobacco: Never Used  . Alcohol use 0.6 oz/week    1 Cans of beer per week     Comment: occ     Allergies   Tramadol   Review of Systems Review of Systems  Constitutional: Negative for fever.  Musculoskeletal: Positive for arthralgias.  Skin: Positive for rash.  Neurological: Negative for numbness.     Physical Exam Updated Vital Signs BP 135/85 (BP Location: Right Arm)   Pulse 65   Temp 98 F (36.7 C) (Oral)   Resp 16  Ht  (1.803 m)   Wt 74.8 kg   SpO2 100%   BMI 23.01 kg/m   Physical Exam  Constitutional: He appears well-developed and well-nourished. No distress.  HENT:  Head: Normocephalic and atraumatic.  Mouth/Throat: Oropharynx is clear and moist. No oropharyngeal exudate.  Eyes: Conjunctivae are normal. Pupils are equal, round, and reactive to light. Right eye exhibits no discharge. Left eye exhibits no discharge. No scleral icterus.  Neck: Normal range of motion. Neck supple. No thyromegaly present.  Cardiovascular: Normal rate, regular rhythm, normal heart sounds and intact distal pulses.  Exam reveals no gallop and no friction rub.   No murmur  heard. Pulmonary/Chest: Effort normal and breath sounds normal. No stridor. No respiratory distress. He has no wheezes. He has no rales.  Abdominal: Soft. Bowel sounds are normal. He exhibits no distension. There is no tenderness. There is no rebound and no guarding.  Musculoskeletal: He exhibits no edema.  Right foot with severe hallux valgus. Patient with peeling, mildly maceration in between toes consistent with tinea pedis; callus to second toe that is tender to touch, tenderness to base of second toe. No erythema or warmth. Normal sensation, cap refill <2secs  Lymphadenopathy:    He has no cervical adenopathy.  Neurological: He is alert. Coordination normal.  Skin: Skin is warm and dry. No rash noted. He is not diaphoretic. No pallor.  Psychiatric: He has a normal mood and affect.  Nursing note and vitals reviewed.    ED Treatments / Results  DIAGNOSTIC STUDIES: Oxygen Saturation is 98% on RA, normal by my interpretation.    COORDINATION OF CARE: 12:16 PM Discussed treatment plan with pt which included XRAY of the foot and anti-inflammatory medication at bedside but pt was displeased. Tramadol was offered but patient noted he is allergic to this medication. He denies wanting to have an XR of the LLE. He is given 1 dose of hydrocodone-acetaminophen 10-325 while in the ED and will be discharged home with anti-fungal medication.   Labs (all labs ordered are listed, but only abnormal results are displayed) Labs Reviewed - No data to display  EKG  EKG Interpretation None       Radiology No results found.  Procedures Procedures (including critical care time)  Medications Ordered in ED Medications  HYDROcodone-acetaminophen (NORCO/VICODIN) 5-325 MG per tablet 2 tablet (2 tablets Oral Given 10/23/16 1224)     Initial Impression / Assessment and Plan / ED Course  I have reviewed the triage vital signs and the nursing notes.  Pertinent labs & imaging results that were  available during my care of the patient were reviewed by me and considered in my medical decision making (see chart for details).     I offered patient x-ray to evaluate tenderness and rule out any osteomyelitis or other emergent pathology. Patient states he did not want an x-ray and just wanted to get pain medicine before he can follow-up with a podiatrist. I advised patient that I cannot treat his neck pain with narcotic pain medication. I offered patient tramadol, however he states he is allergic. I will treat patient's tinea pedis with ketoconazole cream. Patient is to follow up with podiatrist. Return precautions discussed. Patient understands the plan. Patient vitals stable throughout ED course discharged in satisfactory condition.  Final Clinical Impressions(s) / ED Diagnoses   Final diagnoses:  Tinea pedis of right foot  Bunion of great toe of right foot    New Prescriptions Discharge Medication List as of 10/23/2016 12:31  PM    START taking these medications   Details  ketoconazole (NIZORAL) 2 % cream Apply 1 application topically 2 (two) times daily., Starting Fri 10/23/2016, Print      I personally performed the services described in this documentation, which was scribed in my presence. The recorded information has been reviewed and is accurate.    Emi Holes, PA-C 10/25/16 1059    Courteney Randall An, MD 10/29/16 9562

## 2016-10-23 NOTE — ED Triage Notes (Signed)
PT states hx of bunions to toes of R foot.  States pain increasing to where it's difficult to work.

## 2016-10-23 NOTE — Discharge Instructions (Signed)
Medications: ketoconazole  Treatment: Apply ketoconazole twice daily for 4 weeks or until told otherwise by your podiatrist.   Follow-up: Please follow up with Dr. Logan Bores, a podiatrist, for further evaluation and treatment of your foot. Please return to the emergency department if you develop any new or worsening symptoms including fever, development of redness, and spreading of redness or pain up your foot.

## 2016-12-13 ENCOUNTER — Encounter (HOSPITAL_COMMUNITY): Payer: Self-pay

## 2016-12-13 ENCOUNTER — Emergency Department (HOSPITAL_COMMUNITY): Payer: Self-pay

## 2016-12-13 ENCOUNTER — Emergency Department (HOSPITAL_COMMUNITY)
Admission: EM | Admit: 2016-12-13 | Discharge: 2016-12-13 | Disposition: A | Discharge status: Home / Self Care | Payer: Self-pay | Attending: Emergency Medicine | Admitting: Emergency Medicine

## 2016-12-13 DIAGNOSIS — M2011 Hallux valgus (acquired), right foot: Secondary | ICD-10-CM | POA: Insufficient documentation

## 2016-12-13 DIAGNOSIS — S90111A Contusion of right great toe without damage to nail, initial encounter: Secondary | ICD-10-CM | POA: Insufficient documentation

## 2016-12-13 DIAGNOSIS — Y33XXXA Other specified events, undetermined intent, initial encounter: Secondary | ICD-10-CM | POA: Insufficient documentation

## 2016-12-13 DIAGNOSIS — F17210 Nicotine dependence, cigarettes, uncomplicated: Secondary | ICD-10-CM | POA: Insufficient documentation

## 2016-12-13 DIAGNOSIS — Y939 Activity, unspecified: Secondary | ICD-10-CM | POA: Insufficient documentation

## 2016-12-13 DIAGNOSIS — Y929 Unspecified place or not applicable: Secondary | ICD-10-CM | POA: Insufficient documentation

## 2016-12-13 DIAGNOSIS — Y999 Unspecified external cause status: Secondary | ICD-10-CM | POA: Insufficient documentation

## 2016-12-13 MED ORDER — IBUPROFEN 400 MG PO TABS
400.0000 mg | ORAL_TABLET | Freq: Once | ORAL | Status: AC
  Administered 2016-12-13: 400 mg via ORAL
  Filled 2016-12-13: qty 1

## 2016-12-13 MED ORDER — NAPROXEN 500 MG PO TABS: 500.0000 mg | ORAL_TABLET | Freq: Two times a day (BID) | ORAL | 0 refills | Status: DC

## 2016-12-13 NOTE — ED Notes (Signed)
Pt here for right great toe pain and discoloration. No known injury. Noticed it after work on Friday.

## 2016-12-13 NOTE — ED Notes (Signed)
DECLINED W/C 

## 2016-12-13 NOTE — ED Triage Notes (Signed)
Patient here with right great toe pain with bruising after injuring at work, NAD

## 2016-12-13 NOTE — ED Provider Notes (Signed)
[facility]-EMER[facility]ENCY DEPT Provider Note   CSN: [id number] Arrival date & time: 12/13/16  1240  By signing my name below, I, Rosario AdieWilliam Andrew Hiatt, attest that this documentation has been prepared under the direction and in the presence of Linwood DibblesKnapp, Zuzu Befort, MD. Electronically Signed: Rosario AdieWilliam Andrew Hiatt, ED Scribe. 12/13/16. 1:06 PM.  History   Chief Complaint Chief Complaint  Patient presents with  . Toe Injury   The history is provided by the patient. No language interpreter was used.    HPI Comments: Robert Kramer is a 41 y.o. male w/ a PMHx of polysubstance abuse w/ drug seeking behavior, depression, anxiety, and BP1 disorder, who presents to the Emergency Department complaining of gradual onset, worsening right great toe pain beginning last night. Per pt, he has not recently sustained any trauma or injury to the area; however, last night he noticed that with walking he had some pain to the area and since then his pain has worsened and there is some associated bruising over the area. He reports that he has previously injured the area in the distant past. His pain is worse with ambulation and weight bearing. No noted treatments for his pain were tried prior to coming into the ED. He denies fever, chest pain, abdominal pain, or any other associated symptoms.   Past Medical History:  Diagnosis Date  . Anxiety   . Bipolar 1 disorder (HCC)   . DDD (degenerative disc disease), lumbar   . Depression   . Drug-seeking behavior   . Hallux valgus with bunions   . Mood swings (HCC)   . Polysubstance abuse    Patient Active Problem List   Diagnosis Date Noted  . Polysubstance dependence (HCC) 04/15/2013  . Drug-seeking behavior 04/15/2013   Past Surgical History:  Procedure Laterality Date  . fatty tumor removed from left foot      Home Medications    Prior to Admission medications   Medication Sig Start Date End Date Taking? Authorizing Provider  acetaminophen (TYLENOL) 500 MG tablet Take 500  mg by mouth every 6 (six) hours as needed for mild pain or moderate pain.    [provider]  Aspirin-Acetaminophen-Caffeine (GOODY HEADACHE PO) Take 1 packet by mouth daily as needed (for pain).    [provider]  cyclobenzaprine (FLEXERIL) 10 MG tablet Take 1 tablet (10 mg total) by mouth 3 (three) times daily as needed. 01/14/15   Triplett, Tammy, PA-C  ibuprofen (ADVIL,MOTRIN) 800 MG tablet Take 1 tablet (800 mg total) by mouth 3 (three) times daily. 01/14/15   Triplett, Tammy, PA-C  ketoconazole (NIZORAL) 2 % cream Apply 1 application topically 2 (two) times daily. 10/23/16   Law, Waylan BogaAlexandra M, PA-C  naproxen (NAPROSYN) 500 MG tablet Take 1 tablet (500 mg total) by mouth 2 (two) times daily with a meal. 02/12/15   Garlon HatchetSanders, Lisa M, PA-C  oxyCODONE-acetaminophen (PERCOCET/ROXICET) 5-325 MG per tablet Take 1 tablet by mouth every 4 (four) hours as needed. 02/12/15   Garlon HatchetSanders, Lisa M, PA-C   Family History Family History  Problem Relation Age of Onset  . Cancer Father    Social History Social History  Substance Use Topics  . Smoking status: Current Every Day Smoker    Packs/day: 0.50    Years: 5.00    Types: Cigarettes  . Smokeless tobacco: Never Used  . Alcohol use 0.6 oz/week    1 Cans of beer per week     Comment: occ   Allergies   Tramadol  Review  of Systems Review of Systems  Constitutional: Negative for fever.  Cardiovascular: Negative for chest pain.  Gastrointestinal: Negative for abdominal pain.  Musculoskeletal: Positive for arthralgias and myalgias.   Physical Exam Updated Vital Signs BP (!) 138/97   Pulse 79   Temp 97.8 F (36.6 C) (Oral)   Resp 18   SpO2 97%   Physical Exam  Constitutional: He appears well-developed and well-nourished. No distress.  HENT:  Head: Normocephalic and atraumatic.  Right Ear: External ear normal.  Left Ear: External ear normal.  Eyes: Conjunctivae are normal. Right eye exhibits no discharge. Left eye exhibits no  discharge. No scleral icterus.  Neck: Neck supple. No tracheal deviation present.  Cardiovascular: Normal rate.   Pulmonary/Chest: Effort normal. No stridor. No respiratory distress.  Abdominal: He exhibits no distension.  Musculoskeletal: He exhibits no edema.  Hallux valgus to the right foot. Bruising is noted to the right great toe and to the proximal portion of distal foot. He is NVI. No erythema or increased warmth. No ankle tenderness, no calf swelling.   Neurological: He is alert. Cranial nerve deficit: no gross deficits.  Skin: Skin is warm and dry. No rash noted.  Psychiatric: He has a normal mood and affect.  Nursing note and vitals reviewed.  ED Treatments / Results  DIAGNOSTIC STUDIES: Oxygen Saturation is 97% on RA, normal by my interpretation.   COORDINATION OF CARE: 1:05 PM-Discussed next steps with pt. Pt verbalized understanding and is agreeable with the plan.   Radiology Dg Foot Complete Right  Result Date: 12/13/2016 CLINICAL DATA:  Right great toe pain and bruising. Initial encounter. EXAM: RIGHT FOOT COMPLETE - 3+ VIEW COMPARISON:  04/20/2014 radiographs FINDINGS: There is no evidence of acute fracture, subluxation or dislocation. Hallux valgus again identified. No focal bony lesions or erosions are noted. IMPRESSION: No evidence of acute abnormality. Hallux valgus. Electronically Signed   By: Harmon Pier M.D.   On: 12/13/2016 13:27   Procedures Procedures   Medications Ordered in ED Medications  ibuprofen (ADVIL,MOTRIN) tablet 400 mg (400 mg Oral Given 12/13/16 1311)    Initial Impression / Assessment and Plan / ED Course  I have reviewed the triage vital signs and the nursing notes.  Pertinent labs & imaging results that were available during my care of the patient were reviewed by me and considered in my medical decision making (see chart for details).   No fx on xray.  Supportive care.  Consider wearing a brace.  Follow up with podiatrist or orthopedics  as needed   Final Clinical Impressions(s) / ED Diagnoses   Final diagnoses:  Contusion of right great toe without damage to nail, initial encounter  Hallux valgus of right foot   New Prescriptions naprosyn I personally performed the services described in this documentation, which was scribed in my presence.  The recorded information has been reviewed and is accurate.     Linwood Dibbles, MD 12/13/16 1355

## 2016-12-13 NOTE — ED Notes (Signed)
Patient transported to X-ray 

## 2016-12-13 NOTE — ED Notes (Signed)
Returned from xray

## 2016-12-13 NOTE — Discharge Instructions (Signed)
Apply ice, consider wearing a bunion bootie or brace, they should be available at medical supply stores or possibly local pharmacies, [facility].  Follow up with an orthopedic doctor or podiatrist if the symptoms are not improving

## 2020-03-05 ENCOUNTER — Other Ambulatory Visit: Payer: Self-pay

## 2020-03-05 ENCOUNTER — Encounter (HOSPITAL_COMMUNITY): Payer: Self-pay | Admitting: Emergency Medicine

## 2020-03-05 ENCOUNTER — Emergency Department (HOSPITAL_COMMUNITY)
Admission: EM | Admit: 2020-03-05 | Discharge: 2020-03-06 | Disposition: A | Discharge status: Home / Self Care | Payer: Self-pay | Attending: Emergency Medicine | Admitting: Emergency Medicine

## 2020-03-05 DIAGNOSIS — D72829 Elevated white blood cell count, unspecified: Secondary | ICD-10-CM | POA: Insufficient documentation

## 2020-03-05 DIAGNOSIS — F1110 Opioid abuse, uncomplicated: Secondary | ICD-10-CM | POA: Insufficient documentation

## 2020-03-05 DIAGNOSIS — Z79899 Other long term (current) drug therapy: Secondary | ICD-10-CM | POA: Insufficient documentation

## 2020-03-05 DIAGNOSIS — F17210 Nicotine dependence, cigarettes, uncomplicated: Secondary | ICD-10-CM | POA: Insufficient documentation

## 2020-03-05 DIAGNOSIS — Z7982 Long term (current) use of aspirin: Secondary | ICD-10-CM | POA: Insufficient documentation

## 2020-03-05 LAB — URINALYSIS, ROUTINE W REFLEX MICROSCOPIC
Bilirubin Urine: NEGATIVE
Glucose, UA: NEGATIVE mg/dL
Ketones, ur: NEGATIVE mg/dL
Nitrite: NEGATIVE
Protein, ur: NEGATIVE mg/dL
Specific Gravity, Urine: 1.021 (ref 1.005–1.030)
pH: 6 (ref 5.0–8.0)

## 2020-03-05 LAB — CBC
HCT: 40.8 % (ref 39.0–52.0)
Hemoglobin: 12.9 g/dL — ABNORMAL LOW (ref 13.0–17.0)
MCH: 27.6 pg (ref 26.0–34.0)
MCHC: 31.6 g/dL (ref 30.0–36.0)
MCV: 87.2 fL (ref 80.0–100.0)
Platelets: 51 10*3/uL — ABNORMAL LOW (ref 150–400)
RBC: 4.68 MIL/uL (ref 4.22–5.81)
RDW: 13.5 % (ref 11.5–15.5)
WBC: 18.8 10*3/uL — ABNORMAL HIGH (ref 4.0–10.5)
nRBC: 0 % (ref 0.0–0.2)

## 2020-03-05 LAB — BASIC METABOLIC PANEL
Anion gap: 11 (ref 5–15)
BUN: 24 mg/dL — ABNORMAL HIGH (ref 6–20)
CO2: 26 mmol/L (ref 22–32)
Calcium: 8.4 mg/dL — ABNORMAL LOW (ref 8.9–10.3)
Chloride: 90 mmol/L — ABNORMAL LOW (ref 98–111)
Creatinine, Ser: 1.01 mg/dL (ref 0.61–1.24)
GFR calc Af Amer: >60 mL/min (ref >60)
GFR calc non Af Amer: >60 mL/min (ref >60)
Glucose, Bld: 207 mg/dL — ABNORMAL HIGH (ref 70–99)
Potassium: 3.6 mmol/L (ref 3.5–5.1)
Sodium: 127 mmol/L — ABNORMAL LOW (ref 135–145)

## 2020-03-05 NOTE — ED Triage Notes (Signed)
Patient reports generalized pain after not using Heroin in 2 days. A/OX4

## 2020-03-06 ENCOUNTER — Encounter (HOSPITAL_COMMUNITY): Payer: Self-pay

## 2020-03-06 ENCOUNTER — Emergency Department (HOSPITAL_COMMUNITY)
Admission: EM | Admit: 2020-03-06 | Discharge: 2020-03-06 | Disposition: A | Discharge status: Home / Self Care | Payer: Self-pay | Attending: Emergency Medicine | Admitting: Emergency Medicine

## 2020-03-06 ENCOUNTER — Emergency Department (HOSPITAL_COMMUNITY): Payer: Self-pay

## 2020-03-06 ENCOUNTER — Other Ambulatory Visit: Payer: Self-pay

## 2020-03-06 DIAGNOSIS — F909 Attention-deficit hyperactivity disorder, unspecified type: Secondary | ICD-10-CM | POA: Insufficient documentation

## 2020-03-06 DIAGNOSIS — R112 Nausea with vomiting, unspecified: Secondary | ICD-10-CM | POA: Insufficient documentation

## 2020-03-06 DIAGNOSIS — Z7982 Long term (current) use of aspirin: Secondary | ICD-10-CM | POA: Insufficient documentation

## 2020-03-06 DIAGNOSIS — Z79899 Other long term (current) drug therapy: Secondary | ICD-10-CM | POA: Insufficient documentation

## 2020-03-06 DIAGNOSIS — R197 Diarrhea, unspecified: Secondary | ICD-10-CM | POA: Insufficient documentation

## 2020-03-06 DIAGNOSIS — M7918 Myalgia, other site: Secondary | ICD-10-CM | POA: Insufficient documentation

## 2020-03-06 DIAGNOSIS — F17210 Nicotine dependence, cigarettes, uncomplicated: Secondary | ICD-10-CM | POA: Insufficient documentation

## 2020-03-06 DIAGNOSIS — F419 Anxiety disorder, unspecified: Secondary | ICD-10-CM | POA: Insufficient documentation

## 2020-03-06 DIAGNOSIS — F1120 Opioid dependence, uncomplicated: Secondary | ICD-10-CM | POA: Insufficient documentation

## 2020-03-06 DIAGNOSIS — F1193 Opioid use, unspecified with withdrawal: Secondary | ICD-10-CM

## 2020-03-06 DIAGNOSIS — F1210 Cannabis abuse, uncomplicated: Secondary | ICD-10-CM | POA: Insufficient documentation

## 2020-03-06 MED ORDER — METHOCARBAMOL 500 MG PO TABS
500.0000 mg | ORAL_TABLET | Freq: Three times a day (TID) | ORAL | Status: DC | PRN
  Administered 2020-03-06: 500 mg via ORAL
  Filled 2020-03-06: qty 1

## 2020-03-06 MED ORDER — ONDANSETRON 4 MG PO TBDP
4.0000 mg | ORAL_TABLET | Freq: Four times a day (QID) | ORAL | Status: DC | PRN
  Administered 2020-03-06: 4 mg via ORAL
  Filled 2020-03-06: qty 1

## 2020-03-06 MED ORDER — DICYCLOMINE HCL 20 MG PO TABS
20.0000 mg | ORAL_TABLET | Freq: Four times a day (QID) | ORAL | Status: DC | PRN
  Administered 2020-03-06: 20 mg via ORAL
  Filled 2020-03-06: qty 1

## 2020-03-06 MED ORDER — HYDROXYZINE HCL 25 MG PO TABS: 25.0000 mg | ORAL_TABLET | Freq: Three times a day (TID) | ORAL | 0 refills | Status: DC | PRN

## 2020-03-06 MED ORDER — METHOCARBAMOL 500 MG PO TABS: 500.0000 mg | ORAL_TABLET | Freq: Two times a day (BID) | ORAL | 0 refills | Status: DC

## 2020-03-06 MED ORDER — DICYCLOMINE HCL 20 MG PO TABS: 20.0000 mg | ORAL_TABLET | Freq: Two times a day (BID) | ORAL | 0 refills | Status: DC

## 2020-03-06 MED ORDER — NAPROXEN 250 MG PO TABS
500.0000 mg | ORAL_TABLET | Freq: Once | ORAL | Status: AC
Start: 2020-03-06 — End: 2020-03-06
  Administered 2020-03-06: 500 mg via ORAL
  Filled 2020-03-06: qty 2

## 2020-03-06 MED ORDER — NAPROXEN 500 MG PO TABS: 500.0000 mg | ORAL_TABLET | Freq: Two times a day (BID) | ORAL | 0 refills | Status: DC

## 2020-03-06 MED ORDER — HYDROXYZINE HCL 25 MG PO TABS
25.0000 mg | ORAL_TABLET | Freq: Four times a day (QID) | ORAL | Status: DC | PRN
  Administered 2020-03-06: 25 mg via ORAL
  Filled 2020-03-06: qty 1

## 2020-03-06 MED ORDER — METHOCARBAMOL 500 MG PO TABS
500.0000 mg | ORAL_TABLET | Freq: Once | ORAL | Status: AC
  Administered 2020-03-06: 500 mg via ORAL
  Filled 2020-03-06: qty 1

## 2020-03-06 MED ORDER — ONDANSETRON 4 MG PO TBDP: 4.0000 mg | ORAL_TABLET | Freq: Three times a day (TID) | ORAL | 0 refills | Status: DC | PRN

## 2020-03-06 MED ORDER — LOPERAMIDE HCL 2 MG PO CAPS
2.0000 mg | ORAL_CAPSULE | ORAL | Status: DC | PRN
  Administered 2020-03-06: 2 mg via ORAL
  Filled 2020-03-06: qty 1

## 2020-03-06 MED ORDER — DICYCLOMINE HCL 10 MG PO CAPS
10.0000 mg | ORAL_CAPSULE | Freq: Once | ORAL | Status: AC
  Administered 2020-03-06: 10 mg via ORAL
  Filled 2020-03-06: qty 1

## 2020-03-06 MED ORDER — LOPERAMIDE HCL 2 MG PO CAPS: 2.0000 mg | ORAL_CAPSULE | Freq: Four times a day (QID) | ORAL | 0 refills | Status: DC | PRN

## 2020-03-06 MED ORDER — NAPROXEN 500 MG PO TABS
500.0000 mg | ORAL_TABLET | Freq: Two times a day (BID) | ORAL | Status: DC | PRN
  Administered 2020-03-06: 500 mg via ORAL
  Filled 2020-03-06: qty 1

## 2020-03-06 MED ORDER — ONDANSETRON 4 MG PO TBDP
4.0000 mg | ORAL_TABLET | Freq: Once | ORAL | Status: AC
  Administered 2020-03-06: 4 mg via ORAL
  Filled 2020-03-06: qty 1

## 2020-03-06 NOTE — Discharge Instructions (Signed)
Take the prescribed medication as directed.  These can help with your withdrawal symptoms.  Please make sure to drink lots of fluids and stay hydrated. Blood cultures have been sent and should come back later today or early tomorrow morning. Follow-up with one of the outpatient detox centers to start a program. Return to the ED for new or worsening symptoms.

## 2020-03-06 NOTE — ED Triage Notes (Signed)
Pt refused his xrays and told the xray tech that he was here for heroin withdrawal

## 2020-03-06 NOTE — ED Triage Notes (Signed)
Pt complains of right ankle pain from stepping off the sidewalk, no deformity noted

## 2020-03-06 NOTE — Discharge Instructions (Signed)
Continue to treat your withdrawal symptoms with as needed medication. Use Zofran as needed for nausea or vomiting. Use naproxen as needed for pain. Use robaxin as needed for muscle spasm. Use Imodium as needed for diarrhea. Use hydroxyzine as needed for anxiety or agitation. Use Bentyl as needed for abdominal pain or cramping. Make sure you are staying well-hydrated water. Our peer support team has been consulted, they should be reaching out to you to help you through your detox. There is information in the paperwork regarding inpatient and outpatient substance abuse programs in the area.

## 2020-03-06 NOTE — ED Provider Notes (Signed)
[facility]-EMERGENCY DEPT Provider Note   CSN: [id number] Arrival date & time: 03/06/20  2018     History Chief Complaint  Patient presents with  . heroin withdrawal    Robert Kramer is a 44 y.o. male presenting for heroin withdrawal.  Patient states he last used heroin 3 days ago.  He is going through withdrawal, reports vomiting, cramps, muscle spasms, diarrhea, anxiety.  He states he was seen at [facility] yesterday for the same, but feels he was discharged too soon.  He has gone through withdrawal before, but has been a long time.  He denies other substance use or alcohol use.  Additional history obtained from chart review.  Patient with a history of anxiety, bipolar, drug-seeking behavior, depression, mood swings, polysubstance abuse.  I reviewed patient's recent ED visit from yesterday, he was treated symptomatically and given prescriptions for symptomatic management.  When asked, patient states he lost/threw away the prescriptions.  Additionally, patient when patient first directed he said he was here for ankle pain.  However he refused x-ray.  When I asked, patient states he did injure his ankle, but he is here solely for detox management.  HPI     Past Medical History:  Diagnosis Date  . Anxiety   . Bipolar 1 disorder (HCC)   . DDD (degenerative disc disease), lumbar   . Depression   . Drug-seeking behavior   . Hallux valgus with bunions   . Mood swings   . Polysubstance abuse [facility])     Patient Active Problem List   Diagnosis Date Noted  . Polysubstance dependence (HCC) 04/15/2013  . Drug-seeking behavior 04/15/2013    Past Surgical History:  Procedure Laterality Date  . 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 Cancer Father     Social History   Tobacco Use  . Smoking status: Current Every Day Smoker    Packs/day: 0.50    Years: 5.00    Pack years: 2.50    Types: Cigarettes  .  Smokeless tobacco: Never Used  Substance Use Topics  . Alcohol use: Yes    Alcohol/week: 1.0 standard drink    Types: 1 Cans of beer per week    Comment: occ  . Drug use: No    Types: Cocaine, Marijuana    Comment: opiates - quit 2017    Home Medications Prior to Admission medications   Medication Sig Start Date End Date Taking? Authorizing Provider  acetaminophen (TYLENOL) 500 MG tablet Take 500 mg by mouth every 6 (six) hours as needed for mild pain or moderate pain.    [provider]  Aspirin-Acetaminophen-Caffeine (GOODY HEADACHE PO) Take 1 packet by mouth daily as needed (for pain).    [provider]  cyclobenzaprine (FLEXERIL) 10 MG tablet Take 1 tablet (10 mg total) by mouth 3 (three) times daily as needed. 01/14/15   Triplett, Tammy, PA-C  dicyclomine (BENTYL) 20 MG tablet Take 1 tablet (20 mg total) by mouth 2 (two) times daily. 03/06/20   Ileanna Gemmill, PA-C  hydrOXYzine (ATARAX/VISTARIL) 25 MG tablet Take 1 tablet (25 mg total) by mouth every 8 (eight) hours as needed. 03/06/20   Warner Laduca, PA-C  ibuprofen (ADVIL,MOTRIN) 800 MG tablet Take 1 tablet (800 mg total) by mouth 3 (three) times daily. 01/14/15   Triplett, Tammy, PA-C  ketoconazole (NIZORAL) 2 % cream Apply 1 application topically 2 (two) times  daily. 10/23/16   Emi Holes, PA-C  loperamide (IMODIUM) 2 MG capsule Take 1 capsule (2 mg total) by mouth 4 (four) times daily as needed for diarrhea or loose stools. 03/06/20   Phenix Vandermeulen, PA-C  methocarbamol (ROBAXIN) 500 MG tablet Take 1 tablet (500 mg total) by mouth 2 (two) times daily. 03/06/20   Pa Tennant, PA-C  naproxen (NAPROSYN) 500 MG tablet Take 1 tablet (500 mg total) by mouth 2 (two) times daily with a meal. 03/06/20   Avonne Berkery, PA-C  ondansetron (ZOFRAN ODT) 4 MG disintegrating tablet Take 1 tablet (4 mg total) by mouth every 8 (eight) hours as needed for nausea. 03/06/20   Annlouise Gerety, PA-C    oxyCODONE-acetaminophen (PERCOCET/ROXICET) 5-325 MG per tablet Take 1 tablet by mouth every 4 (four) hours as needed. 02/12/15   Garlon Hatchet, PA-C    Allergies    Tramadol  Review of Systems   Review of Systems  Gastrointestinal: Positive for diarrhea, nausea and vomiting.  Musculoskeletal: Positive for myalgias.  Psychiatric/Behavioral: The patient is nervous/anxious.   All other systems reviewed and are negative.   Physical Exam Updated Vital Signs BP 104/87   Pulse 76   Temp 98 F (36.7 C) (Oral)   Resp 16   SpO2 96%   Physical Exam Vitals and nursing note reviewed.  Constitutional:      General: He is not in acute distress.    Appearance: He is well-developed.     Comments: Sitting in the bed in no acute distress  HENT:     Head: Normocephalic and atraumatic.  Eyes:     Conjunctiva/sclera: Conjunctivae normal.     Pupils: Pupils are equal, round, and reactive to light.  Cardiovascular:     Rate and Rhythm: Normal rate and regular rhythm.     Pulses: Normal pulses.  Pulmonary:     Effort: Pulmonary effort is normal. No respiratory distress.     Breath sounds: Normal breath sounds. No wheezing.  Abdominal:     General: There is no distension.     Palpations: Abdomen is soft. There is no mass.     Tenderness: There is no abdominal tenderness. There is no guarding or rebound.  Musculoskeletal:        General: Normal range of motion.     Cervical back: Normal range of motion and neck supple.  Skin:    General: Skin is warm and dry.     Capillary Refill: Capillary refill takes less than 2 seconds.  Neurological:     Mental Status: He is alert and oriented to person, place, and time.     ED Results / Procedures / Treatments   Labs (all labs ordered are listed, but only abnormal results are displayed) Labs Reviewed - No data to display  EKG None  Radiology No results found.  Procedures Procedures (including critical care time)  Medications Ordered  in ED Medications  dicyclomine (BENTYL) tablet 20 mg (20 mg Oral Given 03/06/20 2306)  hydrOXYzine (ATARAX/VISTARIL) tablet 25 mg (25 mg Oral Given 03/06/20 2306)  loperamide (IMODIUM) capsule 2-4 mg (2 mg Oral Given 03/06/20 2306)  methocarbamol (ROBAXIN) tablet 500 mg (500 mg Oral Given 03/06/20 2306)  naproxen (NAPROSYN) tablet 500 mg (500 mg Oral Given 03/06/20 2306)  ondansetron (ZOFRAN-ODT) disintegrating tablet 4 mg (4 mg Oral Given 03/06/20 2306)    ED Course  I have reviewed the triage vital signs and the nursing notes.  Pertinent labs & imaging results that  were available during my care of the patient were reviewed by me and considered in my medical decision making (see chart for details).    MDM Rules/Calculators/A&P                          Patient presenting for heroin withdrawal.  On exam, patient appears nontoxic.  He is not tachycardic, vitals are stable.  Will once again give symptomatic medications in the ED, however encourage patient to follow-up with outpatient resources.  Will send his prescriptions rectally to the pharmacy.  Patient tolerated p.o. without difficulty.  Will consult with peers support.  At this time, patient appears safe for discharge.  Return precautions given.  Patient states he understands and agrees to plan.  Final Clinical Impression(s) / ED Diagnoses Final diagnoses:  Heroin withdrawal (HCC)    Rx / DC Orders ED Discharge Orders         Ordered    dicyclomine (BENTYL) 20 MG tablet  2 times daily        03/06/20 2255    ondansetron (ZOFRAN ODT) 4 MG disintegrating tablet  Every 8 hours PRN        03/06/20 2255    naproxen (NAPROSYN) 500 MG tablet  2 times daily with meals        03/06/20 2255    methocarbamol (ROBAXIN) 500 MG tablet  2 times daily        03/06/20 2255    loperamide (IMODIUM) 2 MG capsule  4 times daily PRN        03/06/20 2255    hydrOXYzine (ATARAX/VISTARIL) 25 MG tablet  Every 8 hours PRN        03/06/20 2255             Alveria Apley, PA-C 03/06/20 2349    Benjiman Core, MD 03/11/20 [phone]

## 2020-03-06 NOTE — ED Provider Notes (Signed)
[facility] EMERGENCY DEPARTMENT Provider Note   CSN: [id number] Arrival date & time: 03/05/20  1608     History Chief Complaint  Patient presents with  . Withdrawal    Robert Kramer is a 44 y.o. male.  The history is provided by the patient and medical records.    44 y.o. M with hx of anxiety, bipolar disorder, depression, polysubstance abuse, drug seeking behavior, presenting to the ED requesting detox from heroin.  He states he has been using daily for the past 5-6 years.  States he used 2 days ago with a dirty needle in his left hand.  States afterwards he came to the realization that he was at a very low point and decided to stop cold [place].  He denies any drug use in the past 48 hours.  Denies EtOH.  States he has tried going on suboxone a few times in the past but always gets back on heroin.  He states now he is feeling sweaty, has diarrhea, nausea, and does not want to eat.  States he just feels "terrible".  He denies SI/HI/AVH.  He states he is committed to staying off heroin and wants to go to a detox facility.  He states he is also concerned he may have a "blood infection" from the dirty needle.  Denies fever/chills.  Past Medical History:  Diagnosis Date  . Anxiety   . Bipolar 1 disorder (HCC)   . DDD (degenerative disc disease), lumbar   . Depression   . Drug-seeking behavior   . Hallux valgus with bunions   . Mood swings   . Polysubstance abuse [facility])     Patient Active Problem List   Diagnosis Date Noted  . Polysubstance dependence (HCC) 04/15/2013  . Drug-seeking behavior 04/15/2013    Past Surgical History:  Procedure Laterality Date  . 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 Cancer Father     Social History   Tobacco Use  . Smoking status: Current Every Day Smoker    Packs/day: 0.50    Years: 5.00    Pack years: 2.50    Types: Cigarettes  . Smokeless tobacco: Never Used    Substance Use Topics  . Alcohol use: Yes    Alcohol/week: 1.0 standard drink    Types: 1 Cans of beer per week    Comment: occ  . Drug use: No    Types: Cocaine, Marijuana    Comment: opiates - quit 2017    Home Medications Prior to Admission medications   Medication Sig Start Date End Date Taking? Authorizing Provider  acetaminophen (TYLENOL) 500 MG tablet Take 500 mg by mouth every 6 (six) hours as needed for mild pain or moderate pain.    [provider]  Aspirin-Acetaminophen-Caffeine (GOODY HEADACHE PO) Take 1 packet by mouth daily as needed (for pain).    [provider]  cyclobenzaprine (FLEXERIL) 10 MG tablet Take 1 tablet (10 mg total) by mouth 3 (three) times daily as needed. 01/14/15   Triplett, Tammy, PA-C  ibuprofen (ADVIL,MOTRIN) 800 MG tablet Take 1 tablet (800 mg total) by mouth 3 (three) times daily. 01/14/15   Triplett, Tammy, PA-C  ketoconazole (NIZORAL) 2 % cream Apply 1 application topically 2 (two) times daily. 10/23/16   Law, Waylan Boga, PA-C  naproxen (NAPROSYN) 500 MG tablet Take 1 tablet (500 mg total) by mouth 2 (two) times daily  with a meal. 12/13/16   Linwood Dibbles, MD  oxyCODONE-acetaminophen (PERCOCET/ROXICET) 5-325 MG per tablet Take 1 tablet by mouth every 4 (four) hours as needed. 02/12/15   Garlon Hatchet, PA-C    Allergies    Tramadol  Review of Systems   Review of Systems  Psychiatric/Behavioral:       Wants detox  All other systems reviewed and are negative.   Physical Exam Updated Vital Signs BP 103/69 (BP Location: Left Arm)   Pulse (!) 109   Resp 16   Ht 5\' 11"  (1.803 m)   Wt 59 kg   SpO2 100%   BMI 18.13 kg/m   Physical Exam Vitals and nursing note reviewed.  Constitutional:      Appearance: He is well-developed.     Comments: Calm, cooperative, not diaphoretic or distressed  HENT:     Head: Normocephalic and atraumatic.  Eyes:     Conjunctiva/sclera: Conjunctivae normal.     Pupils: Pupils are equal, round,  and reactive to light.  Cardiovascular:     Rate and Rhythm: Normal rate and regular rhythm.     Heart sounds: Normal heart sounds.     Comments: No murmur noted Pulmonary:     Effort: Pulmonary effort is normal.     Breath sounds: Normal breath sounds.  Abdominal:     General: Bowel sounds are normal.     Palpations: Abdomen is soft.  Musculoskeletal:        General: Normal range of motion.     Cervical back: Normal range of motion.     Comments: Mildly swollen vein to left dorsal hand, no associated cellulitic changes, compartment soft and compressible, radial pulse intact, moving fingers normally, no splint hemorrhages noted to the fingers  Skin:    General: Skin is warm and dry.     Comments: No rash  Neurological:     Mental Status: He is alert and oriented to person, place, and time.     Comments: AAOx3, moving arms and legs well, no tremors or seizure activity     ED Results / Procedures / Treatments   Labs (all labs ordered are listed, but only abnormal results are displayed) Labs Reviewed  CBC - Abnormal; Notable for the following components:      Result Value   WBC 18.8 (*)    Hemoglobin 12.9 (*)    Platelets 51 (*)    All other components within normal limits  BASIC METABOLIC PANEL - Abnormal; Notable for the following components:   Sodium 127 (*)    Chloride 90 (*)    Glucose, Bld 207 (*)    BUN 24 (*)    Calcium 8.4 (*)    All other components within normal limits  URINALYSIS, ROUTINE W REFLEX MICROSCOPIC - Abnormal; Notable for the following components:   Color, Urine AMBER (*)    Hgb urine dipstick MODERATE (*)    Leukocytes,Ua TRACE (*)    Bacteria, UA FEW (*)    All other components within normal limits  CULTURE, BLOOD (ROUTINE X 2)  CULTURE, BLOOD (ROUTINE X 2)    EKG None  Radiology No results found.  Procedures Procedures (including critical care time)  Medications Ordered in ED Medications  naproxen (NAPROSYN) tablet 500 mg (500 mg  Oral Given 03/06/20 0418)  methocarbamol (ROBAXIN) tablet 500 mg (500 mg Oral Given 03/06/20 0417)  ondansetron (ZOFRAN-ODT) disintegrating tablet 4 mg (4 mg Oral Given 03/06/20 0418)  dicyclomine (BENTYL) capsule 10 mg (  10 mg Oral Given 03/06/20 0417)    ED Course  I have reviewed the triage vital signs and the nursing notes.  Pertinent labs & imaging results that were available during my care of the patient were reviewed by me and considered in my medical decision making (see chart for details).    MDM Rules/Calculators/A&P  44 year old male presenting to the ED with concern of withdrawing from heroin.  States he quit using cold [place] 2 days ago.  His last use was from a "dirty needle".  He expresses some concern for "blood infection".  He has afebrile, nontoxic, no acute distress.  He does not appear diaphoretic and is hemodynamically stable.  He does have enlarged vein of left dorsal hand but no evidence of cellulitis.  He does not have any hemorrhages of the fingertips.  He does not have any murmur noted on exam.  Labs with leukocytosis, this may be reactive.  He is not have any clinical signs of sepsis or significant withdrawal requiring hospitalization at this time.  He was given medications for symptomatic control with good improvement.  He is tolerating oral fluids without difficulty.  Blood cultures were sent, advised he will be notified if these are positive.  He was given outpatient resources for detox centers that he may follow-up with along with prescriptions to help with any withdrawal symptoms.  He may return here for any new/acute changes.  Final Clinical Impression(s) / ED Diagnoses Final diagnoses:  Heroin abuse (HCC)    Rx / DC Orders ED Discharge Orders         Ordered    dicyclomine (BENTYL) 20 MG tablet  2 times daily        03/06/20 0546    naproxen (NAPROSYN) 500 MG tablet  2 times daily with meals        03/06/20 0546    methocarbamol (ROBAXIN) 500 MG tablet  2 times  daily        03/06/20 0546    ondansetron (ZOFRAN ODT) 4 MG disintegrating tablet  Every 8 hours PRN        03/06/20 0546           Garlon Hatchet, PA-C 03/06/20 0602    Gilda Crease, MD 03/07/20 0630

## 2020-03-08 LAB — BLOOD CULTURE ID PANEL (REFLEXED) - BCID2

## 2020-03-08 NOTE — ED Provider Notes (Signed)
Received the blood culture callback.  Patient had gram-negative rods that were positive for Serratia.  Patient never had a fever in the ED was only found in 1 out of 4 bottles likely this most likely contaminant.   Melene Plan, DO 03/08/20 1907

## 2020-03-09 LAB — BLOOD CULTURE ID PANEL (REFLEXED) - BCID2

## 2020-03-10 LAB — CULTURE, BLOOD (ROUTINE X 2)
Special Requests: ADEQUATE
Special Requests: ADEQUATE

## 2020-03-11 ENCOUNTER — Inpatient Hospital Stay (HOSPITAL_COMMUNITY)
Admission: EM | Admit: 2020-03-11 | Discharge: 2020-04-22 | DRG: 853 | Disposition: A | Discharge status: Home / Self Care | Payer: Self-pay | Attending: Internal Medicine | Admitting: Internal Medicine

## 2020-03-11 ENCOUNTER — Other Ambulatory Visit: Payer: Self-pay

## 2020-03-11 ENCOUNTER — Encounter (HOSPITAL_COMMUNITY): Payer: Self-pay | Admitting: *Deleted

## 2020-03-11 ENCOUNTER — Telehealth: Payer: Self-pay | Admitting: Emergency Medicine

## 2020-03-11 ENCOUNTER — Emergency Department (HOSPITAL_COMMUNITY): Payer: Self-pay

## 2020-03-11 ENCOUNTER — Telehealth: Payer: Self-pay | Admitting: *Deleted

## 2020-03-11 ENCOUNTER — Telehealth (HOSPITAL_COMMUNITY): Payer: Self-pay | Admitting: Physician Assistant

## 2020-03-11 DIAGNOSIS — G4700 Insomnia, unspecified: Secondary | ICD-10-CM | POA: Diagnosis present

## 2020-03-11 DIAGNOSIS — Y92230 Patient room in hospital as the place of occurrence of the external cause: Secondary | ICD-10-CM | POA: Diagnosis not present

## 2020-03-11 DIAGNOSIS — F1920 Other psychoactive substance dependence, uncomplicated: Secondary | ICD-10-CM | POA: Diagnosis present

## 2020-03-11 DIAGNOSIS — D696 Thrombocytopenia, unspecified: Secondary | ICD-10-CM | POA: Diagnosis present

## 2020-03-11 DIAGNOSIS — Z885 Allergy status to narcotic agent status: Secondary | ICD-10-CM

## 2020-03-11 DIAGNOSIS — L8990 Pressure ulcer of unspecified site, unspecified stage: Secondary | ICD-10-CM | POA: Insufficient documentation

## 2020-03-11 DIAGNOSIS — Z8679 Personal history of other diseases of the circulatory system: Secondary | ICD-10-CM | POA: Diagnosis present

## 2020-03-11 DIAGNOSIS — M00011 Staphylococcal arthritis, right shoulder: Secondary | ICD-10-CM | POA: Diagnosis present

## 2020-03-11 DIAGNOSIS — J189 Pneumonia, unspecified organism: Secondary | ICD-10-CM | POA: Diagnosis present

## 2020-03-11 DIAGNOSIS — A4101 Sepsis due to Methicillin susceptible Staphylococcus aureus: Secondary | ICD-10-CM

## 2020-03-11 DIAGNOSIS — F319 Bipolar disorder, unspecified: Secondary | ICD-10-CM | POA: Diagnosis present

## 2020-03-11 DIAGNOSIS — T82898A Other specified complication of vascular prosthetic devices, implants and grafts, initial encounter: Secondary | ICD-10-CM | POA: Diagnosis not present

## 2020-03-11 DIAGNOSIS — I330 Acute and subacute infective endocarditis: Secondary | ICD-10-CM | POA: Diagnosis present

## 2020-03-11 DIAGNOSIS — D649 Anemia, unspecified: Secondary | ICD-10-CM | POA: Diagnosis present

## 2020-03-11 DIAGNOSIS — A4153 Sepsis due to Serratia: Principal | ICD-10-CM | POA: Diagnosis present

## 2020-03-11 DIAGNOSIS — F1120 Opioid dependence, uncomplicated: Secondary | ICD-10-CM | POA: Diagnosis present

## 2020-03-11 DIAGNOSIS — A4102 Sepsis due to Methicillin resistant Staphylococcus aureus: Secondary | ICD-10-CM

## 2020-03-11 DIAGNOSIS — B182 Chronic viral hepatitis C: Secondary | ICD-10-CM | POA: Diagnosis present

## 2020-03-11 DIAGNOSIS — L89152 Pressure ulcer of sacral region, stage 2: Secondary | ICD-10-CM | POA: Diagnosis present

## 2020-03-11 DIAGNOSIS — E876 Hypokalemia: Secondary | ICD-10-CM | POA: Diagnosis present

## 2020-03-11 DIAGNOSIS — D6959 Other secondary thrombocytopenia: Secondary | ICD-10-CM | POA: Diagnosis present

## 2020-03-11 DIAGNOSIS — I2690 Septic pulmonary embolism without acute cor pulmonale: Secondary | ICD-10-CM | POA: Diagnosis present

## 2020-03-11 DIAGNOSIS — E875 Hyperkalemia: Secondary | ICD-10-CM | POA: Diagnosis not present

## 2020-03-11 DIAGNOSIS — Z419 Encounter for procedure for purposes other than remedying health state, unspecified: Secondary | ICD-10-CM

## 2020-03-11 DIAGNOSIS — B9562 Methicillin resistant Staphylococcus aureus infection as the cause of diseases classified elsewhere: Secondary | ICD-10-CM | POA: Diagnosis present

## 2020-03-11 DIAGNOSIS — M545 Low back pain, unspecified: Secondary | ICD-10-CM | POA: Diagnosis present

## 2020-03-11 DIAGNOSIS — I079 Rheumatic tricuspid valve disease, unspecified: Secondary | ICD-10-CM | POA: Diagnosis present

## 2020-03-11 DIAGNOSIS — Y711 Therapeutic (nonsurgical) and rehabilitative cardiovascular devices associated with adverse incidents: Secondary | ICD-10-CM | POA: Diagnosis not present

## 2020-03-11 DIAGNOSIS — F1110 Opioid abuse, uncomplicated: Secondary | ICD-10-CM

## 2020-03-11 DIAGNOSIS — E871 Hypo-osmolality and hyponatremia: Secondary | ICD-10-CM | POA: Diagnosis present

## 2020-03-11 DIAGNOSIS — R7881 Bacteremia: Secondary | ICD-10-CM | POA: Diagnosis present

## 2020-03-11 DIAGNOSIS — B1920 Unspecified viral hepatitis C without hepatic coma: Secondary | ICD-10-CM | POA: Diagnosis present

## 2020-03-11 DIAGNOSIS — F17210 Nicotine dependence, cigarettes, uncomplicated: Secondary | ICD-10-CM | POA: Diagnosis present

## 2020-03-11 DIAGNOSIS — Z452 Encounter for adjustment and management of vascular access device: Secondary | ICD-10-CM

## 2020-03-11 DIAGNOSIS — M009 Pyogenic arthritis, unspecified: Secondary | ICD-10-CM | POA: Diagnosis present

## 2020-03-11 DIAGNOSIS — F1990 Other psychoactive substance use, unspecified, uncomplicated: Secondary | ICD-10-CM | POA: Diagnosis present

## 2020-03-11 DIAGNOSIS — L89510 Pressure ulcer of right ankle, unstageable: Secondary | ICD-10-CM | POA: Diagnosis not present

## 2020-03-11 DIAGNOSIS — Z20822 Contact with and (suspected) exposure to covid-19: Secondary | ICD-10-CM | POA: Diagnosis present

## 2020-03-11 DIAGNOSIS — D509 Iron deficiency anemia, unspecified: Secondary | ICD-10-CM | POA: Diagnosis present

## 2020-03-11 DIAGNOSIS — F1123 Opioid dependence with withdrawal: Secondary | ICD-10-CM | POA: Diagnosis present

## 2020-03-11 DIAGNOSIS — R6520 Severe sepsis without septic shock: Secondary | ICD-10-CM | POA: Diagnosis present

## 2020-03-11 DIAGNOSIS — Z23 Encounter for immunization: Secondary | ICD-10-CM

## 2020-03-11 LAB — URINALYSIS, ROUTINE W REFLEX MICROSCOPIC
Bilirubin Urine: NEGATIVE
Glucose, UA: NEGATIVE mg/dL
Hgb urine dipstick: NEGATIVE
Ketones, ur: NEGATIVE mg/dL
Leukocytes,Ua: NEGATIVE
Nitrite: NEGATIVE
Protein, ur: 30 mg/dL — AB
Specific Gravity, Urine: 1.018 (ref 1.005–1.030)
pH: 5 (ref 5.0–8.0)

## 2020-03-11 LAB — COMPREHENSIVE METABOLIC PANEL
ALT: 32 U/L (ref 0–44)
AST: 67 U/L — ABNORMAL HIGH (ref 15–41)
Albumin: 1.7 g/dL — ABNORMAL LOW (ref 3.5–5.0)
Alkaline Phosphatase: 76 U/L (ref 38–126)
Anion gap: 9 (ref 5–15)
BUN: 21 mg/dL — ABNORMAL HIGH (ref 6–20)
CO2: 24 mmol/L (ref 22–32)
Calcium: 7.9 mg/dL — ABNORMAL LOW (ref 8.9–10.3)
Chloride: 101 mmol/L (ref 98–111)
Creatinine, Ser: 0.81 mg/dL (ref 0.61–1.24)
GFR calc Af Amer: >60 mL/min (ref >60)
GFR calc non Af Amer: >60 mL/min (ref >60)
Glucose, Bld: 141 mg/dL — ABNORMAL HIGH (ref 70–99)
Potassium: 3.1 mmol/L — ABNORMAL LOW (ref 3.5–5.1)
Sodium: 134 mmol/L — ABNORMAL LOW (ref 135–145)
Total Bilirubin: 1.5 mg/dL — ABNORMAL HIGH (ref 0.3–1.2)
Total Protein: 6.5 g/dL (ref 6.5–8.1)

## 2020-03-11 LAB — CBC WITH DIFFERENTIAL/PLATELET
Abs Immature Granulocytes: 0.14 10*3/uL — ABNORMAL HIGH (ref 0.00–0.07)
Basophils Absolute: 0 10*3/uL (ref 0.0–0.1)
Basophils Relative: 0 %
Eosinophils Absolute: 0 10*3/uL (ref 0.0–0.5)
Eosinophils Relative: 0 %
HCT: 31.8 % — ABNORMAL LOW (ref 39.0–52.0)
Hemoglobin: 10.7 g/dL — ABNORMAL LOW (ref 13.0–17.0)
Immature Granulocytes: 1 %
Lymphocytes Relative: 6 %
Lymphs Abs: 0.8 10*3/uL (ref 0.7–4.0)
MCH: 28.2 pg (ref 26.0–34.0)
MCHC: 33.6 g/dL (ref 30.0–36.0)
MCV: 83.7 fL (ref 80.0–100.0)
Monocytes Absolute: 0.3 10*3/uL (ref 0.1–1.0)
Monocytes Relative: 2 %
Neutro Abs: 12.5 10*3/uL — ABNORMAL HIGH (ref 1.7–7.7)
Neutrophils Relative %: 91 %
Platelets: 32 10*3/uL — ABNORMAL LOW (ref 150–400)
RBC: 3.8 MIL/uL — ABNORMAL LOW (ref 4.22–5.81)
RDW: 14.2 % (ref 11.5–15.5)
WBC: 13.7 10*3/uL — ABNORMAL HIGH (ref 4.0–10.5)
nRBC: 0 % (ref 0.0–0.2)

## 2020-03-11 LAB — MAGNESIUM: Magnesium: 2 mg/dL (ref 1.7–2.4)

## 2020-03-11 LAB — LACTIC ACID, PLASMA
Lactic Acid, Venous: 2 mmol/L (ref 0.5–1.9)
Lactic Acid, Venous: 2.5 mmol/L (ref 0.5–1.9)
Lactic Acid, Venous: 2 mmol/L (ref 0.5–1.9)

## 2020-03-11 LAB — PROTIME-INR
INR: 1.5 — ABNORMAL HIGH (ref 0.8–1.2)
Prothrombin Time: 17.7 seconds — ABNORMAL HIGH (ref 11.4–15.2)

## 2020-03-11 LAB — APTT: aPTT: 32 seconds (ref 24–36)

## 2020-03-11 LAB — SARS CORONAVIRUS 2 BY RT PCR (HOSPITAL ORDER, PERFORMED IN ~~LOC~~ HOSPITAL LAB): SARS Coronavirus 2: NEGATIVE

## 2020-03-11 MED ORDER — SODIUM CHLORIDE 0.9 % IV SOLN: 2.0000 g | INTRAVENOUS | Status: DC

## 2020-03-11 MED ORDER — BUPRENORPHINE HCL-NALOXONE HCL 8-2 MG SL SUBL
1.0000 | SUBLINGUAL_TABLET | Freq: Two times a day (BID) | SUBLINGUAL | Status: DC
  Administered 2020-03-12 – 2020-04-22 (×83): 1 via SUBLINGUAL
  Filled 2020-03-11 (×83): qty 1

## 2020-03-11 MED ORDER — LORAZEPAM 2 MG/ML IJ SOLN
1.0000 mg | Freq: Once | INTRAMUSCULAR | Status: AC
  Administered 2020-03-11: 1 mg via INTRAVENOUS
  Filled 2020-03-11: qty 1

## 2020-03-11 MED ORDER — SODIUM CHLORIDE 0.9 % IV SOLN
2.0000 g | Freq: Once | INTRAVENOUS | Status: AC
  Administered 2020-03-11: 2 g via INTRAVENOUS
  Filled 2020-03-11: qty 20

## 2020-03-11 MED ORDER — LACTATED RINGERS IV BOLUS (SEPSIS)
1000.0000 mL | Freq: Once | INTRAVENOUS | Status: AC
  Administered 2020-03-11: 1000 mL via INTRAVENOUS

## 2020-03-11 MED ORDER — IOHEXOL 350 MG/ML SOLN
100.0000 mL | Freq: Once | INTRAVENOUS | Status: AC | PRN
  Administered 2020-03-11: 100 mL via INTRAVENOUS

## 2020-03-11 MED ORDER — LACTATED RINGERS IV BOLUS (SEPSIS): 250.0000 mL | Freq: Once | INTRAVENOUS | Status: DC

## 2020-03-11 MED ORDER — ACETAMINOPHEN 500 MG PO TABS
1000.0000 mg | ORAL_TABLET | Freq: Four times a day (QID) | ORAL | Status: DC | PRN
  Administered 2020-03-14 – 2020-03-30 (×4): 1000 mg via ORAL
  Filled 2020-03-11 (×4): qty 2

## 2020-03-11 MED ORDER — VANCOMYCIN HCL 1500 MG/300ML IV SOLN
1500.0000 mg | Freq: Once | INTRAVENOUS | Status: AC
  Administered 2020-03-11: 1500 mg via INTRAVENOUS
  Filled 2020-03-11: qty 300

## 2020-03-11 MED ORDER — ONDANSETRON HCL 4 MG/2ML IJ SOLN: 4.0000 mg | Freq: Four times a day (QID) | INTRAMUSCULAR | Status: DC | PRN

## 2020-03-11 MED ORDER — SODIUM CHLORIDE 0.9% FLUSH
3.0000 mL | Freq: Two times a day (BID) | INTRAVENOUS | Status: DC
  Administered 2020-03-11 – 2020-03-19 (×10): 3 mL via INTRAVENOUS

## 2020-03-11 MED ORDER — LACTATED RINGERS IV SOLN: INTRAVENOUS | Status: DC

## 2020-03-11 MED ORDER — ACETAMINOPHEN 650 MG RE SUPP: 650.0000 mg | Freq: Four times a day (QID) | RECTAL | Status: DC | PRN

## 2020-03-11 MED ORDER — HYDROXYZINE HCL 25 MG PO TABS
25.0000 mg | ORAL_TABLET | Freq: Four times a day (QID) | ORAL | Status: DC | PRN
  Administered 2020-03-12: 25 mg via ORAL
  Filled 2020-03-11 (×2): qty 1

## 2020-03-11 MED ORDER — ONDANSETRON HCL 4 MG PO TABS
4.0000 mg | ORAL_TABLET | Freq: Four times a day (QID) | ORAL | Status: DC | PRN
  Administered 2020-04-12: 4 mg via ORAL
  Filled 2020-03-11: qty 1

## 2020-03-11 MED ORDER — BUPRENORPHINE HCL-NALOXONE HCL 8-2 MG SL SUBL: 1.0000 | SUBLINGUAL_TABLET | Freq: Two times a day (BID) | SUBLINGUAL | Status: DC

## 2020-03-11 MED ORDER — POTASSIUM CHLORIDE CRYS ER 20 MEQ PO TBCR
40.0000 meq | EXTENDED_RELEASE_TABLET | Freq: Once | ORAL | Status: AC
  Administered 2020-03-11: 40 meq via ORAL
  Filled 2020-03-11: qty 2

## 2020-03-11 MED ORDER — LACTATED RINGERS IV SOLN: INTRAVENOUS | Status: AC

## 2020-03-11 MED ORDER — LOPERAMIDE HCL 2 MG PO CAPS: 2.0000 mg | ORAL_CAPSULE | ORAL | Status: DC | PRN

## 2020-03-11 MED ORDER — BUPRENORPHINE HCL-NALOXONE HCL 2-0.5 MG SL SUBL
2.0000 | SUBLINGUAL_TABLET | SUBLINGUAL | Status: AC | PRN
  Administered 2020-03-11 – 2020-03-12 (×2): 2 via SUBLINGUAL
  Filled 2020-03-11 (×2): qty 2

## 2020-03-11 MED ORDER — VANCOMYCIN HCL IN DEXTROSE 1-5 GM/200ML-% IV SOLN
1000.0000 mg | Freq: Three times a day (TID) | INTRAVENOUS | Status: DC
  Administered 2020-03-11 – 2020-03-19 (×23): 1000 mg via INTRAVENOUS
  Filled 2020-03-11 (×23): qty 200

## 2020-03-11 NOTE — ED Notes (Signed)
Pt aware of need for urine sample.  

## 2020-03-11 NOTE — ED Notes (Signed)
Attempted second set of blood cultures but unable to obtain.

## 2020-03-11 NOTE — ED Notes (Signed)
Lactic 2.0, PA notified.

## 2020-03-11 NOTE — Telephone Encounter (Signed)
Post ED Visit - Positive Culture Follow-up  Culture report reviewed by antimicrobial stewardship pharmacist: [facility] []  , Pharm.D. []  Enzo Bi, Pharm.D., BCPS AQ-ID []  , Pharm.D., BCPS []  Celedonio Miyamoto, Pharm.D., BCPS []  [place], Garvin Fila.D., BCPS, AAHIVP []  , Pharm.D., BCPS, AAHIVP []  Georgina Pillion, PharmD, BCPS []  , PharmD, BCPS []  [place], PharmD, BCPS []  [address], PharmD []  , PharmD, BCPS []  Estella Husk, PharmD PharmD  [facility] []  , PharmD []  Phillips Climes, PharmD []  , PharmD []  Agapito Games, Rph []  ) Verlan Friends, PharmD []  , PharmD []  Mervyn Gay, PharmD []  , PharmD []  Vinnie Level, PharmD []  Sharen Hones, PharmD []  [facility], PharmD []  , PharmD []  Len Childs, PharmD   Positive blood culture Patient has been contacted by the ED PA And advised to go to the [facility],no further patient follow-up is required at this time.  03/11/2020, 11:52 AM

## 2020-03-11 NOTE — ED Provider Notes (Addendum)
[facility] Note   CSN: [id number] Arrival date & time: 03/11/20  1448     History No chief complaint on file.   Robert Kramer is a 44 y.o. male with PMH significant for polysubstance abuse who presents to the ED with complaints of blood infection and inability to walk.  I reviewed patient's medical record and he has been evaluated on multiple occasions in the past couple of weeks for heroin abuse and withdrawal symptoms.  During patient's recent encounter on 03/06/2020, he had a leukocytosis of 18.8, but was hemodynamically stable and afebrile.  Patient was subsequently discharged home with a course of antibiotics in addition to analgesics and loperamide.  He was notified today by pharmacist that his blood culture grew staph aureus and Serratia marcescens and encouraged to come to the emergency department.  On my examination, patient states that he has been having fevers at home.  I asked him how high and he simply responds by saying that he is "burning".  He states that they are worse at night.  He is afebrile here in the ED.  He states that he did not fill any of the prescribed medications from last ED encounter.  I asked patient why it was noted that he cannot walk and he states that he feels weak all over.  He also is endorsing generalized body aches.  He denies any focal areas of tenderness or pain.  He states that he is simply worried because he knows he has a blood infection from using dirty needles.  Patient denies any chest pain, back pain, difficulty breathing, abdominal pain, nausea or vomiting, cough, or other symptoms.  While patient was evaluated on 03/06/2020 for withdrawal symptoms, he states that he has since continued to use heroin.  He states that is because he was not given anything from the ED.  I informed them that he was in fact prescribed medications, but he counters that he was to "messed up" to remember that.  He understands that he  needs intensive rehabilitation.  He has been using heroin on a daily basis for over 6 years.  HPI     Past Medical History:  Diagnosis Date  . Anxiety   . Bipolar 1 disorder (HCC)   . DDD (degenerative disc disease), lumbar   . Depression   . Drug-seeking behavior   . Hallux valgus with bunions   . Mood swings   . Polysubstance abuse [facility])     Patient Active Problem List   Diagnosis Date Noted  . Polysubstance dependence (HCC) 04/15/2013  . Drug-seeking behavior 04/15/2013    Past Surgical History:  Procedure Laterality Date  . 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 Cancer Father     Social History   Tobacco Use  . Smoking status: Current Every Day Smoker    Packs/day: 0.50    Years: 5.00    Pack years: 2.50    Types: Cigarettes  . Smokeless tobacco: Never Used  Substance Use Topics  . Alcohol use: Yes    Alcohol/week: 1.0 standard drink    Types: 1 Cans of beer per week    Comment: occ  . Drug use: No    Types: Cocaine, Marijuana    Comment: opiates - quit 2017    Home Medications Prior to Admission medications   Medication Sig Start Date End Date Taking? Authorizing  Provider  acetaminophen (TYLENOL) 500 MG tablet Take 500 mg by mouth every 6 (six) hours as needed for mild pain or moderate pain. Patient not taking: Reported on 03/11/2020    [provider]  cyclobenzaprine (FLEXERIL) 10 MG tablet Take 1 tablet (10 mg total) by mouth 3 (three) times daily as needed. Patient not taking: Reported on 03/11/2020 01/14/15   Triplett, Tammy, PA-C  dicyclomine (BENTYL) 20 MG tablet Take 1 tablet (20 mg total) by mouth 2 (two) times daily. Patient not taking: Reported on 03/11/2020 03/06/20   Caccavale, Sophia, PA-C  hydrOXYzine (ATARAX/VISTARIL) 25 MG tablet Take 1 tablet (25 mg total) by mouth every 8 (eight) hours as needed. Patient not taking: Reported on 03/11/2020 03/06/20   Caccavale, Sophia, PA-C    ibuprofen (ADVIL,MOTRIN) 800 MG tablet Take 1 tablet (800 mg total) by mouth 3 (three) times daily. Patient not taking: Reported on 03/11/2020 01/14/15   Triplett, Tammy, PA-C  ketoconazole (NIZORAL) 2 % cream Apply 1 application topically 2 (two) times daily. Patient not taking: Reported on 03/11/2020 10/23/16   Emi HolesLaw, Alexandra M, PA-C  loperamide (IMODIUM) 2 MG capsule Take 1 capsule (2 mg total) by mouth 4 (four) times daily as needed for diarrhea or loose stools. Patient not taking: Reported on 03/11/2020 03/06/20   Caccavale, Sophia, PA-C  methocarbamol (ROBAXIN) 500 MG tablet Take 1 tablet (500 mg total) by mouth 2 (two) times daily. Patient not taking: Reported on 03/11/2020 03/06/20   Caccavale, Sophia, PA-C  naproxen (NAPROSYN) 500 MG tablet Take 1 tablet (500 mg total) by mouth 2 (two) times daily with a meal. Patient not taking: Reported on 03/11/2020 03/06/20   Caccavale, Sophia, PA-C  ondansetron (ZOFRAN ODT) 4 MG disintegrating tablet Take 1 tablet (4 mg total) by mouth every 8 (eight) hours as needed for nausea. Patient not taking: Reported on 03/11/2020 03/06/20   Caccavale, Sophia, PA-C  oxyCODONE-acetaminophen (PERCOCET/ROXICET) 5-325 MG per tablet Take 1 tablet by mouth every 4 (four) hours as needed. Patient not taking: Reported on 03/11/2020 02/12/15   Garlon HatchetSanders, Lisa M, PA-C    Allergies    Tramadol  Review of Systems   Review of Systems  All other systems reviewed and are negative.   Physical Exam Updated Vital Signs BP 112/69   Pulse 99   Temp 98.1 F (36.7 C) (Oral)   Resp (!) 28   Ht 5\' 11"  (1.803 m)   Wt 70.8 kg   SpO2 100%   BMI 21.76 kg/m   Physical Exam Vitals and nursing note reviewed. Exam conducted with a chaperone present.  Constitutional:      Appearance: He is ill-appearing.  HENT:     Head: Normocephalic and atraumatic.     Mouth/Throat:     Mouth: Mucous membranes are dry.  Eyes:     General: No scleral icterus.    Conjunctiva/sclera: Conjunctivae normal.   Cardiovascular:     Rate and Rhythm: Regular rhythm. Tachycardia present.     Pulses: Normal pulses.     Heart sounds: Normal heart sounds.  Pulmonary:     Effort: Pulmonary effort is normal. No respiratory distress.     Breath sounds: Normal breath sounds. No wheezing or rales.  Abdominal:     General: Abdomen is flat. There is no distension.     Palpations: Abdomen is soft.     Tenderness: There is no abdominal tenderness.  Musculoskeletal:     Cervical back: Normal range of motion. No rigidity.  Right lower leg: No edema.     Left lower leg: No edema.     Comments: No midline spinal TTP, swelling, warmth, or other overlying skin changes.  Skin:    General: Skin is dry.     Capillary Refill: Capillary refill takes less than 2 seconds.  Neurological:     Mental Status: He is alert and oriented to person, place, and time.     GCS: GCS eye subscore is 4. GCS verbal subscore is 5. GCS motor subscore is 6.  Psychiatric:        Mood and Affect: Mood normal.        Behavior: Behavior normal.        Thought Content: Thought content normal.     ED Results / Procedures / Treatments   Labs (all labs ordered are listed, but only abnormal results are displayed) Labs Reviewed  LACTIC ACID, PLASMA - Abnormal; Notable for the following components:      Result Value   Lactic Acid, Venous 2.0 (*)    All other components within normal limits  COMPREHENSIVE METABOLIC PANEL - Abnormal; Notable for the following components:   Sodium 134 (*)    Potassium 3.1 (*)    Glucose, Bld 141 (*)    BUN 21 (*)    Calcium 7.9 (*)    Albumin 1.7 (*)    AST 67 (*)    Total Bilirubin 1.5 (*)    All other components within normal limits  CBC WITH DIFFERENTIAL/PLATELET - Abnormal; Notable for the following components:   WBC 13.7 (*)    RBC 3.80 (*)    Hemoglobin 10.7 (*)    HCT 31.8 (*)    Platelets 32 (*)    Neutro Abs 12.5 (*)    Abs Immature Granulocytes 0.14 (*)    All other components  within normal limits  PROTIME-INR - Abnormal; Notable for the following components:   Prothrombin Time 17.7 (*)    INR 1.5 (*)    All other components within normal limits  URINALYSIS, ROUTINE W REFLEX MICROSCOPIC - Abnormal; Notable for the following components:   Color, Urine AMBER (*)    APPearance HAZY (*)    Protein, ur 30 (*)    Bacteria, UA RARE (*)    All other components within normal limits  SARS CORONAVIRUS 2 BY RT PCR (HOSPITAL ORDER, PERFORMED IN [facility])  CULTURE, BLOOD (ROUTINE X 2)  CULTURE, BLOOD (ROUTINE X 2)  URINE CULTURE  APTT  LACTIC ACID, PLASMA    EKG EKG Interpretation  Date/Time:  Monday March 11 2020 15:40:50 EDT Ventricular Rate:  121 PR Interval:    QRS Duration: 93 QT Interval:  361 QTC Calculation: 513 R Axis:   -35 Text Interpretation: Sinus or ectopic atrial tachycardia Probable left atrial enlargement Nonspecific T abnormalities, lateral leads Confirmed by Virgina Norfolk [phone]) on 03/11/2020 6:15:17 PM   Radiology CT Angio Chest PE W/Cm &/Or Wo Cm  Result Date: 03/11/2020 CLINICAL DATA:  Sepsis. Diffuse body pain. Concern for septic emboli on a portable chest earlier today. EXAM: CT ANGIOGRAPHY CHEST WITH CONTRAST TECHNIQUE: Multidetector CT imaging of the chest was performed using the standard protocol during bolus administration of intravenous contrast. Multiplanar CT image reconstructions and MIPs were obtained to evaluate the vascular anatomy. CONTRAST:  OMNIPAQUE IOHEXOL 350 MG/ML SOLN COMPARISON:  Portable chest obtained earlier today. Chest, abdomen and pelvis CT dated 04/20/2014. FINDINGS: Cardiovascular: Satisfactory opacification of the pulmonary arteries to the  segmental level. No evidence of pulmonary embolism. Normal heart size. No pericardial effusion. Mediastinum/Nodes: Mildly enlarged bilateral hilar lymph nodes. These include a 15 mm short axis node on the right on image number 68 series 5 and 13 mm short  axis node on the left on image number 74 series 5. No enlarged mediastinal or axillary nodes. Unremarkable thyroid gland. Unremarkable esophagus. Lungs/Pleura: Multiple cavitary nodules of varying sizes throughout both lungs. Some of these contain air-fluid levels. There are also multiple non cavitary nodules in both lungs with poorly defined margins. Also demonstrated is patchy airspace opacity in the posterior aspects of both lower lobes, greater on the right. No pleural fluid. Upper Abdomen: There is a suggestion of hepatosplenomegaly, neither included in their entirety. Musculoskeletal: Interval approximately 20% T6 superior endplate compression deformity with mild Schmorl's node formation. No acute fracture lines or bony retropulsion. Review of the MIP images confirms the above findings. IMPRESSION: 1. No bland pulmonary emboli. 2. Multiple cavitary and non cavitary nodules of varying sizes throughout both lungs, compatible with septic emboli. 3. Bilateral lower lobe pneumonia. 4. Mild bilateral hilar adenopathy, most likely reactive. 5. Probable hepatosplenomegaly, neither included in their entirety. Electronically Signed   By: Beckie Salts M.D.   On: 03/11/2020 18:38   DG Chest Port 1 View  Result Date: 03/11/2020 CLINICAL DATA:  Fever.  Potential sepsis. EXAM: PORTABLE CHEST 1 VIEW COMPARISON:  07/12/2014 FINDINGS: Cardiac silhouette is normal in size. Normal mediastinal and hilar contours. Patchy areas of hazy airspace opacity mid and lower lungs, with a possible area of cavitation associated with opacities in the right upper lobe above the minor fissure. There is no evidence of pulmonary edema. No pleural effusion or pneumothorax. Skeletal structures are grossly intact. IMPRESSION: 1. Multiple areas of hazy airspace opacity in the mid to lower lungs, with some apparent cavitation in the inferior right upper lobe. Findings may reflect multiple septic emboli. Multifocal pneumonia is also in the  differential diagnosis including atypical/viral etiologies. Electronically Signed   By: Amie Portland M.D.   On: 03/11/2020 16:34    Procedures .Critical Care Performed by: Lorelee New, PA-C Authorized by: Lorelee New, PA-C   Critical care provider statement:    Critical care time (minutes):  45   Critical care was necessary to treat or prevent imminent or life-threatening deterioration of the following conditions:  Sepsis   Critical care was time spent personally by me on the following activities:  Discussions with consultants, evaluation of patient's response to treatment, examination of patient, ordering and performing treatments and interventions, ordering and review of laboratory studies, ordering and review of radiographic studies, pulse oximetry, re-evaluation of patient's condition, obtaining history from patient or surrogate and review of old charts Comments:     Septic emboli, positive blood cultures.   (including critical care time)  Medications Ordered in ED Medications  lactated ringers infusion ( Intravenous New Bag/Given 03/11/20 1616)  lactated ringers bolus 1,000 mL (0 mLs Intravenous Stopped 03/11/20 1733)    And  lactated ringers bolus 1,000 mL (1,000 mLs Intravenous New Bag/Given 03/11/20 1745)    And  lactated ringers bolus 250 mL (has no administration in time range)  vancomycin (VANCOCIN) IVPB 1000 mg/200 mL premix (has no administration in time range)  cefTRIAXone (ROCEPHIN) 2 g in sodium chloride 0.9 % 100 mL IVPB (has no administration in time range)  vancomycin (VANCOREADY) IVPB 1500 mg/300 mL (0 mg Intravenous Stopped 03/11/20 1819)  potassium chloride SA (KLOR-CON) CR tablet  40 mEq (40 mEq Oral Given 03/11/20 1746)  LORazepam (ATIVAN) injection 1 mg (1 mg Intravenous Given 03/11/20 1741)  iohexol (OMNIPAQUE) 350 MG/ML injection 100 mL (100 mLs Intravenous Contrast Given 03/11/20 1806)    ED Course  I have reviewed the triage vital signs and the nursing  notes.  Pertinent labs & imaging results that were available during my care of the patient were reviewed by me and considered in my medical decision making (see chart for details).  Clinical Course as of Mar 11 1946  Mon Mar 11, 2020  1946 Spoke with Dr. Allena Katz who will see and admit patient.   [GG]    Clinical Course User Index [GG] Lorelee New, PA-C   MDM Rules/Calculators/A&P                          Patient has mildly elevated lactic acid of 2.0, likely combination of his infection and his dehydration.  He is feeling improved after administration of IVF and IV vancomycin.  Single set of blood culture was obtained, RN reports that she was unable to obtain assessment prior to administration of antibiotics.  His leukocytosis is actually improved from 18.8 and labs obtained 6 days ago to 13.7 today.  Patient was started on vancomycin here in the ED.  Spoke with pharmacist who also recommended Rocephin to cover for Serratia.  Patient states that he typically injects in his left forearm.  No obvious evidence of induration or erythema concerning for cellulitis or other skin/soft tissue infection.  DG chest is personally reviewed and demonstrates multiple areas of hazy airspace opacities in the mid to lower lungs and some cavitation in the inferior right upper lobe which may reflect septic emboli versus multifocal pneumonia.  We will obtain CTA PE study.  Patient has been persistently tachycardic and tachypneic here in the ED.  Will consult hospitalist for admission as patient will require echocardiogram to evaluate for infective endocarditis.  CTA PE study demonstrates no pulmonary emboli, however multiple cavitary and noncavitary nodules of varying sizes throughout both lungs compatible with septic emboli.  There is also bilateral lower lobe pneumonia evident on CT.  We will consult hospitalist for admission.    Spoke with Dr. Allena Katz who will see and admit patient.   Final Clinical  Impression(s) / ED Diagnoses Final diagnoses:  Heroin abuse (HCC)  Sepsis due to methicillin susceptible Staphylococcus aureus (MSSA) without acute organ dysfunction [facility])    Rx / DC Orders ED Discharge Orders    None       Lorelee New, PA-C 03/11/20 1947    Lorelee New, PA-C 03/11/20 1948    Virgina Norfolk, DO 03/11/20 2120

## 2020-03-11 NOTE — ED Notes (Signed)
Patient transported to CT 

## 2020-03-11 NOTE — Progress Notes (Signed)
ED Antimicrobial Stewardship Positive Culture Follow Up   Robert Kramer is an 44 y.o. male who presented to [facility] on 03/05/2020 with a chief complaint of heroin withdrawal. Last use 2 days prior to ED visit with dirty needle. WBC 18.8, afebrile.   Chief Complaint  Patient presents with  . Withdrawal    Recent Results (from the past 720 hour(s))  Blood culture (routine x 2)     Status: Abnormal   Collection Time: 03/06/20  6:34 AM   Specimen: BLOOD RIGHT FOREARM  Result Value Ref Range Status   Specimen Description BLOOD RIGHT FOREARM  Final   Special Requests   Final    BOTTLES DRAWN AEROBIC AND ANAEROBIC Blood Culture adequate volume   Culture  Setup Time   Final    GRAM NEGATIVE RODS AEROBIC BOTTLE ONLY CRITICAL RESULT CALLED TO, READ BACK BY AND VERIFIED WITH: S COBLE RN 1831 03/08/20 A BROWNING GRAM POSITIVE COCCI IN CLUSTERS ANAEROBIC BOTTLE ONLY CRITICAL VALUE NOTED.  VALUE IS CONSISTENT WITH PREVIOUSLY REPORTED AND CALLED VALUE. Performed at [facility], [address], [place], [place] [address]    Culture SERRATIA MARCESCENS (A)  Final   Report Status 03/10/2020 FINAL  Final   Organism ID, Bacteria SERRATIA MARCESCENS  Final      Susceptibility   Serratia marcescens - MIC*    CEFAZOLIN >=64 RESISTANT Resistant     CEFEPIME <=0.12 SENSITIVE Sensitive     CEFTAZIDIME <=1 SENSITIVE Sensitive     CEFTRIAXONE <=0.25 SENSITIVE Sensitive     CIPROFLOXACIN <=0.25 SENSITIVE Sensitive     GENTAMICIN <=1 SENSITIVE Sensitive     TRIMETH/SULFA <=20 SENSITIVE Sensitive     * SERRATIA MARCESCENS  Blood Culture ID Panel (Reflexed)     Status: Abnormal   Collection Time: 03/06/20  6:34 AM  Result Value Ref Range Status   Enterococcus faecalis NOT DETECTED NOT DETECTED Final   Enterococcus Faecium NOT DETECTED NOT DETECTED Final   Listeria monocytogenes NOT DETECTED NOT DETECTED Final   Staphylococcus species NOT DETECTED NOT DETECTED Final   Staphylococcus aureus  (BCID) NOT DETECTED NOT DETECTED Final   Staphylococcus epidermidis NOT DETECTED NOT DETECTED Final   Staphylococcus lugdunensis NOT DETECTED NOT DETECTED Final   Streptococcus species NOT DETECTED NOT DETECTED Final   Streptococcus agalactiae NOT DETECTED NOT DETECTED Final   Streptococcus pneumoniae NOT DETECTED NOT DETECTED Final   Streptococcus pyogenes NOT DETECTED NOT DETECTED Final   A.calcoaceticus-baumannii NOT DETECTED NOT DETECTED Final   Bacteroides fragilis NOT DETECTED NOT DETECTED Final   Enterobacterales DETECTED (A) NOT DETECTED Final    Comment: Enterobacterales represent a large order of gram negative bacteria, not a single organism. CRITICAL RESULT CALLED TO, READ BACK BY AND VERIFIED WITH: S COBLE RN [phone] 03/08/20 A BROWNING    Enterobacter cloacae complex NOT DETECTED NOT DETECTED Final   Escherichia coli NOT DETECTED NOT DETECTED Final   Klebsiella aerogenes NOT DETECTED NOT DETECTED Final   Klebsiella oxytoca NOT DETECTED NOT DETECTED Final   Klebsiella pneumoniae NOT DETECTED NOT DETECTED Final   Proteus species NOT DETECTED NOT DETECTED Final   Salmonella species NOT DETECTED NOT DETECTED Final   Serratia marcescens DETECTED (A) NOT DETECTED Final    Comment: CRITICAL RESULT CALLED TO, READ BACK BY AND VERIFIED WITH: S COBLE RN 1831 03/08/20 A BROWNING    Haemophilus influenzae NOT DETECTED NOT DETECTED Final   Neisseria meningitidis NOT DETECTED NOT DETECTED Final   Pseudomonas aeruginosa NOT  DETECTED NOT DETECTED Final   Stenotrophomonas maltophilia NOT DETECTED NOT DETECTED Final   Candida albicans NOT DETECTED NOT DETECTED Final   Candida auris NOT DETECTED NOT DETECTED Final   Candida glabrata NOT DETECTED NOT DETECTED Final   Candida krusei NOT DETECTED NOT DETECTED Final   Candida parapsilosis NOT DETECTED NOT DETECTED Final   Candida tropicalis NOT DETECTED NOT DETECTED Final   Cryptococcus neoformans/gattii NOT DETECTED NOT DETECTED Final   CTX-M  ESBL NOT DETECTED NOT DETECTED Final   Carbapenem resistance IMP NOT DETECTED NOT DETECTED Final   Carbapenem resistance KPC NOT DETECTED NOT DETECTED Final   Carbapenem resistance NDM NOT DETECTED NOT DETECTED Final   Carbapenem resist OXA 48 LIKE NOT DETECTED NOT DETECTED Final   Carbapenem resistance VIM NOT DETECTED NOT DETECTED Final    Comment: Performed at [facility], [address], [place], [place] [address]  Blood culture (routine x 2)     Status: Abnormal   Collection Time: 03/06/20  6:44 AM   Specimen: BLOOD  Result Value Ref Range Status   Specimen Description BLOOD RIGHT ANTECUBITAL  Final   Special Requests   Final    BOTTLES DRAWN AEROBIC AND ANAEROBIC Blood Culture adequate volume   Culture  Setup Time   Final    IN BOTH AEROBIC AND ANAEROBIC BOTTLES GRAM POSITIVE COCCI IN CLUSTERS CRITICAL RESULT CALLED TO, READ BACK BY AND VERIFIED WITH: L ADKINS RN 03/09/20 0219 JDW Performed at [facility], [address], [place], [place] [address]    Culture METHICILLIN RESISTANT STAPHYLOCOCCUS AUREUS (A)  Final   Report Status 03/10/2020 FINAL  Final   Organism ID, Bacteria METHICILLIN RESISTANT STAPHYLOCOCCUS AUREUS  Final      Susceptibility   Methicillin resistant staphylococcus aureus - MIC*    CIPROFLOXACIN <=0.5 SENSITIVE Sensitive     ERYTHROMYCIN >=8 RESISTANT Resistant     GENTAMICIN <=0.5 SENSITIVE Sensitive     OXACILLIN >=4 RESISTANT Resistant     TETRACYCLINE <=1 SENSITIVE Sensitive     VANCOMYCIN 1 SENSITIVE Sensitive     TRIMETH/SULFA <=10 SENSITIVE Sensitive     CLINDAMYCIN <=0.25 SENSITIVE Sensitive     RIFAMPIN <=0.5 SENSITIVE Sensitive     Inducible Clindamycin NEGATIVE Sensitive     * METHICILLIN RESISTANT STAPHYLOCOCCUS AUREUS  Blood Culture ID Panel (Reflexed)     Status: Abnormal   Collection Time: 03/06/20  6:44 AM  Result Value Ref Range Status   Enterococcus faecalis NOT DETECTED NOT DETECTED Final   Enterococcus Faecium NOT  DETECTED NOT DETECTED Final   Listeria monocytogenes NOT DETECTED NOT DETECTED Final   Staphylococcus species DETECTED (A) NOT DETECTED Final    Comment: CRITICAL RESULT CALLED TO, READ BACK BY AND VERIFIED WITH: L ADKINS RN 03/09/20 0208 JDW    Staphylococcus aureus (BCID) DETECTED (A) NOT DETECTED Final    Comment: Methicillin (oxacillin)-resistant Staphylococcus aureus (MRSA). MRSA is predictably resistant to beta-lactam antibiotics (except ceftaroline). Preferred therapy is vancomycin unless clinically contraindicated. Patient requires contact precautions if  hospitalized. CRITICAL RESULT CALLED TO, READ BACK BY AND VERIFIED WITH: L ADKINS RN 03/09/20 0208 JDW    Staphylococcus epidermidis NOT DETECTED NOT DETECTED Final   Staphylococcus lugdunensis NOT DETECTED NOT DETECTED Final   Streptococcus species NOT DETECTED NOT DETECTED Final   Streptococcus agalactiae NOT DETECTED NOT DETECTED Final   Streptococcus pneumoniae NOT DETECTED NOT DETECTED Final   Streptococcus pyogenes NOT DETECTED NOT DETECTED Final   A.calcoaceticus-baumannii NOT DETECTED NOT  DETECTED Final   Bacteroides fragilis NOT DETECTED NOT DETECTED Final   Enterobacterales NOT DETECTED NOT DETECTED Final   Enterobacter cloacae complex NOT DETECTED NOT DETECTED Final   Escherichia coli NOT DETECTED NOT DETECTED Final   Klebsiella aerogenes NOT DETECTED NOT DETECTED Final   Klebsiella oxytoca NOT DETECTED NOT DETECTED Final   Klebsiella pneumoniae NOT DETECTED NOT DETECTED Final   Proteus species NOT DETECTED NOT DETECTED Final   Salmonella species NOT DETECTED NOT DETECTED Final   Serratia marcescens NOT DETECTED NOT DETECTED Final   Haemophilus influenzae NOT DETECTED NOT DETECTED Final   Neisseria meningitidis NOT DETECTED NOT DETECTED Final   Pseudomonas aeruginosa NOT DETECTED NOT DETECTED Final   Stenotrophomonas maltophilia NOT DETECTED NOT DETECTED Final   Candida albicans NOT DETECTED NOT DETECTED Final    Candida auris NOT DETECTED NOT DETECTED Final   Candida glabrata NOT DETECTED NOT DETECTED Final   Candida krusei NOT DETECTED NOT DETECTED Final   Candida parapsilosis NOT DETECTED NOT DETECTED Final   Candida tropicalis NOT DETECTED NOT DETECTED Final   Cryptococcus neoformans/gattii NOT DETECTED NOT DETECTED Final   Meth resistant mecA/C and MREJ DETECTED (A) NOT DETECTED Final    Comment: CRITICAL RESULT CALLED TO, READ BACK BY AND VERIFIED WITH: L ADKINS RN 03/09/20 0208 JDW Performed at [facility], [address] [place], [place] [address]     [x]  Patient discharged originally without antimicrobial agent and treatment is now indicated  Patient was contacted by ED provider and instructed to go to ED for IV antibiotics. Patient aware and understands instructions.   ED Provider: , PA-C   Trudee Grip Boothby 03/11/2020, 11:26 AM Clinical Pharmacist Monday - Friday phone -  [phone]  Saturday - Sunday phone - [phone]

## 2020-03-11 NOTE — H&P (Signed)
History and Physical    Robert Kramer Robert Kramer:[id number] DOB: 09-23-1975 DOA: 03/11/2020  PCP: Patient, No Pcp Per  Patient coming from: Home  I have personally briefly reviewed patient's old medical records in [place]  Chief Complaint: Abnormal blood cultures  HPI: Robert Kramer is a 44 y.o. male with medical history significant for polysubstance use disorder including injection drug use and bipolar disorder who presents to the ED for evaluation of abnormal blood cultures.  Patient was seen in the [facility] ED 03/06/2020 for evaluation and management of heroin withdrawal.  He was treated supportively.  Blood cultures were obtained and have now grown out MRSA and Serratia marcescens.  He was called today to come to the ED for further evaluation.  Patient states he is an IV drug user injecting heroin at any site in his arms or hands that he can gain access.  He reports last use 3 days ago.  He has been having ongoing fevers, chills, diaphoresis, shortness of breath, nonproductive cough, nausea, abdominal pain, and generally feeling unwell.  He feels as if he is about to go into opioid withdrawal.  He says he has used Suboxone in the past with benefit.  He denies any other illicit drug use or alcohol use.  ED Course:  Initial vitals showed BP 121/73, pulse 125, RR 24, temp 98.1 Fahrenheit, SPO2 98% on room air.  Labs show WBC 13.7, hemoglobin 10.7, platelets 32,000 (51,000 on 03/05/2020), sodium 134, potassium 3.1, bicarb 24, BUN 21, creatinine 0.81, serum glucose 141, AST 67, ALT 32, alk phos 76, total bilirubin 1.5, lactic acid 2.0 >> 2.5.  Blood and urine cultures were obtained and pending.  SARS-CoV-2 PCR is negative.  Portable chest x-ray shows multiple areas of hazy airspace opacities with cavitation in the inferior right upper lobe.  CTA chest PE study shows multiple cavitary and noncavitary nodules consistent with septic emboli, bilateral lower lobe pneumonia, mild bilateral hilar  adenopathy felt to be reactive.  No plan for pulmonary emboli reported.  Patient was given 2.25 L LR, oral K 40 mEq, IV Ativan 1 mg once, and started on IV vancomycin and ceftriaxone.  The hospitalist service was consulted to admit for further evaluation and management.  Review of Systems: All systems reviewed and are negative except as documented in history of present illness above.   Past Medical History:  Diagnosis Date  . Anxiety   . Bipolar 1 disorder ([place])   . Bipolar 1 disorder ([place])   . DDD (degenerative disc disease), lumbar   . Depression   . Drug-seeking behavior   . Hallux valgus with bunions   . Mood swings   . Polysubstance abuse [facility])     Past Surgical History:  Procedure Laterality Date  . 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 He has a 2.50 pack-year smoking history. He has never used smokeless tobacco. He reports current alcohol use of about 1.0 standard drink of alcohol per week. He reports that he does not use drugs.  Allergies  Allergen Reactions  . Tramadol Other (See Comments)    Upset stomach    Family History  Problem Relation Age of Onset  . Cancer Father      Prior to Admission medications   Medication Sig Start Date End Date Taking? Authorizing Provider  acetaminophen (TYLENOL) 500 MG tablet Take 500 mg by mouth every 6 (six) hours as needed  for mild pain or moderate pain. Patient not taking: Reported on 03/11/2020    [provider]  cyclobenzaprine (FLEXERIL) 10 MG tablet Take 1 tablet (10 mg total) by mouth 3 (three) times daily as needed. Patient not taking: Reported on 03/11/2020 01/14/15   Triplett, Tammy, PA-C  dicyclomine (BENTYL) 20 MG tablet Take 1 tablet (20 mg total) by mouth 2 (two) times daily. Patient not taking: Reported on 03/11/2020 03/06/20   Caccavale, Sophia, PA-C  hydrOXYzine (ATARAX/VISTARIL) 25 MG tablet Take 1 tablet (25 mg total) by mouth every 8 (eight)  hours as needed. Patient not taking: Reported on 03/11/2020 03/06/20   Caccavale, Sophia, PA-C  ibuprofen (ADVIL,MOTRIN) 800 MG tablet Take 1 tablet (800 mg total) by mouth 3 (three) times daily. Patient not taking: Reported on 03/11/2020 01/14/15   Triplett, Tammy, PA-C  ketoconazole (NIZORAL) 2 % cream Apply 1 application topically 2 (two) times daily. Patient not taking: Reported on 03/11/2020 10/23/16   Frederica Kuster, PA-C  loperamide (IMODIUM) 2 MG capsule Take 1 capsule (2 mg total) by mouth 4 (four) times daily as needed for diarrhea or loose stools. Patient not taking: Reported on 03/11/2020 03/06/20   Caccavale, Sophia, PA-C  methocarbamol (ROBAXIN) 500 MG tablet Take 1 tablet (500 mg total) by mouth 2 (two) times daily. Patient not taking: Reported on 03/11/2020 03/06/20   Caccavale, Sophia, PA-C  naproxen (NAPROSYN) 500 MG tablet Take 1 tablet (500 mg total) by mouth 2 (two) times daily with a meal. Patient not taking: Reported on 03/11/2020 03/06/20   Caccavale, Sophia, PA-C  ondansetron (ZOFRAN ODT) 4 MG disintegrating tablet Take 1 tablet (4 mg total) by mouth every 8 (eight) hours as needed for nausea. Patient not taking: Reported on 03/11/2020 03/06/20   Caccavale, Sophia, PA-C  oxyCODONE-acetaminophen (PERCOCET/ROXICET) 5-325 MG per tablet Take 1 tablet by mouth every 4 (four) hours as needed. Patient not taking: Reported on 03/11/2020 02/12/15   Larene Pickett, PA-C    Physical Exam: Vitals:   03/11/20 1521 03/11/20 1603 03/11/20 1730 03/11/20 1832  BP: 121/73 107/65 112/66 112/69  Pulse: (!) 125 (!) 116 94 99  Resp: (!) 24 (!) 32 18 (!) 28  Temp: 98.1 F (36.7 C)     TempSrc: Oral     SpO2: 98% 98% 99% 100%  Weight: 70.8 kg     Height: _0  (1.803 m)      Constitutional: Resting supine in bed, somewhat anxious but otherwise in NAD Eyes: PERRL, lids and conjunctivae normal ENMT: Mucous membranes are moist. Posterior pharynx clear of any exudate or lesions. Neck: normal, supple, no  masses. Respiratory: Bibasilar inspiratory crackles.  Normal respiratory effort. No accessory muscle use.  Cardiovascular: Regular rate and rhythm, systolic murmur heard best at the left lower sternal border. No extremity edema. 2+ pedal pulses. Abdomen: Mild generalized tenderness, no masses palpated. No hepatosplenomegaly. Bowel sounds positive.  Musculoskeletal: no clubbing / cyanosis. No joint deformity upper and lower extremities. Good ROM, no contractures. Normal muscle tone.  Skin: no rashes, lesions, ulcers. No induration Neurologic: CN 2-12 grossly intact. Sensation intact, Strength 5/5 in all 4.  Psychiatric: Alert and oriented x 3.  Somewhat anxious mood.     Labs on Admission: I have personally reviewed following labs and imaging studies  CBC: Recent Labs  Lab 03/05/20 1641 03/11/20 1536  WBC 18.8* 13.7*  NEUTROABS  --  12.5*  HGB 12.9* 10.7*  HCT 40.8 31.8*  MCV 87.2 83.7  PLT 51* 32*  Basic Metabolic Panel: Recent Labs  Lab 03/05/20 1641 03/11/20 1536  NA 127* 134*  K 3.6 3.1*  CL 90* 101  CO2 26 24  GLUCOSE 207* 141*  BUN 24* 21*  CREATININE 1.01 0.81  CALCIUM 8.4* 7.9*   GFR: Estimated Creatinine Clearance: 114.1 mL/min (by C-G formula based on SCr of 0.81 mg/dL). Liver Function Tests: Recent Labs  Lab 03/11/20 1536  AST 67*  ALT 32  ALKPHOS 76  BILITOT 1.5*  PROT 6.5  ALBUMIN 1.7*   No results for input(s): LIPASE, AMYLASE in the last 168 hours. No results for input(s): AMMONIA in the last 168 hours. Coagulation Profile: Recent Labs  Lab 03/11/20 1536  INR 1.5*   Cardiac Enzymes: No results for input(s): CKTOTAL, CKMB, CKMBINDEX, TROPONINI in the last 168 hours. BNP (last 3 results) No results for input(s): PROBNP in the last 8760 hours. HbA1C: No results for input(s): HGBA1C in the last 72 hours. CBG: No results for input(s): GLUCAP in the last 168 hours. Lipid Profile: No results for input(s): CHOL, HDL, LDLCALC, TRIG, CHOLHDL,  LDLDIRECT in the last 72 hours. Thyroid Function Tests: No results for input(s): TSH, T4TOTAL, FREET4, T3FREE, THYROIDAB in the last 72 hours. Anemia Panel: No results for input(s): VITAMINB12, FOLATE, FERRITIN, TIBC, IRON, RETICCTPCT in the last 72 hours. Urine analysis:    Component Value Date/Time   COLORURINE AMBER (A) 03/11/2020 1528   APPEARANCEUR HAZY (A) 03/11/2020 1528   LABSPEC 1.018 03/11/2020 1528   PHURINE 5.0 03/11/2020 1528   GLUCOSEU NEGATIVE 03/11/2020 1528   HGBUR NEGATIVE 03/11/2020 1528   BILIRUBINUR NEGATIVE 03/11/2020 1528   KETONESUR NEGATIVE 03/11/2020 1528   PROTEINUR 30 (A) 03/11/2020 1528   UROBILINOGEN 2.0 (H) 01/10/2015 2019   NITRITE NEGATIVE 03/11/2020 1528   LEUKOCYTESUR NEGATIVE 03/11/2020 1528    Radiological Exams on Admission: CT Angio Chest PE W/Cm &/Or Wo Cm  Result Date: 03/11/2020 CLINICAL DATA:  Sepsis. Diffuse body pain. Concern for septic emboli on a portable chest earlier today. EXAM: CT ANGIOGRAPHY CHEST WITH CONTRAST TECHNIQUE: Multidetector CT imaging of the chest was performed using the standard protocol during bolus administration of intravenous contrast. Multiplanar CT image reconstructions and MIPs were obtained to evaluate the vascular anatomy. CONTRAST:  173m OMNIPAQUE IOHEXOL 350 MG/ML SOLN COMPARISON:  Portable chest obtained earlier today. Chest, abdomen and pelvis CT dated 04/20/2014. FINDINGS: Cardiovascular: Satisfactory opacification of the pulmonary arteries to the segmental level. No evidence of pulmonary embolism. Normal heart size. No pericardial effusion. Mediastinum/Nodes: Mildly enlarged bilateral hilar lymph nodes. These include a 15 mm short axis node on the right on image number 68 series 5 and 13 mm short axis node on the left on image number 74 series 5. No enlarged mediastinal or axillary nodes. Unremarkable thyroid gland. Unremarkable esophagus. Lungs/Pleura: Multiple cavitary nodules of varying sizes throughout both  lungs. Some of these contain air-fluid levels. There are also multiple non cavitary nodules in both lungs with poorly defined margins. Also demonstrated is patchy airspace opacity in the posterior aspects of both lower lobes, greater on the right. No pleural fluid. Upper Abdomen: There is a suggestion of hepatosplenomegaly, neither included in their entirety. Musculoskeletal: Interval approximately 20% T6 superior endplate compression deformity with mild Schmorl's node formation. No acute fracture lines or bony retropulsion. Review of the MIP images confirms the above findings. IMPRESSION: 1. No bland pulmonary emboli. 2. Multiple cavitary and non cavitary nodules of varying sizes throughout both lungs, compatible with septic emboli. 3. Bilateral lower  lobe pneumonia. 4. Mild bilateral hilar adenopathy, most likely reactive. 5. Probable hepatosplenomegaly, neither included in their entirety. Electronically Signed   By: Claudie Revering M.D.   On: 03/11/2020 18:38   DG Chest Port 1 View  Result Date: 03/11/2020 CLINICAL DATA:  Fever.  Potential sepsis. EXAM: PORTABLE CHEST 1 VIEW COMPARISON:  07/12/2014 FINDINGS: Cardiac silhouette is normal in size. Normal mediastinal and hilar contours. Patchy areas of hazy airspace opacity mid and lower lungs, with a possible area of cavitation associated with opacities in the right upper lobe above the minor fissure. There is no evidence of pulmonary edema. No pleural effusion or pneumothorax. Skeletal structures are grossly intact. IMPRESSION: 1. Multiple areas of hazy airspace opacity in the mid to lower lungs, with some apparent cavitation in the inferior right upper lobe. Findings may reflect multiple septic emboli. Multifocal pneumonia is also in the differential diagnosis including atypical/viral etiologies. Electronically Signed   By: Lajean Manes M.D.   On: 03/11/2020 16:34    EKG: Independently reviewed. Sinus tachycardia.  No prior for  comparison.  Assessment/Plan Principal Problem:   Severe sepsis due to methicillin resistant Staphylococcus aureus (MRSA) with acute organ dysfunction (HCC) Active Problems:   Polysubstance dependence (HCC)   Hypokalemia   Thrombocytopenia (HCC)   Serratia septicemia ([place])   Bipolar 1 disorder (HCC)  Robert Kramer is a 44 y.o. male with medical history significant for polysubstance use disorder including injection drug use and bipolar disorder who is admitted with severe sepsis due to MRSA and Serratia bacteremia.  Severe sepsis due to MRSA and Serratia bacteremia with pulmonary septic emboli and pneumonia: Presenting with tachypnea with RR up to 32, tachycardia up to 125, and evidence of endorgan damage with platelet count of 32,000.  Blood cultures from 03/06/2020 grew out MRSA and Serratia.  CT chest shows multiple cavitary and noncavitary septic emboli and bilateral lower lobe pneumonia.  He has been started on appropriate antibiotics. -Continue IV vancomycin to treat MRSA -Continue IV ceftriaxone to treat Serratia -Obtain echocardiogram -Continue IV fluid resuscitation overnight -Repeat blood cultures were collected 9/6  Substance use disorder: Reports ongoing IV heroin use, last use 3 days prior to this admission.  He is about to go into opioid withdrawal. -Start Suboxone 8-2 mg twice daily with as needed Suboxone per opioid withdrawal protocol -Supportive care with Tylenol, antiemetics, Imodium, Atarax as needed -TOC consult -Check HIV and hepatitis C antibodies  Thrombocytopenia: Likely due to sepsis.  No obvious bleeding. Avoid blood thinners and continue to monitor.  Hypokalemia: Repleted orally.  Magnesium is 2.0.  Bipolar/depression/anxiety: Not currently on medical therapy.  DVT prophylaxis: SCDs Code Status: Full code Family Communication: Discussed with patient, he has discussed with family Disposition Plan: From home, discharge pending further work-up and  management of bacteremia Consults called: Will need ID consult in a.m. Admission status:  Status is: Inpatient  Remains inpatient appropriate because:IV treatments appropriate due to intensity of illness or inability to take PO and Inpatient level of care appropriate due to severity of illness   Dispo: The patient is from: Home              Anticipated d/c is to: TBD pending clinical course, may need to go to SNF for prolonged IV antibiotics              Anticipated d/c date is: > 3 days              Patient currently is not medically stable  to d/c.    Zada Finders MD [facility]  If 7PM-7AM, please contact night-coverage [email or web address]  03/11/2020, 7:51 PM

## 2020-03-11 NOTE — Progress Notes (Signed)
Pharmacy Antibiotic Note  Robert Kramer is a 44 y.o. male admitted on 03/11/2020 with MRSA bacteremia.  Pharmacy has been consulted for Vancomycin dosing.  Currently afebrile- reports fevers at home Leukocytosis- improving Scr at baseline  Plan: Vancomycin 1500mg  IV x1 then 1gm IV q8h Consider empiric serratia coverage Monitor renal function and cx data  Check Vancomycin level at steady state  Height: 5\' 11"  (180.3 cm) Weight: 70.8 kg (156 lb) IBW/kg (Calculated) : 75.3  Temp (24hrs), Avg:98.1 F (36.7 C), Min:98.1 F (36.7 C), Max:98.1 F (36.7 C)  Recent Labs  Lab 03/05/20 1641 03/11/20 1536  WBC 18.8* 13.7*  CREATININE 1.01 0.81  LATICACIDVEN  --  2.0*    Estimated Creatinine Clearance: 114.1 mL/min (by C-G formula based on SCr of 0.81 mg/dL).    Allergies  Allergen Reactions   Tramadol Other (See Comments)    Upset stomach    Antimicrobials this admission: 9/6 Vancomycin >>   Dose adjustments this admission:  Microbiology results: 9/6 BCx:   9/1 BCx x1: MRSA 9/1 BCx x1: Serratia Marcescens (S=Rocephin, Bactrim, Cipro; R=Cefazolin)  Thank you for allowing pharmacy to be a part of this patients care.  11/1 PharmD, BCPS 03/11/2020 4:57 PM

## 2020-03-11 NOTE — Progress Notes (Signed)
Notified provider of need to order repeat lactic acid (3rd) .order received.

## 2020-03-11 NOTE — Telephone Encounter (Signed)
44 year old male had presented to the [facility] emergency department on 9/1 with heroin withdrawal.  He had used a dirty needle 2 days prior.  White blood cell count of 18, was afebrile, not hypotensive at the time.  I was notified today by pharmacist that Mr. Robert Kramer blood culture grew staph aureus and Serratia marcescens.  Per pharmacy, ID attempted to contact the patient with no success.  I personally called the patient on his home phone number, spoke with him and his sister, told him that he needs to promptly  go to an emergency room as soon as possible with concerns for sepsis.  Patient states that he has not been feeling well.  He and his sister voiced understanding that this is a medical emergency and he needs to seek medical care immediately.  They intend to go to the [facility] emergency department.

## 2020-03-11 NOTE — Telephone Encounter (Signed)
Pt and sister called regarding a call they received about abnormal labs.  Pt states she was told to take pt back to ER and since [facility] was closer to where they were, she would take him there.

## 2020-03-12 ENCOUNTER — Inpatient Hospital Stay (HOSPITAL_COMMUNITY): Payer: Self-pay

## 2020-03-12 DIAGNOSIS — F319 Bipolar disorder, unspecified: Secondary | ICD-10-CM

## 2020-03-12 DIAGNOSIS — I340 Nonrheumatic mitral (valve) insufficiency: Secondary | ICD-10-CM

## 2020-03-12 DIAGNOSIS — F1920 Other psychoactive substance dependence, uncomplicated: Secondary | ICD-10-CM

## 2020-03-12 DIAGNOSIS — B182 Chronic viral hepatitis C: Secondary | ICD-10-CM

## 2020-03-12 LAB — CBC
HCT: 30 % — ABNORMAL LOW (ref 39.0–52.0)
Hemoglobin: 9.6 g/dL — ABNORMAL LOW (ref 13.0–17.0)
MCH: 28.1 pg (ref 26.0–34.0)
MCHC: 32 g/dL (ref 30.0–36.0)
MCV: 87.7 fL (ref 80.0–100.0)
Platelets: 26 10*3/uL — CL (ref 150–400)
RBC: 3.42 MIL/uL — ABNORMAL LOW (ref 4.22–5.81)
RDW: 14.3 % (ref 11.5–15.5)
WBC: 12.6 10*3/uL — ABNORMAL HIGH (ref 4.0–10.5)
nRBC: 0 % (ref 0.0–0.2)

## 2020-03-12 LAB — COMPREHENSIVE METABOLIC PANEL
ALT: 28 U/L (ref 0–44)
AST: 56 U/L — ABNORMAL HIGH (ref 15–41)
Albumin: 1.3 g/dL — ABNORMAL LOW (ref 3.5–5.0)
Alkaline Phosphatase: 63 U/L (ref 38–126)
Anion gap: 6 (ref 5–15)
BUN: 20 mg/dL (ref 6–20)
CO2: 26 mmol/L (ref 22–32)
Calcium: 7.8 mg/dL — ABNORMAL LOW (ref 8.9–10.3)
Chloride: 102 mmol/L (ref 98–111)
Creatinine, Ser: 0.52 mg/dL — ABNORMAL LOW (ref 0.61–1.24)
GFR calc Af Amer: >60 mL/min (ref >60)
GFR calc non Af Amer: >60 mL/min (ref >60)
Glucose, Bld: 114 mg/dL — ABNORMAL HIGH (ref 70–99)
Potassium: 4.4 mmol/L (ref 3.5–5.1)
Sodium: 134 mmol/L — ABNORMAL LOW (ref 135–145)
Total Bilirubin: 1 mg/dL (ref 0.3–1.2)
Total Protein: 5.5 g/dL — ABNORMAL LOW (ref 6.5–8.1)

## 2020-03-12 LAB — IMMATURE PLATELET FRACTION: Immature Platelet Fraction: 15.7 % — ABNORMAL HIGH (ref 1.2–8.6)

## 2020-03-12 LAB — ECHOCARDIOGRAM COMPLETE
Height: 71 in
S' Lateral: 2.9 cm
Weight: 2496 oz

## 2020-03-12 LAB — LACTIC ACID, PLASMA: Lactic Acid, Venous: 1.2 mmol/L (ref 0.5–1.9)

## 2020-03-12 LAB — HEMOGLOBIN A1C
Hgb A1c MFr Bld: 6.3 % — ABNORMAL HIGH (ref 4.8–5.6)
Mean Plasma Glucose: 134.11 mg/dL

## 2020-03-12 LAB — HEPATITIS C ANTIBODY: HCV Ab: REACTIVE — AB

## 2020-03-12 LAB — HIV ANTIBODY (ROUTINE TESTING W REFLEX): HIV Screen 4th Generation wRfx: NONREACTIVE

## 2020-03-12 MED ORDER — PNEUMOCOCCAL VAC POLYVALENT 25 MCG/0.5ML IJ INJ
0.5000 mL | INJECTION | INTRAMUSCULAR | Status: AC
  Administered 2020-03-13: 0.5 mL via INTRAMUSCULAR
  Filled 2020-03-12: qty 0.5

## 2020-03-12 MED ORDER — SODIUM CHLORIDE 0.9 % IV SOLN
2.0000 g | Freq: Three times a day (TID) | INTRAVENOUS | Status: DC
  Administered 2020-03-12 – 2020-03-18 (×18): 2 g via INTRAVENOUS
  Filled 2020-03-12 (×20): qty 2

## 2020-03-12 NOTE — Progress Notes (Addendum)
PROGRESS NOTE  Robert Kramer IDP:[id number] DOB: June 26, 1976 DOA: 03/11/2020 PCP: Patient, No Pcp Per  Brief History   Robert Kramer is a 44 y.o. male with medical history significant for polysubstance use disorder including injection drug use and bipolar disorder who presents to the ED for evaluation of abnormal blood cultures.   Patient was seen in the [facility] 03/06/2020 for evaluation and management of heroin withdrawal.  He was treated supportively.  Blood cultures were obtained and have now grown out MRSA and Serratia marcescens.  He was called today to come to the ED for further evaluation.   Patient states he is an IV drug user injecting heroin at any site in his arms or hands that he can gain access.  He reports last use 3 days ago.  He has been having ongoing fevers, chills, diaphoresis, shortness of breath, nonproductive cough, nausea, abdominal pain, and generally feeling unwell.  He feels as if he is about to go into opioid withdrawal.  He says he has used Suboxone in the past with benefit.  He denies any other illicit drug use or alcohol use.   ED Course:  Initial vitals showed BP 121/73, pulse 125, RR 24, temp 98.1 Fahrenheit, SPO2 98% on room air.   Labs show WBC 13.7, hemoglobin 10.7, platelets 32,000 (51,000 on 03/05/2020), sodium 134, potassium 3.1, bicarb 24, BUN 21, creatinine 0.81, serum glucose 141, AST 67, ALT 32, alk phos 76, total bilirubin 1.5, lactic acid 2.0 >> 2.5.   Blood and urine cultures were obtained and pending.  SARS-CoV-2 PCR is negative.   Portable chest x-ray shows multiple areas of hazy airspace opacities with cavitation in the inferior right upper lobe.   CTA chest PE study shows multiple cavitary and noncavitary nodules consistent with septic emboli, bilateral lower lobe pneumonia, mild bilateral hilar adenopathy felt to be reactive.  No plan for pulmonary emboli reported.   Patient was given 2.25 L LR, oral K 40 mEq, IV Ativan 1 mg once, and started  on IV vancomycin and ceftriaxone.  The hospitalist service was consulted to admit for further evaluation and management.  TTE was performed on 03/12/2020. It has demonstratred "shaggy" vegetations on the tricuspid valve. TEE was recommended to further clarify the problem.  Consultants  Infectious Disease  Procedures  None  Antibiotics   Anti-infectives (From admission, onward)    Start     Dose/Rate Route Frequency Ordered Stop   03/12/20 2000  cefTRIAXone (ROCEPHIN) 2 g in sodium chloride 0.9 % 100 mL IVPB  Status:  Discontinued        2 g 200 mL/hr over 30 Minutes Intravenous Every 24 hours 03/11/20 2058 03/12/20 0937   03/12/20 1400  ceFEPIme (MAXIPIME) 2 g in sodium chloride 0.9 % 100 mL IVPB        2 g 200 mL/hr over 30 Minutes Intravenous Every 8 hours 03/12/20 0937     03/12/20 0000  vancomycin (VANCOCIN) IVPB 1000 mg/200 mL premix        1,000 mg 200 mL/hr over 60 Minutes Intravenous Every 8 hours 03/11/20 1816     03/11/20 1830  cefTRIAXone (ROCEPHIN) 2 g in sodium chloride 0.9 % 100 mL IVPB        2 g 200 mL/hr over 30 Minutes Intravenous  Once 03/11/20 1822 03/11/20 2030   03/11/20 1545  vancomycin (VANCOREADY) IVPB 1500 mg/300 mL        1,500 mg 150 mL/hr over 120 Minutes Intravenous  Once 03/11/20 1532 03/11/20 1819      Subjective  The patient is resting on the gurney in the ED. He is having chills and complaining of pain and aches all over.  Objective   Vitals:  Vitals:   03/12/20 1645 03/12/20 1800  BP:  114/78  Pulse: (!) 137 (!) 135  Resp: (!) 35 (!) 23  Temp:    SpO2: 95% 93%   Exam:  Constitutional:  The patient is awake, alert, and oriented x 3. He is in moderate distress from chills and myalgias. Respiratory:  No increased work of breathing. No wheezes, rales, or rhonchi No tactile fremitus Cardiovascular:  Regular rate and rhythm No murmurs, ectopy, or gallups. No lateral PMI. No thrills. Abdomen:  Abdomen is soft, non-tender,  non-distended No hernias, masses, or organomegaly Normoactive bowel sounds.  Musculoskeletal:  No cyanosis, clubbing, or edema Skin:  No rashes, lesions, ulcers palpation of skin: no induration or nodules Neurologic:  CN 2-12 intact Sensation all 4 extremities intact Psychiatric:  Mental status Mood, affect appropriate Orientation to person, place, time  judgment and insight appear intact  I have personally reviewed the following:   Today's Data  Vitals, CMP, Lactic acid, CBC  Micro Data  Blood Cultures x 2: Serratia [place], MRSA   Cardiology Data  Echocardiogram  Scheduled Meds:  buprenorphine-naloxone  1 tablet Sublingual BID   sodium chloride flush  3 mL Intravenous Q12H   Continuous Infusions:  ceFEPime (MAXIPIME) IV 2 g (03/12/20 1421)   lactated ringers Stopped (03/11/20 1952)   vancomycin 1,000 mg (03/12/20 1637)    Principal Problem:   Severe sepsis due to methicillin resistant Staphylococcus aureus (MRSA) with acute organ dysfunction (HCC) Active Problems:   Polysubstance dependence (HCC)   Hypokalemia   Thrombocytopenia (HCC)   Serratia septicemia ([name])   Bipolar 1 disorder ([name])   LOS: 1 day   A & P  Severe sepsis due to MRSA and Serratia bacteremia with pulmonary septic emboli and pneumonia: Presenting with tachypnea with RR up to 32, tachycardia up to 125, and evidence of endorgan damage with platelet count of 32,000.  Blood cultures from 03/06/2020 grew out MRSA and Serratia.  CT chest shows multiple cavitary and noncavitary septic emboli and bilateral lower lobe pneumonia.  He has been started on appropriate antibiotics. -Continue IV vancomycin to treat MRSA -Continue IV ceftriaxone to treat Serratia -Obtain echocardiogram -Continue IV fluid resuscitation overnight -Repeat blood cultures were collected 9/6  Endocarditis: "Shaggy" Vegetations seen on tricuspid valve in echocardiogram. TEE ordered.   Substance use disorder: Reports ongoing IV  heroin use, last use 3 days prior to this admission.  He is at risk to go into opioid withdrawal. The patient is receiving suboxone 8-2 mg twice daily with as needed Suboxone per opioid withdrawal protocol. He is also receiving supportive care with Tylenol, antiemetics, Imodium, Atarax as needed. TOC consult ordered for substance abuse rehab referrals. Check HIV and hepatitis C antibodies.  Minimally decreased sodium at 134: Likely chronic. Of no particular clinical significance at this point. Monitor.   Thrombocytopenia:Likely due to sepsis.  No obvious bleeding. Avoid blood thinners and continue to monitor.   Hypokalemia: Supplement and monitor.  Magnesium is 2.0.   Bipolar/depression/anxiety: Not currently on medical therapy.  I have seen and examined this patient myself. I have spent 38 minutes in his evaluation and care.   DVT prophylaxis: SCDs Code Status: Full code Family Communication: Discussed with patient, he has discussed with family Disposition Plan: From  home, discharge pending further work-up and management of bacteremia  Admission status:  Status is: Inpatient   Remains inpatient appropriate because:IV treatments appropriate due to intensity of illness or inability to take PO and Inpatient level of care appropriate due to severity of illness     Dispo: The patient is from: Home              Anticipated d/c is to: TBD pending clinical course, may need to go to SNF for prolonged IV antibiotics              Anticipated d/c date is: > 3 days              Patient currently is not medically stable to d/c.     Anona Giovannini, DO [facility] Direct contact: see [email or web address]  7PM-7AM contact night coverage as above 03/12/2020, 6:27 PM  LOS: 1 day

## 2020-03-12 NOTE — Progress Notes (Signed)
Updated sister, Helene Kelp via phone at [phone].

## 2020-03-12 NOTE — Progress Notes (Signed)
  Echocardiogram 2D Echocardiogram has been performed.  Celene Skeen 03/12/2020, 12:02 PM

## 2020-03-12 NOTE — ED Notes (Signed)
Jadiel Schmieder, sister would like an update on her brother, [phone], hasn't gotten an update in 2 days.

## 2020-03-12 NOTE — Consult Note (Signed)
Date of Admission:  03/11/2020          Reason for Consult: MRSA and Serratia bacteremia    Referring Provider: Connye Burkitt auto consult   Assessment:  1. Polymicrobial bacteremia with MRSA and proteus in blood and  2. 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 Right sided end 4. IVDU with heroin 5. Chronic hepatitis C without hepatic coma 6. Opioid withdrawal  Plan:  1. Continue cefepime and vancomycin 2. Repeat blood cultures tomorrow 3. Arrange for TEE 4. Consult CT surgery re his large right sided vegetations 5. Check HCV rna quant and genotype, hep B and A  Principal Problem:   Severe sepsis due to methicillin resistant Staphylococcus aureus (MRSA) with acute organ dysfunction (HCC) Active Problems:   Polysubstance dependence (HCC)   Hypokalemia   Thrombocytopenia (HCC)   Serratia septicemia (HCC)   Bipolar 1 disorder (HCC)   Scheduled Meds: . buprenorphine-naloxone  1 tablet Sublingual BID  . sodium chloride flush  3 mL Intravenous Q12H   Continuous Infusions: . ceFEPime (MAXIPIME) IV 2 g (03/12/20 1421)  . lactated ringers Stopped (03/11/20 1952)  . vancomycin 1,000 mg (03/12/20 1637)   PRN Meds:.acetaminophen **OR** acetaminophen, buprenorphine-naloxone, hydrOXYzine, loperamide, ondansetron **OR** ondansetron (ZOFRAN) IV  HPI: Robert Kramer is a 44 y.o. male with past medical history significant for injection drug use and heroin addiction who presented to the ER on September 1 with concerns for heroin withdrawal.  Blood cultures were obtained during that admission and he was treated supportively and discharged from the emergency department.  His blood culture subsequently grown MRSA and Serratia.  He was called from the ER to return and he came back and was admitted through the ER [facility] long.  Blood cultures were repeated again prior to initiation of antibiotics and is already growing gram-positive cocci from these cultures what.  He has  had a CT scan of the chest performed which shows multiple cavitary lesions consistent with septic emboli from endocarditis.  Indeed 2D echocardiogram shows multiple shaggy vegetations on the tricuspid valve.  He should continue on vancomycin to cover MRSA as well as cefepime for his Serratia.  He does need a transesophageal echocardiogram.  His blood cultures need to be repeated tomorrow.  He will need a cardiothoracic surgery consult after TEE.  He is agreeable to stay in the hospital to receive parenteral antibiotics we have not pin down how long he would be willing to stay.  He also is adamant that he wants to come off of heroin.       Review of Systems: Review of Systems  Constitutional: Positive for chills and fever. Negative for diaphoresis, malaise/fatigue and weight loss.  HENT: Negative for congestion, hearing loss, sore throat and tinnitus.   Eyes: Negative for blurred vision and double vision.  Respiratory: Negative for cough, sputum production, shortness of breath and wheezing.   Cardiovascular: Positive for palpitations. Negative for chest pain and leg swelling.  Gastrointestinal: Negative for abdominal pain, blood in stool, constipation, diarrhea, heartburn, melena, nausea and vomiting.  Genitourinary: Negative for dysuria, flank pain and hematuria.  Musculoskeletal: Positive for myalgias. Negative for back pain, falls and joint pain.  Skin: Negative for itching and rash.  Neurological: Negative for dizziness, sensory change, focal weakness, loss of consciousness, weakness and headaches.  Endo/Heme/Allergies: Does not bruise/bleed easily.  Psychiatric/Behavioral: Positive for substance abuse. Negative for depression, memory loss and suicidal ideas. The patient is not nervous/anxious.     Past  Medical History:  Diagnosis Date  . Anxiety   . Bipolar 1 disorder (HCC)   . Bipolar 1 disorder (HCC)   . DDD (degenerative disc disease), lumbar   . Depression   .  Drug-seeking behavior   . Hallux valgus with bunions   . Mood swings   . Polysubstance abuse (HCC)     Social History   Tobacco Use  . Smoking status: Current Every Day Smoker    Packs/day: 0.50    Years: 5.00    Pack years: 2.50    Types: Cigarettes  . Smokeless tobacco: Never Used  Substance Use Topics  . Alcohol use: Yes    Alcohol/week: 1.0 standard drink    Types: 1 Cans of beer per week    Comment: occ  . Drug use: No    Types: Cocaine, Marijuana    Comment: opiates - quit 2017    Family History  Problem Relation Age of Onset  . Cancer Father    Allergies  Allergen Reactions  . Tramadol Other (See Comments)    Upset stomach    OBJECTIVE: Blood pressure 125/84, pulse (!) 137, temperature 98.1 F (36.7 C), temperature source Oral, resp. rate (!) 35, height 5\' 11"  (1.803 m), weight 70.8 kg, SpO2 95 %.  Physical Exam Constitutional:      General: He is not in acute distress.    Appearance: Normal appearance. He is well-developed. He is ill-appearing. He is not diaphoretic.  HENT:     Head: Normocephalic and atraumatic.     Right Ear: Hearing and external ear normal.     Left Ear: Hearing and external ear normal.     Nose: No nasal deformity or rhinorrhea.  Eyes:     General: No scleral icterus.    Extraocular Movements: Extraocular movements intact.     Conjunctiva/sclera: Conjunctivae normal.     Right eye: Right conjunctiva is not injected.     Left eye: Left conjunctiva is not injected.  Neck:     Vascular: No JVD.  Cardiovascular:     Rate and Rhythm: Normal rate and regular rhythm.     Heart sounds: Normal heart sounds, S1 normal and S2 normal. No friction rub.  Pulmonary:     Effort: No respiratory distress.     Breath sounds: No wheezing.  Abdominal:     General: Bowel sounds are normal. There is no distension.     Palpations: Abdomen is soft.     Tenderness: There is no abdominal tenderness.  Musculoskeletal:        General: Normal range of  motion.     Right shoulder: Normal.     Left shoulder: Normal.     Cervical back: Normal range of motion and neck supple.     Right hip: Normal.     Left hip: Normal.     Right knee: Normal.     Left knee: Normal.  Lymphadenopathy:     Head:     Right side of head: No submandibular, preauricular or posterior auricular adenopathy.     Left side of head: No submandibular, preauricular or posterior auricular adenopathy.     Cervical: No cervical adenopathy.     Right cervical: No superficial or deep cervical adenopathy.    Left cervical: No superficial or deep cervical adenopathy.  Skin:    General: Skin is warm and dry.     Coloration: Skin is not pale.     Findings: No abrasion, bruising, ecchymosis, erythema, lesion  or rash.     Nails: There is no clubbing.  Neurological:     General: No focal deficit present.     Mental Status: He is alert and oriented to person, place, and time.     Sensory: No sensory deficit.     Coordination: Coordination normal.     Gait: Gait normal.  Psychiatric:        Attention and Perception: Attention normal. He is attentive.        Mood and Affect: Mood is anxious.        Speech: Speech normal.        Behavior: Behavior normal. Behavior is cooperative.        Thought Content: Thought content normal.        Cognition and Memory: Cognition and memory normal.        Judgment: Judgment normal.     Lab Results Lab Results  Component Value Date   WBC 12.6 (H) 03/12/2020   HGB 9.6 (L) 03/12/2020   HCT 30.0 (L) 03/12/2020   MCV 87.7 03/12/2020   PLT 26 (LL) 03/12/2020    Lab Results  Component Value Date   CREATININE 0.52 (L) 03/12/2020   BUN 20 03/12/2020   NA 134 (L) 03/12/2020   K 4.4 03/12/2020   CL 102 03/12/2020   CO2 26 03/12/2020    Lab Results  Component Value Date   ALT 28 03/12/2020   AST 56 (H) 03/12/2020   ALKPHOS 63 03/12/2020   BILITOT 1.0 03/12/2020     Microbiology: Recent Results (from the past 240 hour(s))    Blood culture (routine x 2)     Status: Abnormal   Collection Time: 03/06/20  6:34 AM   Specimen: BLOOD RIGHT FOREARM  Result Value Ref Range Status   Specimen Description BLOOD RIGHT FOREARM  Final   Special Requests   Final    BOTTLES DRAWN AEROBIC AND ANAEROBIC Blood Culture adequate volume   Culture  Setup Time   Final    GRAM NEGATIVE RODS AEROBIC BOTTLE ONLY CRITICAL RESULT CALLED TO, READ BACK BY AND VERIFIED WITH: S COBLE RN 1831 03/08/20 A BROWNING GRAM POSITIVE COCCI IN CLUSTERS ANAEROBIC BOTTLE ONLY CRITICAL VALUE NOTED.  VALUE IS CONSISTENT WITH PREVIOUSLY REPORTED AND CALLED VALUE. Performed at [facility], [address] [place], [place] [address]    Culture SERRATIA MARCESCENS (A)  Final   Report Status 03/10/2020 FINAL  Final   Organism ID, Bacteria SERRATIA MARCESCENS  Final      Susceptibility   Serratia marcescens - MIC*    CEFAZOLIN >=64 RESISTANT Resistant     CEFEPIME <=0.12 SENSITIVE Sensitive     CEFTAZIDIME <=1 SENSITIVE Sensitive     CEFTRIAXONE <=0.25 SENSITIVE Sensitive     CIPROFLOXACIN <=0.25 SENSITIVE Sensitive     GENTAMICIN <=1 SENSITIVE Sensitive     TRIMETH/SULFA <=20 SENSITIVE Sensitive     * SERRATIA MARCESCENS  Blood Culture ID Panel (Reflexed)     Status: Abnormal   Collection Time: 03/06/20  6:34 AM  Result Value Ref Range Status   Enterococcus faecalis NOT DETECTED NOT DETECTED Final   Enterococcus Faecium NOT DETECTED NOT DETECTED Final   Listeria monocytogenes NOT DETECTED NOT DETECTED Final   Staphylococcus species NOT DETECTED NOT DETECTED Final   Staphylococcus aureus (BCID) NOT DETECTED NOT DETECTED Final   Staphylococcus epidermidis NOT DETECTED NOT DETECTED Final   Staphylococcus lugdunensis NOT DETECTED NOT DETECTED Final   Streptococcus species NOT DETECTED  NOT DETECTED Final   Streptococcus agalactiae NOT DETECTED NOT DETECTED Final   Streptococcus pneumoniae NOT DETECTED NOT DETECTED Final   Streptococcus pyogenes  NOT DETECTED NOT DETECTED Final   A.calcoaceticus-baumannii NOT DETECTED NOT DETECTED Final   Bacteroides fragilis NOT DETECTED NOT DETECTED Final   Enterobacterales DETECTED (A) NOT DETECTED Final    Comment: Enterobacterales represent a large order of gram negative bacteria, not a single organism. CRITICAL RESULT CALLED TO, READ BACK BY AND VERIFIED WITH: S COBLE RN [phone] 03/08/20 A BROWNING    Enterobacter cloacae complex NOT DETECTED NOT DETECTED Final   Escherichia coli NOT DETECTED NOT DETECTED Final   Klebsiella aerogenes NOT DETECTED NOT DETECTED Final   Klebsiella oxytoca NOT DETECTED NOT DETECTED Final   Klebsiella pneumoniae NOT DETECTED NOT DETECTED Final   Proteus species NOT DETECTED NOT DETECTED Final   Salmonella species NOT DETECTED NOT DETECTED Final   Serratia marcescens DETECTED (A) NOT DETECTED Final    Comment: CRITICAL RESULT CALLED TO, READ BACK BY AND VERIFIED WITH: S COBLE RN 1831 03/08/20 A BROWNING    Haemophilus influenzae NOT DETECTED NOT DETECTED Final   Neisseria meningitidis NOT DETECTED NOT DETECTED Final   Pseudomonas aeruginosa NOT DETECTED NOT DETECTED Final   Stenotrophomonas maltophilia NOT DETECTED NOT DETECTED Final   Candida albicans NOT DETECTED NOT DETECTED Final   Candida auris NOT DETECTED NOT DETECTED Final   Candida glabrata NOT DETECTED NOT DETECTED Final   Candida krusei NOT DETECTED NOT DETECTED Final   Candida parapsilosis NOT DETECTED NOT DETECTED Final   Candida tropicalis NOT DETECTED NOT DETECTED Final   Cryptococcus neoformans/gattii NOT DETECTED NOT DETECTED Final   CTX-M ESBL NOT DETECTED NOT DETECTED Final   Carbapenem resistance IMP NOT DETECTED NOT DETECTED Final   Carbapenem resistance KPC NOT DETECTED NOT DETECTED Final   Carbapenem resistance NDM NOT DETECTED NOT DETECTED Final   Carbapenem resist OXA 48 LIKE NOT DETECTED NOT DETECTED Final   Carbapenem resistance VIM NOT DETECTED NOT DETECTED Final    Comment: Performed  at [facility], [address] [place], [place] [address]  Blood culture (routine x 2)     Status: Abnormal   Collection Time: 03/06/20  6:44 AM   Specimen: BLOOD  Result Value Ref Range Status   Specimen Description BLOOD RIGHT ANTECUBITAL  Final   Special Requests   Final    BOTTLES DRAWN AEROBIC AND ANAEROBIC Blood Culture adequate volume   Culture  Setup Time   Final    IN BOTH AEROBIC AND ANAEROBIC BOTTLES GRAM POSITIVE COCCI IN CLUSTERS CRITICAL RESULT CALLED TO, READ BACK BY AND VERIFIED WITH: L ADKINS RN 03/09/20 0219 JDW Performed at [facility], [address] [place], [place] [address]    Culture METHICILLIN RESISTANT STAPHYLOCOCCUS AUREUS (A)  Final   Report Status 03/10/2020 FINAL  Final   Organism ID, Bacteria METHICILLIN RESISTANT STAPHYLOCOCCUS AUREUS  Final      Susceptibility   Methicillin resistant staphylococcus aureus - MIC*    CIPROFLOXACIN <=0.5 SENSITIVE Sensitive     ERYTHROMYCIN >=8 RESISTANT Resistant     GENTAMICIN <=0.5 SENSITIVE Sensitive     OXACILLIN >=4 RESISTANT Resistant     TETRACYCLINE <=1 SENSITIVE Sensitive     VANCOMYCIN 1 SENSITIVE Sensitive     TRIMETH/SULFA <=10 SENSITIVE Sensitive     CLINDAMYCIN <=0.25 SENSITIVE Sensitive     RIFAMPIN <=0.5 SENSITIVE Sensitive     Inducible Clindamycin NEGATIVE Sensitive     *  METHICILLIN RESISTANT STAPHYLOCOCCUS AUREUS  Blood Culture ID Panel (Reflexed)     Status: Abnormal   Collection Time: 03/06/20  6:44 AM  Result Value Ref Range Status   Enterococcus faecalis NOT DETECTED NOT DETECTED Final   Enterococcus Faecium NOT DETECTED NOT DETECTED Final   Listeria monocytogenes NOT DETECTED NOT DETECTED Final   Staphylococcus species DETECTED (A) NOT DETECTED Final    Comment: CRITICAL RESULT CALLED TO, READ BACK BY AND VERIFIED WITH: L ADKINS RN 03/09/20 0208 JDW    Staphylococcus aureus (BCID) DETECTED (A) NOT DETECTED Final    Comment: Methicillin (oxacillin)-resistant Staphylococcus  aureus (MRSA). MRSA is predictably resistant to beta-lactam antibiotics (except ceftaroline). Preferred therapy is vancomycin unless clinically contraindicated. Patient requires contact precautions if  hospitalized. CRITICAL RESULT CALLED TO, READ BACK BY AND VERIFIED WITH: L ADKINS RN 03/09/20 0208 JDW    Staphylococcus epidermidis NOT DETECTED NOT DETECTED Final   Staphylococcus lugdunensis NOT DETECTED NOT DETECTED Final   Streptococcus species NOT DETECTED NOT DETECTED Final   Streptococcus agalactiae NOT DETECTED NOT DETECTED Final   Streptococcus pneumoniae NOT DETECTED NOT DETECTED Final   Streptococcus pyogenes NOT DETECTED NOT DETECTED Final   A.calcoaceticus-baumannii NOT DETECTED NOT DETECTED Final   Bacteroides fragilis NOT DETECTED NOT DETECTED Final   Enterobacterales NOT DETECTED NOT DETECTED Final   Enterobacter cloacae complex NOT DETECTED NOT DETECTED Final   Escherichia coli NOT DETECTED NOT DETECTED Final   Klebsiella aerogenes NOT DETECTED NOT DETECTED Final   Klebsiella oxytoca NOT DETECTED NOT DETECTED Final   Klebsiella pneumoniae NOT DETECTED NOT DETECTED Final   Proteus species NOT DETECTED NOT DETECTED Final   Salmonella species NOT DETECTED NOT DETECTED Final   Serratia marcescens NOT DETECTED NOT DETECTED Final   Haemophilus influenzae NOT DETECTED NOT DETECTED Final   Neisseria meningitidis NOT DETECTED NOT DETECTED Final   Pseudomonas aeruginosa NOT DETECTED NOT DETECTED Final   Stenotrophomonas maltophilia NOT DETECTED NOT DETECTED Final   Candida albicans NOT DETECTED NOT DETECTED Final   Candida auris NOT DETECTED NOT DETECTED Final   Candida glabrata NOT DETECTED NOT DETECTED Final   Candida krusei NOT DETECTED NOT DETECTED Final   Candida parapsilosis NOT DETECTED NOT DETECTED Final   Candida tropicalis NOT DETECTED NOT DETECTED Final   Cryptococcus neoformans/gattii NOT DETECTED NOT DETECTED Final   Meth resistant mecA/C and MREJ DETECTED (A) NOT  DETECTED Final    Comment: CRITICAL RESULT CALLED TO, READ BACK BY AND VERIFIED WITH: L ADKINS RN 03/09/20 0208 JDW Performed at [facility], [address] [place], [place] [address]   Blood culture (routine x 2)     Status: None (Preliminary result)   Collection Time: 03/11/20  3:31 PM   Specimen: BLOOD  Result Value Ref Range Status   Specimen Description   Final    BLOOD LEFT ARM Performed at [facility], [address], [place], [place] [address]    Special Requests   Final    BOTTLES DRAWN AEROBIC AND ANAEROBIC Blood Culture results may not be optimal due to an excessive volume of blood received in culture bottles Performed at [facility], [address], [place], [place] [address]    Culture  Setup Time   Final    GRAM POSITIVE COCCI IN CLUSTERS IN BOTH AEROBIC AND ANAEROBIC BOTTLES CRITICAL VALUE NOTED.  VALUE IS CONSISTENT WITH PREVIOUSLY REPORTED AND CALLED VALUE.    Culture   Final    NO GROWTH <  24 HOURS Performed at [facility], [address] [place], [place] [address]    Report Status PENDING  Incomplete  SARS Coronavirus 2 by RT PCR (hospital order, performed in [facility] hospital lab) Nasopharyngeal Nasopharyngeal Swab     Status: None   Collection Time: 03/11/20  5:05 PM   Specimen: Nasopharyngeal Swab  Result Value Ref Range Status   SARS Coronavirus 2 NEGATIVE NEGATIVE Final    Comment: (NOTE) SARS-CoV-2 target nucleic acids are NOT DETECTED.  The SARS-CoV-2 RNA is generally detectable in upper and lower respiratory specimens during the acute phase of infection. The lowest concentration of SARS-CoV-2 viral copies this assay can detect is 250 copies / mL. A negative result does not preclude SARS-CoV-2 infection and should not be used as the sole basis for treatment or other patient management decisions.  A negative result may occur with improper specimen collection / handling, submission of specimen  other than nasopharyngeal swab, presence of viral mutation(s) within the areas targeted by this assay, and inadequate number of viral copies (<250 copies / mL). A negative result must be combined with clinical observations, patient history, and epidemiological information.  Fact Sheet for Patients:   BoilerBrush.com.cy  Fact Sheet for Healthcare Providers: https://pope.com/  This test is not yet approved or  cleared by the [place] FDA and has been authorized for detection and/or diagnosis of SARS-CoV-2 by FDA under an Emergency Use Authorization (EUA).  This EUA will remain in effect (meaning this test can be used) for the duration of the COVID-19 declaration under Section 564(b)(1) of the Act, 21 U.S.C. section 360bbb-3(b)(1), unless the authorization is terminated or revoked sooner.  Performed at [facility], [address] [place], [place] [address]     Acey Lav, MD [facility] Infectious Disease [facility] [phone] pager  03/12/2020, 5:02 PM

## 2020-03-12 NOTE — ED Notes (Signed)
Date and time results received: 03/12/20 0443 (use smartphrase ".now" to insert current time)  Test: Platelet Critical Value: 26  Name of Provider Notified: Palumbo MD  Orders Received? Or Actions Taken?: waiting on orders

## 2020-03-12 NOTE — ED Notes (Signed)
   03/12/20 1857  Assess: MEWS Score  Temp 99.3 F (37.4 C)  BP 112/79  Pulse Rate (!) 130  Resp 18  Level of Consciousness Alert  SpO2 95 %  O2 Device Room Air  Assess: MEWS Score  MEWS Temp 0  MEWS Systolic 0  MEWS Pulse 3  MEWS RR 0  MEWS LOC 0  MEWS Score 3  MEWS Score Color Yellow  Assess: if the MEWS score is Yellow or Red  Were vital signs taken at a resting state? Yes  Focused Assessment No change from prior assessment  Early Detection of Sepsis Score *See Row Information* Medium  MEWS guidelines implemented *See Row Information* Yes  Treat  MEWS Interventions Escalated (See documentation below)  Pain Scale 0-10  Pain Score 5  Pain Type Chronic pain  Pain Location Generalized  Pain Descriptors / Indicators Aching  Pain Frequency Constant  Pain Onset Progressive  Patients Stated Pain Goal 3  Pain Intervention(s) Repositioned  Multiple Pain Sites No  Complains of Anxiety  Neuro symptoms relieved by Other (Comment) (suboxone given in ED)  Take Vital Signs  Increase Vital Sign Frequency  Yellow: Q 2hr X 2 then Q 4hr X 2, if remains yellow, continue Q 4hrs  Escalate  MEWS: Escalate Yellow: discuss with charge nurse/RN and consider discussing with provider and RRT  Notify: Charge Nurse/RN  Name of Charge Nurse/RN Notified Marissa RN  Date Charge Nurse/RN Notified 03/12/20  Time Charge Nurse/RN Notified 1859   Patient admitted to ED with endocarditis, MEWS yellow on admission to unit due to pulse.  Not a new finding.  Charge RN Marissa made aware, will initiate frequent VS

## 2020-03-13 DIAGNOSIS — F1110 Opioid abuse, uncomplicated: Secondary | ICD-10-CM | POA: Diagnosis present

## 2020-03-13 DIAGNOSIS — E876 Hypokalemia: Secondary | ICD-10-CM

## 2020-03-13 LAB — HEPATITIS B SURFACE ANTIGEN: Hepatitis B Surface Ag: NONREACTIVE

## 2020-03-13 LAB — VANCOMYCIN, TROUGH: Vancomycin Tr: 16 ug/mL (ref 15–20)

## 2020-03-13 LAB — URINE CULTURE

## 2020-03-13 LAB — HEPATITIS A ANTIBODY, TOTAL: hep A Total Ab: REACTIVE — AB

## 2020-03-13 MED ORDER — ENSURE ENLIVE PO LIQD: 237.0000 mL | Freq: Two times a day (BID) | ORAL | Status: DC

## 2020-03-13 MED ORDER — ADULT MULTIVITAMIN W/MINERALS CH
1.0000 | ORAL_TABLET | Freq: Every day | ORAL | Status: DC
  Administered 2020-03-14 – 2020-04-22 (×39): 1 via ORAL
  Filled 2020-03-13 (×39): qty 1

## 2020-03-13 NOTE — TOC Initial Note (Signed)
Transition of Care [facility]) - Initial/Assessment Note    Patient Details  Name: Robert Kramer MRN: [id number] Date of Birth: 1975-07-12  Transition of Care [facility]) CM/SW Contact:    Lanier Clam, RN Phone Number: 03/13/2020, 3:54 PM  Clinical Narrative: CM referral for SA resources-Patient agreeable to Safe needle exchange resource, & otpt SA resource provided. Patient works. Will need to contact [facility] for pcp appt.                  Expected Discharge Plan: Home/Self Care Barriers to Discharge: Continued Medical Work up   Patient Goals and CMS Choice Patient states their goals for this hospitalization and ongoing recovery are:: go home CMS Medicare.gov Compare Post Acute Care list provided to:: Patient Choice offered to / list presented to : NA  Expected Discharge Plan and Services Expected Discharge Plan: Home/Self Care   Discharge Planning Services: CM Consult   Living arrangements for the past 2 months: Single Family Home                                      Prior Living Arrangements/Services Living arrangements for the past 2 months: Single Family Home Lives with:: Siblings Patient language and need for interpreter reviewed:: Yes Do you feel safe going back to the place where you live?: Yes      Need for Family Participation in Patient Care: No (Comment) Care giver support system in place?: Yes (comment)   Criminal Activity/Legal Involvement Pertinent to Current Situation/Hospitalization: No - Comment as needed  Activities of Daily Living Home Assistive Devices/Equipment: None ADL Screening (condition at time of admission) Patient's cognitive ability adequate to safely complete daily activities?: Yes Is the patient deaf or have difficulty hearing?: No Does the patient have difficulty seeing, even when wearing glasses/contacts?: No Does the patient have difficulty concentrating, remembering, or making decisions?: No Patient able to express need for assistance  with ADLs?: Yes Does the patient have difficulty dressing or bathing?: No Independently performs ADLs?: No Communication: Independent Dressing (OT): Independent Grooming: Independent Feeding: Independent Bathing: Independent Toileting: Dependent Is this a change from baseline?: Change from baseline, expected to last >3days In/Out Bed: Dependent Is this a change from baseline?: Change from baseline, expected to last >3 days Walks in Home: Dependent Is this a change from baseline?: Change from baseline, expected to last >3 days Does the patient have difficulty walking or climbing stairs?: Yes (secondary to weakness) Weakness of Legs: Both Weakness of Arms/Hands: None  Permission Sought/Granted Permission sought to share information with : Case Manager Permission granted to share information with : Yes, Verbal Permission Granted  Share Information with NAME: Case manager           Emotional Assessment Appearance:: Appears stated age Attitude/Demeanor/Rapport: Gracious Affect (typically observed): Accepting Orientation: : Oriented to Self, Oriented to Place, Oriented to  Time, Oriented to Situation Alcohol / Substance Use: Illicit Drugs, Tobacco Use, Alcohol Use Psych Involvement: No (comment)  Admission diagnosis:  Heroin abuse (HCC) [F11.10] Severe sepsis due to methicillin resistant Staphylococcus aureus (MRSA) with acute organ dysfunction (HCC) [A41.02, R65.20] Sepsis due to methicillin susceptible Staphylococcus aureus (MSSA) without acute organ dysfunction (HCC) [A41.01] Patient Active Problem List   Diagnosis Date Noted  . Severe sepsis due to methicillin resistant Staphylococcus aureus (MRSA) with acute organ dysfunction (HCC) 03/11/2020  . Hypokalemia 03/11/2020  . Thrombocytopenia (HCC) 03/11/2020  . Serratia septicemia (HCC)  03/11/2020  . Bipolar 1 disorder (HCC)   . Polysubstance dependence (HCC) 04/15/2013  . Drug-seeking behavior 04/15/2013   PCP:  Patient, No  Pcp Per Pharmacy:   [facility] [address] [place] - [address] [place] [address] Phone: [phone] Fax: [phone]     Social Determinants of Health (SDOH) Interventions    Readmission Risk Interventions No flowsheet data found.

## 2020-03-13 NOTE — Progress Notes (Signed)
Initial Nutrition Assessment  INTERVENTION:   -Ensure Enlive po BID, each supplement provides 350 kcal and 20 grams of protein -Multivitamin with minerals daily  NUTRITION DIAGNOSIS:   Increased nutrient needs related to acute illness (sepsis) as evidenced by estimated needs.  GOAL:   Patient will meet greater than or equal to 90% of their needs  MONITOR:   PO intake, Supplement acceptance, Labs, Weight trends, I & O's  REASON FOR ASSESSMENT:   Consult Assessment of nutrition requirement/status  ASSESSMENT:   44 y.o. male with medical history significant for polysubstance use disorder including injection drug use and bipolar disorder who presents to the ED for evaluation of abnormal blood cultures.  Patient unavailable at time of visit, receiving patient care.  Per chart review, pt with history of heroin IVDU (last used 3 days PTA). Pt with increased needs d/t pneumonia and sepsis. Pt is also showing signs of withdrawal (including diarrhea and vomiting).  Would benefit from protein supplements. Will order Ensure and daily MVI.  Per weight records, last recorded weight PTA is from 8/31 which was 129 lbs. Current weight: 156 lbs.  Will continue to monitor weight trends.  Medications reviewed. Labs reviewed: Low Na  NUTRITION - FOCUSED PHYSICAL EXAM:  Unable to complete  Diet Order:   Diet Order            Diet regular Room service appropriate? Yes; Fluid consistency: Thin  Diet effective now                 EDUCATION NEEDS:   No education needs have been identified at this time  Skin:  Skin Assessment: Reviewed RN Assessment  Last BM:  9/7  Height:   Ht Readings from Last 1 Encounters:  03/11/20 5\' 11"  (1.803 m)    Weight:   Wt Readings from Last 1 Encounters:  03/11/20 70.8 kg   BMI:  Body mass index is 21.76 kg/m.  Estimated Nutritional Needs:   Kcal:  1800-2000  Protein:  85-100g  Fluid:  2L/day   05/11/20, MS, RD,  LDN Inpatient Clinical Dietitian Contact information available via Amion

## 2020-03-13 NOTE — Progress Notes (Signed)
Chaplain Medinas-Lockley attempted visit with pt Robert Kramer. Pt appeared to be sleeping at time of visit. Chaplain did not engage with pt at this time and spoke with RN. RN confirmed that she had Chaplain's pager and would notify when would be a good time to follow-up.  Chaplain remains available for follow-up spiritual and emotional support as needed.  Ardath Sax, MDiv    03/13/20 1300  Clinical Encounter Type  Referral From Physician

## 2020-03-13 NOTE — Progress Notes (Signed)
Pharmacy Antibiotic Note  Robert Kramer is a 44 y.o. 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 Pharmacy has been consulted for Vancomycin dosing. SCr yesterday remained stable and low at 0.52. Vancomycin trough this morning resulted at 16 and was drawn about 8 hours after the previous dose. This level is therapeutic.    Plan: Continue vancomycin 1000 mg Q 8 hours  Continue cefepime 2 gm q 8 hours for serratia  Monitor renal function, TEE Re-check vancomycin trough as appropriate  Height: 5\' 11"  (180.3 cm) Weight: 70.8 kg (156 lb) IBW/kg (Calculated) : 75.3  Temp (24hrs), Avg:99.9 F (37.7 C), Min:99.3 F (37.4 C), Max:100.4 F (38 C)  Recent Labs  Lab 03/11/20 1536 03/11/20 1830 03/11/20 2155 03/12/20 0400 03/13/20 0809  WBC 13.7*  --   --  12.6*  --   CREATININE 0.81  --   --  0.52*  --   LATICACIDVEN 2.0* 2.5* 2.0* 1.2  --   VANCOTROUGH  --   --   --   --  16    Estimated Creatinine Clearance: 115.5 mL/min (A) (by C-G formula based on SCr of 0.52 mg/dL (L)).    Allergies  Allergen Reactions  . Tramadol Other (See Comments)    Upset stomach    Antimicrobials this admission: 9/6 Vancomycin >>   Dose adjustments this admission:  Microbiology results: 9/6 BCx X 1 : Staph aureus  9/1 BCx x1: MRSA 9/1 BCx x1: Serratia Marcescens (S=Rocephin, Bactrim, Cipro; R=Cefazolin)  Thank you for allowing pharmacy to be a part of this patient's care.  11/1, PharmD, BCPS, BCIDP Infectious Diseases Clinical Pharmacist Phone: [phone] 03/13/2020 10:46 AM

## 2020-03-13 NOTE — Progress Notes (Signed)
Subjective: Diffuse pain though worse in left shoulder    Antibiotics:  Anti-infectives (From admission, onward)   Start     Dose/Rate Route Frequency Ordered Stop   03/12/20 2000  cefTRIAXone (ROCEPHIN) 2 g in sodium chloride 0.9 % 100 mL IVPB  Status:  Discontinued        2 g 200 mL/hr over 30 Minutes Intravenous Every 24 hours 03/11/20 2058 03/12/20 0937   03/12/20 1400  ceFEPIme (MAXIPIME) 2 g in sodium chloride 0.9 % 100 mL IVPB        2 g 200 mL/hr over 30 Minutes Intravenous Every 8 hours 03/12/20 0937     03/12/20 0000  vancomycin (VANCOCIN) IVPB 1000 mg/200 mL premix        1,000 mg 200 mL/hr over 60 Minutes Intravenous Every 8 hours 03/11/20 1816     03/11/20 1830  cefTRIAXone (ROCEPHIN) 2 g in sodium chloride 0.9 % 100 mL IVPB        2 g 200 mL/hr over 30 Minutes Intravenous  Once 03/11/20 1822 03/11/20 2030   03/11/20 1545  vancomycin (VANCOREADY) IVPB 1500 mg/300 mL        1,500 mg 150 mL/hr over 120 Minutes Intravenous  Once 03/11/20 1532 03/11/20 1819      Medications: Scheduled Meds: . buprenorphine-naloxone  1 tablet Sublingual BID  . feeding supplement (ENSURE ENLIVE)  237 mL Oral BID BM  . multivitamin with minerals  1 tablet Oral Daily  . sodium chloride flush  3 mL Intravenous Q12H   Continuous Infusions: . ceFEPime (MAXIPIME) IV 2 g (03/13/20 1327)  . vancomycin 1,000 mg (03/13/20 0901)   PRN Meds:.acetaminophen **OR** acetaminophen, hydrOXYzine, loperamide, ondansetron **OR** ondansetron (ZOFRAN) IV    Objective: Weight change:   Intake/Output Summary (Last 24 hours) at 03/13/2020 1637 Last data filed at 03/13/2020 1500 Gross per 24 hour  Intake 1341.06 ml  Output 1025 ml  Net 316.06 ml   Blood pressure 108/73, pulse (!) 126, temperature 97.7 F (36.5 C), temperature source Oral, resp. rate 14, height 5\' 11"  (1.803 m), weight 70.8 kg, SpO2 94 %. Temp:  [97.7 F (36.5 C)-100.4 F (38 C)] 97.7 F (36.5 C) (09/08 1258) Pulse Rate:   [121-137] 126 (09/08 1258) Resp:  [14-35] 14 (09/08 1258) BP: (101-114)/(68-79) 108/73 (09/08 1258) SpO2:  [93 %-95 %] 94 % (09/08 1258)  Physical Exam: General: Alert and awake, oriented x3 HEENT: anicteric sclera, EOMI CVS tahcycardic Chest: , no wheezing, no respiratory distress Abdomen: soft non-distended,  Extremities:tendenress on left shoulder and sig limied rom Skin: no rashes Neuro: nonfocal  CBC:    BMET Recent Labs    03/11/20 1536 03/12/20 0400  NA 134* 134*  K 3.1* 4.4  CL 101 102  CO2 24 26  GLUCOSE 141* 114*  BUN 21* 20  CREATININE 0.81 0.52*  CALCIUM 7.9* 7.8*     Liver Panel  Recent Labs    03/11/20 1536 03/12/20 0400  PROT 6.5 5.5*  ALBUMIN 1.7* 1.3*  AST 67* 56*  ALT 32 28  ALKPHOS 76 63  BILITOT 1.5* 1.0       Sedimentation Rate No results for input(s): ESRSEDRATE in the last 72 hours. C-Reactive Protein No results for input(s): CRP in the last 72 hours.  Micro Results: Recent Results (from the past 720 hour(s))  Blood culture (routine x 2)     Status: Abnormal   Collection Time: 03/06/20  6:34 AM   Specimen:  BLOOD RIGHT FOREARM  Result Value Ref Range Status   Specimen Description BLOOD RIGHT FOREARM  Final   Special Requests   Final    BOTTLES DRAWN AEROBIC AND ANAEROBIC Blood Culture adequate volume   Culture  Setup Time   Final    GRAM NEGATIVE RODS AEROBIC BOTTLE ONLY CRITICAL RESULT CALLED TO, READ BACK BY AND VERIFIED WITH: S COBLE RN 1831 03/08/20 A BROWNING GRAM POSITIVE COCCI IN CLUSTERS ANAEROBIC BOTTLE ONLY CRITICAL VALUE NOTED.  VALUE IS CONSISTENT WITH PREVIOUSLY REPORTED AND CALLED VALUE. Performed at [facility], [address] [place], [place] [address]    Culture SERRATIA MARCESCENS (A)  Final   Report Status 03/10/2020 FINAL  Final   Organism ID, Bacteria SERRATIA MARCESCENS  Final      Susceptibility   Serratia marcescens - MIC*    CEFAZOLIN >=64 RESISTANT Resistant     CEFEPIME <=0.12  SENSITIVE Sensitive     CEFTAZIDIME <=1 SENSITIVE Sensitive     CEFTRIAXONE <=0.25 SENSITIVE Sensitive     CIPROFLOXACIN <=0.25 SENSITIVE Sensitive     GENTAMICIN <=1 SENSITIVE Sensitive     TRIMETH/SULFA <=20 SENSITIVE Sensitive     * SERRATIA MARCESCENS  Blood Culture ID Panel (Reflexed)     Status: Abnormal   Collection Time: 03/06/20  6:34 AM  Result Value Ref Range Status   Enterococcus faecalis NOT DETECTED NOT DETECTED Final   Enterococcus Faecium NOT DETECTED NOT DETECTED Final   Listeria monocytogenes NOT DETECTED NOT DETECTED Final   Staphylococcus species NOT DETECTED NOT DETECTED Final   Staphylococcus aureus (BCID) NOT DETECTED NOT DETECTED Final   Staphylococcus epidermidis NOT DETECTED NOT DETECTED Final   Staphylococcus lugdunensis NOT DETECTED NOT DETECTED Final   Streptococcus species NOT DETECTED NOT DETECTED Final   Streptococcus agalactiae NOT DETECTED NOT DETECTED Final   Streptococcus pneumoniae NOT DETECTED NOT DETECTED Final   Streptococcus pyogenes NOT DETECTED NOT DETECTED Final   A.calcoaceticus-baumannii NOT DETECTED NOT DETECTED Final   Bacteroides fragilis NOT DETECTED NOT DETECTED Final   Enterobacterales DETECTED (A) NOT DETECTED Final    Comment: Enterobacterales represent a large order of gram negative bacteria, not a single organism. CRITICAL RESULT CALLED TO, READ BACK BY AND VERIFIED WITH: S COBLE RN [phone] 03/08/20 A BROWNING    Enterobacter cloacae complex NOT DETECTED NOT DETECTED Final   Escherichia coli NOT DETECTED NOT DETECTED Final   Klebsiella aerogenes NOT DETECTED NOT DETECTED Final   Klebsiella oxytoca NOT DETECTED NOT DETECTED Final   Klebsiella pneumoniae NOT DETECTED NOT DETECTED Final   Proteus species NOT DETECTED NOT DETECTED Final   Salmonella species NOT DETECTED NOT DETECTED Final   Serratia marcescens DETECTED (A) NOT DETECTED Final    Comment: CRITICAL RESULT CALLED TO, READ BACK BY AND VERIFIED WITH: S COBLE RN 1831 03/08/20  A BROWNING    Haemophilus influenzae NOT DETECTED NOT DETECTED Final   Neisseria meningitidis NOT DETECTED NOT DETECTED Final   Pseudomonas aeruginosa NOT DETECTED NOT DETECTED Final   Stenotrophomonas maltophilia NOT DETECTED NOT DETECTED Final   Candida albicans NOT DETECTED NOT DETECTED Final   Candida auris NOT DETECTED NOT DETECTED Final   Candida glabrata NOT DETECTED NOT DETECTED Final   Candida krusei NOT DETECTED NOT DETECTED Final   Candida parapsilosis NOT DETECTED NOT DETECTED Final   Candida tropicalis NOT DETECTED NOT DETECTED Final   Cryptococcus neoformans/gattii NOT DETECTED NOT DETECTED Final   CTX-M ESBL NOT DETECTED NOT DETECTED Final   Carbapenem  resistance IMP NOT DETECTED NOT DETECTED Final   Carbapenem resistance KPC NOT DETECTED NOT DETECTED Final   Carbapenem resistance NDM NOT DETECTED NOT DETECTED Final   Carbapenem resist OXA 48 LIKE NOT DETECTED NOT DETECTED Final   Carbapenem resistance VIM NOT DETECTED NOT DETECTED Final    Comment: Performed at [facility], [address] [place], [place] [address]  Blood culture (routine x 2)     Status: Abnormal   Collection Time: 03/06/20  6:44 AM   Specimen: BLOOD  Result Value Ref Range Status   Specimen Description BLOOD RIGHT ANTECUBITAL  Final   Special Requests   Final    BOTTLES DRAWN AEROBIC AND ANAEROBIC Blood Culture adequate volume   Culture  Setup Time   Final    IN BOTH AEROBIC AND ANAEROBIC BOTTLES GRAM POSITIVE COCCI IN CLUSTERS CRITICAL RESULT CALLED TO, READ BACK BY AND VERIFIED WITH: L ADKINS RN 03/09/20 0219 JDW Performed at [facility], [address] [place], [place] [address]    Culture METHICILLIN RESISTANT STAPHYLOCOCCUS AUREUS (A)  Final   Report Status 03/10/2020 FINAL  Final   Organism ID, Bacteria METHICILLIN RESISTANT STAPHYLOCOCCUS AUREUS  Final      Susceptibility   Methicillin resistant staphylococcus aureus - MIC*    CIPROFLOXACIN <=0.5 SENSITIVE Sensitive      ERYTHROMYCIN >=8 RESISTANT Resistant     GENTAMICIN <=0.5 SENSITIVE Sensitive     OXACILLIN >=4 RESISTANT Resistant     TETRACYCLINE <=1 SENSITIVE Sensitive     VANCOMYCIN 1 SENSITIVE Sensitive     TRIMETH/SULFA <=10 SENSITIVE Sensitive     CLINDAMYCIN <=0.25 SENSITIVE Sensitive     RIFAMPIN <=0.5 SENSITIVE Sensitive     Inducible Clindamycin NEGATIVE Sensitive     * METHICILLIN RESISTANT STAPHYLOCOCCUS AUREUS  Blood Culture ID Panel (Reflexed)     Status: Abnormal   Collection Time: 03/06/20  6:44 AM  Result Value Ref Range Status   Enterococcus faecalis NOT DETECTED NOT DETECTED Final   Enterococcus Faecium NOT DETECTED NOT DETECTED Final   Listeria monocytogenes NOT DETECTED NOT DETECTED Final   Staphylococcus species DETECTED (A) NOT DETECTED Final    Comment: CRITICAL RESULT CALLED TO, READ BACK BY AND VERIFIED WITH: L ADKINS RN 03/09/20 0208 JDW    Staphylococcus aureus (BCID) DETECTED (A) NOT DETECTED Final    Comment: Methicillin (oxacillin)-resistant Staphylococcus aureus (MRSA). MRSA is predictably resistant to beta-lactam antibiotics (except ceftaroline). Preferred therapy is vancomycin unless clinically contraindicated. Patient requires contact precautions if  hospitalized. CRITICAL RESULT CALLED TO, READ BACK BY AND VERIFIED WITH: L ADKINS RN 03/09/20 0208 JDW    Staphylococcus epidermidis NOT DETECTED NOT DETECTED Final   Staphylococcus lugdunensis NOT DETECTED NOT DETECTED Final   Streptococcus species NOT DETECTED NOT DETECTED Final   Streptococcus agalactiae NOT DETECTED NOT DETECTED Final   Streptococcus pneumoniae NOT DETECTED NOT DETECTED Final   Streptococcus pyogenes NOT DETECTED NOT DETECTED Final   A.calcoaceticus-baumannii NOT DETECTED NOT DETECTED Final   Bacteroides fragilis NOT DETECTED NOT DETECTED Final   Enterobacterales NOT DETECTED NOT DETECTED Final   Enterobacter cloacae complex NOT DETECTED NOT DETECTED Final   Escherichia coli NOT DETECTED NOT  DETECTED Final   Klebsiella aerogenes NOT DETECTED NOT DETECTED Final   Klebsiella oxytoca NOT DETECTED NOT DETECTED Final   Klebsiella pneumoniae NOT DETECTED NOT DETECTED Final   Proteus species NOT DETECTED NOT DETECTED Final   Salmonella species NOT DETECTED NOT DETECTED Final   Serratia marcescens NOT  DETECTED NOT DETECTED Final   Haemophilus influenzae NOT DETECTED NOT DETECTED Final   Neisseria meningitidis NOT DETECTED NOT DETECTED Final   Pseudomonas aeruginosa NOT DETECTED NOT DETECTED Final   Stenotrophomonas maltophilia NOT DETECTED NOT DETECTED Final   Candida albicans NOT DETECTED NOT DETECTED Final   Candida auris NOT DETECTED NOT DETECTED Final   Candida glabrata NOT DETECTED NOT DETECTED Final   Candida krusei NOT DETECTED NOT DETECTED Final   Candida parapsilosis NOT DETECTED NOT DETECTED Final   Candida tropicalis NOT DETECTED NOT DETECTED Final   Cryptococcus neoformans/gattii NOT DETECTED NOT DETECTED Final   Meth resistant mecA/C and MREJ DETECTED (A) NOT DETECTED Final    Comment: CRITICAL RESULT CALLED TO, READ BACK BY AND VERIFIED WITH: L ADKINS RN 03/09/20 0208 JDW Performed at [facility], [address], [place], [place] [address]   Urine culture     Status: Abnormal   Collection Time: 03/11/20  3:28 PM   Specimen: In/Out Cath Urine  Result Value Ref Range Status   Specimen Description   Final    IN/OUT CATH URINE Performed at [facility], [address], [place], [place] [address]    Special Requests   Final    NONE Performed at [facility], [address] [place], [place] [address]    Culture MULTIPLE SPECIES PRESENT, SUGGEST RECOLLECTION (A)  Final   Report Status 03/13/2020 FINAL  Final  Blood culture (routine x 2)     Status: Abnormal (Preliminary result)   Collection Time: 03/11/20  3:31 PM   Specimen: BLOOD  Result Value Ref Range Status   Specimen Description   Final    BLOOD LEFT  ARM Performed at [facility], [address], [place], [place] [address]    Special Requests   Final    BOTTLES DRAWN AEROBIC AND ANAEROBIC Blood Culture results may not be optimal due to an excessive volume of blood received in culture bottles Performed at [facility], [address], [place], [place] [address]    Culture  Setup Time   Final    GRAM POSITIVE COCCI IN CLUSTERS IN BOTH AEROBIC AND ANAEROBIC BOTTLES CRITICAL VALUE NOTED.  VALUE IS CONSISTENT WITH PREVIOUSLY REPORTED AND CALLED VALUE.    Culture (A)  Final    STAPHYLOCOCCUS AUREUS SUSCEPTIBILITIES PERFORMED ON PREVIOUS CULTURE WITHIN THE LAST 5 DAYS. Performed at [facility], [address] [place], [place] [address]    Report Status PENDING  Incomplete  SARS Coronavirus 2 by RT PCR (hospital order, performed in [facility] hospital lab) Nasopharyngeal Nasopharyngeal Swab     Status: None   Collection Time: 03/11/20  5:05 PM   Specimen: Nasopharyngeal Swab  Result Value Ref Range Status   SARS Coronavirus 2 NEGATIVE NEGATIVE Final    Comment: (NOTE) SARS-CoV-2 target nucleic acids are NOT DETECTED.  The SARS-CoV-2 RNA is generally detectable in upper and lower respiratory specimens during the acute phase of infection. The lowest concentration of SARS-CoV-2 viral copies this assay can detect is 250 copies / mL. A negative result does not preclude SARS-CoV-2 infection and should not be used as the sole basis for treatment or other patient management decisions.  A negative result may occur with improper specimen collection / handling, submission of specimen other than nasopharyngeal swab, presence of viral mutation(s) within the areas targeted by this assay, and inadequate number of viral copies (<250 copies / mL). A negative result  must be combined with clinical observations, patient history, and epidemiological information.  Fact Sheet for Patients:    BoilerBrush.com.cy  Fact Sheet for Healthcare Providers: https://pope.com/  This test is not yet approved or  cleared by the [place] FDA and has been authorized for detection and/or diagnosis of SARS-CoV-2 by FDA under an Emergency Use Authorization (EUA).  This EUA will remain in effect (meaning this test can be used) for the duration of the COVID-19 declaration under Section 564(b)(1) of the Act, 21 U.S.C. section 360bbb-3(b)(1), unless the authorization is terminated or revoked sooner.  Performed at [facility], [address], [place], [place] [address]     Studies/Results: CT Angio Chest PE W/Cm &/Or Wo Cm  Result Date: 03/11/2020 CLINICAL DATA:  Sepsis. Diffuse body pain. Concern for septic emboli on a portable chest earlier today. EXAM: CT ANGIOGRAPHY CHEST WITH CONTRAST TECHNIQUE: Multidetector CT imaging of the chest was performed using the standard protocol during bolus administration of intravenous contrast. Multiplanar CT image reconstructions and MIPs were obtained to evaluate the vascular anatomy. CONTRAST:  OMNIPAQUE IOHEXOL 350 MG/ML SOLN COMPARISON:  Portable chest obtained earlier today. Chest, abdomen and pelvis CT dated 04/20/2014. FINDINGS: Cardiovascular: Satisfactory opacification of the pulmonary arteries to the segmental level. No evidence of pulmonary embolism. Normal heart size. No pericardial effusion. Mediastinum/Nodes: Mildly enlarged bilateral hilar lymph nodes. These include a 15 mm short axis node on the right on image number 68 series 5 and 13 mm short axis node on the left on image number 74 series 5. No enlarged mediastinal or axillary nodes. Unremarkable thyroid gland. Unremarkable esophagus. Lungs/Pleura: Multiple cavitary nodules of varying sizes throughout both lungs. Some of these contain air-fluid levels. There are also multiple non cavitary nodules in both lungs with poorly  defined margins. Also demonstrated is patchy airspace opacity in the posterior aspects of both lower lobes, greater on the right. No pleural fluid. Upper Abdomen: There is a suggestion of hepatosplenomegaly, neither included in their entirety. Musculoskeletal: Interval approximately 20% T6 superior endplate compression deformity with mild Schmorl's node formation. No acute fracture lines or bony retropulsion. Review of the MIP images confirms the above findings. IMPRESSION: 1. No bland pulmonary emboli. 2. Multiple cavitary and non cavitary nodules of varying sizes throughout both lungs, compatible with septic emboli. 3. Bilateral lower lobe pneumonia. 4. Mild bilateral hilar adenopathy, most likely reactive. 5. Probable hepatosplenomegaly, neither included in their entirety. Electronically Signed   By: Beckie Salts M.D.   On: 03/11/2020 18:38   ECHOCARDIOGRAM COMPLETE  Result Date: 03/12/2020    ECHOCARDIOGRAM REPORT   Patient Name:   Robert Kramer Date of Exam: 03/12/2020 Medical Rec #:  [id number]     Height:       71.0 in Accession #:    [id number]    Weight:       156.0 lb Date of Birth:  June 17, 1976     BSA:          1.897 m Patient Age:    44 years      BP:           112/75 mmHg Patient Gender: M             HR:           115 bpm. Exam Location:  Inpatient Procedure: 2D Echo Indications:    bacteremia  History:        Patient has no prior history of Echocardiogram examinations.  Risk Factors:Current Smoker. Polysubstance abuse.  Sonographer:    Celene Skeen RDCS (AE) Referring Phys: [id number] VISHAL R PATEL IMPRESSIONS  1. Left ventricular ejection fraction, by estimation, is 60 to 65%. The left ventricle has normal function. The left ventricle has no regional wall motion abnormalities. Left ventricular diastolic function could not be evaluated.  2. Right ventricular systolic function is normal. The right ventricular size is normal. There is normal pulmonary artery systolic pressure. The  estimated right ventricular systolic pressure is 31.5 mmHg.  3. The mitral valve is normal in structure. No evidence of mitral valve regurgitation. No evidence of mitral stenosis.  4. There are several large shaggy mobile densities one of which measures 1.27 x 1.54cm on the TV worrisome for vegetations. The tricuspid valve is abnormal.  5. The aortic valve is normal in structure. Aortic valve regurgitation is not visualized. No aortic stenosis is present.  6. The inferior vena cava is normal in size with greater than 50% respiratory variability, suggesting right atrial pressure of 3 mmHg. 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 FINDINGS  Left Ventricle: Left ventricular ejection fraction, by estimation, is 60 to 65%. The left ventricle has normal function. The left ventricle has no regional wall motion abnormalities. The left ventricular internal cavity size was normal in size. There is  no left ventricular hypertrophy. Left ventricular diastolic function could not be evaluated. Right Ventricle: The right ventricular size is normal. No increase in right ventricular wall thickness. Right ventricular systolic function is normal. There is normal pulmonary artery systolic pressure. The tricuspid regurgitant velocity is 2.67 m/s, and  with an assumed right atrial pressure of 3 mmHg, the estimated right ventricular systolic pressure is 31.5 mmHg. Left Atrium: Left atrial size was normal in size. Right Atrium: Right atrial size was normal in size. Pericardium: A small pericardial effusion is present. The pericardial effusion is circumferential. There is no evidence of cardiac tamponade. Mitral Valve: The mitral valve is normal in structure. There is mild thickening of the mitral valve leaflet(s). Normal mobility of the mitral valve leaflets. No evidence of mitral valve regurgitation. No evidence of mitral valve  stenosis. Tricuspid Valve: There are several large shaggy mobile densities one of which measures 1.27 x 1.54cm on the TV worrisome for vegetations. The tricuspid valve is abnormal. Tricuspid valve regurgitation is mild . No evidence of tricuspid stenosis. Aortic Valve: The aortic valve is normal in structure. Aortic valve regurgitation is not visualized. No aortic stenosis is present. Pulmonic Valve: The pulmonic valve was normal in structure. Pulmonic valve regurgitation is not visualized. No evidence of pulmonic stenosis. Aorta: The aortic root is normal in size and structure. Venous: The inferior vena cava is normal in size with greater than 50% respiratory variability, suggesting right atrial pressure of 3 mmHg. IAS/Shunts: No atrial level shunt detected by color flow Doppler.  LEFT VENTRICLE PLAX 2D LVIDd:         4.00 cm  Diastology LVIDs:         2.90 cm  LV e' lateral: 12.00 cm/s LV PW:         0.90 cm LV IVS:        0.70 cm LVOT diam:     1.90 cm LV SV:         38 LV SV Index:   20 LVOT Area:     2.84 cm  LEFT ATRIUM         Index LA diam:  2.60 cm 1.37 cm/m  AORTIC VALVE LVOT Vmax:   82.20 cm/s LVOT Vmean:  65.800 cm/s LVOT VTI:    0.133 m  AORTA Ao Root diam: 2.40 cm TRICUSPID VALVE TR Peak grad:   28.5 mmHg TR Vmax:        267.00 cm/s  SHUNTS Systemic VTI:  0.13 m Systemic Diam: 1.90 cm Armanda Magic MD Electronically signed by Armanda Magic MD Signature Date/Time: 03/12/2020/12:25:39 PM    Final       Assessment/Plan:  INTERVAL HISTORY: left and right shoulder pain and LBP   Principal Problem:   Severe sepsis due to methicillin resistant Staphylococcus aureus (MRSA) with acute organ dysfunction (HCC) Active Problems:   Polysubstance dependence (HCC)   Hypokalemia   Thrombocytopenia (HCC)   Serratia septicemia (HCC)   Bipolar 1 disorder (HCC)    Robert Kramer is a 44 y.o. 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   LOS: 2 days   Acey Lav 03/13/2020, 4:37 PM

## 2020-03-13 NOTE — Progress Notes (Signed)
PROGRESS NOTE  Robert Kramer PJK:[id number] DOB: 1976/01/31 DOA: 03/11/2020 PCP: Patient, No Pcp Per  Brief History   Robert Kramer is a 44 y.o. male with medical history significant for polysubstance use disorder including injection drug use and bipolar disorder who presents to the ED for evaluation of abnormal blood cultures.   Patient was seen in the [place] long ED 03/06/2020 for evaluation and management of heroin withdrawal.  He was treated supportively.  Blood cultures were obtained and have now grown out MRSA and Serratia marcescens.  He was called today to come to the ED for further evaluation.   Patient states he is an IV drug user injecting heroin at any site in his arms or hands that he can gain access.  He reports last use 3 days ago.  He has been having ongoing fevers, chills, diaphoresis, shortness of breath, nonproductive cough, nausea, abdominal pain, and generally feeling unwell.  He feels as if he is about to go into opioid withdrawal.  He says he has used Suboxone in the past with benefit.  He denies any other illicit drug use or alcohol use.   ED Course:  Initial vitals showed BP 121/73, pulse 125, RR 24, temp 98.1 Fahrenheit, SPO2 98% on room air.   Labs show WBC 13.7, hemoglobin 10.7, platelets 32,000 (51,000 on 03/05/2020), sodium 134, potassium 3.1, bicarb 24, BUN 21, creatinine 0.81, serum glucose 141, AST 67, ALT 32, alk phos 76, total bilirubin 1.5, lactic acid 2.0 >> 2.5.   Blood and urine cultures were obtained and pending.  SARS-CoV-2 PCR is negative.   Portable chest x-ray shows multiple areas of hazy airspace opacities with cavitation in the inferior right upper lobe.   CTA chest PE study shows multiple cavitary and noncavitary nodules consistent with septic emboli, bilateral lower lobe pneumonia, mild bilateral hilar adenopathy felt to be reactive.  No plan for pulmonary emboli reported.   Patient was given 2.25 L LR, oral K 40 mEq, IV Ativan 1 mg once, and started  on IV vancomycin and ceftriaxone.  The hospitalist service was consulted to admit for further evaluation and management.  TTE was performed on 03/12/2020. It has demonstratred "shaggy" vegetations on the tricuspid valve. TEE was recommended to further clarify the problem.  Consultants  . Infectious Disease  Procedures  . None  Antibiotics   Anti-infectives (From admission, onward)   Start     Dose/Rate Route Frequency Ordered Stop   03/12/20 2000  cefTRIAXone (ROCEPHIN) 2 g in sodium chloride 0.9 % 100 mL IVPB  Status:  Discontinued        2 g 200 mL/hr over 30 Minutes Intravenous Every 24 hours 03/11/20 2058 03/12/20 0937   03/12/20 1400  ceFEPIme (MAXIPIME) 2 g in sodium chloride 0.9 % 100 mL IVPB        2 g 200 mL/hr over 30 Minutes Intravenous Every 8 hours 03/12/20 0937     03/12/20 0000  vancomycin (VANCOCIN) IVPB 1000 mg/200 mL premix        1,000 mg 200 mL/hr over 60 Minutes Intravenous Every 8 hours 03/11/20 1816     03/11/20 1830  cefTRIAXone (ROCEPHIN) 2 g in sodium chloride 0.9 % 100 mL IVPB        2 g 200 mL/hr over 30 Minutes Intravenous  Once 03/11/20 1822 03/11/20 2030   03/11/20 1545  vancomycin (VANCOREADY) IVPB 1500 mg/300 mL        1,500 mg 150 mL/hr over 120 Minutes Intravenous  Once 03/11/20 1532 03/11/20 1819     Subjective  The patient is lying in bed. He states that he feels terrible. No new complaints.  Objective   Vitals:  Vitals:   03/13/20 1048 03/13/20 1258  BP: 101/68 108/73  Pulse: (!) 125 (!) 126  Resp: 18 14  Temp: 99.2 F (37.3 C) 97.7 F (36.5 C)  SpO2: 95% 94%   Exam:  Constitutional:  . The patient is awake, alert, and oriented x 3. He is in moderate distress from myalgias. Respiratory:  . No increased work of breathing. . No wheezes, rales, or rhonchi . No tactile fremitus Cardiovascular:  . Regular rate and rhythm . No murmurs, ectopy, or gallups. . No lateral PMI. No thrills. Abdomen:  . Abdomen is soft, non-tender,  non-distended . No hernias, masses, or organomegaly . Normoactive bowel sounds.  Musculoskeletal:  . No cyanosis, clubbing, or edema Skin:  . No rashes, lesions, ulcers . palpation of skin: no induration or nodules Neurologic:  . CN 2-12 intact . Sensation all 4 extremities intact Psychiatric:  . Mental status o Mood, affect appropriate o Orientation to person, place, time  . judgment and insight appear intact  I have personally reviewed the following:   Today's Data  . Vitals, CMP, Lactic acid, CBC  Micro Data  . Blood Cultures x 2: Serratia Marsescens, MRSA   Cardiology Data  . TTE: Tricuspid vegetations . TEE: Pending.  Scheduled Meds: . buprenorphine-naloxone  1 tablet Sublingual BID  . feeding supplement (ENSURE ENLIVE)  237 mL Oral BID BM  . multivitamin with minerals  1 tablet Oral Daily  . sodium chloride flush  3 mL Intravenous Q12H   Continuous Infusions: . ceFEPime (MAXIPIME) IV 2 g (03/13/20 1327)  . vancomycin 1,000 mg (03/13/20 0901)    Principal Problem:   Severe sepsis due to methicillin resistant Staphylococcus aureus (MRSA) with acute organ dysfunction (HCC) Active Problems:   Polysubstance dependence (HCC)   Hypokalemia   Thrombocytopenia (HCC)   Serratia septicemia (HCC)   Bipolar 1 disorder (HCC)   LOS: 2 days   A & P  Severe sepsis due to MRSA and Serratia bacteremia with pulmonary septic emboli and pneumonia, bacteremia and tricuspid endocarditis: Presenting with tachypnea with RR up to 32, tachycardia up to 125, and evidence of endorgan damage with platelet count of 32,000.  Blood cultures from 03/06/2020 grew out MRSA and Serratia.  CT chest shows multiple cavitary and noncavitary septic emboli and bilateral lower lobe pneumonia.  He has been started on appropriate antibiotics. The patient has been continued on IV vancomycin to treat MRSA and IV ceftriaxone to treat Serratia. Echocardiogram demonstrated tricuspid vegetations. TEE is pending.  Repeat blood blood culture was collected on 9/6. It has grown out MRSA.  Endocarditis: "Shaggy" Vegetations seen on tricuspid valve in echocardiogram. TEE ordered.   Substance use disorder: Reports ongoing IV heroin use, last use 3 days prior to this admission.  He is at risk to go into opioid withdrawal. The patient is receiving suboxone 8-2 mg twice daily with as needed Suboxone per opioid withdrawal protocol. He is also receiving supportive care with Tylenol, antiemetics, Imodium, Atarax as needed. TOC consult ordered for substance abuse rehab referrals. Check HIV and hepatitis C antibodies.  Minimally decreased sodium at 134: Likely chronic. Of no particular clinical significance at this point. Monitor.   Thrombocytopenia:Likely due to sepsis.  No obvious bleeding. Avoid blood thinners and continue to monitor.   Hypokalemia: Supplement and monitor.  Magnesium is 2.0.   Bipolar/depression/anxiety: Not currently on medical therapy.  I have seen and examined this patient myself. I have spent 32 minutes in his evaluation and care.   DVT prophylaxis: SCDs Code Status: Full code Family Communication: Discussed with patient, he has discussed with family Disposition Plan: From home, discharge pending further work-up and management of bacteremia  Admission status:  Status is: Inpatient   Remains inpatient appropriate because:IV treatments appropriate due to intensity of illness or inability to take PO and Inpatient level of care appropriate due to severity of illness     Dispo: The patient is from: Home              Anticipated d/c is to: TBD pending clinical course, may need to go to SNF for prolonged IV antibiotics              Anticipated d/c date is: > 3 days              Patient currently is not medically stable to d/c.   Robert Severa, DO [facility] Direct contact: see [email or web address]  7PM-7AM contact night coverage as above 03/13/2020, 4:05 PM  LOS: 1 day

## 2020-03-14 ENCOUNTER — Inpatient Hospital Stay (HOSPITAL_COMMUNITY): Payer: Self-pay

## 2020-03-14 DIAGNOSIS — M00012 Staphylococcal arthritis, left shoulder: Secondary | ICD-10-CM

## 2020-03-14 LAB — CBC WITH DIFFERENTIAL/PLATELET
Abs Immature Granulocytes: 0.77 10*3/uL — ABNORMAL HIGH (ref 0.00–0.07)
Basophils Absolute: 0 10*3/uL (ref 0.0–0.1)
Basophils Relative: 0 %
Eosinophils Absolute: 0 10*3/uL (ref 0.0–0.5)
Eosinophils Relative: 0 %
HCT: 24.5 % — ABNORMAL LOW (ref 39.0–52.0)
Hemoglobin: 8.1 g/dL — ABNORMAL LOW (ref 13.0–17.0)
Immature Granulocytes: 5 %
Lymphocytes Relative: 11 %
Lymphs Abs: 1.8 10*3/uL (ref 0.7–4.0)
MCH: 28.3 pg (ref 26.0–34.0)
MCHC: 33.1 g/dL (ref 30.0–36.0)
MCV: 85.7 fL (ref 80.0–100.0)
Monocytes Absolute: 0.9 10*3/uL (ref 0.1–1.0)
Monocytes Relative: 6 %
Neutro Abs: 12.2 10*3/uL — ABNORMAL HIGH (ref 1.7–7.7)
Neutrophils Relative %: 78 %
Platelets: 47 10*3/uL — ABNORMAL LOW (ref 150–400)
RBC: 2.86 MIL/uL — ABNORMAL LOW (ref 4.22–5.81)
RDW: 14.2 % (ref 11.5–15.5)
WBC: 15.6 10*3/uL — ABNORMAL HIGH (ref 4.0–10.5)
nRBC: 0 % (ref 0.0–0.2)

## 2020-03-14 LAB — BASIC METABOLIC PANEL
Anion gap: 4 — ABNORMAL LOW (ref 5–15)
BUN: 19 mg/dL (ref 6–20)
CO2: 24 mmol/L (ref 22–32)
Calcium: 7.5 mg/dL — ABNORMAL LOW (ref 8.9–10.3)
Chloride: 99 mmol/L (ref 98–111)
Creatinine, Ser: 0.72 mg/dL (ref 0.61–1.24)
GFR calc Af Amer: >60 mL/min (ref >60)
GFR calc non Af Amer: >60 mL/min (ref >60)
Glucose, Bld: 104 mg/dL — ABNORMAL HIGH (ref 70–99)
Potassium: 4.1 mmol/L (ref 3.5–5.1)
Sodium: 127 mmol/L — ABNORMAL LOW (ref 135–145)

## 2020-03-14 LAB — CULTURE, BLOOD (ROUTINE X 2)

## 2020-03-14 LAB — HEPATITIS B SURFACE ANTIBODY, QUANTITATIVE: Hep B S AB Quant (Post): 46.2 m[IU]/mL (ref >9.9)

## 2020-03-14 MED ORDER — GADOBUTROL 1 MMOL/ML IV SOLN
7.0000 mL | Freq: Once | INTRAVENOUS | Status: AC | PRN
  Administered 2020-03-14: 7 mL via INTRAVENOUS

## 2020-03-14 NOTE — Consult Note (Addendum)
ORTHOPAEDIC CONSULTATION  REQUESTING PHYSICIAN: Swayze, Ava, DO  Chief Complaint: Left shoulder pain  HPI: Robert Kramer is a 44 y.o. male with history of polysubstance abuse, including IV drug use, and bipolar disorder. He is currently being treated for sepsis due to MRSA and Serratia bacteremia with pulmonary septic emboli, pneumonia, and tricuspid endocarditis. He is now reporting severe left shoulder pain and orthopedics was consulted. States left shoulder pain started near time of admission, today it is 10/10 pain, diffusely over left shoulder, and "has a fever in it". Pain is worse with movement and better with rest. Also reports right shoulder pain though this is moderate compared to left shoulder.  Endorses shortness of breath, cough, nausea and mild abdominal pain. Denies vomiting. Denies swelling or pain in any other joints, denies paraesthesias.   MRI of left shoulder was performed showing a glenohumeral joint effusion with synovitis suspicious for septic arthritis.   Past Medical History:  Diagnosis Date   Anxiety    Bipolar 1 disorder (HCC)    Bipolar 1 disorder (HCC)    DDD (degenerative disc disease), lumbar    Depression    Drug-seeking behavior    Hallux valgus with bunions    Mood swings    Polysubstance abuse (HCC)    Past Surgical History:  Procedure Laterality Date   fatty tumor removed from left foot     Social History   Socioeconomic History   Marital status: Legally Separated    Spouse name: Not on file   Number of children: Not on file   Years of education: Not on file   Highest education level: Not on file  Occupational History   Not on file  Tobacco Use   Smoking status: Current Every Day Smoker    Packs/day: 0.50    Years: 5.00    Pack years: 2.50    Types: Cigarettes   Smokeless tobacco: Never Used  Substance and Sexual Activity   Alcohol use: Yes    Alcohol/week: 1.0 standard drink    Types: 1 Cans of beer per week    Comment: occ    Drug use: No    Types: Cocaine, Marijuana    Comment: opiates - quit 2017   Sexual activity: Not on file  Other Topics Concern   Not on file  Social History Narrative   Not on file   Social Determinants of Health   Financial Resource Strain:    Difficulty of Paying Living Expenses: Not on file  Food Insecurity:    Worried About Running Out of Food in the Last Year: Not on file   Ran Out of Food in the Last Year: Not on file  Transportation Needs:    Lack of Transportation (Medical): Not on file   Lack of Transportation (Non-Medical): Not on file  Physical Activity:    Days of Exercise per Week: Not on file   Minutes of Exercise per Session: Not on file  Stress:    Feeling of Stress : Not on file  Social Connections:    Frequency of Communication with Friends and Family: Not on file   Frequency of Social Gatherings with Friends and Family: Not on file   Attends Religious Services: Not on file   Active Member of Clubs or Organizations: Not on file   Attends Banker Meetings: Not on file   Marital Status: Not on file   Family History  Problem Relation Age of Onset   Cancer Father  Allergies  Allergen Reactions   Tramadol Other (See Comments)    Upset stomach   Positive ROS: All other systems have been reviewed and were otherwise negative with the exception of those mentioned in the HPI and as above.  Physical Exam: General: Alert, sitting up in bed, no acute distress Cardiovascular: No pedal edema Respiratory: No cyanosis, no use of accessory musculature GI: No organomegaly, abdomen is soft to palpation. Skin: No rash or lesions noted at either shoulder. Neurologic: Sensation intact distally Psychiatric: Patient is competent for consent with normal mood and affect Lymphatic: No axillary or cervical lymphadenopathy  MUSCULOSKELETAL: LUE - 0-30 degrees AROM, 0-50 PROM due to pain. Distal sensation intact. Full ROM at left elbow with pain, no edema at  olecranon bursa. Full ROM at left wrist, able to flex, extend, and abduct all fingers of left hand. Grip strength 1/5. Distal sensation intact 2+ Radial pulse. RUE - 0-170 degrees of AROM with pain. Distal sensation intact. Full ROM at right elbow without pain, no edema at olecranon bursa. Full ROM at wrist, able to flex, extend, and abduct all fingers of hand. Distal sensation intact 2+ Radial pulse. BLE - No edema noted at either knee or ankle. Full dorsiflexion and plantarflexion intact bilaterally. Distal sensation intact bilaterally.   Assessment/Plan: Bilateral shoulder pain - left worse than right - MRI of left shoulder was performed showing a glenohumeral joint effusion with synovitis suspicious for septic arthritis.  - MRI of right shoulder has been ordered as well, would ideally like to get these results back to determine if we need to washout both shoulders given his bilateral pain - would like to perform arthroscopic irrigation and debridement of left shoulder (possibly both given MRI results) tomorrow, however, will have to coordinate with TEE schedule at [facility], [facility]    03/14/2020 5:09 PM  Reviewed, discussed, agree with above. High likelihood of septic shoulder, will plan for surgical intervention as quickly as can be organized given his multisystem involvement, and complex medical comorbidities.  Teryl Lucy, MD

## 2020-03-14 NOTE — Progress Notes (Signed)
PROGRESS NOTE  Robert Kramer ION:[id number] DOB: 07-31-1975 DOA: 03/11/2020 PCP: Patient, No Pcp Per  Brief History   Robert Kramer is a 44 y.o. male with medical history significant for polysubstance use disorder including injection drug use and bipolar disorder who presents to the ED for evaluation of abnormal blood cultures.   Patient was seen in the [place] long ED 03/06/2020 for evaluation and management of heroin withdrawal.  He was treated supportively.  Blood cultures were obtained and have now grown out MRSA and Serratia marcescens.  He was called today to come to the ED for further evaluation.   Patient states he is an IV drug user injecting heroin at any site in his arms or hands that he can gain access.  He reports last use 3 days ago.  He has been having ongoing fevers, chills, diaphoresis, shortness of breath, nonproductive cough, nausea, abdominal pain, and generally feeling unwell.  He feels as if he is about to go into opioid withdrawal.  He says he has used Suboxone in the past with benefit.  He denies any other illicit drug use or alcohol use.   ED Course:  Initial vitals showed BP 121/73, pulse 125, RR 24, temp 98.1 Fahrenheit, SPO2 98% on room air.   Labs show WBC 13.7, hemoglobin 10.7, platelets 32,000 (51,000 on 03/05/2020), sodium 134, potassium 3.1, bicarb 24, BUN 21, creatinine 0.81, serum glucose 141, AST 67, ALT 32, alk phos 76, total bilirubin 1.5, lactic acid 2.0 >> 2.5.   Blood and urine cultures were obtained and pending.  SARS-CoV-2 PCR is negative.   Portable chest x-ray shows multiple areas of hazy airspace opacities with cavitation in the inferior right upper lobe.   CTA chest PE study shows multiple cavitary and noncavitary nodules consistent with septic emboli, bilateral lower lobe pneumonia, mild bilateral hilar adenopathy felt to be reactive.  No plan for pulmonary emboli reported.   Patient was given 2.25 L LR, oral K 40 mEq, IV Ativan 1 mg once, and started  on IV vancomycin and ceftriaxone.  The hospitalist service was consulted to admit for further evaluation and management.  TTE was performed on 03/12/2020. It has demonstratred "shaggy" vegetations on the tricuspid valve. TEE was recommended to further clarify the problem.  MRI of left shoulder demonstrated septic arthropathy, myositis, and tenosynovitis. I discussed the patient with Robert Kramer. He recommended having the joint aspirated by IR and call him with results. I have discussed the patient with Robert Kramer. He will try to work the patient in. The patient is to go for TEE tomorrow.  Consultants  . Infectious Disease . Orthopedic surgery . Interventional Radiology . Cardiology  Procedures  . None  Antibiotics   Anti-infectives (From admission, onward)   Start     Dose/Rate Route Frequency Ordered Stop   03/12/20 2000  cefTRIAXone (ROCEPHIN) 2 g in sodium chloride 0.9 % 100 mL IVPB  Status:  Discontinued        2 g 200 mL/hr over 30 Minutes Intravenous Every 24 hours 03/11/20 2058 03/12/20 0937   03/12/20 1400  ceFEPIme (MAXIPIME) 2 g in sodium chloride 0.9 % 100 mL IVPB        2 g 200 mL/hr over 30 Minutes Intravenous Every 8 hours 03/12/20 0937     03/12/20 0000  vancomycin (VANCOCIN) IVPB 1000 mg/200 mL premix        1,000 mg 200 mL/hr over 60 Minutes Intravenous Every 8 hours 03/11/20 1816  03/11/20 1830  cefTRIAXone (ROCEPHIN) 2 g in sodium chloride 0.9 % 100 mL IVPB        2 g 200 mL/hr over 30 Minutes Intravenous  Once 03/11/20 1822 03/11/20 2030   03/11/20 1545  vancomycin (VANCOREADY) IVPB 1500 mg/300 mL        1,500 mg 150 mL/hr over 120 Minutes Intravenous  Once 03/11/20 1532 03/11/20 1819     Subjective  The patient is lying in bed. No new complaints.  Objective   Vitals:  Vitals:   03/14/20 0611 03/14/20 1206  BP: 101/65 105/64  Pulse: (!) 118 (!) 118  Resp: 20 18  Temp: 98.7 F (37.1 C) 99.1 F (37.3 C)  SpO2: 93% 91%   Exam:  Constitutional:    The patient is awake, alert, and oriented x 3. No acute distress. Respiratory:  . No increased work of breathing. . No wheezes, rales, or rhonchi . No tactile fremitus Cardiovascular:  . Regular rate and rhythm . No murmurs, ectopy, or gallups. . No lateral PMI. No thrills. Abdomen:  . Abdomen is soft, non-tender, non-distended . No hernias, masses, or organomegaly . Normoactive bowel sounds.  Musculoskeletal:  . No cyanosis, clubbing, or edema Skin:  . No rashes, lesions, ulcers . palpation of skin: no induration or nodules Neurologic:  . CN 2-12 intact . Sensation all 4 extremities intact Psychiatric:  . Mental status o Mood, affect appropriate o Orientation to person, place, time  . judgment and insight appear intact  I have personally reviewed the following:   Today's Data  . Vitals, BMP, CBC  Micro Data  . Blood Cultures x 2: Serratia Marsescens, MRSA   Cardiology Data  . TTE: Tricuspid vegetations . TEE: Pending. MRI Left Shoulder: Septic arthropathy, myositis, tenosynovitis  Scheduled Meds: . buprenorphine-naloxone  1 tablet Sublingual BID  . feeding supplement (ENSURE ENLIVE)  237 mL Oral BID BM  . multivitamin with minerals  1 tablet Oral Daily  . sodium chloride flush  3 mL Intravenous Q12H   Continuous Infusions: . ceFEPime (MAXIPIME) IV 2 g (03/14/20 1359)  . vancomycin 1,000 mg (03/14/20 0748)    Principal Problem:   Severe sepsis due to methicillin resistant Staphylococcus aureus (MRSA) with acute organ dysfunction (HCC) Active Problems:   Polysubstance dependence (HCC)   Hypokalemia   Thrombocytopenia (HCC)   Serratia septicemia (HCC)   Bipolar 1 disorder (HCC)   Heroin abuse ([place])   MRSA bacteremia   LOS: 3 days   A & P  Severe sepsis due to MRSA and Serratia bacteremia with pulmonary septic emboli and pneumonia, bacteremia and tricuspid endocarditis: Presenting with tachypnea with RR up to 32, tachycardia up to 125, and evidence of  endorgan damage with platelet count of 32,000.  Blood cultures from 03/06/2020 grew out MRSA and Serratia.  CT chest shows multiple cavitary and noncavitary septic emboli and bilateral lower lobe pneumonia.  He has been started on appropriate antibiotics. The patient has been continued on IV vancomycin to treat MRSA and IV ceftriaxone to treat Serratia. Echocardiogram demonstrated tricuspid vegetations.  Repeat blood blood culture was collected on 9/6. It has grown out MRSA. Plan is for TEE at [facility] tomorrow.  Endocarditis: "Shaggy" Vegetations seen on tricuspid valve in echocardiogram. Plan is for TEE at [facility] tomorrow.  Septic Left Shoulder: MRI of left shoulder demonstrated septic arthropathy, myositis, and tenosynovitis. I discussed the patient with Robert Kramer. He recommended having the joint aspirated by IR and call him with results. I have  discussed the patient with Robert Kramer. He will try to work the patient in. The patient is to go for TEE tomorrow.   Substance use disorder: Reports ongoing IV heroin use, last use 3 days prior to this admission.  He is at risk to go into opioid withdrawal. The patient is receiving suboxone 8-2 mg twice daily with as needed Suboxone per opioid withdrawal protocol. He is also receiving supportive care with Tylenol, antiemetics, Imodium, Atarax as needed. TOC consult ordered for substance abuse rehab referrals. Check HIV and hepatitis C antibodies.  Minimally decreased sodium at 134: Likely chronic. Of no particular clinical significance at this point. Monitor.   Thrombocytopenia:Likely due to sepsis.  No obvious bleeding. Avoid blood thinners and continue to monitor.   Hypokalemia: Supplement and monitor.  Magnesium is 2.0.   Bipolar/depression/anxiety: Not currently on medical therapy.  I have seen and examined this patient myself. I have spent 45 minutes in his evaluation and care.   DVT prophylaxis: SCDs Code Status: Full code Family Communication: Discussed  with patient, he has discussed with family Disposition Plan: From home, discharge pending further work-up and management of bacteremia  Admission status:  Status is: Inpatient   Remains inpatient appropriate because:IV treatments appropriate due to intensity of illness or inability to take PO and Inpatient level of care appropriate due to severity of illness     Dispo: The patient is from: Home              Anticipated d/c is to: TBD pending clinical course, may need to go to SNF for prolonged IV antibiotics              Anticipated d/c date is: > 3 days              Patient currently is not medically stable to d/c.   Kieron Kantner, DO [facility] Direct contact: see [email or web address]  7PM-7AM contact night coverage as above 03/13/2020, 4:05 PM  LOS: 1 day

## 2020-03-14 NOTE — Progress Notes (Signed)
Patient was sleeping.   03/14/20 1000  Clinical Encounter Type  Visited With Patient not available;Other (Comment) (Patient was sleeping)  Referral From Physician  Consult/Referral To Chaplain

## 2020-03-14 NOTE — Progress Notes (Signed)
Subjective: Diffuse pain though worse in left shoulder    Antibiotics:  Anti-infectives (From admission, onward)   Start     Dose/Rate Route Frequency Ordered Stop   03/12/20 2000  cefTRIAXone (ROCEPHIN) 2 g in sodium chloride 0.9 % 100 mL IVPB  Status:  Discontinued        2 g 200 mL/hr over 30 Minutes Intravenous Every 24 hours 03/11/20 2058 03/12/20 0937   03/12/20 1400  ceFEPIme (MAXIPIME) 2 g in sodium chloride 0.9 % 100 mL IVPB        2 g 200 mL/hr over 30 Minutes Intravenous Every 8 hours 03/12/20 0937     03/12/20 0000  vancomycin (VANCOCIN) IVPB 1000 mg/200 mL premix        1,000 mg 200 mL/hr over 60 Minutes Intravenous Every 8 hours 03/11/20 1816     03/11/20 1830  cefTRIAXone (ROCEPHIN) 2 g in sodium chloride 0.9 % 100 mL IVPB        2 g 200 mL/hr over 30 Minutes Intravenous  Once 03/11/20 1822 03/11/20 2030   03/11/20 1545  vancomycin (VANCOREADY) IVPB 1500 mg/300 mL        1,500 mg 150 mL/hr over 120 Minutes Intravenous  Once 03/11/20 1532 03/11/20 1819      Medications: Scheduled Meds: . buprenorphine-naloxone  1 tablet Sublingual BID  . feeding supplement (ENSURE ENLIVE)  237 mL Oral BID BM  . multivitamin with minerals  1 tablet Oral Daily  . sodium chloride flush  3 mL Intravenous Q12H   Continuous Infusions: . ceFEPime (MAXIPIME) IV 2 g (03/14/20 1359)  . vancomycin 1,000 mg (03/14/20 0748)   PRN Meds:.acetaminophen **OR** acetaminophen, hydrOXYzine, loperamide, ondansetron **OR** ondansetron (ZOFRAN) IV    Objective: Weight change:   Intake/Output Summary (Last 24 hours) at 03/14/2020 1410 Last data filed at 03/14/2020 1052 Gross per 24 hour  Intake 1120 ml  Output 900 ml  Net 220 ml   Blood pressure 105/64, pulse (!) 118, temperature 99.1 F (37.3 C), temperature source Oral, resp. rate 18, height 5\' 11"  (1.803 m), weight 70.8 kg, SpO2 91 %. Temp:  [98.5 F (36.9 C)-100.1 F (37.8 C)] 99.1 F (37.3 C) (09/09 1206) Pulse Rate:   [118-123] 118 (09/09 1206) Resp:  [18-20] 18 (09/09 1206) BP: (101-115)/(64-71) 105/64 (09/09 1206) SpO2:  [91 %-93 %] 91 % (09/09 1206)  Physical Exam: General: Alert and awake, oriented x3 HEENT: anicteric sclera, EOMI CVS tahcycardic Chest: , no wheezing, no respiratory distress Abdomen: soft non-distended,  Extremities:tendenress on left shoulder and sig limied rom Skin: no rashes Neuro: nonfocal  CBC:    BMET Recent Labs    03/12/20 0400 03/14/20 0503  NA 134* 127*  K 4.4 4.1  CL 102 99  CO2 26 24  GLUCOSE 114* 104*  BUN 20 19  CREATININE 0.52* 0.72  CALCIUM 7.8* 7.5*     Liver Panel  Recent Labs    03/11/20 1536 03/12/20 0400  PROT 6.5 5.5*  ALBUMIN 1.7* 1.3*  AST 67* 56*  ALT 32 28  ALKPHOS 76 63  BILITOT 1.5* 1.0       Sedimentation Rate No results for input(s): ESRSEDRATE in the last 72 hours. C-Reactive Protein No results for input(s): CRP in the last 72 hours.  Micro Results: Recent Results (from the past 720 hour(s))  Blood culture (routine x 2)     Status: Abnormal   Collection Time: 03/06/20  6:34 AM   Specimen:  BLOOD RIGHT FOREARM  Result Value Ref Range Status   Specimen Description BLOOD RIGHT FOREARM  Final   Special Requests   Final    BOTTLES DRAWN AEROBIC AND ANAEROBIC Blood Culture adequate volume   Culture  Setup Time   Final    GRAM NEGATIVE RODS AEROBIC BOTTLE ONLY CRITICAL RESULT CALLED TO, READ BACK BY AND VERIFIED WITH: S COBLE RN 1831 03/08/20 A BROWNING GRAM POSITIVE COCCI IN CLUSTERS ANAEROBIC BOTTLE ONLY CRITICAL VALUE NOTED.  VALUE IS CONSISTENT WITH PREVIOUSLY REPORTED AND CALLED VALUE. Performed at [facility], [address] [place], [place] [address]    Culture SERRATIA MARCESCENS (A)  Final   Report Status 03/10/2020 FINAL  Final   Organism ID, Bacteria SERRATIA MARCESCENS  Final      Susceptibility   Serratia marcescens - MIC*    CEFAZOLIN >=64 RESISTANT Resistant     CEFEPIME <=0.12  SENSITIVE Sensitive     CEFTAZIDIME <=1 SENSITIVE Sensitive     CEFTRIAXONE <=0.25 SENSITIVE Sensitive     CIPROFLOXACIN <=0.25 SENSITIVE Sensitive     GENTAMICIN <=1 SENSITIVE Sensitive     TRIMETH/SULFA <=20 SENSITIVE Sensitive     * SERRATIA MARCESCENS  Blood Culture ID Panel (Reflexed)     Status: Abnormal   Collection Time: 03/06/20  6:34 AM  Result Value Ref Range Status   Enterococcus faecalis NOT DETECTED NOT DETECTED Final   Enterococcus Faecium NOT DETECTED NOT DETECTED Final   Listeria monocytogenes NOT DETECTED NOT DETECTED Final   Staphylococcus species NOT DETECTED NOT DETECTED Final   Staphylococcus aureus (BCID) NOT DETECTED NOT DETECTED Final   Staphylococcus epidermidis NOT DETECTED NOT DETECTED Final   Staphylococcus lugdunensis NOT DETECTED NOT DETECTED Final   Streptococcus species NOT DETECTED NOT DETECTED Final   Streptococcus agalactiae NOT DETECTED NOT DETECTED Final   Streptococcus pneumoniae NOT DETECTED NOT DETECTED Final   Streptococcus pyogenes NOT DETECTED NOT DETECTED Final   A.calcoaceticus-baumannii NOT DETECTED NOT DETECTED Final   Bacteroides fragilis NOT DETECTED NOT DETECTED Final   Enterobacterales DETECTED (A) NOT DETECTED Final    Comment: Enterobacterales represent a large order of gram negative bacteria, not a single organism. CRITICAL RESULT CALLED TO, READ BACK BY AND VERIFIED WITH: S COBLE RN [phone] 03/08/20 A BROWNING    Enterobacter cloacae complex NOT DETECTED NOT DETECTED Final   Escherichia coli NOT DETECTED NOT DETECTED Final   Klebsiella aerogenes NOT DETECTED NOT DETECTED Final   Klebsiella oxytoca NOT DETECTED NOT DETECTED Final   Klebsiella pneumoniae NOT DETECTED NOT DETECTED Final   Proteus species NOT DETECTED NOT DETECTED Final   Salmonella species NOT DETECTED NOT DETECTED Final   Serratia marcescens DETECTED (A) NOT DETECTED Final    Comment: CRITICAL RESULT CALLED TO, READ BACK BY AND VERIFIED WITH: S COBLE RN 1831 03/08/20  A BROWNING    Haemophilus influenzae NOT DETECTED NOT DETECTED Final   Neisseria meningitidis NOT DETECTED NOT DETECTED Final   Pseudomonas aeruginosa NOT DETECTED NOT DETECTED Final   Stenotrophomonas maltophilia NOT DETECTED NOT DETECTED Final   Candida albicans NOT DETECTED NOT DETECTED Final   Candida auris NOT DETECTED NOT DETECTED Final   Candida glabrata NOT DETECTED NOT DETECTED Final   Candida krusei NOT DETECTED NOT DETECTED Final   Candida parapsilosis NOT DETECTED NOT DETECTED Final   Candida tropicalis NOT DETECTED NOT DETECTED Final   Cryptococcus neoformans/gattii NOT DETECTED NOT DETECTED Final   CTX-M ESBL NOT DETECTED NOT DETECTED Final   Carbapenem  resistance IMP NOT DETECTED NOT DETECTED Final   Carbapenem resistance KPC NOT DETECTED NOT DETECTED Final   Carbapenem resistance NDM NOT DETECTED NOT DETECTED Final   Carbapenem resist OXA 48 LIKE NOT DETECTED NOT DETECTED Final   Carbapenem resistance VIM NOT DETECTED NOT DETECTED Final    Comment: Performed at [facility], [address] [place], [place] [address]  Blood culture (routine x 2)     Status: Abnormal   Collection Time: 03/06/20  6:44 AM   Specimen: BLOOD  Result Value Ref Range Status   Specimen Description BLOOD RIGHT ANTECUBITAL  Final   Special Requests   Final    BOTTLES DRAWN AEROBIC AND ANAEROBIC Blood Culture adequate volume   Culture  Setup Time   Final    IN BOTH AEROBIC AND ANAEROBIC BOTTLES GRAM POSITIVE COCCI IN CLUSTERS CRITICAL RESULT CALLED TO, READ BACK BY AND VERIFIED WITH: L ADKINS RN 03/09/20 0219 JDW Performed at [facility], [address] [place], [place] [address]    Culture METHICILLIN RESISTANT STAPHYLOCOCCUS AUREUS (A)  Final   Report Status 03/10/2020 FINAL  Final   Organism ID, Bacteria METHICILLIN RESISTANT STAPHYLOCOCCUS AUREUS  Final      Susceptibility   Methicillin resistant staphylococcus aureus - MIC*    CIPROFLOXACIN <=0.5 SENSITIVE Sensitive      ERYTHROMYCIN >=8 RESISTANT Resistant     GENTAMICIN <=0.5 SENSITIVE Sensitive     OXACILLIN >=4 RESISTANT Resistant     TETRACYCLINE <=1 SENSITIVE Sensitive     VANCOMYCIN 1 SENSITIVE Sensitive     TRIMETH/SULFA <=10 SENSITIVE Sensitive     CLINDAMYCIN <=0.25 SENSITIVE Sensitive     RIFAMPIN <=0.5 SENSITIVE Sensitive     Inducible Clindamycin NEGATIVE Sensitive     * METHICILLIN RESISTANT STAPHYLOCOCCUS AUREUS  Blood Culture ID Panel (Reflexed)     Status: Abnormal   Collection Time: 03/06/20  6:44 AM  Result Value Ref Range Status   Enterococcus faecalis NOT DETECTED NOT DETECTED Final   Enterococcus Faecium NOT DETECTED NOT DETECTED Final   Listeria monocytogenes NOT DETECTED NOT DETECTED Final   Staphylococcus species DETECTED (A) NOT DETECTED Final    Comment: CRITICAL RESULT CALLED TO, READ BACK BY AND VERIFIED WITH: L ADKINS RN 03/09/20 0208 JDW    Staphylococcus aureus (BCID) DETECTED (A) NOT DETECTED Final    Comment: Methicillin (oxacillin)-resistant Staphylococcus aureus (MRSA). MRSA is predictably resistant to beta-lactam antibiotics (except ceftaroline). Preferred therapy is vancomycin unless clinically contraindicated. Patient requires contact precautions if  hospitalized. CRITICAL RESULT CALLED TO, READ BACK BY AND VERIFIED WITH: L ADKINS RN 03/09/20 0208 JDW    Staphylococcus epidermidis NOT DETECTED NOT DETECTED Final   Staphylococcus lugdunensis NOT DETECTED NOT DETECTED Final   Streptococcus species NOT DETECTED NOT DETECTED Final   Streptococcus agalactiae NOT DETECTED NOT DETECTED Final   Streptococcus pneumoniae NOT DETECTED NOT DETECTED Final   Streptococcus pyogenes NOT DETECTED NOT DETECTED Final   A.calcoaceticus-baumannii NOT DETECTED NOT DETECTED Final   Bacteroides fragilis NOT DETECTED NOT DETECTED Final   Enterobacterales NOT DETECTED NOT DETECTED Final   Enterobacter cloacae complex NOT DETECTED NOT DETECTED Final   Escherichia coli NOT DETECTED NOT  DETECTED Final   Klebsiella aerogenes NOT DETECTED NOT DETECTED Final   Klebsiella oxytoca NOT DETECTED NOT DETECTED Final   Klebsiella pneumoniae NOT DETECTED NOT DETECTED Final   Proteus species NOT DETECTED NOT DETECTED Final   Salmonella species NOT DETECTED NOT DETECTED Final   Serratia marcescens NOT  DETECTED NOT DETECTED Final   Haemophilus influenzae NOT DETECTED NOT DETECTED Final   Neisseria meningitidis NOT DETECTED NOT DETECTED Final   Pseudomonas aeruginosa NOT DETECTED NOT DETECTED Final   Stenotrophomonas maltophilia NOT DETECTED NOT DETECTED Final   Candida albicans NOT DETECTED NOT DETECTED Final   Candida auris NOT DETECTED NOT DETECTED Final   Candida glabrata NOT DETECTED NOT DETECTED Final   Candida krusei NOT DETECTED NOT DETECTED Final   Candida parapsilosis NOT DETECTED NOT DETECTED Final   Candida tropicalis NOT DETECTED NOT DETECTED Final   Cryptococcus neoformans/gattii NOT DETECTED NOT DETECTED Final   Meth resistant mecA/C and MREJ DETECTED (A) NOT DETECTED Final    Comment: CRITICAL RESULT CALLED TO, READ BACK BY AND VERIFIED WITH: L ADKINS RN 03/09/20 0208 JDW Performed at [facility], [address], [place], [place] [address]   Urine culture     Status: Abnormal   Collection Time: 03/11/20  3:28 PM   Specimen: In/Out Cath Urine  Result Value Ref Range Status   Specimen Description   Final    IN/OUT CATH URINE Performed at [facility], [address] [place], [place] [address]    Special Requests   Final    NONE Performed at [facility], [address], [place], [place] [address]    Culture MULTIPLE SPECIES PRESENT, SUGGEST RECOLLECTION (A)  Final   Report Status 03/13/2020 FINAL  Final  Blood culture (routine x 2)     Status: Abnormal   Collection Time: 03/11/20  3:31 PM   Specimen: BLOOD  Result Value Ref Range Status   Specimen Description   Final    BLOOD LEFT ARM Performed at [facility], [address], [place], [place] [address]    Special Requests   Final    BOTTLES DRAWN AEROBIC AND ANAEROBIC Blood Culture results may not be optimal due to an excessive volume of blood received in culture bottles Performed at [facility], [address] [place], [place] [address]    Culture  Setup Time   Final    GRAM POSITIVE COCCI IN CLUSTERS IN BOTH AEROBIC AND ANAEROBIC BOTTLES CRITICAL VALUE NOTED.  VALUE IS CONSISTENT WITH PREVIOUSLY REPORTED AND CALLED VALUE.    Culture (A)  Final    STAPHYLOCOCCUS AUREUS SUSCEPTIBILITIES PERFORMED ON PREVIOUS CULTURE WITHIN THE LAST 5 DAYS. Performed at [facility], [address], [place], [place] [address]    Report Status 03/14/2020 FINAL  Final  SARS Coronavirus 2 by RT PCR (hospital order, performed in [facility] hospital lab) Nasopharyngeal Nasopharyngeal Swab     Status: None   Collection Time: 03/11/20  5:05 PM   Specimen: Nasopharyngeal Swab  Result Value Ref Range Status   SARS Coronavirus 2 NEGATIVE NEGATIVE Final    Comment: (NOTE) SARS-CoV-2 target nucleic acids are NOT DETECTED.  The SARS-CoV-2 RNA is generally detectable in upper and lower respiratory specimens during the acute phase of infection. The lowest concentration of SARS-CoV-2 viral copies this assay can detect is 250 copies / mL. A negative result does not preclude SARS-CoV-2 infection and should not be used as the sole basis for treatment or other patient management decisions.  A negative result may occur with improper specimen collection / handling, submission of specimen other than nasopharyngeal swab, presence of viral mutation(s) within the areas targeted by this assay, and inadequate number of viral copies (<250 copies / mL). A negative result must  be combined with clinical observations, patient history, and epidemiological information.  Fact Sheet for Patients:    BoilerBrush.com.cy  Fact Sheet for Healthcare Providers: https://pope.com/  This test is not yet approved or  cleared by the [place] FDA and has been authorized for detection and/or diagnosis of SARS-CoV-2 by FDA under an Emergency Use Authorization (EUA).  This EUA will remain in effect (meaning this test can be used) for the duration of the COVID-19 declaration under Section 564(b)(1) of the Act, 21 U.S.C. section 360bbb-3(b)(1), unless the authorization is terminated or revoked sooner.  Performed at [facility], [address], [place], [place] [address]   Culture, blood (Routine X 2) w Reflex to ID Panel     Status: None (Preliminary result)   Collection Time: 03/13/20 11:09 AM   Specimen: BLOOD  Result Value Ref Range Status   Specimen Description   Final    BLOOD RIGHT ANTECUBITAL Performed at [facility], [address] [place], [place] [address]    Special Requests   Final    BOTTLES DRAWN AEROBIC AND ANAEROBIC Blood Culture adequate volume Performed at [facility], [address], [place], [place] [address]    Culture   Final    NO GROWTH < 24 HOURS Performed at [facility], [address] [place], [place] [address]    Report Status PENDING  Incomplete  Culture, blood (Routine X 2) w Reflex to ID Panel     Status: None (Preliminary result)   Collection Time: 03/13/20 11:10 AM   Specimen: BLOOD RIGHT HAND  Result Value Ref Range Status   Specimen Description   Final    BLOOD RIGHT HAND Performed at [facility], [address] [place], [place] [address]    Special Requests   Final    BOTTLES DRAWN AEROBIC ONLY Blood Culture results may not be optimal due to an inadequate volume of blood received in culture bottles Performed at [facility], [address], [place], [place] [address]    Culture   Final     NO GROWTH < 24 HOURS Performed at [facility], [address], [place], [place] [address]    Report Status PENDING  Incomplete    Studies/Results: MR Shoulder Left W Wo Contrast  Result Date: 03/14/2020 CLINICAL DATA:  Left shoulder pain. Clinical concern for septic arthritis EXAM: MRI OF THE LEFT SHOULDER WITHOUT AND WITH CONTRAST TECHNIQUE: Multiplanar, multisequence MR imaging of the left shoulder was performed before and after the administration of intravenous contrast. CONTRAST:  42mL GADAVIST GADOBUTROL 1 MMOL/ML IV SOLN COMPARISON:  X-ray 04/20/2014 FINDINGS: Rotator cuff: Tendinosis with irregular full-thickness non retracted tear of the anterior supraspinatus tendon (series 7, images 8-9; series 10, images 10-12). Infraspinatus, subscapularis, and teres minor tendons are intact. Muscles: There is intramuscular edema within the rotator cuff musculature, most pronounced within the supraspinatus and subscapularis muscles. There is also intramuscular edema within the anterior and lateral deltoid muscles. No muscle atrophy or fatty infiltration. Biceps long head:  Intact biceps tendon with tenosynovitis. Acromioclavicular Joint: Mild arthropathy of the AC joint. Small volume subacromial-subdeltoid bursal fluid. Glenohumeral Joint: Moderate-sized glenohumeral joint effusion with enhancing synovitis. There is fluid distension within the subscapularis joint recess. No cartilage defect. Labrum:  Intact. Bones: No bony erosion. No bone marrow edema. No acute fracture or dislocation. Other: No organized soft tissue fluid collection. Partially visualized opacities within the visualized lung field. IMPRESSION: 1.  Moderate-sized glenohumeral joint effusion with enhancing synovitis, suspicious for septic arthritis in the setting of known systemic infection. Arthrocentesis is recommended. 2. No evidence of osteomyelitis. 3. Intramuscular edema within the rotator cuff musculature, most pronounced within the  supraspinatus and subscapularis muscles. Findings are nonspecific and may reflect myositis. No intramuscular abscess. 4. Intact biceps tendon with tenosynovitis. 5. Supraspinatus tendinosis with focal full-thickness non-retracted tear anteriorly. 6. Mild AC joint arthropathy. These results will be called to the ordering clinician or representative by the Radiologist Assistant, and communication documented in the PACS or [employer] Dashboard. Electronically Signed   By: Duanne GuessNicholas  Plundo D.O.   On: 03/14/2020 10:25      Assessment/Plan:  INTERVAL HISTORY: MRI left shoulder shows evidence of septic joint   Principal Problem:   Severe sepsis due to methicillin resistant Staphylococcus aureus (MRSA) with acute organ dysfunction (HCC) Active Problems:   Polysubstance dependence (HCC)   Hypokalemia   Thrombocytopenia (HCC)   Serratia septicemia (HCC)   Bipolar 1 disorder (HCC)   Heroin abuse (HCC)   MRSA bacteremia    Robert Kramer is a 44 y.o. 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 His MRI left shoulder shows evident septic joint. His right shoulder is also with limited ROM and tenderness  #1 Polymicrobial bacteremia with endocarditis --continue current abx --repeat blood cultures --TEE and he will need CVTS consult --look for metastatic sources of infection and will now also get MRI right shoulder and MRI spine  #2 Septic shoulder: he will need surgery and would consult orthopedic surgery  #2 HCV: checking labs  #3 IVDU: will need plan for this.   LOS: 3 days   Acey LavCornelius Van Dam 03/14/2020, 2:10 PM

## 2020-03-15 ENCOUNTER — Inpatient Hospital Stay (HOSPITAL_COMMUNITY): Payer: Self-pay | Admitting: Certified Registered"

## 2020-03-15 ENCOUNTER — Encounter (HOSPITAL_COMMUNITY)
Admission: EM | Disposition: A | Discharge status: Home / Self Care | Payer: Self-pay | Source: Home / Self Care | Attending: Internal Medicine

## 2020-03-15 ENCOUNTER — Inpatient Hospital Stay (HOSPITAL_COMMUNITY): Payer: Self-pay

## 2020-03-15 ENCOUNTER — Encounter (HOSPITAL_COMMUNITY): Payer: Self-pay | Admitting: Internal Medicine

## 2020-03-15 DIAGNOSIS — I313 Pericardial effusion (noninflammatory): Secondary | ICD-10-CM

## 2020-03-15 DIAGNOSIS — I361 Nonrheumatic tricuspid (valve) insufficiency: Secondary | ICD-10-CM

## 2020-03-15 HISTORY — PX: SHOULDER ARTHROSCOPY: SHX128

## 2020-03-15 HISTORY — PX: TEE WITHOUT CARDIOVERSION: SHX5443

## 2020-03-15 LAB — HEPATITIS C GENOTYPE

## 2020-03-15 LAB — TYPE AND SCREEN
ABO/RH(D): O POS
Antibody Screen: NEGATIVE

## 2020-03-15 LAB — SURGICAL PCR SCREEN
MRSA, PCR: NEGATIVE
Staphylococcus aureus: NEGATIVE

## 2020-03-15 LAB — ABO/RH: ABO/RH(D): O POS

## 2020-03-15 SURGERY — ARTHROSCOPY, SHOULDER
Anesthesia: General | Site: Shoulder

## 2020-03-15 MED ORDER — SODIUM CHLORIDE 0.9 % IV SOLN
INTRAVENOUS | Status: DC | PRN
  Administered 2020-03-15: 80 ug/min via INTRAVENOUS

## 2020-03-15 MED ORDER — SUCCINYLCHOLINE CHLORIDE 200 MG/10ML IV SOSY
PREFILLED_SYRINGE | INTRAVENOUS | Status: DC | PRN
  Administered 2020-03-15: 80 mg via INTRAVENOUS

## 2020-03-15 MED ORDER — DEXMEDETOMIDINE HCL 200 MCG/2ML IV SOLN
INTRAVENOUS | Status: DC | PRN
  Administered 2020-03-15 (×2): 8 ug via INTRAVENOUS

## 2020-03-15 MED ORDER — FENTANYL CITRATE (PF) 250 MCG/5ML IJ SOLN
INTRAMUSCULAR | Status: DC | PRN
Start: 2020-03-15 — End: 2020-03-15
  Administered 2020-03-15: 100 ug via INTRAVENOUS

## 2020-03-15 MED ORDER — LACTATED RINGERS IV SOLN: INTRAVENOUS | Status: DC | PRN

## 2020-03-15 MED ORDER — ACETAMINOPHEN 500 MG PO TABS: 1000.0000 mg | ORAL_TABLET | Freq: Once | ORAL | Status: AC

## 2020-03-15 MED ORDER — EPHEDRINE SULFATE 50 MG/ML IJ SOLN
INTRAMUSCULAR | Status: DC | PRN
  Administered 2020-03-15: 20 mg via INTRAVENOUS

## 2020-03-15 MED ORDER — PROPOFOL 10 MG/ML IV BOLUS
INTRAVENOUS | Status: AC
  Filled 2020-03-15: qty 20

## 2020-03-15 MED ORDER — ROCURONIUM BROMIDE 10 MG/ML (PF) SYRINGE
PREFILLED_SYRINGE | INTRAVENOUS | Status: AC
  Filled 2020-03-15: qty 10

## 2020-03-15 MED ORDER — KETAMINE HCL 10 MG/ML IJ SOLN
INTRAMUSCULAR | Status: DC | PRN
  Administered 2020-03-15: 20 mg via INTRAVENOUS

## 2020-03-15 MED ORDER — MIDAZOLAM HCL 2 MG/2ML IJ SOLN
INTRAMUSCULAR | Status: AC
  Filled 2020-03-15: qty 2

## 2020-03-15 MED ORDER — CALCIUM CHLORIDE 10 % IV SOLN
INTRAVENOUS | Status: DC | PRN
  Administered 2020-03-15: 1 g via INTRAVENOUS

## 2020-03-15 MED ORDER — PHENYLEPHRINE HCL (PRESSORS) 10 MG/ML IV SOLN
INTRAVENOUS | Status: DC | PRN
  Administered 2020-03-15: 80 ug via INTRAVENOUS
  Administered 2020-03-15: 120 ug via INTRAVENOUS
  Administered 2020-03-15: 40 ug via INTRAVENOUS

## 2020-03-15 MED ORDER — KETAMINE HCL 50 MG/5ML IJ SOSY
PREFILLED_SYRINGE | INTRAMUSCULAR | Status: AC
  Filled 2020-03-15: qty 5

## 2020-03-15 MED ORDER — CHLORHEXIDINE GLUCONATE 0.12 % MT SOLN
OROMUCOSAL | Status: AC
  Filled 2020-03-15: qty 15

## 2020-03-15 MED ORDER — FENTANYL CITRATE (PF) 250 MCG/5ML IJ SOLN
INTRAMUSCULAR | Status: AC
  Filled 2020-03-15: qty 5

## 2020-03-15 MED ORDER — LIDOCAINE 2% (20 MG/ML) 5 ML SYRINGE
INTRAMUSCULAR | Status: DC | PRN
  Administered 2020-03-15: 60 mg via INTRAVENOUS

## 2020-03-15 MED ORDER — LIDOCAINE 2% (20 MG/ML) 5 ML SYRINGE
INTRAMUSCULAR | Status: AC
  Filled 2020-03-15: qty 5

## 2020-03-15 MED ORDER — SUGAMMADEX SODIUM 200 MG/2ML IV SOLN
INTRAVENOUS | Status: DC | PRN
  Administered 2020-03-15: 200 mg via INTRAVENOUS

## 2020-03-15 MED ORDER — MIDAZOLAM HCL 2 MG/2ML IJ SOLN
INTRAMUSCULAR | Status: DC | PRN
  Administered 2020-03-15: 2 mg via INTRAVENOUS

## 2020-03-15 MED ORDER — ROCURONIUM BROMIDE 10 MG/ML (PF) SYRINGE
PREFILLED_SYRINGE | INTRAVENOUS | Status: DC | PRN
  Administered 2020-03-15: 60 mg via INTRAVENOUS

## 2020-03-15 MED ORDER — CHLORHEXIDINE GLUCONATE 4 % EX LIQD: 60.0000 mL | Freq: Once | CUTANEOUS | Status: DC

## 2020-03-15 MED ORDER — ACETAMINOPHEN 500 MG PO TABS
ORAL_TABLET | ORAL | Status: AC
  Administered 2020-03-15: 1000 mg via ORAL
  Filled 2020-03-15: qty 2

## 2020-03-15 MED ORDER — PROPOFOL 10 MG/ML IV BOLUS
INTRAVENOUS | Status: DC | PRN
  Administered 2020-03-15: 150 mg via INTRAVENOUS

## 2020-03-15 MED ORDER — POVIDONE-IODINE 10 % EX SWAB
2.0000 "application " | Freq: Once | CUTANEOUS | Status: AC
  Administered 2020-03-15: 2 via TOPICAL

## 2020-03-15 MED ORDER — SODIUM CHLORIDE 0.9 % IV SOLN: INTRAVENOUS | Status: DC

## 2020-03-15 MED ORDER — SODIUM CHLORIDE 0.9 % IR SOLN
Status: DC | PRN
  Administered 2020-03-15 (×4): 3000 mL

## 2020-03-15 MED ORDER — ONDANSETRON HCL 4 MG/2ML IJ SOLN
INTRAMUSCULAR | Status: DC | PRN
  Administered 2020-03-15: 4 mg via INTRAVENOUS

## 2020-03-15 MED ORDER — ALBUMIN HUMAN 5 % IV SOLN: INTRAVENOUS | Status: DC | PRN

## 2020-03-15 SURGICAL SUPPLY — 47 items
BLADE CUTTER GATOR 3.5 (BLADE) ×2 IMPLANT
BLADE SURG 15 STRL LF DISP TIS (BLADE) IMPLANT
BLADE SURG 15 STRL SS (BLADE)
BOOTCOVER CLEANROOM LRG (PROTECTIVE WEAR) ×16 IMPLANT
CANISTER SUCT 3000ML PPV (MISCELLANEOUS) IMPLANT
CANNULA SHOULDER 7CM (CANNULA) ×4 IMPLANT
COVER WAND RF STERILE (DRAPES) ×4 IMPLANT
DISSECTOR  3.8MM X 13CM (MISCELLANEOUS) ×4
DISSECTOR 3.8MM X 13CM (MISCELLANEOUS) IMPLANT
DRAPE HALF SHEET 40X57 (DRAPES) ×4 IMPLANT
DRAPE IMP U-DRAPE 54X76 (DRAPES) ×4 IMPLANT
DRAPE INCISE IOBAN 66X45 STRL (DRAPES) IMPLANT
DRAPE ORTHO SPLIT 77X108 STRL (DRAPES) ×8
DRAPE SHOULDER BEACH CHAIR (DRAPES) ×4 IMPLANT
DRAPE STERI 35X30 U-POUCH (DRAPES) ×4 IMPLANT
DRAPE SURG ORHT 6 SPLT 77X108 (DRAPES) ×4 IMPLANT
DRAPE U-SHAPE 47X51 STRL (DRAPES) ×4 IMPLANT
DRSG EMULSION OIL 3X3 NADH (GAUZE/BANDAGES/DRESSINGS) ×8 IMPLANT
DRSG PAD ABDOMINAL 8X10 ST (GAUZE/BANDAGES/DRESSINGS) ×8 IMPLANT
DURAPREP 26ML APPLICATOR (WOUND CARE) ×4 IMPLANT
ELECT REM PT RETURN 9FT ADLT (ELECTROSURGICAL) ×4
ELECTRODE REM PT RTRN 9FT ADLT (ELECTROSURGICAL) ×2 IMPLANT
GAUZE SPONGE 4X4 12PLY STRL (GAUZE/BANDAGES/DRESSINGS) ×6 IMPLANT
GLOVE BIOGEL PI IND STRL 8 (GLOVE) ×2 IMPLANT
GLOVE BIOGEL PI INDICATOR 8 (GLOVE) ×2
GLOVE BIOGEL PI ORTHO PRO SZ8 (GLOVE) ×2
GLOVE ORTHO TXT STRL SZ7.5 (GLOVE) ×4 IMPLANT
GLOVE PI ORTHO PRO STRL SZ8 (GLOVE) ×2 IMPLANT
GLOVE SURG ORTHO 8.0 STRL STRW (GLOVE) ×4 IMPLANT
GOWN STRL REUS W/ TWL XL LVL3 (GOWN DISPOSABLE) ×2 IMPLANT
GOWN STRL REUS W/TWL 2XL LVL3 (GOWN DISPOSABLE) ×4 IMPLANT
GOWN STRL REUS W/TWL XL LVL3 (GOWN DISPOSABLE) ×4
KIT BASIN OR (CUSTOM PROCEDURE TRAY) ×4 IMPLANT
PACK ARTHROSCOPY DSU (CUSTOM PROCEDURE TRAY) ×4 IMPLANT
PAD ABD 8X10 STRL (GAUZE/BANDAGES/DRESSINGS) ×4 IMPLANT
PENCIL BUTTON HOLSTER BLD 10FT (ELECTRODE) IMPLANT
SLING ARM FOAM STRAP LRG (SOFTGOODS) ×2 IMPLANT
SLING ARM IMMOBILIZER LRG (SOFTGOODS) IMPLANT
SLING ARM IMMOBILIZER MED (SOFTGOODS) IMPLANT
SPONGE LAP 4X18 RFD (DISPOSABLE) ×4 IMPLANT
SUT ETHILON 3 0 PS 1 (SUTURE) IMPLANT
TAPE CLOTH SURG 6X10 WHT LF (GAUZE/BANDAGES/DRESSINGS) ×2 IMPLANT
TOWEL GREEN STERILE FF (TOWEL DISPOSABLE) ×4 IMPLANT
TUBE CONNECTING 12'X1/4 (SUCTIONS)
TUBE CONNECTING 12X1/4 (SUCTIONS) IMPLANT
TUBING ARTHROSCOPY IRRIG 16FT (MISCELLANEOUS) ×4 IMPLANT
WATER STERILE IRR 1000ML POUR (IV SOLUTION) ×4 IMPLANT

## 2020-03-15 NOTE — CV Procedure (Addendum)
   Transesophageal Echocardiogram  Indications: Bacteremia  Time out performed  Anesthesia present, intubated, timeout performed.  Procedure was performed in OR 4.  Dr. Dion Saucier I performed shoulder debridement prior to TEE.  Findings:  Left Ventricle: Normal ejection fraction 55 to 60%  Mitral Valve: Normal mitral valve  Aortic Valve: Normal aortic valve  Tricuspid Valve: 2 large elongated mobile vegetations, approximately 2 cm in length attached to tricuspid valve, residing in the right atrial territory.  There is severe associated tricuspid valve regurgitation.  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  Relayed findings to Dr. Daiva Eves with ID as well as Dr. Gerri Lins.    Donato Schultz, MD

## 2020-03-15 NOTE — Progress Notes (Signed)
PT Cancellation Note  Patient Details Name: Robert Kramer MRN: [id number] DOB: 1976-07-05   Cancelled Treatment:    Reason Eval/Treat Not Completed: Patient at procedure or test/unavailable (Patient at [facility] for Bilateral arthroscopic shoulder irrigation and debridement and TEE.) Will follow at later date/time as schedule allows and pt is medically ready.  Wynn Maudlin, DPT Acute Rehabilitation Services  Office [phone] Pager [phone]  03/15/2020 11:38 AM

## 2020-03-15 NOTE — Progress Notes (Addendum)
    [facility] has been requested to perform a transesophageal echocardiogram on Robert Kramer for bacteremia.  After careful review of history and examination, the risks and benefits of transesophageal echocardiogram have been explained including risks of esophageal damage, perforation (1:10,000 risk), bleeding, pharyngeal hematoma as well as other potential complications associated with conscious sedation including aspiration, arrhythmia, respiratory failure and death. Alternatives to treatment were discussed, questions were answered. Patient is willing to proceed.   Georgie Chard, NP  03/15/2020 8:49 AM   Personally seen and examined. Agree with above.   Donato Schultz, MD

## 2020-03-15 NOTE — Anesthesia Procedure Notes (Signed)
Procedure Name: Intubation Date/Time: 03/15/2020 1:36 PM Performed by: Rosiland Oz, CRNA Pre-anesthesia Checklist: Patient identified, Emergency Drugs available, Suction available, Patient being monitored and Timeout performed Patient Re-evaluated:Patient Re-evaluated prior to induction Oxygen Delivery Method: Circle system utilized Preoxygenation: Pre-oxygenation with 100% oxygen Induction Type: IV induction and Rapid sequence Laryngoscope Size: Miller and 3 Grade View: Grade I Tube size: 7.5 mm Number of attempts: 1 Airway Equipment and Method: Stylet Placement Confirmation: ETT inserted through vocal cords under direct vision,  positive ETCO2 and breath sounds checked- equal and bilateral Secured at: 22 cm Tube secured with: Tape Dental Injury: Teeth and Oropharynx as per pre-operative assessment

## 2020-03-15 NOTE — Anesthesia Preprocedure Evaluation (Addendum)
Anesthesia Evaluation  Patient identified by MRN, date of birth, ID band Patient awake    Reviewed: Allergy & Precautions, NPO status , Patient's Chart, lab work & pertinent test results  Airway Mallampati: I  TM Distance: >3 FB Neck ROM: Full    Dental no notable dental hx. (+) Poor Dentition, Dental Advisory Given   Pulmonary Current Smoker and Patient abstained from smoking.,  CTA chest PE study shows multiple cavitary and noncavitary nodules consistent with septic emboli, bilateral lower lobe pneumonia, mild bilateral hilar adenopathy felt to be reactive.   Smoking 1.5ppd x many years    Pulmonary exam normal  + decreased breath sounds      Cardiovascular negative cardio ROS Normal cardiovascular exam Rhythm:Regular Rate:Normal  TTE was performed on 03/12/2020. It has demonstratred "shaggy" vegetations on the tricuspid valve.     Neuro/Psych PSYCHIATRIC DISORDERS Anxiety Depression Bipolar Disorder negative neurological ROS  negative psych ROS   GI/Hepatic negative GI ROS, (+)     substance abuse (polysubstance abuse)  IV drug use, Last heroin use 03/11/20  receiving suboxone 8-2 mg twice daily with as needed Suboxone per opioid withdrawal protocol.    Endo/Other  negative endocrine ROS  Renal/GU negative Renal ROS  negative genitourinary   Musculoskeletal negative musculoskeletal ROS (+) Arthritis , narcotic dependentMRI of left shoulder was performed showing a glenohumeral joint effusion with synovitis suspicious for septic arthritis.    Abdominal   Peds negative pediatric ROS (+)  Hematology  (+) Blood dyscrasia, anemia , hct 24.5, plt 47- thrombocytopenia thought to be 2/2 sepsis   Anesthesia Other Findings seen in the [facility] long ED 03/06/2020 for evaluation and management of heroin withdrawal.  He was treated supportively.  Blood cultures were obtained and have now grown out MRSA and Serratia marcescens   Reproductive/Obstetrics negative OB ROS                            Anesthesia Physical Anesthesia Plan  ASA: IV  Anesthesia Plan: General   Post-op Pain Management:    Induction: Intravenous and Rapid sequence  PONV Risk Score and Plan: 1 and Ondansetron and Treatment may vary due to age or medical condition  Airway Management Planned: Oral ETT  Additional Equipment: None  Intra-op Plan:   Post-operative Plan: Extubation in OR and Possible Post-op intubation/ventilation  Informed Consent: I have reviewed the patients History and Physical, chart, labs and discussed the procedure including the risks, benefits and alternatives for the proposed anesthesia with the patient or authorized representative who has indicated his/her understanding and acceptance.     Dental advisory given  Plan Discussed with: CRNA  Anesthesia Plan Comments: (Concurrent PNA, pt feels short of breath today)       Anesthesia Quick Evaluation

## 2020-03-15 NOTE — Progress Notes (Signed)
Subjective: No new complaints   Antibiotics:  Anti-infectives (From admission, onward)   Start     Dose/Rate Route Frequency Ordered Stop   03/12/20 2000  cefTRIAXone (ROCEPHIN) 2 g in sodium chloride 0.9 % 100 mL IVPB  Status:  Discontinued        2 g 200 mL/hr over 30 Minutes Intravenous Every 24 hours 03/11/20 2058 03/12/20 0937   03/12/20 1400  [MAR Hold]  ceFEPIme (MAXIPIME) 2 g in sodium chloride 0.9 % 100 mL IVPB        (MAR Hold since Fri 03/15/2020 at 1126.Hold Reason: Transfer to a Procedural area.)   2 g 200 mL/hr over 30 Minutes Intravenous Every 8 hours 03/12/20 0937     03/12/20 0000  [MAR Hold]  vancomycin (VANCOCIN) IVPB 1000 mg/200 mL premix        (MAR Hold since Fri 03/15/2020 at 1126.Hold Reason: Transfer to a Procedural area.)   1,000 mg 200 mL/hr over 60 Minutes Intravenous Every 8 hours 03/11/20 1816     03/11/20 1830  cefTRIAXone (ROCEPHIN) 2 g in sodium chloride 0.9 % 100 mL IVPB        2 g 200 mL/hr over 30 Minutes Intravenous  Once 03/11/20 1822 03/11/20 2030   03/11/20 1545  vancomycin (VANCOREADY) IVPB 1500 mg/300 mL        1,500 mg 150 mL/hr over 120 Minutes Intravenous  Once 03/11/20 1532 03/11/20 1819      Medications: Scheduled Meds: . [MAR Hold] buprenorphine-naloxone  1 tablet Sublingual BID  . chlorhexidine  60 mL Topical Once  . chlorhexidine      . [MAR Hold] feeding supplement (ENSURE ENLIVE)  237 mL Oral BID BM  . [MAR Hold] multivitamin with minerals  1 tablet Oral Daily  . [MAR Hold] sodium chloride flush  3 mL Intravenous Q12H   Continuous Infusions: . sodium chloride    . [MAR Hold] ceFEPime (MAXIPIME) IV 2 g (03/15/20 0519)  . [MAR Hold] vancomycin 1,000 mg (03/15/20 0808)   PRN Meds:.[MAR Hold] acetaminophen **OR** [MAR Hold] acetaminophen, [MAR Hold] hydrOXYzine, [MAR Hold] loperamide, [MAR Hold] ondansetron **OR** [MAR Hold] ondansetron (ZOFRAN) IV    Objective: Weight change:   Intake/Output Summary (Last 24  hours) at 03/15/2020 1244 Last data filed at 03/15/2020 1000 Gross per 24 hour  Intake 844.13 ml  Output 625 ml  Net 219.13 ml   Blood pressure 95/60, pulse (!) 110, temperature 98.6 F (37 C), temperature source Oral, resp. rate 18, height 5\' 11"  (1.803 m), weight 70.8 kg, SpO2 95 %. Temp:  [97.5 F (36.4 C)-100 F (37.8 C)] 98.6 F (37 C) (09/10 0926) Pulse Rate:  [95-112] 110 (09/10 0926) Resp:  [18-20] 18 (09/10 0926) BP: (92-108)/(59-75) 95/60 (09/10 0926) SpO2:  [95 %-97 %] 95 % (09/10 0926)  Physical Exam: General: Alert and awake, oriented x3 HEENT: anicteric sclera, EOMI CVS tahcycardic Chest: , no wheezing, no respiratory distress Abdomen: soft non-distended,  Extremities:tendenress on left shoulder and sig limied rom Skin: no rashes Neuro: nonfocal  CBC:    BMET Recent Labs    03/14/20 0503  NA 127*  K 4.1  CL 99  CO2 24  GLUCOSE 104*  BUN 19  CREATININE 0.72  CALCIUM 7.5*     Liver Panel  No results for input(s): PROT, ALBUMIN, AST, ALT, ALKPHOS, BILITOT, BILIDIR, IBILI in the last 72 hours.     Sedimentation Rate No results for input(s): ESRSEDRATE in the  last 72 hours. C-Reactive Protein No results for input(s): CRP in the last 72 hours.  Micro Results: Recent Results (from the past 720 hour(s))  Blood culture (routine x 2)     Status: Abnormal   Collection Time: 03/06/20  6:34 AM   Specimen: BLOOD RIGHT FOREARM  Result Value Ref Range Status   Specimen Description BLOOD RIGHT FOREARM  Final   Special Requests   Final    BOTTLES DRAWN AEROBIC AND ANAEROBIC Blood Culture adequate volume   Culture  Setup Time   Final    GRAM NEGATIVE RODS AEROBIC BOTTLE ONLY CRITICAL RESULT CALLED TO, READ BACK BY AND VERIFIED WITH: S COBLE RN 1831 03/08/20 A BROWNING GRAM POSITIVE COCCI IN CLUSTERS ANAEROBIC BOTTLE ONLY CRITICAL VALUE NOTED.  VALUE IS CONSISTENT WITH PREVIOUSLY REPORTED AND CALLED VALUE. Performed at [facility], [address] [place], [place] [address]    Culture SERRATIA MARCESCENS (A)  Final   Report Status 03/10/2020 FINAL  Final   Organism ID, Bacteria SERRATIA MARCESCENS  Final      Susceptibility   Serratia marcescens - MIC*    CEFAZOLIN >=64 RESISTANT Resistant     CEFEPIME <=0.12 SENSITIVE Sensitive     CEFTAZIDIME <=1 SENSITIVE Sensitive     CEFTRIAXONE <=0.25 SENSITIVE Sensitive     CIPROFLOXACIN <=0.25 SENSITIVE Sensitive     GENTAMICIN <=1 SENSITIVE Sensitive     TRIMETH/SULFA <=20 SENSITIVE Sensitive     * SERRATIA MARCESCENS  Blood Culture ID Panel (Reflexed)     Status: Abnormal   Collection Time: 03/06/20  6:34 AM  Result Value Ref Range Status   Enterococcus faecalis NOT DETECTED NOT DETECTED Final   Enterococcus Faecium NOT DETECTED NOT DETECTED Final   Listeria monocytogenes NOT DETECTED NOT DETECTED Final   Staphylococcus species NOT DETECTED NOT DETECTED Final   Staphylococcus aureus (BCID) NOT DETECTED NOT DETECTED Final   Staphylococcus epidermidis NOT DETECTED NOT DETECTED Final   Staphylococcus lugdunensis NOT DETECTED NOT DETECTED Final   Streptococcus species NOT DETECTED NOT DETECTED Final   Streptococcus agalactiae NOT DETECTED NOT DETECTED Final   Streptococcus pneumoniae NOT DETECTED NOT DETECTED Final   Streptococcus pyogenes NOT DETECTED NOT DETECTED Final   A.calcoaceticus-baumannii NOT DETECTED NOT DETECTED Final   Bacteroides fragilis NOT DETECTED NOT DETECTED Final   Enterobacterales DETECTED (A) NOT DETECTED Final    Comment: Enterobacterales represent a large order of gram negative bacteria, not a single organism. CRITICAL RESULT CALLED TO, READ BACK BY AND VERIFIED WITH: S COBLE RN [phone] 03/08/20 A BROWNING    Enterobacter cloacae complex NOT DETECTED NOT DETECTED Final   Escherichia coli NOT DETECTED NOT DETECTED Final   Klebsiella aerogenes NOT DETECTED NOT DETECTED Final   Klebsiella oxytoca NOT DETECTED NOT DETECTED Final   Klebsiella pneumoniae NOT  DETECTED NOT DETECTED Final   Proteus species NOT DETECTED NOT DETECTED Final   Salmonella species NOT DETECTED NOT DETECTED Final   Serratia marcescens DETECTED (A) NOT DETECTED Final    Comment: CRITICAL RESULT CALLED TO, READ BACK BY AND VERIFIED WITH: S COBLE RN 1831 03/08/20 A BROWNING    Haemophilus influenzae NOT DETECTED NOT DETECTED Final   Neisseria meningitidis NOT DETECTED NOT DETECTED Final   Pseudomonas aeruginosa NOT DETECTED NOT DETECTED Final   Stenotrophomonas maltophilia NOT DETECTED NOT DETECTED Final   Candida albicans NOT DETECTED NOT DETECTED Final   Candida auris NOT DETECTED NOT DETECTED Final   Candida glabrata NOT DETECTED NOT DETECTED Final  Candida krusei NOT DETECTED NOT DETECTED Final   Candida parapsilosis NOT DETECTED NOT DETECTED Final   Candida tropicalis NOT DETECTED NOT DETECTED Final   Cryptococcus neoformans/gattii NOT DETECTED NOT DETECTED Final   CTX-M ESBL NOT DETECTED NOT DETECTED Final   Carbapenem resistance IMP NOT DETECTED NOT DETECTED Final   Carbapenem resistance KPC NOT DETECTED NOT DETECTED Final   Carbapenem resistance NDM NOT DETECTED NOT DETECTED Final   Carbapenem resist OXA 48 LIKE NOT DETECTED NOT DETECTED Final   Carbapenem resistance VIM NOT DETECTED NOT DETECTED Final    Comment: Performed at [facility], [address], [place], [place] [address]  Blood culture (routine x 2)     Status: Abnormal   Collection Time: 03/06/20  6:44 AM   Specimen: BLOOD  Result Value Ref Range Status   Specimen Description BLOOD RIGHT ANTECUBITAL  Final   Special Requests   Final    BOTTLES DRAWN AEROBIC AND ANAEROBIC Blood Culture adequate volume   Culture  Setup Time   Final    IN BOTH AEROBIC AND ANAEROBIC BOTTLES GRAM POSITIVE COCCI IN CLUSTERS CRITICAL RESULT CALLED TO, READ BACK BY AND VERIFIED WITH: L ADKINS RN 03/09/20 0219 JDW Performed at [facility], [address], [place], [place] [address]    Culture METHICILLIN  RESISTANT STAPHYLOCOCCUS AUREUS (A)  Final   Report Status 03/10/2020 FINAL  Final   Organism ID, Bacteria METHICILLIN RESISTANT STAPHYLOCOCCUS AUREUS  Final      Susceptibility   Methicillin resistant staphylococcus aureus - MIC*    CIPROFLOXACIN <=0.5 SENSITIVE Sensitive     ERYTHROMYCIN >=8 RESISTANT Resistant     GENTAMICIN <=0.5 SENSITIVE Sensitive     OXACILLIN >=4 RESISTANT Resistant     TETRACYCLINE <=1 SENSITIVE Sensitive     VANCOMYCIN 1 SENSITIVE Sensitive     TRIMETH/SULFA <=10 SENSITIVE Sensitive     CLINDAMYCIN <=0.25 SENSITIVE Sensitive     RIFAMPIN <=0.5 SENSITIVE Sensitive     Inducible Clindamycin NEGATIVE Sensitive     * METHICILLIN RESISTANT STAPHYLOCOCCUS AUREUS  Blood Culture ID Panel (Reflexed)     Status: Abnormal   Collection Time: 03/06/20  6:44 AM  Result Value Ref Range Status   Enterococcus faecalis NOT DETECTED NOT DETECTED Final   Enterococcus Faecium NOT DETECTED NOT DETECTED Final   Listeria monocytogenes NOT DETECTED NOT DETECTED Final   Staphylococcus species DETECTED (A) NOT DETECTED Final    Comment: CRITICAL RESULT CALLED TO, READ BACK BY AND VERIFIED WITH: L ADKINS RN 03/09/20 0208 JDW    Staphylococcus aureus (BCID) DETECTED (A) NOT DETECTED Final    Comment: Methicillin (oxacillin)-resistant Staphylococcus aureus (MRSA). MRSA is predictably resistant to beta-lactam antibiotics (except ceftaroline). Preferred therapy is vancomycin unless clinically contraindicated. Patient requires contact precautions if  hospitalized. CRITICAL RESULT CALLED TO, READ BACK BY AND VERIFIED WITH: L ADKINS RN 03/09/20 0208 JDW    Staphylococcus epidermidis NOT DETECTED NOT DETECTED Final   Staphylococcus lugdunensis NOT DETECTED NOT DETECTED Final   Streptococcus species NOT DETECTED NOT DETECTED Final   Streptococcus agalactiae NOT DETECTED NOT DETECTED Final   Streptococcus pneumoniae NOT DETECTED NOT DETECTED Final   Streptococcus pyogenes NOT DETECTED NOT  DETECTED Final   A.calcoaceticus-baumannii NOT DETECTED NOT DETECTED Final   Bacteroides fragilis NOT DETECTED NOT DETECTED Final   Enterobacterales NOT DETECTED NOT DETECTED Final   Enterobacter cloacae complex NOT DETECTED NOT DETECTED Final   Escherichia coli NOT DETECTED NOT DETECTED Final   Klebsiella aerogenes  NOT DETECTED NOT DETECTED Final   Klebsiella oxytoca NOT DETECTED NOT DETECTED Final   Klebsiella pneumoniae NOT DETECTED NOT DETECTED Final   Proteus species NOT DETECTED NOT DETECTED Final   Salmonella species NOT DETECTED NOT DETECTED Final   Serratia marcescens NOT DETECTED NOT DETECTED Final   Haemophilus influenzae NOT DETECTED NOT DETECTED Final   Neisseria meningitidis NOT DETECTED NOT DETECTED Final   Pseudomonas aeruginosa NOT DETECTED NOT DETECTED Final   Stenotrophomonas maltophilia NOT DETECTED NOT DETECTED Final   Candida albicans NOT DETECTED NOT DETECTED Final   Candida auris NOT DETECTED NOT DETECTED Final   Candida glabrata NOT DETECTED NOT DETECTED Final   Candida krusei NOT DETECTED NOT DETECTED Final   Candida parapsilosis NOT DETECTED NOT DETECTED Final   Candida tropicalis NOT DETECTED NOT DETECTED Final   Cryptococcus neoformans/gattii NOT DETECTED NOT DETECTED Final   Meth resistant mecA/C and MREJ DETECTED (A) NOT DETECTED Final    Comment: CRITICAL RESULT CALLED TO, READ BACK BY AND VERIFIED WITH: L ADKINS RN 03/09/20 0208 JDW Performed at [facility], [address], [place], [place] [address]   Urine culture     Status: Abnormal   Collection Time: 03/11/20  3:28 PM   Specimen: In/Out Cath Urine  Result Value Ref Range Status   Specimen Description   Final    IN/OUT CATH URINE Performed at [facility], [address] [place], [place] [address]    Special Requests   Final    NONE Performed at [facility], [address], [place], [place] [address]    Culture MULTIPLE SPECIES PRESENT, SUGGEST  RECOLLECTION (A)  Final   Report Status 03/13/2020 FINAL  Final  Blood culture (routine x 2)     Status: Abnormal   Collection Time: 03/11/20  3:31 PM   Specimen: BLOOD  Result Value Ref Range Status   Specimen Description   Final    BLOOD LEFT ARM Performed at [facility], [address], [place], [place] [address]    Special Requests   Final    BOTTLES DRAWN AEROBIC AND ANAEROBIC Blood Culture results may not be optimal due to an excessive volume of blood received in culture bottles Performed at [facility], [address] [place], [place] [address]    Culture  Setup Time   Final    GRAM POSITIVE COCCI IN CLUSTERS IN BOTH AEROBIC AND ANAEROBIC BOTTLES CRITICAL VALUE NOTED.  VALUE IS CONSISTENT WITH PREVIOUSLY REPORTED AND CALLED VALUE.    Culture (A)  Final    STAPHYLOCOCCUS AUREUS SUSCEPTIBILITIES PERFORMED ON PREVIOUS CULTURE WITHIN THE LAST 5 DAYS. Performed at [facility], [address], [place], [place] [address]    Report Status 03/14/2020 FINAL  Final  SARS Coronavirus 2 by RT PCR (hospital order, performed in [facility] hospital lab) Nasopharyngeal Nasopharyngeal Swab     Status: None   Collection Time: 03/11/20  5:05 PM   Specimen: Nasopharyngeal Swab  Result Value Ref Range Status   SARS Coronavirus 2 NEGATIVE NEGATIVE Final    Comment: (NOTE) SARS-CoV-2 target nucleic acids are NOT DETECTED.  The SARS-CoV-2 RNA is generally detectable in upper and lower respiratory specimens during the acute phase of infection. The lowest concentration of SARS-CoV-2 viral copies this assay can detect is 250 copies / mL. A negative result does not preclude SARS-CoV-2 infection and should not be used as the sole basis for treatment or other patient  management decisions.  A negative result may occur with improper specimen collection / handling, submission of specimen other than nasopharyngeal swab, presence of viral mutation(s) within  the areas targeted by this assay, and inadequate number of viral copies (<250 copies / mL). A negative result must be combined with clinical observations, patient history, and epidemiological information.  Fact Sheet for Patients:   BoilerBrush.com.cy  Fact Sheet for Healthcare Providers: https://pope.com/  This test is not yet approved or  cleared by the [place] FDA and has been authorized for detection and/or diagnosis of SARS-CoV-2 by FDA under an Emergency Use Authorization (EUA).  This EUA will remain in effect (meaning this test can be used) for the duration of the COVID-19 declaration under Section 564(b)(1) of the Act, 21 U.S.C. section 360bbb-3(b)(1), unless the authorization is terminated or revoked sooner.  Performed at [facility], [address], [place], [place] [address]   Culture, blood (Routine X 2) w Reflex to ID Panel     Status: None (Preliminary result)   Collection Time: 03/13/20 11:09 AM   Specimen: BLOOD  Result Value Ref Range Status   Specimen Description   Final    BLOOD RIGHT ANTECUBITAL Performed at [facility], [address], [place], [place] [address]    Special Requests   Final    BOTTLES DRAWN AEROBIC AND ANAEROBIC Blood Culture adequate volume Performed at [facility], [address] [place], [place] [address]    Culture   Final    NO GROWTH 2 DAYS Performed at [facility], [address], [place], [place] [address]    Report Status PENDING  Incomplete  Culture, blood (Routine X 2) w Reflex to ID Panel     Status: None (Preliminary result)   Collection Time: 03/13/20 11:10 AM   Specimen: BLOOD RIGHT HAND  Result Value Ref Range Status   Specimen Description   Final    BLOOD RIGHT HAND Performed at [facility], [address] [place], [place] [address]    Special Requests   Final    BOTTLES DRAWN  AEROBIC ONLY Blood Culture results may not be optimal due to an inadequate volume of blood received in culture bottles Performed at [facility], [address] [place], [place] [address]    Culture   Final    NO GROWTH 2 DAYS Performed at [facility], [address] [place], [place] [address]    Report Status PENDING  Incomplete    Studies/Results: MR Shoulder Left W Wo Contrast  Result Date: 03/14/2020 CLINICAL DATA:  Left shoulder pain. Clinical concern for septic arthritis EXAM: MRI OF THE LEFT SHOULDER WITHOUT AND WITH CONTRAST TECHNIQUE: Multiplanar, multisequence MR imaging of the left shoulder was performed before and after the administration of intravenous contrast. CONTRAST:  63mL GADAVIST GADOBUTROL 1 MMOL/ML IV SOLN COMPARISON:  X-ray 04/20/2014 FINDINGS: Rotator cuff: Tendinosis with irregular full-thickness non retracted tear of the anterior supraspinatus tendon (series 7, images 8-9; series 10, images 10-12). Infraspinatus, subscapularis, and teres minor tendons are intact. Muscles: There is intramuscular edema within the rotator cuff musculature, most pronounced within the supraspinatus and subscapularis muscles. There is also intramuscular edema within the anterior and lateral deltoid muscles. No muscle atrophy or fatty infiltration. Biceps long head:  Intact biceps tendon with tenosynovitis. Acromioclavicular Joint: Mild arthropathy of the AC joint. Small volume subacromial-subdeltoid bursal fluid. Glenohumeral Joint: Moderate-sized glenohumeral joint effusion with enhancing synovitis. There  is fluid distension within the subscapularis joint recess. No cartilage defect. Labrum:  Intact. Bones: No bony erosion. No bone marrow edema. No acute fracture or dislocation. Other: No organized soft tissue fluid collection. Partially visualized opacities within the visualized lung field. IMPRESSION: 1. Moderate-sized glenohumeral joint effusion with enhancing synovitis,  suspicious for septic arthritis in the setting of known systemic infection. Arthrocentesis is recommended. 2. No evidence of osteomyelitis. 3. Intramuscular edema within the rotator cuff musculature, most pronounced within the supraspinatus and subscapularis muscles. Findings are nonspecific and may reflect myositis. No intramuscular abscess. 4. Intact biceps tendon with tenosynovitis. 5. Supraspinatus tendinosis with focal full-thickness non-retracted tear anteriorly. 6. Mild AC joint arthropathy. These results will be called to the ordering clinician or representative by the Radiologist Assistant, and communication documented in the PACS or [employer] Dashboard. Electronically Signed   By: Duanne GuessNicholas  Plundo D.O.   On: 03/14/2020 10:25      Assessment/Plan:  INTERVAL HISTORY: [facility] I and D of shoulders and TEE   Principal Problem:   Severe sepsis due to methicillin resistant Staphylococcus aureus (MRSA) with acute organ dysfunction (HCC) Active Problems:   Polysubstance dependence (HCC)   Hypokalemia   Thrombocytopenia (HCC)   Serratia septicemia (HCC)   Bipolar 1 disorder (HCC)   Heroin abuse (HCC)   MRSA bacteremia    Robert Kramer is a 44 y.o. 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 His MRI left shoulder shows evident septic joint. His right shoulder is also with limited ROM and tenderness  #1 Polymicrobial bacteremia with endocarditis --continue current abx --repeat blood cultures --TEE and he will need CVTS consult --look for metastatic sources of infection and Orthopedics are going to I and D both shoulder to help with gaining control of the infection  --I also ordered MRI of L spine  #2 Septic shoulders: having surgeries today  #2 HCV: checking labs RNA pending. He is HBV and HAV immune  #3 IVDU: will need plan for this.  Dr. Ninetta LightsHatcher will followup culture data and studies and is available for  questions. He will see the patient on Monday at [facility].   LOS: 4 days   Acey LavCornelius Van Dam 03/15/2020, 12:44 PM

## 2020-03-15 NOTE — Progress Notes (Signed)
Assumed patient care responsibility at 0300, agree with previous nursing assessment.

## 2020-03-15 NOTE — Progress Notes (Signed)
PROGRESS NOTE  Robert Kramer GGE:[id number] DOB: 04-27-1976 DOA: 03/11/2020 PCP: Patient, No Pcp Per  Brief History   Robert Kramer is a 44 y.o. male with medical history significant for polysubstance use disorder including injection drug use and bipolar disorder who presents to the ED for evaluation of abnormal blood cultures.   Patient was seen in the [place] [facility] ED 03/06/2020 for evaluation and management of heroin withdrawal.  He was treated supportively.  Blood cultures were obtained and have now grown out MRSA and Serratia marcescens.  He was called today to come to the ED for further evaluation.   Patient states he is an IV drug user injecting heroin at any site in his arms or hands that he can gain access.  He reports last use 3 days ago.  He has been having ongoing fevers, chills, diaphoresis, shortness of breath, nonproductive cough, nausea, abdominal pain, and generally feeling unwell.  He feels as if he is about to go into opioid withdrawal.  He says he has used Suboxone in the past with benefit.  He denies any other illicit drug use or alcohol use.   ED Course:  Initial vitals showed BP 121/73, pulse 125, RR 24, temp 98.1 Fahrenheit, SPO2 98% on room air.   Labs show WBC 13.7, hemoglobin 10.7, platelets 32,000 (51,000 on 03/05/2020), sodium 134, potassium 3.1, bicarb 24, BUN 21, creatinine 0.81, serum glucose 141, AST 67, ALT 32, alk phos 76, total bilirubin 1.5, lactic acid 2.0 >> 2.5.   Blood and urine cultures were obtained and pending.  SARS-CoV-2 PCR is negative.   Portable chest x-ray shows multiple areas of hazy airspace opacities with cavitation in the inferior right upper lobe.   CTA chest PE study shows multiple cavitary and noncavitary nodules consistent with septic emboli, bilateral lower lobe pneumonia, mild bilateral hilar adenopathy felt to be reactive.  No plan for pulmonary emboli reported.   Patient was given 2.25 L LR, oral K 40 mEq, IV Ativan 1 mg once, and started  on IV vancomycin and ceftriaxone.  The hospitalist service was consulted to admit for further evaluation and management.  TTE was performed on 03/12/2020. It has demonstratred "shaggy" vegetations on the tricuspid valve. TEE was recommended to further clarify the problem.  MRI of left shoulder demonstrated septic arthropathy, myositis, and tenosynovitis. I discussed the patient with Dr. Mardelle Matte. He recommended having the joint aspirated by IR and call him with results. I have discussed the patient with Dr. Anselm Pancoast. He will try to work the patient in.   Today the patient was transferred to [facility] for the day. He has undergone a TEE which demonstrated 2 2cm long vegetations on the tricuspid valve with resultant severe TR. 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  He has returned to [facility] from [facility]. Cardiology has been consulted. Order to transfer to [facility] for CTS eval has been placed. No beds available today.  Consultants  . Infectious Disease . Orthopedic surgery . Interventional Radiology . Cardiology  Procedures  . None  Antibiotics   Anti-infectives (From admission, onward)   Start     Dose/Rate Route Frequency Ordered Stop   03/12/20 2000  cefTRIAXone (ROCEPHIN) 2 g in sodium chloride 0.9 % 100 mL IVPB  Status:  Discontinued        2 g 200 mL/hr over 30 Minutes Intravenous Every 24 hours 03/11/20 2058 03/12/20 0937   03/12/20  1400  ceFEPIme (MAXIPIME) 2 g in sodium chloride 0.9 % 100 mL IVPB        2 g 200 mL/hr over 30 Minutes Intravenous Every 8 hours 03/12/20 0937     03/12/20 0000  vancomycin (VANCOCIN) IVPB 1000 mg/200 mL premix        1,000 mg 200 mL/hr over 60 Minutes Intravenous Every 8 hours 03/11/20 1816     03/11/20 1830  cefTRIAXone (ROCEPHIN) 2 g in sodium chloride 0.9 % 100 mL IVPB        2 g 200 mL/hr over 30 Minutes Intravenous  Once 03/11/20 1822 03/11/20 2030   03/11/20 1545  vancomycin  (VANCOREADY) IVPB 1500 mg/300 mL        1,500 mg 150 mL/hr over 120 Minutes Intravenous  Once 03/11/20 1532 03/11/20 1819     Subjective  The patient is lying in bed. No new complaints.  Objective   Vitals:  Vitals:   03/15/20 1630 03/15/20 1714  BP:  (!) 88/62  Pulse: (!) 115 (!) 102  Resp: (!) 26 20  Temp:  97.6 F (36.4 C)  SpO2: 99% 96%   Exam:  Constitutional:  The patient is awake, alert, and oriented x 3. No acute distress. Respiratory:  . No increased work of breathing. . No wheezes, rales, or rhonchi . No tactile fremitus Cardiovascular:  . Regular rate and rhythm . No murmurs, ectopy, or gallups. . No lateral PMI. No thrills. Abdomen:  . Abdomen is soft, non-tender, non-distended . No hernias, masses, or organomegaly . Normoactive bowel sounds.  Musculoskeletal:  . No cyanosis, clubbing, or edema Skin:  . No rashes, lesions, ulcers . palpation of skin: no induration or nodules Neurologic:  . CN 2-12 intact . Sensation all 4 extremities intact Psychiatric:  . Mental status o Mood, affect appropriate o Orientation to person, place, time  . judgment and insight appear intact  I have personally reviewed the following:   Today's Data  . Vitals, BMP, CBC  Micro Data  . Blood Cultures x 2: Serratia Marsescens, MRSA   Cardiology Data  . TTE: Tricuspid vegetations . TEE: Pending. MRI Left Shoulder: Septic arthropathy, myositis, tenosynovitis  Scheduled Meds: . buprenorphine-naloxone  1 tablet Sublingual BID  . chlorhexidine      . feeding supplement (ENSURE ENLIVE)  237 mL Oral BID BM  . multivitamin with minerals  1 tablet Oral Daily  . sodium chloride flush  3 mL Intravenous Q12H   Continuous Infusions: . ceFEPime (MAXIPIME) IV 2 g (03/15/20 0519)  . vancomycin 1,000 mg (03/15/20 6073)    Principal Problem:   Severe sepsis due to methicillin resistant Staphylococcus aureus (MRSA) with acute organ dysfunction (HCC) Active Problems:    Polysubstance dependence (HCC)   Hypokalemia   Thrombocytopenia (HCC)   Serratia septicemia (HCC)   Bipolar 1 disorder (HCC)   Heroin abuse ([place])   MRSA bacteremia   LOS: 4 days   A & P  Severe sepsis due to MRSA and Serratia bacteremia with pulmonary septic emboli and pneumonia, bacteremia and tricuspid endocarditis: Presenting with tachypnea with RR up to 32, tachycardia up to 125, and evidence of endorgan damage with platelet count of 32,000.  Blood cultures from 03/06/2020 grew out MRSA and Serratia.  CT chest shows multiple cavitary and noncavitary septic emboli and bilateral lower lobe pneumonia.  He has been started on appropriate antibiotics. The patient has been continued on IV vancomycin to treat MRSA and IV ceftriaxone to treat Serratia. Echocardiogram  demonstrated tricuspid vegetations.  Repeat blood blood culture was collected on 9/6. It has grown out MRSA.   Endocarditis: "Shaggy" Vegetations seen on tricuspid valve in echocardiogram. TEE performed today demonstrated 2 2cm long vegetations with severe TR. Cardiology has been consulted. Order to transfer to [facility] for CTS eval has been placed. No beds available today.  Septic Left Shoulder: MRI of left shoulder demonstrated septic arthropathy, myositis, and tenosynovitis. I discussed the patient with Dr. Mardelle Matte. The patient underwent Left shoulder arthroscopy with extensive debridement, irrigation and debridment, excisional debridement of the labrum, and bursa. Left shoulder subacromial bursectomy. Recommendations are for the patient to be active with the shoulder as much as he can tolerate, okay to bear weight and lift the left arm. PT/OT will be asked to eval and treat the patient.   Substance use disorder: Reports ongoing IV heroin use, last use 3 days prior to this admission.  He is at risk to go into opioid withdrawal. The patient is receiving suboxone 8-2 mg twice daily with as needed Suboxone per opioid withdrawal protocol. He is  also receiving supportive care with Tylenol, antiemetics, Imodium, Atarax as needed. TOC consult ordered for substance abuse rehab referrals. Check HIV and hepatitis C antibodies.  Hyponatremia at 127: Likely chronic. Of no particular clinical significance at this point. Monitor.  Anemia: Hemoglobin has dropped from 9.6 to 8.1. Will check FOBT and anemia profile. Transfuse for hemoglobin less than 7.0.   Thrombocytopenia:Likely due to sepsis.  No obvious bleeding. Avoid blood thinners and continue to monitor.   Hypokalemia:  Resolve. Supplement and monitor.  Magnesium is 2.0.   Bipolar/depression/anxiety: Not currently on medical therapy.  I have seen and examined this patient myself. I have spent 33 minutes in his evaluation and care.   DVT prophylaxis: SCDs Code Status: Full code Family Communication: Discussed with patient, he has discussed with family Disposition Plan: From home, discharge pending further work-up and management of bacteremia  Admission status:  Status is: Inpatient   Remains inpatient appropriate because:IV treatments appropriate due to intensity of illness or inability to take PO and Inpatient level of care appropriate due to severity of illness     Dispo: The patient is from: Home              Anticipated d/c is to: TBD pending clinical course, may need to go to SNF for prolonged IV antibiotics              Anticipated d/c date is: > 3 days              Patient currently is not medically stable to d/c.   Lio Wehrly, DO [facility] Direct contact: see [email or web address]  7PM-7AM contact night coverage as above 03/15/2020, 5:40 PM  LOS: 1 day

## 2020-03-15 NOTE — Op Note (Signed)
03/11/2020 - 03/15/2020  3:09 PM  PATIENT:  Robert Kramer    PRE-OPERATIVE DIAGNOSIS: Septic left shoulder, joint, and subacromial space  POST-OPERATIVE DIAGNOSIS:  Same  PROCEDURE:   1.  Left shoulder arthroscopy with extensive debridement, irrigation and debridement, excisional debridement of the labrum, and bursa using an arthroscopic shaver 2.  Left shoulder subacromial bursectomy  SURGEON:  Eulas Post, MD  PHYSICIAN ASSISTANT: Daun Peacock, PA-C, present and scrubbed throughout the case, critical for completion in a timely fashion, and for retraction, instrumentation, and closure.  ANESTHESIA:   General  Specimens: Intra-articular joint fluid x1, 5 mL, sent for Gram stain culture and sensitivity  PREOPERATIVE INDICATIONS:  ROONEY SWAILS is a  44 y.o. male IV drug user with MRSA bacteremia who presented with swelling and pain in the left shoulder.  Initially he also reported right shoulder pain off, although on the day of surgery, his right shoulder pain had completely resolved.  Left shoulder had active motion 0 to 90 degrees with pain.  MRI demonstrated synovitis and myositis as well as subacromial and anterior shoulder bursal fluid collection.  He elected for surgical management.  The risks benefits and alternatives were discussed with the patient preoperatively including but not limited to the risks of infection, bleeding, nerve injury, cardiopulmonary complications, recurrent infection, the need for revision surgery, among others, and the patient was willing to proceed.  ESTIMATED BLOOD LOSS: Minimal  OPERATIVE IMPLANTS: None  OPERATIVE FINDINGS: The shoulder had full motion during examination under anesthesia.  The glenohumeral articular cartilage had some grade 1 changes, he had a fair amount of hypermobility throughout the shoulder.  There was fibrinous debris within the shoulder itself as well as in the subacromial space.  He had poor quality supraspinatus tissue at  the anterior leading edge with partial thickness, and may be even a small full-thickness defect, however there was no exposed footprint.  Mild subacromial fraying.  OPERATIVE PROCEDURE: The patient was brought to the operating room and placed in the supine position.  General anesthesia was administered.  IV antibiotics were being given according to his regimen.  He was placed in the beachchair position.  The left upper extremity was prepped and draped in usual sterile fashion.  Timeout performed.  Diagnostic arthroscopy was carried out after I used a spinal needle to aspirate the joint and got about 5 cc of turbid joint fluid.  I used the arthroscopic shaver to debride any fibrinous exudate throughout the joint as well as some of the undersurface of the supraspinatus tear, and some of the anterior and superior labrum.  A total of 6 L were irrigated through the joint.  I then irrigated another 6 L through the subacromial space and anterior aspect of the shoulder, performing a thorough bursectomy, the tissue was so soft and flimsy it would draw up into the shaver during the debridement, I had to be careful not to remove normal tissue because it simply would clog the shaver and fill out.  I am not sure if some of the infection had already destroyed some of the tissue of the tendon.  Nonetheless after complete debridement was carried out, the portals were left slightly open with a Steri-Strip, allowing fluid to drain.  Sterile gauze was applied and the patient was then placed in the supine position and cardiology was contacted to complete the work-up of his transesophageal echocardiogram.  He will be able to be active with the shoulder is much as he can tolerate, okay  to bear weight, okay to lift the arm, activity with PT and OT.

## 2020-03-15 NOTE — Progress Notes (Signed)
Spoke with Cards Master regarding TEE and patient's need for bilateral shoulder irrigation and debridement. Will plan for patient to be transferred to [facility] for Bilateral arthroscopic shoulder irrigation and debridement, this will be followed by the TEE while patient is under anesthesia and patient will be transferred back to [facility] once these have been completed. Patient should remain NPO today in anticipation of these procedures.

## 2020-03-15 NOTE — Transfer of Care (Signed)
Immediate Anesthesia Transfer of Care Note  Patient: Robert Kramer  Procedure(s) Performed: ARTHROSCOPY SHOULDER, IRRIGATION AND DEBRIDEMENT OF SHOULDER BURSECTOMY (Left Shoulder) TRANSESOPHAGEAL ECHOCARDIOGRAM (TEE) (N/A )  Patient Location: PACU  Anesthesia Type:General  Level of Consciousness: oriented, drowsy and patient cooperative  Airway & Oxygen Therapy: Patient Spontanous Breathing and Patient connected to face mask oxygen  Post-op Assessment: Report given to RN and Post -op Vital signs reviewed and stable  Post vital signs: Reviewed and stable  Last Vitals:  Vitals Value Taken Time  BP 109/72 03/15/20 1532  Temp    Pulse 97 03/15/20 1539  Resp 17 03/15/20 1539  SpO2 100 % 03/15/20 1539  Vitals shown include unvalidated device data.  Last Pain:  Vitals:   03/15/20 1148  TempSrc:   PainSc: 10-Worst pain ever      Patients Stated Pain Goal: 2 (03/15/20 1148)  Complications: No complications documented.

## 2020-03-15 NOTE — Progress Notes (Signed)
Patient seen and examined.  Today, he has no shoulder pain on the right side, all of it is on the left side, right shoulder active motion 0 to 175 degrees with a smooth arc of motion, with no redness or swelling.  Left shoulder has moderate soft tissue swelling, with a little bit of warmth, active motion 0 to 90 degrees with significant pain.  MRI imaging is concerning for septic left shoulder joint, as well as fluid in the anterior extra-articular space.  Plan for left shoulder arthroscopy, extensive debridement, irrigation and debridement, bursectomy and debridement of the subacromial space as well as the joint.  The risks benefits and alternatives been discussed at length including not limited the risks of recurrent infection, progression of osteomyelitis, incomplete relief of symptoms, among others and he is willing to proceed.  He also has significant risk for bleeding, given his low platelet count, and we will monitor for excessive bleeding and transfuse platelets if necessary.  Teryl Lucy, MD

## 2020-03-15 NOTE — Progress Notes (Signed)
  Echocardiogram Echocardiogram Transesophageal has been performed.  Gerda Diss 03/15/2020, 3:41 PM

## 2020-03-16 ENCOUNTER — Inpatient Hospital Stay (HOSPITAL_COMMUNITY): Payer: Self-pay

## 2020-03-16 LAB — CBC WITH DIFFERENTIAL/PLATELET
Abs Immature Granulocytes: 0.1 10*3/uL — ABNORMAL HIGH (ref 0.00–0.07)
Abs Immature Granulocytes: 0.19 10*3/uL — ABNORMAL HIGH (ref 0.00–0.07)
Basophils Absolute: 0 10*3/uL (ref 0.0–0.1)
Basophils Absolute: 0 10*3/uL (ref 0.0–0.1)
Basophils Relative: 0 %
Basophils Relative: 0 %
Eosinophils Absolute: 0 10*3/uL (ref 0.0–0.5)
Eosinophils Absolute: 0.1 10*3/uL (ref 0.0–0.5)
Eosinophils Relative: 0 %
Eosinophils Relative: 1 %
HCT: 21.6 % — ABNORMAL LOW (ref 39.0–52.0)
HCT: 22.5 % — ABNORMAL LOW (ref 39.0–52.0)
Hemoglobin: 7.2 g/dL — ABNORMAL LOW (ref 13.0–17.0)
Hemoglobin: 7.7 g/dL — ABNORMAL LOW (ref 13.0–17.0)
Immature Granulocytes: 1 %
Immature Granulocytes: 2 %
Lymphocytes Relative: 14 %
Lymphocytes Relative: 12 %
Lymphs Abs: 1.4 10*3/uL (ref 0.7–4.0)
Lymphs Abs: 1.5 10*3/uL (ref 0.7–4.0)
MCH: 28 pg (ref 26.0–34.0)
MCH: 27.8 pg (ref 26.0–34.0)
MCHC: 33.3 g/dL (ref 30.0–36.0)
MCHC: 34.2 g/dL (ref 30.0–36.0)
MCV: 84 fL (ref 80.0–100.0)
MCV: 81.2 fL (ref 80.0–100.0)
Monocytes Absolute: 0.6 10*3/uL (ref 0.1–1.0)
Monocytes Absolute: 0.9 10*3/uL (ref 0.1–1.0)
Monocytes Relative: 6 %
Monocytes Relative: 7 %
Neutro Abs: 7.5 10*3/uL (ref 1.7–7.7)
Neutro Abs: 9.9 10*3/uL — ABNORMAL HIGH (ref 1.7–7.7)
Neutrophils Relative %: 79 %
Neutrophils Relative %: 78 %
Platelets: 61 10*3/uL — ABNORMAL LOW (ref 150–400)
Platelets: 78 10*3/uL — ABNORMAL LOW (ref 150–400)
RBC: 2.57 MIL/uL — ABNORMAL LOW (ref 4.22–5.81)
RBC: 2.77 MIL/uL — ABNORMAL LOW (ref 4.22–5.81)
RDW: 14.1 % (ref 11.5–15.5)
RDW: 13.8 % (ref 11.5–15.5)
WBC: 9.6 10*3/uL (ref 4.0–10.5)
WBC: 12.5 10*3/uL — ABNORMAL HIGH (ref 4.0–10.5)
nRBC: 0 % (ref 0.0–0.2)
nRBC: 0 % (ref 0.0–0.2)

## 2020-03-16 LAB — TYPE AND SCREEN
ABO/RH(D): O POS
Antibody Screen: NEGATIVE

## 2020-03-16 LAB — BASIC METABOLIC PANEL
Anion gap: 9 (ref 5–15)
Anion gap: 6 (ref 5–15)
BUN: 21 mg/dL — ABNORMAL HIGH (ref 6–20)
BUN: 16 mg/dL (ref 6–20)
CO2: 24 mmol/L (ref 22–32)
CO2: 25 mmol/L (ref 22–32)
Calcium: 8.1 mg/dL — ABNORMAL LOW (ref 8.9–10.3)
Calcium: 7.9 mg/dL — ABNORMAL LOW (ref 8.9–10.3)
Chloride: 97 mmol/L — ABNORMAL LOW (ref 98–111)
Chloride: 97 mmol/L — ABNORMAL LOW (ref 98–111)
Creatinine, Ser: 0.99 mg/dL (ref 0.61–1.24)
Creatinine, Ser: 0.54 mg/dL — ABNORMAL LOW (ref 0.61–1.24)
GFR calc Af Amer: >60 mL/min (ref >60)
GFR calc Af Amer: >60 mL/min (ref >60)
GFR calc non Af Amer: >60 mL/min (ref >60)
GFR calc non Af Amer: >60 mL/min (ref >60)
Glucose, Bld: 134 mg/dL — ABNORMAL HIGH (ref 70–99)
Glucose, Bld: 100 mg/dL — ABNORMAL HIGH (ref 70–99)
Potassium: 5.4 mmol/L — ABNORMAL HIGH (ref 3.5–5.1)
Potassium: 5.1 mmol/L (ref 3.5–5.1)
Sodium: 130 mmol/L — ABNORMAL LOW (ref 135–145)
Sodium: 128 mmol/L — ABNORMAL LOW (ref 135–145)

## 2020-03-16 MED ORDER — KETOROLAC TROMETHAMINE 15 MG/ML IJ SOLN
15.0000 mg | Freq: Four times a day (QID) | INTRAMUSCULAR | Status: AC | PRN
  Filled 2020-03-16: qty 1

## 2020-03-16 MED ORDER — METHOCARBAMOL 500 MG PO TABS
500.0000 mg | ORAL_TABLET | Freq: Three times a day (TID) | ORAL | Status: DC | PRN
  Administered 2020-03-16: 500 mg via ORAL
  Filled 2020-03-16 (×2): qty 1

## 2020-03-16 MED ORDER — GADOBUTROL 1 MMOL/ML IV SOLN
7.0000 mL | Freq: Once | INTRAVENOUS | Status: AC | PRN
  Administered 2020-03-16: 7 mL via INTRAVENOUS

## 2020-03-16 NOTE — Progress Notes (Signed)
[facility] Progress Note  Patient: Robert Kramer    NWG:[id number]  DOA: 03/11/2020     Date of Service: the patient was seen and examined on 03/16/2020  Brief hospital course: Past medical history of polysubstance abuse and bipolar disorder.  Presents with symptoms of withdrawal found to MRSA and Serratia bacteremia with infected endocarditis. Currently plan is continue antibiotics.  Assessment and Plan: 1.  Severe sepsis POA. MRSA and Serratia bacteremia. Pulmonary septic emboli. Infected endocarditis involving tricuspid valve. Left shoulder septic arthritis. On presentation tachypnea tachycardia with endorgan damage and thrombocytopenia. Blood cultures positive. Repeat cultures so far negative. ID consulted. Underwent left shoulder arthroscopy with debridement irrigation, for septic arthritis with orthopedics. Echocardiogram shows tricuspid vegetation. TEE shows size 2 cm with severe TR. Current thoracic surgery consulted and patient will be transferred to [facility] for further evaluation but currently bed are not available. MRI lumbar spine performed shows no evidence of epidural abscess. Patient refused MRI right shoulder ordered by ID.  2.  Heroin use disorder. Started on Suboxone. Continue supportive care. Robaxin. Low-dose Toradol.  3.  Thrombocytopenia Secondary to severe sepsis. Monitor while the patient is having Toradol.  4.  Bipolar disorder. Depression. Anxiety. Can add gabapentin.  Monitor.  5.  Anemia likely nutritional deficiency No acute bleeding. Monitor potential transfer hemoglobin less than 7.  6.  Hyponatremia From poor p.o. intake. Possibility of SIADH. Monitor.  Diet: Regular diet DVT Prophylaxis:   SCDs Start: 03/11/20 2055    Advance goals of care discussion: Full code  Family Communication: no family was present at bedside, at the time of interview.   Disposition:  Status is: Inpatient  Remains inpatient  appropriate because:Inpatient level of care appropriate due to severity of illness   Dispo: The patient is from: Home              Anticipated d/c is to: Home              Anticipated d/c date is: > 3 days              Patient currently is not medically stable to d/c.  Subjective: Reports back pain.  No nausea no vomiting.  Also reports left shoulder pain but no right shoulder pain. No fever no chills.  Minimal oral intake.  Physical Exam:  General: Appear in mild distress, no Rash; Oral Mucosa Clear, moist. no Abnormal Neck Mass Or lumps, Conjunctiva normal  Cardiovascular: S1 and S2 Present, aortic systolic murmur Respiratory: increased respiratory effort, Bilateral Air entry present and CTA, bilateral  Crackles, no wheezes Abdomen: Bowel Sound present, Soft and no tenderness Extremities: no Pedal edema Neurology: alert and oriented to time, place, and person affect appropriate. no new focal deficit Gait not checked due to patient safety concerns  Vitals:   03/16/20 1053 03/16/20 1121 03/16/20 1442 03/16/20 1752  BP: 94/67  101/67 99/66  Pulse: (!) 106  (!) 116 (!) 113  Resp: (!) 22 18 18 20   Temp: (!) 97.3 F (36.3 C)  99.5 F (37.5 C) 99.5 F (37.5 C)  TempSrc: Oral  Oral Oral  SpO2: 96%  92% 93%  Weight:      Height:        Intake/Output Summary (Last 24 hours) at 03/16/2020 2001 Last data filed at 03/16/2020 1755 Gross per 24 hour  Intake 1400 ml  Output 1225 ml  Net 175 ml   Filed Weights   03/11/20 1521  Weight: 70.8 kg  Data Reviewed: I have personally reviewed and interpreted daily labs, tele strips, imagings as discussed above. I reviewed all nursing notes, pharmacy notes, vitals, pertinent old records I have discussed plan of care as described above with RN and patient/family.  CBC: Recent Labs  Lab 03/11/20 1536 03/12/20 0400 03/14/20 0503 03/16/20 0611 03/16/20 1244  WBC 13.7* 12.6* 15.6* 9.6 12.5*  NEUTROABS 12.5*  --  12.2* 7.5 9.9*  HGB  10.7* 9.6* 8.1* 7.2* 7.7*  HCT 31.8* 30.0* 24.5* 21.6* 22.5*  MCV 83.7 87.7 85.7 84.0 81.2  PLT 32* 26* 47* 61* 78*   Basic Metabolic Panel: Recent Labs  Lab 03/11/20 1536 03/11/20 2033 03/12/20 0400 03/14/20 0503 03/16/20 0611 03/16/20 1244  NA 134*  --  134* 127* 130* 128*  K 3.1*  --  4.4 4.1 5.4* 5.1  CL 101  --  102 99 97* 97*  CO2 24  --  26 24 24 25   GLUCOSE 141*  --  114* 104* 134* 100*  BUN 21*  --  20 19 21* 16  CREATININE 0.81  --  0.52* 0.72 0.99 0.54*  CALCIUM 7.9*  --  7.8* 7.5* 8.1* 7.9*  MG  --  2.0  --   --   --   --     Studies: MR Lumbar Spine W Wo Contrast  Result Date: 03/16/2020 CLINICAL DATA:  Severe sepsis due to MR SA.  Epidural abscess. EXAM: MRI LUMBAR SPINE WITHOUT AND WITH CONTRAST TECHNIQUE: Multiplanar and multiecho pulse sequences of the lumbar spine were obtained without and with intravenous contrast. CONTRAST:  9mL GADAVIST GADOBUTROL 1 MMOL/ML IV SOLN COMPARISON:  None. FINDINGS: Segmentation: 5 non rib-bearing lumbar type vertebral bodies are present. The lowest fully formed vertebral body is L5. Alignment:  No significant listhesis is present. Vertebrae: Edematous endplate marrow changes are present on the right at L4-5 with subtle associated enhancement. Although there is T2 signal in the disc space at L4-5, no enhancement is associated. Enhancement is noted in the paraspinous soft tissues on the right at L4-5 without a discrete abscess. No enhancement is present within the musculature. Conus medullaris and cauda equina: Conus extends to the L1 level. Conus and cauda equina appear normal. Paraspinal and other soft tissues: Asymmetric T2 signal and enhancement is present in the right paraspinous space at L4-5. No signal changes or enhancement are present in the adjacent muscle. Disc levels: L1-2: Negative. L2-3: Negative. L3-4: Disc desiccation and diffuse bulging is present without significant focal disc protrusion or stenosis. L4-5: A rightward disc  protrusion is present. The central canal is patent. Disc extends into the right foramen with mild right foraminal stenosis. L5-S1: Negative. IMPRESSION: 1. Edematous endplate marrow changes and enhancement on the right at L4-5 likely represents degenerative change. No definite infection is present. 2. Rightward disc protrusion at L4-5 with mild right foraminal stenosis. 3. Enhancement in the right paraspinous soft tissues at L4-5 without a discrete abscess. This may be reactive. No definite infection is present. 4. Disc desiccation and bulging at L3-4 without significant stenosis. Electronically Signed   By: 11m M.D.   On: 03/16/2020 14:34    Scheduled Meds: . buprenorphine-naloxone  1 tablet Sublingual BID  . feeding supplement (ENSURE ENLIVE)  237 mL Oral BID BM  . multivitamin with minerals  1 tablet Oral Daily  . sodium chloride flush  3 mL Intravenous Q12H   Continuous Infusions: . ceFEPime (MAXIPIME) IV 2 g (03/16/20 1504)  . vancomycin 1,000 mg (03/16/20  1854)   PRN Meds: acetaminophen **OR** acetaminophen, hydrOXYzine, ketorolac, loperamide, methocarbamol, ondansetron **OR** ondansetron (ZOFRAN) IV  Time spent: 35 minutes  Author: Lynden Oxford, MD [facility] 03/16/2020 8:01 PM  To reach On-call, see care teams to locate the attending and reach out via [email or web address]. Between 7PM-7AM, please contact night-coverage If you still have difficulty reaching the attending provider, please page the [facility] (Director on Call) for [facility] on amion for assistance.

## 2020-03-16 NOTE — Evaluation (Signed)
Physical Therapy Evaluation Patient Details Name: Robert Kramer MRN: [id number] DOB: [birth year] Today's Date: 03/16/2020   History of Present Illness  44 y.o. 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  .  Clinical Impression  Pt admitted with above diagnosis.  Pt agreeable to sitting EOB and standing. Was up in chair earlier today. Requiring ~ min assist overall, limited amb d/t c/o shoulder pain. Will continue to follow in acute setting  Pt currently with functional limitations due to the deficits listed below (see PT Problem List). Pt will benefit from skilled PT to increase their independence and safety with mobility to allow discharge to the venue listed below.     Follow Up Recommendations No PT follow up    Equipment Recommendations  None recommended by PT    Recommendations for Other Services       Precautions / Restrictions Precautions Precautions: Fall Restrictions Weight Bearing Restrictions: No      Mobility  Bed Mobility Overal bed mobility: Needs Assistance Bed Mobility: Supine to Sit     Supine to sit: Min guard     General bed mobility comments: incr time, min/guard for safety  Transfers Overall transfer level: Needs assistance Equipment used: 1 person hand held assist Transfers: Sit to/from Stand Sit to Stand: Min assist;Min guard         General transfer comment: incr time, steadying assist to  rise  Ambulation/Gait             General Gait Details: lateral steps along EOB only (pt too fatigued and with c/o shoulder  pain in standing). HR up to 115 with standing  Stairs            Wheelchair Mobility    Modified Rankin (Stroke Patients Only)       Balance Overall balance assessment: Needs assistance           Standing balance-Leahy Scale: Fair                               Pertinent Vitals/Pain Pain Assessment: Faces Faces Pain Scale: Hurts even more Pain Location: L shoulder Pain Descriptors / Indicators: Sore;Grimacing Pain Intervention(s): Limited activity within patient's tolerance;Monitored during session;Repositioned    Home Living Family/patient expects to be discharged to:: Private residence                 Additional Comments: pt states he stays with his sister or brother (?)    Prior Function Level of Independence: Independent               Hand Dominance        Extremity/Trunk Assessment   Upper Extremity Assessment Upper Extremity Assessment: Defer to OT evaluation    Lower Extremity Assessment Lower Extremity Assessment: Generalized weakness       Communication   Communication: No difficulties  Cognition Arousal/Alertness: Awake/alert Behavior During Therapy: WFL for tasks assessed/performed Overall Cognitive Status: Within Functional Limits for  tasks assessed                                        General Comments      Exercises     Assessment/Plan    PT Assessment Patient needs continued PT services  PT Problem List Decreased strength;Decreased mobility;Decreased activity tolerance;Pain       PT Treatment Interventions DME instruction;Therapeutic exercise;Gait training;Functional mobility training;Therapeutic activities;Patient/family education    PT Goals (Current goals can be found in the Care Plan section)  Acute Rehab PT Goals PT Goal Formulation: With  patient Time For Goal Achievement: 03/30/20 Potential to Achieve Goals: Good    Frequency Min 3X/week   Barriers to discharge        Co-evaluation               AM-PAC PT "6 Clicks" Mobility  Outcome Measure Help needed turning from your back to your side while in a flat bed without using bedrails?: A Little Help needed moving from lying on your back to sitting on the side of a flat bed without using bedrails?: A Little Help needed moving to and from a bed to a chair (including a wheelchair)?: A Little Help needed standing up from a chair using your arms (e.g., wheelchair or bedside chair)?: A Little Help needed to walk in hospital room?: A Lot Help needed climbing 3-5 steps with a railing? : A Lot 6 Click Score: 16    End of Session   Activity Tolerance: Patient tolerated treatment well Patient left: with call bell/phone within reach;in bed;with bed alarm set Nurse Communication: Mobility status PT Visit Diagnosis: Difficulty in walking, not elsewhere classified (R26.2);Other abnormalities of gait and mobility (R26.89)    Time: 1610-9604 PT Time Calculation (min) (ACUTE ONLY): 25 min   Charges:   PT Evaluation $PT Eval Low Complexity: 1 Low PT Treatments $Therapeutic Activity: 8-22 mins        Delice Bison, PT  Acute Rehab Dept [facility]) [phone] Pager [phone]  03/16/2020   [facility] 03/16/2020, 12:26 PM

## 2020-03-16 NOTE — Progress Notes (Signed)
Pharmacy Antibiotic Note  Robert Kramer is a 44 y.o. 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 Pharmacy has been consulted for Vancomycin and cefepime dosing. 03/16/2020  D#5 full abx. MRSA & Serratia bacteremia Tricuspid valve endocarditis per TEE; TCTS consult pending L shoulder septic arthritis s/p extensive I&D 9/10- cx w/ rare SA WBC 12.5 after I&D yesterday SCr remain WNL at 0.54 AF Vanc trough tx on 9/8   Plan: Continue vancomycin 1000 mg Q 8 hours  Continue cefepime 2 gm q 8 hours for serratia  Monitor renal function Re-check vancomycin trough as appropriate  Height: 5\' 11"  (180.3 cm) Weight: 70.8 kg (156 lb) IBW/kg (Calculated) : 75.3  Temp (24hrs), Avg:98.3 F (36.8 C), Min:96.7 F (35.9 C), Max:100.2 F (37.9 C)  Recent Labs  Lab 03/11/20 1536 03/11/20 1830 03/11/20 2155 03/12/20 0400 03/13/20 0809 03/14/20 0503 03/16/20 0611 03/16/20 1244  WBC 13.7*  --   --  12.6*  --  15.6* 9.6 12.5*  CREATININE 0.81  --   --  0.52*  --  0.72 0.99 0.54*  LATICACIDVEN 2.0* 2.5* 2.0* 1.2  --   --   --   --   VANCOTROUGH  --   --   --   --  16  --   --   --     Estimated Creatinine Clearance: 115.5 mL/min (A) (by C-G formula based on SCr of 0.54 mg/dL (L)).    Allergies  Allergen Reactions  . Tramadol Other (See Comments)    Upset stomach  Antimicrobials this admission: 9/6 Vancomycin >>  9/6 Rocephin>>9/7 9/7 Cefepime>> Dose adjustments this admission: 9/8 0730 VT: 16  on vanc 1g IV q8h Microbiology results: 9/1 BCx x1: MRSA 9/1 BCx x1: Serratia Marcescens (S=Rocephin, Bactrim, Cipro; R=Cefazolin) 9/6 UCx: mult sp F 9/6 BCx: MRSA F 9/6 COVID: neg 9/8 Hep A reactive 9/8 Hep B NR 9/8 Hep C reactive 9/8 BCx2 ngtd 9/10 MRSA PCR neg MSSA PCR neg 9/10 L shoulder synovial fluid: rare SA  Thank you for allowing pharmacy to be a part of this patient's care.  11/8, Pharm.D 03/16/2020 2:26  PM

## 2020-03-16 NOTE — Progress Notes (Signed)
Patient states he is only having left shoulder and lower back pain. He stated that he is not having any issues at all with his Right shoulder. Patient only wanted the MRI L spine not the shoulder. MRI right shoulder cx'd

## 2020-03-16 NOTE — Plan of Care (Signed)
Patient is progressing as expected. 

## 2020-03-16 NOTE — Evaluation (Signed)
Occupational Therapy Evaluation Patient Details Name: Robert Kramer MRN: [id number] DOB: Jul 03, 1976 Today's Date: 03/16/2020    History of Present Illness 44 y.o. 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   Clinical Impression   Mr. Purcell Jungbluth is a 44 year old man with the above medical history who presents without functional use of left upper extremity secondary to pain, decreased ROM and strength, generalized weakness, decreased balance and decreased activity tolerance. Patient min assist for transfers, set up for grooming tasks and feeding, mod assist for dressing, min assist for UB bathing and mod assist for toileting.Patient will benefit from skilled OT services while in hospital to improve deficits in order to get patient to modified independence.Unsure of discharge plan at this time. May need further OT for LUE. Patient reports wanting to go to rehab.     Follow Up Recommendations   (TBD)    Equipment Recommendations       Recommendations for Other Services       Precautions / Restrictions Precautions Precautions: Fall Restrictions Weight Bearing Restrictions: No Other Position/Activity Restrictions: LUE WBAT, AROM as tolerated, able to lift arm      Mobility Bed Mobility Overal bed mobility: Needs Assistance Bed Mobility: Supine to Sit     Supine to sit: Min assist     General bed mobility comments: Hand hold needed to  pull himself up into sitting. Increased time secondary to pain.  Transfers Overall transfer level: Needs assistance Equipment used: 1 person hand held assist Transfers: Sit to/from Stand Sit to Stand: Min assist         General transfer comment: Min assist with hand hand to stand from bed height. Hand to take steps to recliner.    Balance Overall balance assessment: Needs assistance Sitting-balance support: No upper extremity supported;Feet supported Sitting balance-Leahy Scale: Good     Standing balance support: Single extremity supported;During functional activity Standing balance-Leahy Scale: Fair                             ADL either performed or assessed with clinical judgement   ADL Overall ADL's : Needs assistance/impaired Eating/Feeding: Set up   Grooming: Set up;Oral care;Wash/dry face;Wash/dry hands;Sitting   Upper Body Bathing: Set up;Sitting;Minimal assistance   Lower Body Bathing: Minimal assistance;Sit to/from stand;Set up;Sitting/lateral leans   Upper Body Dressing : Moderate assistance;Sitting;Set up   Lower Body Dressing: Moderate assistance;Sit to/from stand;Set up   Toilet Transfer: Minimal assistance;Stand-pivot;BSC   Toileting- Clothing Manipulation and Hygiene: Sit to/from stand;Moderate assistance;Set up       Functional mobility during ADLs: Minimal assistance       Vision   Vision Assessment?: No apparent visual deficits     Perception     Praxis      Pertinent Vitals/Pain Pain Assessment: 0-10 Pain Score: 4  Faces Pain Scale: Hurts even more Pain Location: L shoulder Pain Descriptors / Indicators: Sore;Grimacing;Guarding Pain Intervention(s): Monitored during session;Limited activity within patient's  tolerance     Hand Dominance Right   Extremity/Trunk Assessment Upper Extremity Assessment Upper Extremity Assessment: LUE deficits/detail;RUE deficits/detail RUE Deficits / Details: 4/5 shoulder strength, 5/5  elbow, 5/5 wrist, 4/5 grip RUE Sensation: WNL RUE Coordination:  (grossly functional) LUE Deficits / Details: Limited by pain; Tolerated Less than 30 flexion and extension, tolerated  Approx 30 degrees shoulder external rotation, 3+/5 elbow flexion (referred pain limiting strength testing, 5/5 wrist, 4/5 grip LUE Sensation: WNL LUE Coordination: WNL   Lower Extremity Assessment Lower Extremity Assessment: Defer to PT evaluation   Cervical / Trunk Assessment Cervical / Trunk Assessment: Normal   Communication Communication Communication: No difficulties   Cognition Arousal/Alertness: Awake/alert Behavior During Therapy: WFL for tasks assessed/performed Overall Cognitive Status: Within Functional Limits for tasks assessed                                     General Comments       Exercises     Shoulder Instructions      Home Living Family/patient expects to be discharged to:: Unsure Living Arrangements: Alone Available Help at Discharge: Family                             Additional Comments: pt states he stays with his sister or brother. Reports he wants to go to rehab when he discharges.      Prior Functioning/Environment Level of Independence: Independent                 OT Problem List: Decreased strength;Decreased range of motion;Decreased activity tolerance;Impaired balance (sitting and/or standing);Decreased knowledge of use of DME or AE;Impaired UE functional use;Pain      OT Treatment/Interventions: Self-care/ADL training;Therapeutic exercise;DME and/or AE instruction;Therapeutic activities;Patient/family education    OT Goals(Current goals can be found in the care plan section) Acute Rehab OT Goals Patient Stated Goal: To get stronger OT Goal Formulation: With patient Time For Goal Achievement: 03/30/20 Potential to Achieve Goals: Good  OT Frequency: Min 2X/week   Barriers to D/C:            Co-evaluation               AM-PAC OT "6 Clicks" Daily Activity     Outcome Measure Help from another person eating meals?: A Little Help from another person taking care of personal grooming?: A Little Help from another person toileting, which includes using toliet, bedpan, or urinal?: A Lot Help from another person bathing (including washing, rinsing, drying)?: A Lot Help from another person to put on and taking off regular upper body clothing?: A Lot Help from another person to put on and taking off regular lower body clothing?: A Lot 6 Click Score: 14   End of Session Nurse Communication: Mobility status  Activity Tolerance: Patient limited by pain Patient left: in chair;with call bell/phone within reach  OT Visit Diagnosis: Unsteadiness on feet (R26.81);Pain;Muscle weakness (generalized) (M62.81) Pain - Right/Left: Left Pain - part of body: Shoulder                Time: 9811-9147 OT Time Calculation (min): 31 min Charges:  OT General Charges $OT Visit: 1 Visit OT Evaluation $OT Eval Moderate Complexity: 1 Mod OT Treatments $Self Care/Home Management : 8-22 mins  Frederich Montilla, OTR/L Acute Care Rehab Services  Office [phone] Pager: [phone]   Kelli Churn 03/16/2020,  1:01 PM

## 2020-03-16 NOTE — Progress Notes (Signed)
[facility] Progress Note weekend coverage   Patient ID: Robert Kramer MRN: [id number] DOB/AGE: 03/14/1976 44 y.o.  Subjective:  No acute issues of note No complaints   Behavior is bizarre   Afebrile, mild tachycardia   Intra-op cultures growing S. aureus   No CP, No SOB Feels hot  Diaphoresis   ROS As above Objective:   VITALS:   Vitals:   03/16/20 1053 03/16/20 1121 03/16/20 1442 03/16/20 1752  BP: 94/67  101/67 99/66  Pulse: (!) 106  (!) 116 (!) 113  Resp: (!) 22 18 18 20   Temp: (!) 97.3 F (36.3 C)  99.5 F (37.5 C) 99.5 F (37.5 C)  TempSrc: Oral  Oral Oral  SpO2: 96%  92% 93%  Weight:      Height:        Estimated body mass index is 21.76 kg/m as calculated from the following:   Height as of this encounter: 5\' 11"  (1.803 m).   Weight as of this encounter: 70.8 kg.   Intake/Output      09/11 0701 - 09/12 0700   P.O.    I.V. (mL/kg)    IV Piggyback    Total Intake(mL/kg)    Urine (mL/kg/hr) 450 (0.5)   Blood    Total Output 450   Net -450         LABS  Results for orders placed or performed during the hospital encounter of 03/11/20 (from the past 24 hour(s))  Type and screen [facility]     Status: None   Collection Time: 03/15/20  8:50 PM  Result Value Ref Range   ABO/RH(D) O POS    Antibody Screen NEG    Sample Expiration      03/18/2020,2359 Performed at [facility], [address], [place], [place]   Basic metabolic panel     Status: Abnormal   Collection Time: 03/16/20  6:11 AM  Result Value Ref Range   Sodium 130 (L) 135 - 145 mmol/L   Potassium 5.4 (H) 3.5 - 5.1 mmol/L   Chloride 97 (L) 98 - 111 mmol/L   CO2 24 22 - 32 mmol/L   Glucose, Bld 134 (H) 70 - 99 mg/dL   BUN 21 (H) 6 - 20 mg/dL   Creatinine, Ser 29798 0.61 - 1.24 mg/dL   Calcium 8.1 (L) 8.9 - 10.3 mg/dL   GFR calc non Af Amer >60  >60 mL/min   GFR calc Af Amer >60 >60 mL/min   Anion gap 9 5 - 15  CBC with Differential/Platelet     Status: Abnormal   Collection Time: 03/16/20  6:11 AM  Result Value Ref Range   WBC 9.6 4.0 - 10.5 K/uL   RBC 2.57 (L) 4.22 - 5.81 MIL/uL   Hemoglobin 7.2 (L) 13.0 - 17.0 g/dL   HCT 9.21 (L) 39 - 52 %   MCV 84.0 80.0 - 100.0 fL   MCH 28.0 26.0 - 34.0 pg   MCHC 33.3 30.0 - 36.0 g/dL   RDW 05/16/20 19.4 - 17.4 %   Platelets 61 (L) 150 - 400 K/uL   nRBC 0.0 0.0 - 0.2 %   Neutrophils Relative % 79 %   Neutro Abs 7.5 1.7 - 7.7 K/uL   Lymphocytes Relative 14 %  Lymphs Abs 1.4 0.7 - 4.0 K/uL   Monocytes Relative 6 %   Monocytes Absolute 0.6 0 - 1 K/uL   Eosinophils Relative 0 %   Eosinophils Absolute 0.0 0 - 0 K/uL   Basophils Relative 0 %   Basophils Absolute 0.0 0 - 0 K/uL   Immature Granulocytes 1 %   Abs Immature Granulocytes 0.10 (H) 0.00 - 0.07 K/uL  CBC with Differential/Platelet     Status: Abnormal   Collection Time: 03/16/20 12:44 PM  Result Value Ref Range   WBC 12.5 (H) 4.0 - 10.5 K/uL   RBC 2.77 (L) 4.22 - 5.81 MIL/uL   Hemoglobin 7.7 (L) 13.0 - 17.0 g/dL   HCT 00.9 (L) 39 - 52 %   MCV 81.2 80.0 - 100.0 fL   MCH 27.8 26.0 - 34.0 pg   MCHC 34.2 30.0 - 36.0 g/dL   RDW 23.3 00.7 - 62.2 %   Platelets 78 (L) 150 - 400 K/uL   nRBC 0.0 0.0 - 0.2 %   Neutrophils Relative % 78 %   Neutro Abs 9.9 (H) 1.7 - 7.7 K/uL   Lymphocytes Relative 12 %   Lymphs Abs 1.5 0.7 - 4.0 K/uL   Monocytes Relative 7 %   Monocytes Absolute 0.9 0 - 1 K/uL   Eosinophils Relative 1 %   Eosinophils Absolute 0.1 0 - 0 K/uL   Basophils Relative 0 %   Basophils Absolute 0.0 0 - 0 K/uL   Immature Granulocytes 2 %   Abs Immature Granulocytes 0.19 (H) 0.00 - 0.07 K/uL  Basic metabolic panel     Status: Abnormal   Collection Time: 03/16/20 12:44 PM  Result Value Ref Range   Sodium 128 (L) 135 - 145 mmol/L   Potassium 5.1 3.5 - 5.1 mmol/L   Chloride 97 (L) 98 - 111 mmol/L   CO2 25 22 - 32 mmol/L     Glucose, Bld 100 (H) 70 - 99 mg/dL   BUN 16 6 - 20 mg/dL   Creatinine, Ser 6.33 (L) 0.61 - 1.24 mg/dL   Calcium 7.9 (L) 8.9 - 10.3 mg/dL   GFR calc non Af Amer >60 >60 mL/min   GFR calc Af Amer >60 >60 mL/min   Anion gap 6 5 - 15     PHYSICAL EXAM:   Gen: in bed, sweating profusely, gown saturated but does not appear to be in any distress. Behavior seemingly bizarre   Sling hanging on back of the door  Ext:       Left Upper Extremity   Dressing stable, some strike through but minimal  Ext warm   No real swelling   + radial pulse  Radial, ulnar, median, axillary nv motor and sensory functions intact  Good elbow, forearm, wrist and hand motion   Tolerates gentle shoulder motion   Assessment/Plan: 1 Day Post-Op   Principal Problem:   Severe sepsis due to methicillin resistant Staphylococcus aureus (MRSA) with acute organ dysfunction (HCC) Active Problems:   Polysubstance dependence (HCC)   Hypokalemia   Thrombocytopenia (HCC)   Serratia septicemia (HCC)   Bipolar 1 disorder (HCC)   Heroin abuse (HCC)   MRSA bacteremia   Anti-infectives (From admission, onward)   Start     Dose/Rate Route Frequency Ordered Stop   03/12/20 2000  cefTRIAXone (ROCEPHIN) 2 g in sodium chloride 0.9 % 100 mL IVPB  Status:  Discontinued        2 g 200 mL/hr over 30 Minutes Intravenous  Every 24 hours 03/11/20 2058 03/12/20 0937   03/12/20 1400  ceFEPIme (MAXIPIME) 2 g in sodium chloride 0.9 % 100 mL IVPB        2 g 200 mL/hr over 30 Minutes Intravenous Every 8 hours 03/12/20 0937     03/12/20 0000  vancomycin (VANCOCIN) IVPB 1000 mg/200 mL premix        1,000 mg 200 mL/hr over 60 Minutes Intravenous Every 8 hours 03/11/20 1816     03/11/20 1830  cefTRIAXone (ROCEPHIN) 2 g in sodium chloride 0.9 % 100 mL IVPB        2 g 200 mL/hr over 30 Minutes Intravenous  Once 03/11/20 1822 03/11/20 2030   03/11/20 1545  vancomycin (VANCOREADY) IVPB 1500 mg/300 mL        1,500 mg 150 mL/hr over 120  Minutes Intravenous  Once 03/11/20 1532 03/11/20 1819    .  POD/HD#: 1  44 y/o male, IVDU, bacteremia, septic emboli, septic arthritis L shoulder  -septic arthritis L shoulder s/p I&D  No plans to return to OR for further washouts  ROM as tolerated  WBAT L UEx  Dressing changes as needed starting tomorrow   Ice PRN   - Pain management:  Per primary    - Dispo:  Continue per medicine and ID     Mearl Latin, PA-C [phone] (C) 03/16/2020, 8:36 PM  [facility] [address] [place] [address] [phone] [facility] (F)

## 2020-03-17 LAB — BASIC METABOLIC PANEL
Anion gap: 7 (ref 5–15)
BUN: 16 mg/dL (ref 6–20)
CO2: 26 mmol/L (ref 22–32)
Calcium: 8 mg/dL — ABNORMAL LOW (ref 8.9–10.3)
Chloride: 96 mmol/L — ABNORMAL LOW (ref 98–111)
Creatinine, Ser: 0.59 mg/dL — ABNORMAL LOW (ref 0.61–1.24)
GFR calc Af Amer: >60 mL/min (ref >60)
GFR calc non Af Amer: >60 mL/min (ref >60)
Glucose, Bld: 125 mg/dL — ABNORMAL HIGH (ref 70–99)
Potassium: 5.5 mmol/L — ABNORMAL HIGH (ref 3.5–5.1)
Sodium: 129 mmol/L — ABNORMAL LOW (ref 135–145)

## 2020-03-17 LAB — RAPID URINE DRUG SCREEN, HOSP PERFORMED
Amphetamines: NOT DETECTED
Barbiturates: NOT DETECTED
Benzodiazepines: NOT DETECTED
Cocaine: NOT DETECTED
Opiates: NOT DETECTED
Tetrahydrocannabinol: NOT DETECTED

## 2020-03-17 LAB — COMPREHENSIVE METABOLIC PANEL
ALT: 27 U/L (ref 0–44)
AST: 32 U/L (ref 15–41)
Albumin: 1.3 g/dL — ABNORMAL LOW (ref 3.5–5.0)
Alkaline Phosphatase: 50 U/L (ref 38–126)
Anion gap: 6 (ref 5–15)
BUN: 14 mg/dL (ref 6–20)
CO2: 26 mmol/L (ref 22–32)
Calcium: 7.9 mg/dL — ABNORMAL LOW (ref 8.9–10.3)
Chloride: 94 mmol/L — ABNORMAL LOW (ref 98–111)
Creatinine, Ser: 0.54 mg/dL — ABNORMAL LOW (ref 0.61–1.24)
GFR calc Af Amer: >60 mL/min (ref >60)
GFR calc non Af Amer: >60 mL/min (ref >60)
Glucose, Bld: 106 mg/dL — ABNORMAL HIGH (ref 70–99)
Potassium: 4.1 mmol/L (ref 3.5–5.1)
Sodium: 126 mmol/L — ABNORMAL LOW (ref 135–145)
Total Bilirubin: 0.9 mg/dL (ref 0.3–1.2)
Total Protein: 6.3 g/dL — ABNORMAL LOW (ref 6.5–8.1)

## 2020-03-17 LAB — CBC WITH DIFFERENTIAL/PLATELET
Abs Immature Granulocytes: 0.1 10*3/uL — ABNORMAL HIGH (ref 0.00–0.07)
Basophils Absolute: 0 10*3/uL (ref 0.0–0.1)
Basophils Relative: 0 %
Eosinophils Absolute: 0 10*3/uL (ref 0.0–0.5)
Eosinophils Relative: 0 %
HCT: 25.4 % — ABNORMAL LOW (ref 39.0–52.0)
Hemoglobin: 8 g/dL — ABNORMAL LOW (ref 13.0–17.0)
Immature Granulocytes: 1 %
Lymphocytes Relative: 10 %
Lymphs Abs: 1.2 10*3/uL (ref 0.7–4.0)
MCH: 28 pg (ref 26.0–34.0)
MCHC: 31.5 g/dL (ref 30.0–36.0)
MCV: 88.8 fL (ref 80.0–100.0)
Monocytes Absolute: 0.7 10*3/uL (ref 0.1–1.0)
Monocytes Relative: 6 %
Neutro Abs: 9.8 10*3/uL — ABNORMAL HIGH (ref 1.7–7.7)
Neutrophils Relative %: 83 %
Platelets: 98 10*3/uL — ABNORMAL LOW (ref 150–400)
RBC: 2.86 MIL/uL — ABNORMAL LOW (ref 4.22–5.81)
RDW: 14.4 % (ref 11.5–15.5)
WBC: 11.8 10*3/uL — ABNORMAL HIGH (ref 4.0–10.5)
nRBC: 0 % (ref 0.0–0.2)

## 2020-03-17 LAB — MAGNESIUM: Magnesium: 1.8 mg/dL (ref 1.7–2.4)

## 2020-03-17 MED ORDER — ENSURE ENLIVE PO LIQD
237.0000 mL | Freq: Three times a day (TID) | ORAL | Status: DC
  Administered 2020-03-18 – 2020-04-22 (×96): 237 mL via ORAL
  Filled 2020-03-17 (×6): qty 237

## 2020-03-17 MED ORDER — SODIUM ZIRCONIUM CYCLOSILICATE 10 G PO PACK
10.0000 g | PACK | Freq: Once | ORAL | Status: AC
  Administered 2020-03-17: 10 g via ORAL
  Filled 2020-03-17: qty 1

## 2020-03-17 NOTE — Progress Notes (Addendum)
[facility] Progress Note  Patient: Robert Kramer    WCH:[id number]  DOA: 03/11/2020     Date of Service: the patient was seen and examined on 03/17/2020  Brief hospital course: Past medical history of polysubstance abuse and bipolar disorder.  Presents with symptoms of withdrawal found to MRSA and Serratia bacteremia with infected endocarditis. Currently plan is continue antibiotics.  Assessment and Plan: 1.  Severe sepsis POA. MRSA and Serratia bacteremia. Pulmonary septic emboli. Infected endocarditis involving tricuspid valve. Left shoulder septic arthritis. On presentation tachypnea tachycardia with endorgan damage and thrombocytopenia. Blood cultures positive. Repeat cultures so far negative. ID consulted. Underwent left shoulder arthroscopy with debridement irrigation, for septic arthritis with orthopedics. Echocardiogram shows tricuspid vegetation. TEE shows size 2 cm with severe TR. Cardiothoracic surgery consulted and patient will be transferred to [facility] for further evaluation. I have updated CT surgery who will see the pt at [facility].  MRI lumbar spine performed shows no evidence of epidural abscess. Patient refused MRI right shoulder ordered by ID.  2.  Heroin use disorder. Started on Suboxone. Continue supportive care. Robaxin. Low-dose Toradol.  3.  Thrombocytopenia Secondary to severe sepsis. Improving  Monitor while the patient is having Toradol.  4.  Bipolar disorder. Depression. Anxiety. Can add gabapentin.  Monitor.  5.  Anemia likely nutritional deficiency No acute bleeding. Monitor potential transfer hemoglobin less than 7.  6.  Hyponatremia From poor p.o. intake. Possibility of SIADH. Ensure and fluid restriction.  Monitor.  7. Hyperkalemia  Mild monitor.   Diet: Regular diet DVT Prophylaxis:   SCDs Start: 03/11/20 2055    Advance goals of care discussion: Full code  Family Communication: no family was present at  bedside, at the time of interview.   Disposition:  Status is: Inpatient  Remains inpatient appropriate because:Inpatient level of care appropriate due to severity of illness   Dispo: The patient is from: Home              Anticipated d/c is to: Home              Anticipated d/c date is: > 3 days              Patient currently is not medically stable to d/c.  Subjective: pain well controlled no acute complains.   Physical Exam: General: Appear in mild distress, no Rash; Oral Mucosa Clear, moist. no Abnormal Neck Mass Or lumps, Conjunctiva normal  Cardiovascular: S1 and S2 Present, aortic systolic  Murmur, Respiratory: good respiratory effort, Bilateral Air entry present and CTA, no Crackles, no wheezes Abdomen: Bowel Sound present, Soft and no tenderness Extremities: no Pedal edema left shoulder wrapped.  Neurology: alert and oriented to time, place, and person affect appropriate. no new focal deficit Gait not checked due to patient safety concerns  Vitals:   03/16/20 1442 03/16/20 1752 03/16/20 2056 03/17/20 0444  BP: 101/67 99/66 96/60  103/69  Pulse: (!) 116 (!) 113 (!) 102 (!) 101  Resp: 18 20 18 14   Temp: 99.5 F (37.5 C) 99.5 F (37.5 C) 97.6 F (36.4 C) 98.3 F (36.8 C)  TempSrc: Oral Oral Oral Oral  SpO2: 92% 93% 95% 98%  Weight:      Height:        Intake/Output Summary (Last 24 hours) at 03/17/2020 1222 Last data filed at 03/17/2020 05/17/2020 Gross per 24 hour  Intake 680 ml  Output 850 ml  Net -170 ml   Filed Weights   03/11/20 1521  Weight: 70.8 kg    Data Reviewed: I have personally reviewed and interpreted daily labs, tele strips, imagings as discussed above. I reviewed all nursing notes, pharmacy notes, vitals, pertinent old records I have discussed plan of care as described above with RN and patient/family.  CBC: Recent Labs  Lab 03/11/20 1536 03/11/20 1536 03/12/20 0400 03/14/20 0503 03/16/20 0611 03/16/20 1244 03/17/20 0639  WBC 13.7*   < >  12.6* 15.6* 9.6 12.5* 11.8*  NEUTROABS 12.5*  --   --  12.2* 7.5 9.9* 9.8*  HGB 10.7*   < > 9.6* 8.1* 7.2* 7.7* 8.0*  HCT 31.8*   < > 30.0* 24.5* 21.6* 22.5* 25.4*  MCV 83.7   < > 87.7 85.7 84.0 81.2 88.8  PLT 32*   < > 26* 47* 61* 78* 98*   < > = values in this interval not displayed.   Basic Metabolic Panel: Recent Labs  Lab 03/11/20 2033 03/12/20 0400 03/14/20 0503 03/16/20 0611 03/16/20 1244 03/17/20 0639 03/17/20 1058  NA  --    < > 127* 130* 128* 126* 129*  K  --    < > 4.1 5.4* 5.1 4.1 5.5*  CL  --    < > 99 97* 97* 94* 96*  CO2  --    < > 24 24 25 26 26   GLUCOSE  --    < > 104* 134* 100* 106* 125*  BUN  --    < > 19 21* 16 14 16   CREATININE  --    < > 0.72 0.99 0.54* 0.54* 0.59*  CALCIUM  --    < > 7.5* 8.1* 7.9* 7.9* 8.0*  MG 2.0  --   --   --   --  1.8  --    < > = values in this interval not displayed.    Studies: MR Lumbar Spine W Wo Contrast  Result Date: 03/16/2020 CLINICAL DATA:  Severe sepsis due to MR SA.  Epidural abscess. EXAM: MRI LUMBAR SPINE WITHOUT AND WITH CONTRAST TECHNIQUE: Multiplanar and multiecho pulse sequences of the lumbar spine were obtained without and with intravenous contrast. CONTRAST:  8mL GADAVIST GADOBUTROL 1 MMOL/ML IV SOLN COMPARISON:  None. FINDINGS: Segmentation: 5 non rib-bearing lumbar type vertebral bodies are present. The lowest fully formed vertebral body is L5. Alignment:  No significant listhesis is present. Vertebrae: Edematous endplate marrow changes are present on the right at L4-5 with subtle associated enhancement. Although there is T2 signal in the disc space at L4-5, no enhancement is associated. Enhancement is noted in the paraspinous soft tissues on the right at L4-5 without a discrete abscess. No enhancement is present within the musculature. Conus medullaris and cauda equina: Conus extends to the L1 level. Conus and cauda equina appear normal. Paraspinal and other soft tissues: Asymmetric T2 signal and enhancement is  present in the right paraspinous space at L4-5. No signal changes or enhancement are present in the adjacent muscle. Disc levels: L1-2: Negative. L2-3: Negative. L3-4: Disc desiccation and diffuse bulging is present without significant focal disc protrusion or stenosis. L4-5: A rightward disc protrusion is present. The central canal is patent. Disc extends into the right foramen with mild right foraminal stenosis. L5-S1: Negative. IMPRESSION: 1. Edematous endplate marrow changes and enhancement on the right at L4-5 likely represents degenerative change. No definite infection is present. 2. Rightward disc protrusion at L4-5 with mild right foraminal stenosis. 3. Enhancement in the right paraspinous soft tissues at L4-5 without a discrete  abscess. This may be reactive. No definite infection is present. 4. Disc desiccation and bulging at L3-4 without significant stenosis. Electronically Signed   By: Marin Roberts M.D.   On: 03/16/2020 14:34    Scheduled Meds: . buprenorphine-naloxone  1 tablet Sublingual BID  . feeding supplement (ENSURE ENLIVE)  237 mL Oral TID BM  . multivitamin with minerals  1 tablet Oral Daily  . sodium chloride flush  3 mL Intravenous Q12H   Continuous Infusions: . ceFEPime (MAXIPIME) IV 2 g (03/17/20 0446)  . vancomycin 1,000 mg (03/17/20 0917)   PRN Meds: acetaminophen **OR** acetaminophen, hydrOXYzine, ketorolac, loperamide, methocarbamol, ondansetron **OR** ondansetron (ZOFRAN) IV  Time spent: 35 minutes  Author: Lynden Oxford, MD [facility] 03/17/2020 12:22 PM  To reach On-call, see care teams to locate the attending and reach out via [email or web address]. Between 7PM-7AM, please contact night-coverage If you still have difficulty reaching the attending provider, please page the [facility] (Director on Call) for [facility] on amion for assistance.

## 2020-03-17 NOTE — Progress Notes (Signed)
Report called to Yoko E at [facility].

## 2020-03-17 NOTE — Progress Notes (Signed)
     [address]       [name] [address]             [phone]       Consult received and TEE reviewed. The patient does have a mobile tricuspid valve vegetations on the atrial side.  This would be amenable to catheter-based debridement with Angiovac system. We will evaluate for procedure upon transfer to [facility].  Masoud Nyce Keane Scrape

## 2020-03-17 NOTE — Anesthesia Postprocedure Evaluation (Signed)
Anesthesia Post Note  Patient: Robert Kramer  Procedure(s) Performed: ARTHROSCOPY SHOULDER, IRRIGATION AND DEBRIDEMENT OF SHOULDER BURSECTOMY (Left Shoulder) TRANSESOPHAGEAL ECHOCARDIOGRAM (TEE) (N/A )     Patient location during evaluation: PACU Anesthesia Type: General Level of consciousness: awake and alert, oriented and patient cooperative Pain management: pain level controlled Vital Signs Assessment: post-procedure vital signs reviewed and stable Respiratory status: spontaneous breathing, nonlabored ventilation and respiratory function stable Cardiovascular status: blood pressure returned to baseline and stable Postop Assessment: no apparent nausea or vomiting Anesthetic complications: no   No complications documented.  Last Vitals:  Vitals:   03/16/20 2056 03/17/20 0444  BP: 96/60 103/69  Pulse: (!) 102 (!) 101  Resp: 18 14  Temp: 36.4 C 36.8 C  SpO2: 95% 98%    Last Pain:  Vitals:   03/17/20 0444  TempSrc: Oral  PainSc:                  Lannie Fields

## 2020-03-17 NOTE — Progress Notes (Signed)
dressing change to left shoulder. Steri strips x2. Would appromated no drainage or redness noted. Pt tolerated well

## 2020-03-18 ENCOUNTER — Encounter (HOSPITAL_COMMUNITY): Payer: Self-pay | Admitting: Orthopedic Surgery

## 2020-03-18 DIAGNOSIS — D649 Anemia, unspecified: Secondary | ICD-10-CM | POA: Diagnosis present

## 2020-03-18 DIAGNOSIS — I339 Acute and subacute endocarditis, unspecified: Secondary | ICD-10-CM

## 2020-03-18 DIAGNOSIS — I2690 Septic pulmonary embolism without acute cor pulmonale: Secondary | ICD-10-CM | POA: Diagnosis present

## 2020-03-18 DIAGNOSIS — I360 Nonrheumatic tricuspid (valve) stenosis: Secondary | ICD-10-CM

## 2020-03-18 DIAGNOSIS — I079 Rheumatic tricuspid valve disease, unspecified: Secondary | ICD-10-CM | POA: Diagnosis present

## 2020-03-18 DIAGNOSIS — Z8679 Personal history of other diseases of the circulatory system: Secondary | ICD-10-CM | POA: Diagnosis present

## 2020-03-18 DIAGNOSIS — F17210 Nicotine dependence, cigarettes, uncomplicated: Secondary | ICD-10-CM | POA: Diagnosis present

## 2020-03-18 DIAGNOSIS — B1920 Unspecified viral hepatitis C without hepatic coma: Secondary | ICD-10-CM | POA: Diagnosis present

## 2020-03-18 DIAGNOSIS — M009 Pyogenic arthritis, unspecified: Secondary | ICD-10-CM | POA: Diagnosis present

## 2020-03-18 DIAGNOSIS — F1990 Other psychoactive substance use, unspecified, uncomplicated: Secondary | ICD-10-CM | POA: Diagnosis present

## 2020-03-18 LAB — BASIC METABOLIC PANEL
Anion gap: 8 (ref 5–15)
BUN: 11 mg/dL (ref 6–20)
CO2: 25 mmol/L (ref 22–32)
Calcium: 7.9 mg/dL — ABNORMAL LOW (ref 8.9–10.3)
Chloride: 97 mmol/L — ABNORMAL LOW (ref 98–111)
Creatinine, Ser: 0.66 mg/dL (ref 0.61–1.24)
GFR calc Af Amer: >60 mL/min (ref >60)
GFR calc non Af Amer: >60 mL/min (ref >60)
Glucose, Bld: 124 mg/dL — ABNORMAL HIGH (ref 70–99)
Potassium: 4.1 mmol/L (ref 3.5–5.1)
Sodium: 130 mmol/L — ABNORMAL LOW (ref 135–145)

## 2020-03-18 LAB — CBC WITH DIFFERENTIAL/PLATELET
Abs Immature Granulocytes: 0.11 10*3/uL — ABNORMAL HIGH (ref 0.00–0.07)
Basophils Absolute: 0 10*3/uL (ref 0.0–0.1)
Basophils Relative: 0 %
Eosinophils Absolute: 0 10*3/uL (ref 0.0–0.5)
Eosinophils Relative: 0 %
HCT: 23.5 % — ABNORMAL LOW (ref 39.0–52.0)
Hemoglobin: 7.3 g/dL — ABNORMAL LOW (ref 13.0–17.0)
Immature Granulocytes: 1 %
Lymphocytes Relative: 14 %
Lymphs Abs: 1.7 10*3/uL (ref 0.7–4.0)
MCH: 26.8 pg (ref 26.0–34.0)
MCHC: 31.1 g/dL (ref 30.0–36.0)
MCV: 86.4 fL (ref 80.0–100.0)
Monocytes Absolute: 0.8 10*3/uL (ref 0.1–1.0)
Monocytes Relative: 7 %
Neutro Abs: 9.1 10*3/uL — ABNORMAL HIGH (ref 1.7–7.7)
Neutrophils Relative %: 78 %
Platelets: 112 10*3/uL — ABNORMAL LOW (ref 150–400)
RBC: 2.72 MIL/uL — ABNORMAL LOW (ref 4.22–5.81)
RDW: 14.6 % (ref 11.5–15.5)
WBC: 11.8 10*3/uL — ABNORMAL HIGH (ref 4.0–10.5)
nRBC: 0 % (ref 0.0–0.2)

## 2020-03-18 LAB — MAGNESIUM: Magnesium: 1.8 mg/dL (ref 1.7–2.4)

## 2020-03-18 MED ORDER — SODIUM CHLORIDE 0.9 % IV SOLN
2.0000 g | INTRAVENOUS | Status: DC
  Administered 2020-03-18 – 2020-03-19 (×2): 2 g via INTRAVENOUS
  Filled 2020-03-18: qty 2
  Filled 2020-03-18: qty 20
  Filled 2020-03-18: qty 2

## 2020-03-18 NOTE — Plan of Care (Signed)
Problem: Pain Managment: Goal: General experience of comfort will improve Outcome: Progressing   Problem: Coping: Goal: Level of anxiety will decrease Outcome: Completed/Met   Problem: Elimination: Goal: Will not experience complications related to urinary retention Outcome: Completed/Met

## 2020-03-18 NOTE — Progress Notes (Signed)
[facility]  Date of Admission:  03/11/2020   Total days of antibiotics 7       Vancomycin Day 7       Cefepime     Day 7         ASSESSMENT: Davis GourdJames Savoia is a 44 year old male with history of polysubstance abuse, IV drug use and bipolar disorder, who is admitted for MRSA and Serratia bacteremia, complicated by septic emboli to the lungs, tricuspid valve endocarditis and left shoulder septic arthritis.  PLAN: 1. Severe sepsis due to MRSA and Serratia bacteremia Blood culture 03/06/20 was positive for MRSA and Serratia.   Repeat blood culture 03/11/20 positive for staph aureus.   Repeat blood culture 03/13/2020 shows no growth.  Patient is on day 7 of vancomycin and cefepime.  Will narrow down antibiotic regimen to vancomycin and ceftriaxone.  Duration of antibiotics is 6 weeks total.  Will discuss with patient regarding his discharge plan after he is stable whether he will be in the facility where they can administer the IV antibiotics or transition to oral antibiotics.  1 IV  Plan: -Continue vancomycin  -Switch to ceftriaxone 2 g -Continue to trend fever curve and WBC   2. 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 There is evidence of possible pulmonic valve vegetation as well.  Cardiothoracic surgery was consulted and will proceed with catheter-based debridement with angiovac system.  Patient will ultimately require a tricuspid valve replacement.  Plan: -Continue the antibiotics with vancomycin and ceftriaxone -Proceed with catheter-based debridement with angiovac system.  3. Left shoulder septic arthritis MRI of left shoulder on 03/14/2020 shows joint effusion consistent with septic  emboli with osteomyelitis or intramuscular abscess.  Patient underwent debridement, irrigation and subacromic bursectomy 03/15/20.  MRI of right shoulder was not performed but we will hold it at this time given lack of pain or evidence of infection of right shoulder.  MRI of lumbar did not show any definitive evidence of infection but will need close monitor given ongoing pain and early evidence of infection on MRI.  Plan: -Continue vancomycin and ceftriaxone -Wound care  4. Septic emboli to the lungs CTA chest 03/11/2020 shows multiple cavitary and non cavitary nodules of varying sizes throughout both lungs, compatible with septic emboli.  Patient does not complain of respiratory symptoms currently.  Continue treating the infection with antibiotics and vegetation debridement with angio vac.  Plan: -Continue vancomycin and ceftriaxone -Vegetation debridement with angio vac  5. Hepatitis C infection Pending HCV RNA quant.  Will discuss treatment outpatient.  6. IV drug use I explained to patient about the consequences of his IV heroin use.  Patient voiced understand and expressed that he will quit the IV drug use.  Patient states that he has good support system and [place] [facility] [place] [facility] [place] [facility].  Will continue to work with transition of care to formulate a plan after discharge.  Principal Problem:   Severe sepsis due to methicillin resistant Staphylococcus aureus (MRSA) with acute organ dysfunction (HCC) Active Problems:   Serratia septicemia (HCC)   Septic arthritis of shoulder, left (HCC)   Endocarditis of tricuspid valve   Hepatitis C virus infection   Polysubstance dependence (HCC)  Hypokalemia   Thrombocytopenia (HCC)   Bipolar 1 disorder (HCC)   Heroin abuse (HCC)   IVDU (intravenous drug user)   Normocytic anemia   Cigarette smoker   Septic pulmonary embolism (HCC)   Scheduled Meds: . buprenorphine-naloxone  1 tablet Sublingual BID  . feeding  supplement (ENSURE ENLIVE)  237 mL Oral TID BM  . multivitamin with minerals  1 tablet Oral Daily  . sodium chloride flush  3 mL Intravenous Q12H   Continuous Infusions: . ceFEPime (MAXIPIME) IV 2 g (03/18/20 0603)  . vancomycin 1,000 mg (03/18/20 0859)   PRN Meds:.acetaminophen **OR** acetaminophen, hydrOXYzine, ketorolac, loperamide, methocarbamol, ondansetron **OR** ondansetron (ZOFRAN) IV   SUBJECTIVE: JAARON OLESON is a 44 y.o. male with past medical history of polysubstance abuse, IV drug user and bipolar, who was admitted to [facility] emergency department on 03/06/2020 for heroine withdrawal.  His blood culture at that time show MRSA and Serratia.  He was readmitted on 03/11/2020 for MRSA and Serratia bacteremia with septic emboli to the lungs and tricuspid valve endocarditis.  Was also found to have left shoulder septic arthritis status post debridement and irrigation on 03/15/2020.  Patient is transferred to [facility] on 02/15/2020 to have a catheter-based debridement with angiovac system.  Patient is seen at bedside this morning.  He is lying comfortable in bed with no acute distress.  Patient denies fever or chills and states that he is feeling better.  He complains of generalized pain, especially at the lumbar region.  He also complains of "stoned" sensation of bilateral feet.  He denies pain of right shoulder.   When talking about his IV drug use problem, patient expressed that he is determined to stop the heroin.  Patient has good support system including his brother and sister.  He states that he has a place in [facility] where he can receive help for his IV drug use issue.  Review of Systems: Review of Systems  Constitutional: Negative for chills and fever.  HENT: Negative for hearing loss.   Eyes: Negative.   Respiratory: Negative.   Cardiovascular: Positive for leg swelling.  Gastrointestinal: Positive for constipation. Negative for diarrhea.  Genitourinary: Negative for  dysuria.  Musculoskeletal: Positive for back pain, joint pain and myalgias.    Allergies  Allergen Reactions  . Tramadol Other (See Comments)    Upset stomach    OBJECTIVE: Vitals:   03/18/20 0033 03/18/20 0500 03/18/20 0528 03/18/20 0537  BP: 107/66 99/61  106/71  Pulse: (!) 108 (!) 103    Resp: 18 18 18    Temp: 99.3 F (37.4 C) 98.2 F (36.8 C)    TempSrc: Oral Oral    SpO2: 98% 98% 98%   Weight:  64 kg    Height:       Body mass index is 19.68 kg/m.  Physical Exam Constitutional:      General: He is not in acute distress. HENT:     Head: Normocephalic.  Eyes:     General: No scleral icterus.       Right eye: No discharge.        Left eye: No discharge.     Conjunctiva/sclera: Conjunctivae normal.  Cardiovascular:     Rate and Rhythm: Regular rhythm. Tachycardia present.  Pulmonary:     Effort: No respiratory distress.     Breath sounds: Normal breath sounds.  Abdominal:     General: Bowel sounds are normal.  Musculoskeletal:     Cervical back: Normal range  of motion.     Right lower leg: Edema present.     Left lower leg: Edema present.     Comments: +1 edema bilateral ankles.  Bilateral is not warm to touch.    Right shoulder not swollen or tender to touch.  Normal range of motion of right shoulder  Skin:    Comments: Blister noted at the bilateral lateral malleolus  Neurological:     Mental Status: He is alert.     Lab Results Lab Results  Component Value Date   WBC 11.8 (H) 03/18/2020   HGB 7.3 (L) 03/18/2020   HCT 23.5 (L) 03/18/2020   MCV 86.4 03/18/2020   PLT 112 (L) 03/18/2020    Lab Results  Component Value Date   CREATININE 0.66 03/18/2020   BUN 11 03/18/2020   NA 130 (L) 03/18/2020   K 4.1 03/18/2020   CL 97 (L) 03/18/2020   CO2 25 03/18/2020    Lab Results  Component Value Date   ALT 27 03/17/2020   AST 32 03/17/2020   ALKPHOS 50 03/17/2020   BILITOT 0.9 03/17/2020     Microbiology: Recent Results (from the past 240  hour(s))  Urine culture     Status: Abnormal   Collection Time: 03/11/20  3:28 PM   Specimen: In/Out Cath Urine  Result Value Ref Range Status   Specimen Description   Final    IN/OUT CATH URINE Performed at [facility], [address] [place], [place] [address]    Special Requests   Final    NONE Performed at [facility], [address], [place], [place] [address]    Culture MULTIPLE SPECIES PRESENT, SUGGEST RECOLLECTION (A)  Final   Report Status 03/13/2020 FINAL  Final  Blood culture (routine x 2)     Status: Abnormal   Collection Time: 03/11/20  3:31 PM   Specimen: BLOOD  Result Value Ref Range Status   Specimen Description   Final    BLOOD LEFT ARM Performed at [facility], [address] [place], [place] [address]    Special Requests   Final    BOTTLES DRAWN AEROBIC AND ANAEROBIC Blood Culture results may not be optimal due to an excessive volume of blood received in culture bottles Performed at [facility], [address], [place], [place] [address]    Culture  Setup Time   Final    GRAM POSITIVE COCCI IN CLUSTERS IN BOTH AEROBIC AND ANAEROBIC BOTTLES CRITICAL VALUE NOTED.  VALUE IS CONSISTENT WITH PREVIOUSLY REPORTED AND CALLED VALUE.    Culture (A)  Final    STAPHYLOCOCCUS AUREUS SUSCEPTIBILITIES PERFORMED ON PREVIOUS CULTURE WITHIN THE LAST 5 DAYS. Performed at [facility], [address], [place], [place] [address]    Report Status 03/14/2020 FINAL  Final  SARS Coronavirus 2 by RT PCR (hospital order, performed in [facility] hospital lab) Nasopharyngeal Nasopharyngeal Swab     Status: None   Collection Time: 03/11/20  5:05 PM   Specimen: Nasopharyngeal Swab  Result Value Ref Range Status   SARS Coronavirus 2 NEGATIVE NEGATIVE Final    Comment: (NOTE) SARS-CoV-2 target nucleic acids are NOT DETECTED.  The SARS-CoV-2 RNA is generally detectable in upper and  lower respiratory specimens during the acute phase of infection. The lowest concentration of SARS-CoV-2 viral copies this assay can detect is 250 copies / mL. A negative result does not preclude SARS-CoV-2 infection and should not be used as  the sole basis for treatment or other patient management decisions.  A negative result may occur with improper specimen collection / handling, submission of specimen other than nasopharyngeal swab, presence of viral mutation(s) within the areas targeted by this assay, and inadequate number of viral copies (<250 copies / mL). A negative result must be combined with clinical observations, patient history, and epidemiological information.  Fact Sheet for Patients:   BoilerBrush.com.cy  Fact Sheet for Healthcare Providers: https://pope.com/  This test is not yet approved or  cleared by the [place] FDA and has been authorized for detection and/or diagnosis of SARS-CoV-2 by FDA under an Emergency Use Authorization (EUA).  This EUA will remain in effect (meaning this test can be used) for the duration of the COVID-19 declaration under Section 564(b)(1) of the Act, 21 U.S.C. section 360bbb-3(b)(1), unless the authorization is terminated or revoked sooner.  Performed at [facility], [address], [place], [place] [address]   Culture, blood (Routine X 2) w Reflex to ID Panel     Status: None (Preliminary result)   Collection Time: 03/13/20 11:09 AM   Specimen: BLOOD  Result Value Ref Range Status   Specimen Description   Final    BLOOD RIGHT ANTECUBITAL Performed at [facility], [address], [place], [place] [address]    Special Requests   Final    BOTTLES DRAWN AEROBIC AND ANAEROBIC Blood Culture adequate volume Performed at [facility], [address] [place], [place] [address]    Culture   Final    NO GROWTH 4 DAYS Performed  at [facility], [address], [place], [place] [address]    Report Status PENDING  Incomplete  Culture, blood (Routine X 2) w Reflex to ID Panel     Status: None (Preliminary result)   Collection Time: 03/13/20 11:10 AM   Specimen: BLOOD RIGHT HAND  Result Value Ref Range Status   Specimen Description   Final    BLOOD RIGHT HAND Performed at [facility], [address], [place], [place] [address]    Special Requests   Final    BOTTLES DRAWN AEROBIC ONLY Blood Culture results may not be optimal due to an inadequate volume of blood received in culture bottles Performed at [facility], [address] [place], [place] [address]    Culture   Final    NO GROWTH 4 DAYS Performed at [facility], [address], [place], [place] [address]    Report Status PENDING  Incomplete  Surgical pcr screen     Status: None   Collection Time: 03/15/20 11:56 AM   Specimen: Nasal Mucosa; Nasal Swab  Result Value Ref Range Status   MRSA, PCR NEGATIVE NEGATIVE Final   Staphylococcus aureus NEGATIVE NEGATIVE Final    Comment: (NOTE) The Xpert SA Assay (FDA approved for NASAL specimens in patients 55 years of age and older), is one component of a comprehensive surveillance program. It is not intended to diagnose infection nor to guide or monitor treatment. Performed at [facility], [address] [place], [place] [address]   Aerobic/Anaerobic Culture (surgical/deep wound)     Status: None (Preliminary result)   Collection Time: 03/15/20  2:07 PM   Specimen: Synovial, Left Shoulder; Body Fluid  Result Value Ref Range Status   Specimen Description FLUID SYNOVIAL LEFT SHOULDER  Final   Special Requests NONE  Final   Gram Stain  Final    ABUNDANT WBC PRESENT, PREDOMINANTLY PMN RARE GRAM POSITIVE COCCI Performed at [facility], [address], [place], [place] [address]    Culture   Final    RARE STAPHYLOCOCCUS  AUREUS SUSCEPTIBILITIES TO FOLLOW NO ANAEROBES ISOLATED; CULTURE IN PROGRESS FOR 5 DAYS    Report Status PENDING  Incomplete    Doran Stabler, [facility] [phone] pager    03/18/2020, 10:29 AM

## 2020-03-18 NOTE — Progress Notes (Signed)
[address]       [place] [address]             [phone]        Robert Kramer [facility] Medical Record #[id number] Date of Birth: 1976-05-26  Referring: No ref. provider found Primary Care: Patient, No Pcp Per Primary Cardiologist:No primary care provider on file.  Chief Complaint:   No chief complaint on file.   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  He was found to have a septic joint and underwent an I&D with orthopedic surgery of his left shoulder.  He continues to be dyspneic, and febrile.  Cross-sectional imaging shows septic emboli to the lung.  Most recent blood cultures have been negative.     Past Medical History:  Diagnosis Date  . Anxiety   . Bipolar 1 disorder (HCC)   . Bipolar 1 disorder (HCC)   . DDD (degenerative disc disease), lumbar   . Depression   . Drug-seeking behavior   . Hallux valgus with bunions   . Mood swings   . Polysubstance abuse [facility])     Past Surgical History:  Procedure Laterality Date  . fatty tumor removed from left foot    . SHOULDER ARTHROSCOPY Left 03/15/2020   Procedure: ARTHROSCOPY SHOULDER, IRRIGATION AND DEBRIDEMENT OF SHOULDER BURSECTOMY;  Surgeon: Teryl Lucy, MD;  Location: [facility] OR;  Service: Orthopedics;  Laterality: Left;  . TEE WITHOUT CARDIOVERSION N/A 03/15/2020   Procedure: TRANSESOPHAGEAL ECHOCARDIOGRAM (TEE);  Surgeon: Jake Bathe, MD;  Location: [facility] OR;  Service: Cardiovascular;  Laterality: N/A;      Social History   Tobacco Use  Smoking Status Current Every Day Smoker  . Packs/day: 0.50  . Years: 5.00  . Pack years: 2.50  . Types: Cigarettes  Smokeless Tobacco Never Used    Social History   Substance and Sexual Activity  Alcohol Use  Yes  . Alcohol/week: 1.0 standard drink  . Types: 1 Cans of beer per week   Comment: occ     Allergies  Allergen Reactions  . Tramadol Other (See Comments)    Upset stomach      Current Facility-Administered Medications  Medication Dose Route Frequency Provider Last Rate Last Admin  . acetaminophen (TYLENOL) tablet 1,000 mg  1,000 mg Oral Q6H PRN Swayze, Ava, DO   1,000 mg at 03/16/20 1853   Or  . acetaminophen (TYLENOL) suppository 650 mg  650 mg Rectal Q6H PRN Swayze, Ava, DO      . buprenorphine-naloxone (SUBOXONE) 8-2 mg per SL tablet 1 tablet  1 tablet Sublingual BID Swayze, Ava, DO   1 tablet at 03/18/20 0901  . ceFEPIme (MAXIPIME) 2 g in sodium chloride 0.9 % 100 mL IVPB  2 g Intravenous Q8H Swayze, Ava, DO 200 mL/hr at 03/18/20 0603 2 g at 03/18/20 0603  . feeding supplement (ENSURE ENLIVE) (ENSURE ENLIVE) liquid 237 mL  237 mL Oral TID BM Rolly Salter, MD   237 mL at 03/18/20 0901  . hydrOXYzine (ATARAX/VISTARIL) tablet 25 mg  25 mg Oral Q6H PRN Swayze, Ava, DO   25 mg at 03/12/20 2106  . ketorolac (TORADOL) 15 MG/ML injection 15 mg  15 mg Intravenous Q6H PRN Rolly Salter, MD      .  loperamide (IMODIUM) capsule 2 mg  2 mg Oral PRN Swayze, Ava, DO      . methocarbamol (ROBAXIN) tablet 500 mg  500 mg Oral Q8H PRN Rolly Salter, MD   500 mg at 03/16/20 1853  . multivitamin with minerals tablet 1 tablet  1 tablet Oral Daily Swayze, Ava, DO   1 tablet at 03/18/20 0901  . ondansetron (ZOFRAN) tablet 4 mg  4 mg Oral Q6H PRN Swayze, Ava, DO       Or  . ondansetron (ZOFRAN) injection 4 mg  4 mg Intravenous Q6H PRN Swayze, Ava, DO      . sodium chloride flush (NS) 0.9 % injection 3 mL  3 mL Intravenous Q12H Swayze, Ava, DO   3 mL at 03/17/20 0918  . vancomycin (VANCOCIN) IVPB 1000 mg/200 mL premix  1,000 mg Intravenous Q8H Swayze, Ava, DO 200 mL/hr at 03/18/20 0859 1,000 mg at 03/18/20 0859    Medications Prior to Admission  Medication Sig Dispense Refill Last Dose  .  hydrOXYzine (ATARAX/VISTARIL) 25 MG tablet Take 1 tablet (25 mg total) by mouth every 8 (eight) hours as needed. (Patient not taking: Reported on 03/11/2020) 12 tablet 0 Not Taking at Unknown time    Family History  Problem Relation Age of Onset  . Cancer Father      Review of Systems:   Review of Systems  Constitutional: Positive for chills, fever and malaise/fatigue.  Respiratory: Positive for cough and shortness of breath.   Cardiovascular: Positive for chest pain.  Gastrointestinal: Positive for abdominal pain.  Musculoskeletal: Positive for joint pain.      Physical Exam: BP 106/71   Pulse (!) 103   Temp 98.2 F (36.8 C) (Oral)   Resp 18   Ht 5\' 11"  (1.803 m)   Wt 64 kg   SpO2 98%   BMI 19.68 kg/m  Physical Exam Constitutional:      General: He is not in acute distress.    Appearance: He is ill-appearing, toxic-appearing and diaphoretic.  HENT:     Head: Normocephalic and atraumatic.  Eyes:     Extraocular Movements: Extraocular movements intact.  Cardiovascular:     Rate and Rhythm: Regular rhythm. Tachycardia present.  Pulmonary:     Effort: No respiratory distress.  Abdominal:     General: Abdomen is flat.  Musculoskeletal:        General: Normal range of motion.     Cervical back: Normal range of motion.  Skin:    General: Skin is warm.  Neurological:     General: No focal deficit present.     Mental Status: He is alert and oriented to person, place, and time.       Diagnostic Studies & Laboratory data:     Echo: 2 mobile vegetations on tricuspid valve.  Severe tricuspid valve regurgitation.  Possible vegetation along the pulmonic valve.    I have independently reviewed the above radiologic studies and discussed with the patient   Recent Lab Findings: Lab Results  Component Value Date   WBC 11.8 (H) 03/18/2020   HGB 7.3 (L) 03/18/2020   HCT 23.5 (L) 03/18/2020   PLT 112 (L) 03/18/2020   GLUCOSE 124 (H) 03/18/2020   ALT 27 03/17/2020    AST 32 03/17/2020   NA 130 (L) 03/18/2020   K 4.1 03/18/2020   CL 97 (L) 03/18/2020   CREATININE 0.66 03/18/2020   BUN 11 03/18/2020   CO2 25 03/18/2020   INR 1.5 (H)  03/11/2020   HGBA1C 6.3 (H) 03/11/2020      Assessment / Plan:   44 year old male with history of IV drug abuse who presents with mobile tricuspid valve vegetations, in the setting of MRSA and Serratia bacteremia.  The vegetations are on the atrial side and would be accessible via angio VAC debridement.  He will require tricuspid cuspid valve repair at some point but in the setting of his ongoing IV drug abuse this is not recommended.  By debriding his tricuspid valve we can prevent any further embolic events to his lung.  He is agreeable to proceed and is tentatively scheduled for 03/21/2020.     I  spent 40 minutes counseling the patient face to face.   Corliss Skains 03/18/2020 10:35 AM

## 2020-03-18 NOTE — Progress Notes (Signed)
PROGRESS NOTE    Robert Kramer  LOV:[id number] DOB: 1975-12-18 DOA: 03/11/2020 PCP: Patient, No Pcp Per     Brief Narrative:  Robert Kramer Frithis a 44 y.o.malewith medical history significant forpolysubstance use disorder including injection drug use and bipolar disorder who presents to the ED for evaluation ofabnormal blood cultures. Patient was seen in [facility] 03/06/2020 for evaluation and management of heroin withdrawal. He was treated supportively. Blood cultures were obtained and have now grown out MRSA and Serratia marcescens. He was called today to come to the ED for further evaluation. CTA chest PE study shows multiple cavitary and noncavitary nodules consistent with septic emboli, bilateral lower lobe pneumonia, mild bilateral hilar adenopathy felt to be reactive. No plan for pulmonary emboli reported.  He was started on IV vancomycin and ceftriaxone. TTE was performed on 03/12/2020. It has demonstratred "shaggy" vegetations on the tricuspid valve. TEE 9/10 revealed 2 x 2cm long vegetations on the tricuspid valve with resultant severe TR. MRI of left shoulder demonstrated septic arthropathy, myositis, and tenosynovitis. He underwent wash out of the left shoulder by orthopedic surgery on 9/10.  He was then transferred to [facility] on 9/12 for further evaluation of his endocarditis by cardiothoracic surgery.  New events last 24 hours / Subjective: He has no new complaints today, denies any chest pain or shortness of breath  Assessment & Plan:   Principal Problem:   Severe sepsis due to methicillin resistant Staphylococcus aureus (MRSA) with acute organ dysfunction (HCC) Active Problems:   Polysubstance dependence (HCC)   Hypokalemia   Thrombocytopenia (HCC)   Serratia septicemia (HCC)   Bipolar 1 disorder (HCC)   Heroin abuse (HCC)   Septic arthritis of shoulder, left (HCC)   Endocarditis of tricuspid valve   Hepatitis C virus infection   IVDU (intravenous drug  user)   Normocytic anemia   Cigarette smoker   Septic pulmonary embolism (HCC)   Severe sepsis secondary to MRSA and Serratia bacteremia, pulmonary septic emboli, TV endocarditis, left shoulder septic arthritis. POA  -S/p left shoulder arthroscopy with debridement irrigation, for septic arthritis with orthopedics by Dr. Dion Saucier 9/10 -Echocardiogram showed tricuspid vegetation -TEE showed 2 x 2 cm mobile vegetations with severe TR -MRI lumbar spine performed shows no evidence of epidural abscess -Patient refused MRI right shoulder ordered by ID -Cardiothoracic surgery evaluating patient for angio vac debridement, tentatively scheduled for 9/16 -Infectious disease following -Repeat blood culture drawn on 9/8 negative  -Continue vancomycin, cefepime  Hyponatremia -Improving  Anemia of chronic disease -Hemoglobin has been stable  Thrombocytopenia -Improving  Heroin use disorder -Continue Suboxone  History of bipolar disorder, depression, anxiety -Not on outpatient medical therapy   DVT prophylaxis:  SCDs Start: 03/11/20 2055  Code Status: Full Family Communication: None at bedside Disposition Plan:   Status is: Inpatient  Remains inpatient appropriate because:Inpatient level of care appropriate due to severity of illness   Dispo: The patient is from: Home              Anticipated d/c is to: Home              Anticipated d/c date is: > 3 days              Patient currently is not medically stable to d/c.  Remains on IV antibiotics.  Undergoing evaluation by cardiothoracic surgery for angio vac procedure, tentatively scheduled for 9/16   Antimicrobials:  Anti-infectives (From admission, onward)   Start  Dose/Rate Route Frequency Ordered Stop   03/12/20 2000  cefTRIAXone (ROCEPHIN) 2 g in sodium chloride 0.9 % 100 mL IVPB  Status:  Discontinued        2 g 200 mL/hr over 30 Minutes Intravenous Every 24 hours 03/11/20 2058 03/12/20 0937   03/12/20 1400  ceFEPIme  (MAXIPIME) 2 g in sodium chloride 0.9 % 100 mL IVPB        2 g 200 mL/hr over 30 Minutes Intravenous Every 8 hours 03/12/20 0937     03/12/20 0000  vancomycin (VANCOCIN) IVPB 1000 mg/200 mL premix        1,000 mg 200 mL/hr over 60 Minutes Intravenous Every 8 hours 03/11/20 1816     03/11/20 1830  cefTRIAXone (ROCEPHIN) 2 g in sodium chloride 0.9 % 100 mL IVPB        2 g 200 mL/hr over 30 Minutes Intravenous  Once 03/11/20 1822 03/11/20 2030   03/11/20 1545  vancomycin (VANCOREADY) IVPB 1500 mg/300 mL        1,500 mg 150 mL/hr over 120 Minutes Intravenous  Once 03/11/20 1532 03/11/20 1819        Objective: Vitals:   03/18/20 0033 03/18/20 0500 03/18/20 0528 03/18/20 0537  BP: 107/66 99/61  106/71  Pulse: (!) 108 (!) 103    Resp: 18 18 18    Temp: 99.3 F (37.4 C) 98.2 F (36.8 C)    TempSrc: Oral Oral    SpO2: 98% 98% 98%   Weight:  64 kg    Height:        Intake/Output Summary (Last 24 hours) at 03/18/2020 1042 Last data filed at 03/18/2020 0900 Gross per 24 hour  Intake 860 ml  Output 1480 ml  Net -620 ml   Filed Weights   03/11/20 1521 03/18/20 0500  Weight: 70.8 kg 64 kg    Examination:  General exam: Appears calm and comfortable  Respiratory system: Clear to auscultation. Respiratory effort normal. No respiratory distress. No conversational dyspnea.  Cardiovascular system: S1 & S2 heard, RRR. No pedal edema. Gastrointestinal system: Abdomen is nondistended, soft and nontender. Normal bowel sounds heard. Central nervous system: Alert and oriented. No focal neurological deficits. Speech clear.  Extremities: Symmetric in appearance  Skin: No rashes, lesions or ulcers on exposed skin  Psychiatry: Judgement and insight appear normal. Mood & affect appropriate.   Data Reviewed: I have personally reviewed following labs and imaging studies  CBC: Recent Labs  Lab 03/14/20 0503 03/16/20 0611 03/16/20 1244 03/17/20 0639 03/18/20 0335  WBC 15.6* 9.6 12.5* 11.8*  11.8*  NEUTROABS 12.2* 7.5 9.9* 9.8* 9.1*  HGB 8.1* 7.2* 7.7* 8.0* 7.3*  HCT 24.5* 21.6* 22.5* 25.4* 23.5*  MCV 85.7 84.0 81.2 88.8 86.4  PLT 47* 61* 78* 98* 112*   Basic Metabolic Panel: Recent Labs  Lab 03/11/20 2033 03/12/20 0400 03/16/20 0611 03/16/20 1244 03/17/20 0639 03/17/20 1058 03/18/20 0335  NA  --    < > 130* 128* 126* 129* 130*  K  --    < > 5.4* 5.1 4.1 5.5* 4.1  CL  --    < > 97* 97* 94* 96* 97*  CO2  --    < > 24 25 26 26 25   GLUCOSE  --    < > 134* 100* 106* 125* 124*  BUN  --    < > 21* 16 14 16 11   CREATININE  --    < > 0.99 0.54* 0.54* 0.59* 0.66  CALCIUM  --    < >  8.1* 7.9* 7.9* 8.0* 7.9*  MG 2.0  --   --   --  1.8  --  1.8   < > = values in this interval not displayed.   GFR: Estimated Creatinine Clearance: 104.4 mL/min (by C-G formula based on SCr of 0.66 mg/dL). Liver Function Tests: Recent Labs  Lab 03/11/20 1536 03/12/20 0400 03/17/20 0639  AST 67* 56* 32  ALT 32 28 27  ALKPHOS 76 63 50  BILITOT 1.5* 1.0 0.9  PROT 6.5 5.5* 6.3*  ALBUMIN 1.7* 1.3* 1.3*   No results for input(s): LIPASE, AMYLASE in the last 168 hours. No results for input(s): AMMONIA in the last 168 hours. Coagulation Profile: Recent Labs  Lab 03/11/20 1536  INR 1.5*   Cardiac Enzymes: No results for input(s): CKTOTAL, CKMB, CKMBINDEX, TROPONINI in the last 168 hours. BNP (last 3 results) No results for input(s): PROBNP in the last 8760 hours. HbA1C: No results for input(s): HGBA1C in the last 72 hours. CBG: No results for input(s): GLUCAP in the last 168 hours. Lipid Profile: No results for input(s): CHOL, HDL, LDLCALC, TRIG, CHOLHDL, LDLDIRECT in the last 72 hours. Thyroid Function Tests: No results for input(s): TSH, T4TOTAL, FREET4, T3FREE, THYROIDAB in the last 72 hours. Anemia Panel: No results for input(s): VITAMINB12, FOLATE, FERRITIN, TIBC, IRON, RETICCTPCT in the last 72 hours. Sepsis Labs: Recent Labs  Lab 03/11/20 1536 03/11/20 1830  03/11/20 2155 03/12/20 0400  LATICACIDVEN 2.0* 2.5* 2.0* 1.2    Recent Results (from the past 240 hour(s))  Urine culture     Status: Abnormal   Collection Time: 03/11/20  3:28 PM   Specimen: In/Out Cath Urine  Result Value Ref Range Status   Specimen Description   Final    IN/OUT CATH URINE Performed at [facility], [address], [place], [place] [address]    Special Requests   Final    NONE Performed at [facility], [address], [place], [place] [address]    Culture MULTIPLE SPECIES PRESENT, SUGGEST RECOLLECTION (A)  Final   Report Status 03/13/2020 FINAL  Final  Blood culture (routine x 2)     Status: Abnormal   Collection Time: 03/11/20  3:31 PM   Specimen: BLOOD  Result Value Ref Range Status   Specimen Description   Final    BLOOD LEFT ARM Performed at [facility], [address], [place], [place] [address]    Special Requests   Final    BOTTLES DRAWN AEROBIC AND ANAEROBIC Blood Culture results may not be optimal due to an excessive volume of blood received in culture bottles Performed at [facility], [address] [place], [place] [address]    Culture  Setup Time   Final    GRAM POSITIVE COCCI IN CLUSTERS IN BOTH AEROBIC AND ANAEROBIC BOTTLES CRITICAL VALUE NOTED.  VALUE IS CONSISTENT WITH PREVIOUSLY REPORTED AND CALLED VALUE.    Culture (A)  Final    STAPHYLOCOCCUS AUREUS SUSCEPTIBILITIES PERFORMED ON PREVIOUS CULTURE WITHIN THE LAST 5 DAYS. Performed at [facility], [address] [place], [place] [address]    Report Status 03/14/2020 FINAL  Final  SARS Coronavirus 2 by RT PCR (hospital order, performed in [facility] hospital lab) Nasopharyngeal Nasopharyngeal Swab     Status: None   Collection Time: 03/11/20  5:05 PM   Specimen: Nasopharyngeal Swab  Result Value Ref Range Status   SARS Coronavirus 2 NEGATIVE NEGATIVE Final  Comment: (NOTE) SARS-CoV-2 target  nucleic acids are NOT DETECTED.  The SARS-CoV-2 RNA is generally detectable in upper and lower respiratory specimens during the acute phase of infection. The lowest concentration of SARS-CoV-2 viral copies this assay can detect is 250 copies / mL. A negative result does not preclude SARS-CoV-2 infection and should not be used as the sole basis for treatment or other patient management decisions.  A negative result may occur with improper specimen collection / handling, submission of specimen other than nasopharyngeal swab, presence of viral mutation(s) within the areas targeted by this assay, and inadequate number of viral copies (<250 copies / mL). A negative result must be combined with clinical observations, patient history, and epidemiological information.  Fact Sheet for Patients:   BoilerBrush.com.cyhttps://www.fda.gov/media/136312/download  Fact Sheet for Healthcare Providers: https://pope.com/https://www.fda.gov/media/136313/download  This test is not yet approved or  cleared by the [place] FDA and has been authorized for detection and/or diagnosis of SARS-CoV-2 by FDA under an Emergency Use Authorization (EUA).  This EUA will remain in effect (meaning this test can be used) for the duration of the COVID-19 declaration under Section 564(b)(1) of the Act, 21 U.S.C. section 360bbb-3(b)(1), unless the authorization is terminated or revoked sooner.  Performed at [facility], [address] [place], [place] [address]   Culture, blood (Routine X 2) w Reflex to ID Panel     Status: None (Preliminary result)   Collection Time: 03/13/20 11:09 AM   Specimen: BLOOD  Result Value Ref Range Status   Specimen Description   Final    BLOOD RIGHT ANTECUBITAL Performed at [facility], [address] [place], [place] [address]    Special Requests   Final    BOTTLES DRAWN AEROBIC AND ANAEROBIC Blood Culture adequate volume Performed at [facility], [address] [place], [place] [address]    Culture   Final    NO GROWTH 4 DAYS Performed at [facility], [address] [place], [place] [address]    Report Status PENDING  Incomplete  Culture, blood (Routine X 2) w Reflex to ID Panel     Status: None (Preliminary result)   Collection Time: 03/13/20 11:10 AM   Specimen: BLOOD RIGHT HAND  Result Value Ref Range Status   Specimen Description   Final    BLOOD RIGHT HAND Performed at [facility], [address] [place], [place] [address]    Special Requests   Final    BOTTLES DRAWN AEROBIC ONLY Blood Culture results may not be optimal due to an inadequate volume of blood received in culture bottles Performed at [facility], [address] [place], [place] [address]    Culture   Final    NO GROWTH 4 DAYS Performed at [facility], [address] [place], [place] [address]    Report Status PENDING  Incomplete  Surgical pcr screen     Status: None   Collection Time: 03/15/20 11:56 AM   Specimen: Nasal Mucosa; Nasal Swab  Result Value Ref Range Status   MRSA, PCR NEGATIVE NEGATIVE Final   Staphylococcus aureus NEGATIVE NEGATIVE Final    Comment: (NOTE) The Xpert SA Assay (FDA approved for NASAL specimens in patients 46 years of age and older), is one component of a comprehensive surveillance program. It is not intended to diagnose infection nor to guide or monitor treatment. Performed at [facility], [address] [place], [place] [address]  Aerobic/Anaerobic Culture (surgical/deep wound)     Status: None (Preliminary result)   Collection Time: 03/15/20  2:07 PM   Specimen: Synovial, Left Shoulder; Body Fluid  Result Value Ref Range Status   Specimen Description FLUID SYNOVIAL LEFT SHOULDER  Final   Special Requests NONE  Final   Gram Stain   Final    ABUNDANT WBC PRESENT, PREDOMINANTLY PMN RARE GRAM POSITIVE COCCI Performed at [facility], [address], [place], [place] [address]    Culture   Final    RARE STAPHYLOCOCCUS AUREUS SUSCEPTIBILITIES TO FOLLOW NO ANAEROBES ISOLATED; CULTURE IN PROGRESS FOR 5 DAYS    Report Status PENDING  Incomplete      Radiology Studies: MR Lumbar Spine W Wo Contrast  Result Date: 03/16/2020 CLINICAL DATA:  Severe sepsis due to MR SA.  Epidural abscess. EXAM: MRI LUMBAR SPINE WITHOUT AND WITH CONTRAST TECHNIQUE: Multiplanar and multiecho pulse sequences of the lumbar spine were obtained without and with intravenous contrast. CONTRAST:  44mL GADAVIST GADOBUTROL 1 MMOL/ML IV SOLN COMPARISON:  None. FINDINGS: Segmentation: 5 non rib-bearing lumbar type vertebral bodies are present. The lowest fully formed vertebral body is L5. Alignment:  No significant listhesis is present. Vertebrae: Edematous endplate marrow changes are present on the right at L4-5 with subtle associated enhancement. Although there is T2 signal in the disc space at L4-5, no enhancement is associated. Enhancement is noted in the paraspinous soft tissues on the right at L4-5 without a discrete abscess. No enhancement is present within the musculature. Conus medullaris and cauda equina: Conus extends to the L1 level. Conus and cauda equina appear normal. Paraspinal and other soft tissues: Asymmetric T2 signal and enhancement is present in the right paraspinous space at L4-5. No signal changes or enhancement are present in the adjacent muscle. Disc levels: L1-2: Negative. L2-3: Negative. L3-4: Disc desiccation and diffuse bulging is present without significant focal disc protrusion or stenosis. L4-5: A rightward disc protrusion is present. The central canal is patent. Disc extends into the right foramen with mild right foraminal stenosis. L5-S1: Negative. IMPRESSION: 1. Edematous endplate marrow changes and enhancement on the right at L4-5 likely represents degenerative change. No definite infection is present. 2. Rightward disc protrusion at L4-5 with mild  right foraminal stenosis. 3. Enhancement in the right paraspinous soft tissues at L4-5 without a discrete abscess. This may be reactive. No definite infection is present. 4. Disc desiccation and bulging at L3-4 without significant stenosis. Electronically Signed   By: Marin Roberts M.D.   On: 03/16/2020 14:34      Scheduled Meds: . buprenorphine-naloxone  1 tablet Sublingual BID  . feeding supplement (ENSURE ENLIVE)  237 mL Oral TID BM  . multivitamin with minerals  1 tablet Oral Daily  . sodium chloride flush  3 mL Intravenous Q12H   Continuous Infusions: . ceFEPime (MAXIPIME) IV 2 g (03/18/20 0603)  . vancomycin 1,000 mg (03/18/20 0859)     LOS: 7 days      Time spent: 35 minutes   Noralee Stain, DO [facility] 03/18/2020, 10:42 AM   Available via Epic secure chat 7am-7pm After these hours, please refer to coverage provider listed on [email or web address]

## 2020-03-18 NOTE — Progress Notes (Signed)
Subjective: 3 Days Post-Op s/p Procedure(s): ARTHROSCOPY SHOULDER, IRRIGATION AND DEBRIDEMENT OF SHOULDER BURSECTOMY  Patient states left shoulder is sore though improved from last week. Denies paraesthesias. Denies pain at any other joints.  Objective:  PE: VITALS:   Vitals:   03/18/20 0500 03/18/20 0528 03/18/20 0537 03/18/20 1153  BP: 99/61  106/71 107/68  Pulse: (!) 103   (!) 102  Resp: 18 18  20   Temp: 98.2 F (36.8 C)   98.6 F (37 C)  TempSrc: Oral   Oral  SpO2: 98% 98%  98%  Weight: 64 kg     Height:       General: sitting up in bed, in no acute distress MSK: LUE - 20 degrees of active FF, limited due to pain. No significant edema at left shoulder. Skin hot to touch and erythematous. Distal sensation intact. 2+ radial pulse. Full ROM at all fingers of left hand and left wrist.  RUE - no TTP to right shoulder, AROM 0-170 without pain. Able to pull himself up the bed with this arm with no pain.    LABS  Results for orders placed or performed during the hospital encounter of 03/11/20 (from the past 24 hour(s))  Basic metabolic panel     Status: Abnormal   Collection Time: 03/18/20  3:35 AM  Result Value Ref Range   Sodium 130 (L) 135 - 145 mmol/L   Potassium 4.1 3.5 - 5.1 mmol/L   Chloride 97 (L) 98 - 111 mmol/L   CO2 25 22 - 32 mmol/L   Glucose, Bld 124 (H) 70 - 99 mg/dL   BUN 11 6 - 20 mg/dL   Creatinine, Ser 03/20/20 0.61 - 1.24 mg/dL   Calcium 7.9 (L) 8.9 - 10.3 mg/dL   GFR calc non Af Amer >60 >60 mL/min   GFR calc Af Amer >60 >60 mL/min   Anion gap 8 5 - 15  CBC with Differential/Platelet     Status: Abnormal   Collection Time: 03/18/20  3:35 AM  Result Value Ref Range   WBC 11.8 (H) 4.0 - 10.5 K/uL   RBC 2.72 (L) 4.22 - 5.81 MIL/uL   Hemoglobin 7.3 (L) 13.0 - 17.0 g/dL   HCT 03/20/20 (L) 39 - 52 %   MCV 86.4 80.0 - 100.0 fL   MCH 26.8 26.0 - 34.0 pg   MCHC 31.1 30.0 - 36.0 g/dL   RDW 78.6 76.7 - 20.9 %   Platelets 112 (L) 150 - 400 K/uL   nRBC 0.0  0.0 - 0.2 %   Neutrophils Relative % 78 %   Neutro Abs 9.1 (H) 1.7 - 7.7 K/uL   Lymphocytes Relative 14 %   Lymphs Abs 1.7 0.7 - 4.0 K/uL   Monocytes Relative 7 %   Monocytes Absolute 0.8 0 - 1 K/uL   Eosinophils Relative 0 %   Eosinophils Absolute 0.0 0 - 0 K/uL   Basophils Relative 0 %   Basophils Absolute 0.0 0 - 0 K/uL   Immature Granulocytes 1 %   Abs Immature Granulocytes 0.11 (H) 0.00 - 0.07 K/uL  Magnesium     Status: None   Collection Time: 03/18/20  3:35 AM  Result Value Ref Range   Magnesium 1.8 1.7 - 2.4 mg/dL    MR Lumbar Spine W Wo Contrast  Result Date: 03/16/2020 CLINICAL DATA:  Severe sepsis due to MR SA.  Epidural abscess. EXAM: MRI LUMBAR SPINE WITHOUT AND WITH CONTRAST TECHNIQUE: Multiplanar and multiecho  pulse sequences of the lumbar spine were obtained without and with intravenous contrast. CONTRAST:  45mL GADAVIST GADOBUTROL 1 MMOL/ML IV SOLN COMPARISON:  None. FINDINGS: Segmentation: 5 non rib-bearing lumbar type vertebral bodies are present. The lowest fully formed vertebral body is L5. Alignment:  No significant listhesis is present. Vertebrae: Edematous endplate marrow changes are present on the right at L4-5 with subtle associated enhancement. Although there is T2 signal in the disc space at L4-5, no enhancement is associated. Enhancement is noted in the paraspinous soft tissues on the right at L4-5 without a discrete abscess. No enhancement is present within the musculature. Conus medullaris and cauda equina: Conus extends to the L1 level. Conus and cauda equina appear normal. Paraspinal and other soft tissues: Asymmetric T2 signal and enhancement is present in the right paraspinous space at L4-5. No signal changes or enhancement are present in the adjacent muscle. Disc levels: L1-2: Negative. L2-3: Negative. L3-4: Disc desiccation and diffuse bulging is present without significant focal disc protrusion or stenosis. L4-5: A rightward disc protrusion is present. The  central canal is patent. Disc extends into the right foramen with mild right foraminal stenosis. L5-S1: Negative. IMPRESSION: 1. Edematous endplate marrow changes and enhancement on the right at L4-5 likely represents degenerative change. No definite infection is present. 2. Rightward disc protrusion at L4-5 with mild right foraminal stenosis. 3. Enhancement in the right paraspinous soft tissues at L4-5 without a discrete abscess. This may be reactive. No definite infection is present. 4. Disc desiccation and bulging at L3-4 without significant stenosis. Electronically Signed   By: Marin Roberts M.D.   On: 03/16/2020 14:34    Assessment/Plan: Principal Problem:   Severe sepsis due to methicillin resistant Staphylococcus aureus (MRSA) with acute organ dysfunction (HCC) Active Problems:   Polysubstance dependence (HCC)   Hypokalemia   Thrombocytopenia (HCC)   Serratia septicemia (HCC)   Bipolar 1 disorder (HCC)   Heroin abuse (HCC)   Arthritis, septic, shoulder (HCC)   Endocarditis of tricuspid valve   Hepatitis C virus infection   IVDU (intravenous drug user)   Normocytic anemia   Cigarette smoker   Septic pulmonary embolism (HCC)  L shoulder septic arthritis with ongoing severe sepsis secondary to MRSA and Serratia bacteremia 3 Days Post-Op s/p Procedure(s): ARTHROSCOPY SHOULDER, IRRIGATION AND DEBRIDEMENT OF SHOULDER BURSECTOMY - overall doing fair - ok to be active with shoulder as much as he can tolerate, WBAT - continue OT and PT as tolerated - Dressings changed today, dressing changes PRN - antibiotics per ID - intraoperative culture grew MRSA - on suboxone for pain control  Patient originally complained of right shoulder pain, however, today complains of no right shoulder pain and has 170 degrees of forward flexion at right shoulder. Will hold off on right shoulder MRI unless warranted in the future.   Will plan to follow intermittently during hospital course  Contact  information:   Weekdays 8-5 Janine Ores, [place] [phone] A fter hours and holidays please check [email or web address] for group call information for [facility]  Armida Sans 03/18/2020, 1:48 PM

## 2020-03-18 NOTE — Progress Notes (Signed)
   03/18/20 0500  Assess: MEWS Score  Temp 98.2 F (36.8 C)  BP 99/61  Pulse Rate (!) 103  ECG Heart Rate (!) 103  Resp 18  SpO2 98 %  O2 Device Room Air  Assess: MEWS Score  MEWS Temp 0  MEWS Systolic 1  MEWS Pulse 1  MEWS RR 0  MEWS LOC 0  MEWS Score 2  MEWS Score Color Yellow  Assess: if the MEWS score is Yellow or Red  Were vital signs taken at a resting state? Yes  Focused Assessment No change from prior assessment  Early Detection of Sepsis Score *See Row Information* Low  MEWS guidelines implemented *See Row Information* Yes  Treat  MEWS Interventions Other (Comment) (assessment and VS rechecked)  Take Vital Signs  Increase Vital Sign Frequency  Yellow: Q 2hr X 2 then Q 4hr X 2, if remains yellow, continue Q 4hrs  Escalate  MEWS: Escalate Yellow: discuss with charge nurse/RN and consider discussing with provider and RRT  Notify: Charge Nurse/RN  Name of Charge Nurse/RN Notified Jill Tsoutis RN  Date Charge Nurse/RN Notified 03/18/20  Time Charge Nurse/RN Notified 0533  Document  Patient Outcome Stabilized after interventions  Progress note created (see row info) Yes

## 2020-03-19 ENCOUNTER — Inpatient Hospital Stay: Payer: Self-pay

## 2020-03-19 DIAGNOSIS — A4101 Sepsis due to Methicillin susceptible Staphylococcus aureus: Secondary | ICD-10-CM

## 2020-03-19 LAB — CBC
HCT: 24.9 % — ABNORMAL LOW (ref 39.0–52.0)
Hemoglobin: 8 g/dL — ABNORMAL LOW (ref 13.0–17.0)
MCH: 28 pg (ref 26.0–34.0)
MCHC: 32.1 g/dL (ref 30.0–36.0)
MCV: 87.1 fL (ref 80.0–100.0)
Platelets: 131 10*3/uL — ABNORMAL LOW (ref 150–400)
RBC: 2.86 MIL/uL — ABNORMAL LOW (ref 4.22–5.81)
RDW: 15.4 % (ref 11.5–15.5)
WBC: 10.5 10*3/uL (ref 4.0–10.5)
nRBC: 0 % (ref 0.0–0.2)

## 2020-03-19 LAB — BASIC METABOLIC PANEL
Anion gap: 5 (ref 5–15)
BUN: 11 mg/dL (ref 6–20)
CO2: 29 mmol/L (ref 22–32)
Calcium: 8.2 mg/dL — ABNORMAL LOW (ref 8.9–10.3)
Chloride: 97 mmol/L — ABNORMAL LOW (ref 98–111)
Creatinine, Ser: 0.62 mg/dL (ref 0.61–1.24)
GFR calc Af Amer: >60 mL/min (ref >60)
GFR calc non Af Amer: >60 mL/min (ref >60)
Glucose, Bld: 92 mg/dL (ref 70–99)
Potassium: 3.6 mmol/L (ref 3.5–5.1)
Sodium: 131 mmol/L — ABNORMAL LOW (ref 135–145)

## 2020-03-19 LAB — VANCOMYCIN, TROUGH: Vancomycin Tr: 28 ug/mL (ref 15–20)

## 2020-03-19 MED ORDER — SODIUM CHLORIDE 0.9% FLUSH
10.0000 mL | Freq: Two times a day (BID) | INTRAVENOUS | Status: DC
  Administered 2020-03-19 – 2020-04-22 (×29): 10 mL

## 2020-03-19 MED ORDER — VANCOMYCIN HCL IN DEXTROSE 1-5 GM/200ML-% IV SOLN
1000.0000 mg | Freq: Two times a day (BID) | INTRAVENOUS | Status: DC
  Administered 2020-03-19 – 2020-03-22 (×6): 1000 mg via INTRAVENOUS
  Filled 2020-03-19 (×6): qty 200

## 2020-03-19 MED ORDER — SODIUM CHLORIDE 0.9% FLUSH: 10.0000 mL | INTRAVENOUS | Status: DC | PRN

## 2020-03-19 MED ORDER — CHLORHEXIDINE GLUCONATE CLOTH 2 % EX PADS
6.0000 | MEDICATED_PAD | Freq: Every day | CUTANEOUS | Status: DC
  Administered 2020-03-19 – 2020-04-22 (×31): 6 via TOPICAL

## 2020-03-19 NOTE — Progress Notes (Signed)
[facility]  Date of Admission:  03/11/2020   Total days of antibiotics 8       Vancomycine Day 8       Ceftriaxone Day 2         ASSESSMENT: Robert Kramer has MRSA and Serratia bacteremia secondary to IV drug use, complicated by tricuspid valve endocarditis, septic pulmonary emboli and left shoulder septic arthritis.  Continue vancomycin and ceftriaxone for total duration of 6 weeks.  His last negative blood culture was 9/8.  Cardiothoracic surgery has seen the patient and planned angio VAC on Thursday 9/16.  Patient agrees with plan to stay in the hospital for 6 weeks of IV antibiotics.    PLAN: -Continue vancomycin and ceftriaxone for total duration of 6 weeks -PICC line order placed -Planned angio [facility] 9/16 -Pending HCV RNA quant  Principal Problem:   Severe sepsis due to methicillin resistant Staphylococcus aureus (MRSA) with acute organ dysfunction (HCC) Active Problems:   Serratia septicemia (HCC)   Arthritis, septic, shoulder (HCC)   Endocarditis of tricuspid valve   Hepatitis C virus infection   Polysubstance dependence (HCC)   Hypokalemia   Thrombocytopenia (HCC)   Bipolar 1 disorder (HCC)   Heroin abuse (HCC)   IVDU (intravenous drug user)   Normocytic anemia   Cigarette smoker   Septic pulmonary embolism (HCC)   Scheduled Meds:  buprenorphine-naloxone  1 tablet Sublingual BID   feeding supplement (ENSURE ENLIVE)  237 mL Oral TID BM   multivitamin with minerals  1 tablet Oral Daily   sodium chloride flush  3 mL Intravenous Q12H   Continuous Infusions:  cefTRIAXone (ROCEPHIN)  IV 2 g (03/18/20 1553)   vancomycin 1,000 mg (03/19/20 0112)   PRN Meds:.acetaminophen **OR** acetaminophen, hydrOXYzine, ketorolac, loperamide, methocarbamol, ondansetron **OR** ondansetron (ZOFRAN) IV   SUBJECTIVE: Patient is seen at bedside today.  He appears comfortable and in no acute distress.  He reports feeling better.  He denies chest  pain, shortness of breath, ankle pain.  He still complains of low back pain and rate 8/10.   When asked about the willingness to stay in the hospital for a long antibiotic course, patient is determined to stay and get better.  He also has good support system at home with brother and sister in [place] where he will be staying.  Review of Systems: Review of Systems  HENT: Negative.   Eyes: Negative.   Respiratory: Negative for shortness of breath.   Cardiovascular: Negative for chest pain.  Musculoskeletal: Positive for back pain and joint pain (left shoulder ).  Neurological: Negative.     Allergies  Allergen Reactions   Tramadol Other (See Comments)    Upset stomach    OBJECTIVE: Vitals:   03/18/20 2045 03/19/20 0012 03/19/20 0539 03/19/20 0543  BP: 110/72 100/66 99/72   Pulse: 99 86 98   Resp: (!) 21 19    Temp: 98.3 F (36.8 C) 97.6 F (36.4 C) 98.1 F (36.7 C)   TempSrc: Oral Oral Oral   SpO2: 98% 96% 95%   Weight:    64 kg  Height:       Body mass index is 19.68 kg/m.  Physical Exam Constitutional:      General: He is not in acute distress. HENT:     Head: Normocephalic.  Eyes:     General: No scleral icterus.       Right eye: No discharge.  Left eye: No discharge.  Cardiovascular:     Rate and Rhythm: Regular rhythm. Tachycardia present.     Heart sounds: No murmur heard.   Pulmonary:     Effort: No respiratory distress.     Breath sounds: Normal breath sounds.  Abdominal:     General: Bowel sounds are normal.  Musculoskeletal:     Cervical back: Normal range of motion.     Right lower leg: No edema.     Left lower leg: No edema.     Comments: Tenderness and stiffness of left shoulder  Skin:    General: Skin is warm.     Coloration: Skin is not jaundiced.  Neurological:     Mental Status: He is alert.  Psychiatric:        Mood and Affect: Mood normal.     Lab Results Lab Results  Component Value Date   WBC 11.8 (H) 03/18/2020    HGB 7.3 (L) 03/18/2020   HCT 23.5 (L) 03/18/2020   MCV 86.4 03/18/2020   PLT 112 (L) 03/18/2020    Lab Results  Component Value Date   CREATININE 0.66 03/18/2020   BUN 11 03/18/2020   NA 130 (L) 03/18/2020   K 4.1 03/18/2020   CL 97 (L) 03/18/2020   CO2 25 03/18/2020    Lab Results  Component Value Date   ALT 27 03/17/2020   AST 32 03/17/2020   ALKPHOS 50 03/17/2020   BILITOT 0.9 03/17/2020     Microbiology: Recent Results (from the past 240 hour(s))  Urine culture     Status: Abnormal   Collection Time: 03/11/20  3:28 PM   Specimen: In/Out Cath Urine  Result Value Ref Range Status   Specimen Description   Final    IN/OUT CATH URINE Performed at [facility], [address] [place], [place] [address]    Special Requests   Final    NONE Performed at [facility], [address] [place], [place] [address]    Culture MULTIPLE SPECIES PRESENT, SUGGEST RECOLLECTION (A)  Final   Report Status 03/13/2020 FINAL  Final  Blood culture (routine x 2)     Status: Abnormal   Collection Time: 03/11/20  3:31 PM   Specimen: BLOOD  Result Value Ref Range Status   Specimen Description   Final    BLOOD LEFT ARM Performed at [facility], [address] [place], [place] [address]    Special Requests   Final    BOTTLES DRAWN AEROBIC AND ANAEROBIC Blood Culture results may not be optimal due to an excessive volume of blood received in culture bottles Performed at [facility], [address] [place], [place] [address]    Culture  Setup Time   Final    GRAM POSITIVE COCCI IN CLUSTERS IN BOTH AEROBIC AND ANAEROBIC BOTTLES CRITICAL VALUE NOTED.  VALUE IS CONSISTENT WITH PREVIOUSLY REPORTED AND CALLED VALUE.    Culture (A)  Final    STAPHYLOCOCCUS AUREUS SUSCEPTIBILITIES PERFORMED ON PREVIOUS CULTURE WITHIN THE LAST 5 DAYS. Performed at [facility], [address] [place], [place] [address]     Report Status 03/14/2020 FINAL  Final  SARS Coronavirus 2 by RT PCR (hospital order, performed in [facility] hospital lab) Nasopharyngeal Nasopharyngeal Swab     Status: None   Collection Time: 03/11/20  5:05 PM   Specimen: Nasopharyngeal Swab  Result Value Ref Range Status   SARS Coronavirus 2 NEGATIVE NEGATIVE  Final    Comment: (NOTE) SARS-CoV-2 target nucleic acids are NOT DETECTED.  The SARS-CoV-2 RNA is generally detectable in upper and lower respiratory specimens during the acute phase of infection. The lowest concentration of SARS-CoV-2 viral copies this assay can detect is 250 copies / mL. A negative result does not preclude SARS-CoV-2 infection and should not be used as the sole basis for treatment or other patient management decisions.  A negative result may occur with improper specimen collection / handling, submission of specimen other than nasopharyngeal swab, presence of viral mutation(s) within the areas targeted by this assay, and inadequate number of viral copies (<250 copies / mL). A negative result must be combined with clinical observations, patient history, and epidemiological information.  Fact Sheet for Patients:   BoilerBrush.com.cy  Fact Sheet for Healthcare Providers: https://pope.com/  This test is not yet approved or  cleared by the [place] FDA and has been authorized for detection and/or diagnosis of SARS-CoV-2 by FDA under an Emergency Use Authorization (EUA).  This EUA will remain in effect (meaning this test can be used) for the duration of the COVID-19 declaration under Section 564(b)(1) of the Act, 21 U.S.C. section 360bbb-3(b)(1), unless the authorization is terminated or revoked sooner.  Performed at [facility], [address], [place], [place] [address]   Culture, blood (Routine X 2) w Reflex to ID Panel     Status: None (Preliminary result)   Collection Time:  03/13/20 11:09 AM   Specimen: BLOOD  Result Value Ref Range Status   Specimen Description   Final    BLOOD RIGHT ANTECUBITAL Performed at [facility], [address], [place], [place] [address]    Special Requests   Final    BOTTLES DRAWN AEROBIC AND ANAEROBIC Blood Culture adequate volume Performed at [facility], [address], [place], [place] [address]    Culture   Final    NO GROWTH 4 DAYS Performed at [facility], [address] [place], [place] [address]    Report Status PENDING  Incomplete  Culture, blood (Routine X 2) w Reflex to ID Panel     Status: None (Preliminary result)   Collection Time: 03/13/20 11:10 AM   Specimen: BLOOD RIGHT HAND  Result Value Ref Range Status   Specimen Description   Final    BLOOD RIGHT HAND Performed at [facility], [address], [place], [place] [address]    Special Requests   Final    BOTTLES DRAWN AEROBIC ONLY Blood Culture results may not be optimal due to an inadequate volume of blood received in culture bottles Performed at [facility], [address] [place], [place] [address]    Culture   Final    NO GROWTH 4 DAYS Performed at [facility], [address], [place], [place] [address]    Report Status PENDING  Incomplete  Surgical pcr screen     Status: None   Collection Time: 03/15/20 11:56 AM   Specimen: Nasal Mucosa; Nasal Swab  Result Value Ref Range Status   MRSA, PCR NEGATIVE NEGATIVE Final   Staphylococcus aureus NEGATIVE NEGATIVE Final    Comment: (NOTE) The Xpert SA Assay (FDA approved for NASAL specimens in patients 16 years of age and older), is one component of a comprehensive surveillance program. It is not intended to diagnose infection nor to guide or monitor treatment. Performed at [facility], [address]  [place], [place] [address]   Aerobic/Anaerobic Culture (surgical/deep wound)     Status: None  (Preliminary result)   Collection Time: 03/15/20  2:07 PM   Specimen: Synovial, Left Shoulder; Body Fluid  Result Value Ref Range Status   Specimen Description FLUID SYNOVIAL LEFT SHOULDER  Final   Special Requests NONE  Final   Gram Stain   Final    ABUNDANT WBC PRESENT, PREDOMINANTLY PMN RARE GRAM POSITIVE COCCI Performed at [facility], [address], [place], [place] [address]    Culture   Final    RARE METHICILLIN RESISTANT STAPHYLOCOCCUS AUREUS NO ANAEROBES ISOLATED; CULTURE IN PROGRESS FOR 5 DAYS    Report Status PENDING  Incomplete   Organism ID, Bacteria METHICILLIN RESISTANT STAPHYLOCOCCUS AUREUS  Final      Susceptibility   Methicillin resistant staphylococcus aureus - MIC*    CIPROFLOXACIN <=0.5 SENSITIVE Sensitive     ERYTHROMYCIN >=8 RESISTANT Resistant     GENTAMICIN <=0.5 SENSITIVE Sensitive     OXACILLIN >=4 RESISTANT Resistant     TETRACYCLINE <=1 SENSITIVE Sensitive     VANCOMYCIN 1 SENSITIVE Sensitive     TRIMETH/SULFA <=10 SENSITIVE Sensitive     CLINDAMYCIN <=0.25 SENSITIVE Sensitive     RIFAMPIN <=0.5 SENSITIVE Sensitive     Inducible Clindamycin NEGATIVE Sensitive     * RARE METHICILLIN RESISTANT STAPHYLOCOCCUS AUREUS    Doran Stabler, DO [facility] [phone] pager    03/19/2020, 10:18 AM

## 2020-03-19 NOTE — Progress Notes (Signed)
Pharmacy Antibiotic Note  Robert Kramer is a 44 y.o. 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 Pharmacy has been consulted for Vancomycin dosing.  MRSA & Serratia bacteremia Tricuspid valve endocarditis per TEE; TCTS planning debridement via angiovac 9/16. L shoulder septic arthritis s/p extensive I&D 9/10- cx w/ rare SA  WBC 10.5 SCr remains WNL  Tmax 101 overnight  Vancomycin trough drawn this morning resulted well above goal at 28. Dose already given while awaiting lab results. Will skip afternoon dosing and restart tonight at lower dosing regimen of 1g IV q12 hours. New expected trough ~16. Will recheck level later this week.   Plan: Reduce vancomycin to 1000 mg Q 12 hours  Continue ceftriaxone 2g q24h for serratia   Height: 5\' 11"  (180.3 cm) Weight: 64 kg (141 lb 1.6 oz) IBW/kg (Calculated) : 75.3  Temp (24hrs), Avg:98.8 F (37.1 C), Min:97.2 F (36.2 C), Max:101 F (38.3 C)  Recent Labs  Lab 03/13/20 0809 03/14/20 0503 03/16/20 05/16/20 03/16/20 0611 03/16/20 1244 03/17/20 0639 03/17/20 1058 03/18/20 0335 03/19/20 0904  WBC  --    < > 9.6  --  12.5* 11.8*  --  11.8* 10.5  CREATININE  --    < > 0.99   < > 0.54* 0.54* 0.59* 0.66 0.62  VANCOTROUGH 16  --   --   --   --   --   --   --   --    < > = values in this interval not displayed.    Estimated Creatinine Clearance: 104.4 mL/min (by C-G formula based on SCr of 0.62 mg/dL).    Allergies  Allergen Reactions  . Tramadol Other (See Comments)    Upset stomach   Antimicrobials this admission: Vancomycin 9/6 >>  Rocephin 9/6 >>9/7, 9/13 Cefepime 9/7 >>9/13  Dose adjustments this admission: 9/8 0730 VT: 16  on vanc 1g IV q8h 9/14 VT: 28: Vanc 1g q8>>Vanc 1g q12 hours  Microbiology results: 9/1 BCx x1: MRSA 9/1 BCx x1: Serratia Marcescens (S=Rocephin, Bactrim, Cipro; R=Cefazolin) 9/6 UCx: mult sp F 9/6 BCx: MRSA F 9/6 COVID: neg 9/8 Hep A  reactive 9/8 Hep B NR 9/8 Hep C reactive 9/8 BCx2 ngtd 9/10 MRSA PCR neg MSSA PCR neg 9/10 L shoulder synovial fluid: rare SA Thank you for allowing pharmacy to be a part of this patient's care.  11/8 PharmD., BCPS Clinical Pharmacist 03/19/2020 11:37 AM

## 2020-03-19 NOTE — Progress Notes (Signed)
Nutrition Follow-up  DOCUMENTATION CODES:   Not applicable  INTERVENTION:    Continue Ensure Enlive po TID, each supplement provides 350 kcal and 20 grams of protein.  Continue MVI with minerals daily.  NUTRITION DIAGNOSIS:   Increased nutrient needs related to acute illness (sepsis) as evidenced by estimated needs.  Ongoing   GOAL:   Patient will meet greater than or equal to 90% of their needs  Progressing   MONITOR:   PO intake, Supplement acceptance, Labs, Weight trends, I & O's  REASON FOR ASSESSMENT:   Consult Assessment of nutrition requirement/status  ASSESSMENT:   44 y.o. male with medical history significant for polysubstance use disorder including injection drug use and bipolar disorder who presents to the ED for evaluation of abnormal blood cultures.  S/P shoulder surgery 9/10. Patient transferred to [facility] on 9/12. Plans for angio vac procedure with cardiothoracic surgery on 9/16. Remains on IV antibiotics.  Meal intakes have been poor, 25-100% meal completion with average intake 45% over the past 3 days. Patient is also receiving Ensure Enlive TID.  Labs reviewed. Na 131 Medications reviewed.  Diet Order:   Diet Order            Diet 2 gram sodium Room service appropriate? Yes; Fluid consistency: Thin; Fluid restriction: 1200 mL Fluid  Diet effective now                 EDUCATION NEEDS:   No education needs have been identified at this time  Skin:  Skin Assessment: Skin Integrity Issues: Skin Integrity Issues:: Incisions Incisions: closed incision to L shoulder  Last BM:  9/13  Height:   Ht Readings from Last 1 Encounters:  03/11/20 5\' 11"  (1.803 m)    Weight:   Wt Readings from Last 1 Encounters:  03/19/20 64 kg    BMI:  Body mass index is 19.68 kg/m.  Estimated Nutritional Needs:   Kcal:  1800-2000  Protein:  85-100g  Fluid:  2L/day    03/21/20, RD, LDN, CNSC Please refer to Amion for contact  information.

## 2020-03-19 NOTE — Progress Notes (Signed)
Occupational Therapy Treatment Patient Details Name: Robert Kramer MRN: [id number] DOB: [birth year] Today's Date: 03/19/2020    History of present illness 44 y.o. 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   OT comments  Patient admitted with the above diagnosis.  Left shoulder pain, unsteadiness and decreased balance are impacting overall ADL and functional mobility independence.  He is to undergo a procedure 9/16, OT to continue to follow in the acute setting.  Goal will be to maximize L upper extremity range and functional independence in the hospital setting.  Recommendations for post acute services remains undetermined.    Follow Up Recommendations    TBD   Equipment Recommendations       Recommendations for Other Services   TBD    Precautions / Restrictions Precautions Precautions: Fall Restrictions Weight Bearing Restrictions: No Other Position/Activity Restrictions: LUE WBAT, AROM as tolerated, able to lift arm                                                                       Praxis      Cognition Arousal/Alertness: Awake/alert Behavior During Therapy: WFL for tasks assessed/performed Overall Cognitive Status: Within Functional Limits for tasks assessed                                          Exercises General Exercises - Upper Extremity Shoulder Flexion: [name];Left;10  reps;Supine Shoulder Extension: [name];Left;10 reps;Supine Shoulder ABduction: [name];Left;10 reps;Supine Shoulder ADduction: [name];Left;10 reps;Supine Elbow Flexion: AROM;15 reps;Seated;Left Elbow Extension: AROM;15 reps;Left;Seated Wrist Flexion: AROM;15 reps;Left;Seated Wrist Extension: AROM;15 reps;Left;Seated Digit Composite Flexion: AROM;15 reps;Left Composite Extension: AROM;15 reps;Left                Pertinent Vitals/ Pain       Pain Score: 6  Pain Location: L shoulder Pain Descriptors / Indicators: Sore;Grimacing;Guarding Pain Intervention(s): Monitored during session;Repositioned                                                          Frequency  Min 2X/week        Progress Toward Goals  OT Goals(current goals can now be found in the care plan section)  Progress towards OT goals: Progressing toward goals  Acute Rehab OT Goals Patient Stated Goal: To be able to use my left arm again. OT Goal Formulation: With patient Time For Goal Achievement: 04/01/20 Potential to Achieve Goals: Good  Plan Frequency remains appropriate    Co-evaluation  AM-PAC OT "6 Clicks" Daily Activity     Outcome Measure   Help from another person eating meals?: None Help from another person taking care of personal grooming?: A Little Help from another person toileting, which includes using toliet, bedpan, or urinal?: A Little Help from another person bathing (including washing, rinsing, drying)?: A Lot Help from another person to put on and taking off regular upper body clothing?: A Lot Help from another person to put on and taking off regular lower body clothing?: A Lot 6 Click Score: 16    End of Session    OT Visit Diagnosis: Unsteadiness on feet (R26.81);Pain;Muscle weakness (generalized) (M62.81) Pain - Right/Left: Left Pain - part of body: Shoulder   Activity Tolerance Patient limited by pain   Patient Left in  bed;with call bell/phone within reach   Nurse Communication Other (comment) (n/a)        Time: 0737-1062 OT Time Calculation (min): 15 min  Charges: OT General Charges $OT Visit: 1 Visit OT Treatments $Therapeutic Exercise: 8-22 mins  03/19/2020  Rich, OTR/L  Acute Rehabilitation Services  Office:  [phone]    Suzanna Obey 03/19/2020, 11:36 AM

## 2020-03-19 NOTE — Progress Notes (Signed)
Physical Therapy Treatment Patient Details Name: Robert Kramer MRN: [id number] DOB: [birth year] Today's Date: 03/19/2020    History of Present Illness 44 y.o. 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    PT Comments    Pt asleep on entry, easily roused, but reluctant to work with therapy due to L shoulder pain. Pt educated to fact that he is able to use his L arm and bear weight through it, pt prefers not to use L UE. With increased encouragement pt agreeable to coming to EoB. Pt is limited during session by 10/10 pain in his L shoulder, and increasing generalized weakness. Pt requires min A for bed mobility, transfers and sidestepping to HoB. While sitting EoB pt able to perform limited LE exercises, before complaining of back and hip pain. Pt will likely not need additional PT services at discharge, however PT will continue to follow acutely and will reassess.     Follow Up Recommendations  No PT follow up     Equipment Recommendations  None recommended by PT       Precautions / Restrictions Precautions Precautions: Fall Restrictions Weight Bearing Restrictions: No Other Position/Activity Restrictions: LUE WBAT, AROM as tolerated, able to lift arm    Mobility  Bed Mobility Overal bed mobility: Needs Assistance Bed Mobility: Supine to Sit;Sit to Supine     Supine to sit: Min assist Sit to supine: Min guard   General bed mobility  comments: min A for management of LE off bed and for bringing trunk to upright, min guard and increased effort to bring LE back into bed   Transfers Overall transfer level: Needs assistance Equipment used: 1 person hand held assist Transfers: Sit to/from Stand Sit to Stand: Min assist         General transfer comment: min A for pt to rest L UE on therapist shoulder for stabilizing,  Ambulation/Gait Ambulation/Gait assistance: Min assist Gait Distance (Feet): 2 Feet Assistive device: 1 person hand held assist Gait Pattern/deviations: Step-to pattern;Shuffle;Antalgic Gait velocity: slowed Gait velocity interpretation: <1.31 ft/sec, indicative of household ambulator General Gait Details: minA for stabilizing L UE on therapist, only agreeable to lateral steps along bed for proper hip placement for return to bed         Balance Overall balance assessment: Needs assistance Sitting-balance support: No upper extremity supported;Feet supported Sitting balance-Leahy Scale: Fair     Standing balance support: Single extremity supported;During functional activity Standing balance-Leahy Scale: Fair                              Cognition Arousal/Alertness: Awake/alert Behavior During Therapy: WFL for tasks assessed/performed;Flat affect Overall Cognitive Status: Within Functional Limits for tasks assessed                                 General Comments: poor understanding of ability to use L UE despite prior washout  Exercises General Exercises - Lower Extremity Long Arc Quad: AROM;Both;10 reps;Seated Hip Flexion/Marching: AROM;Both;10 reps;Seated    General Comments General comments (skin integrity, edema, etc.): VSS on RA       Pertinent Vitals/Pain Pain Assessment: 0-10 Pain Score: 10-Worst pain ever Pain Location: L shoulder Pain Descriptors / Indicators: Sore;Grimacing;Guarding Pain Intervention(s): Limited activity within patient's  tolerance;Monitored during session;Repositioned           PT Goals (current goals can now be found in the care plan section) Acute Rehab PT Goals Patient Stated Goal: To be able to use my left arm again. PT Goal Formulation: With patient Time For Goal Achievement: 03/30/20 Potential to Achieve Goals: Good Progress towards PT goals: Not progressing toward goals - comment (limited by L shoulder pain )    Frequency    Min 3X/week      PT Plan Current plan remains appropriate       AM-PAC PT "6 Clicks" Mobility   Outcome Measure  Help needed turning from your back to your side while in a flat bed without using bedrails?: A Little Help needed moving from lying on your back to sitting on the side of a flat bed without using bedrails?: A Little Help needed moving to and from a bed to a chair (including a wheelchair)?: A Little Help needed standing up from a chair using your arms (e.g., wheelchair or bedside chair)?: A Little Help needed to walk in hospital room?: A Lot Help needed climbing 3-5 steps with a railing? : A Lot 6 Click Score: 16    End of Session   Activity Tolerance: Patient limited by pain Patient left: in bed;with call bell/phone within reach Nurse Communication: Mobility status PT Visit Diagnosis: Difficulty in walking, not elsewhere classified (R26.2);Other abnormalities of gait and mobility (R26.89)     Time: 1749-4496 PT Time Calculation (min) (ACUTE ONLY): 14 min  Charges:  $Therapeutic Activity: 8-22 mins                     Jensyn Cambria B. Beverely Risen PT, DPT Acute Rehabilitation Services Pager [phone] Office [phone]    [name] [facility] 03/19/2020, 3:42 PM

## 2020-03-19 NOTE — Progress Notes (Signed)
      [address]       [name] [id number]             [phone]       Feels okay this morning. I asked him if he had any questions about the surgery on Thursday and he stated he didn't. He is on the schedule for Angiovac Thursday.   Jari Favre, PA-C

## 2020-03-19 NOTE — Progress Notes (Addendum)
PROGRESS NOTE    Robert KluverJames E Dehnert  ZOX:[id number] DOB: 1976/01/07 DOA: 03/11/2020 PCP: Patient, No Pcp Per     Brief Narrative:  Guy SandiferJames E Frithis a 44 y.o.malewith medical history significant forpolysubstance use disorder including injection drug use and bipolar disorder who presents to the ED for evaluation ofabnormal blood cultures. Patient was seen in the [facility] ED 03/06/2020 for evaluation and management of heroin withdrawal. He was treated supportively. Blood cultures were obtained and have now grown out MRSA and Serratia marcescens. He was called today to come to the ED for further evaluation. CTA chest PE study shows multiple cavitary and noncavitary nodules consistent with septic emboli, bilateral lower lobe pneumonia, mild bilateral hilar adenopathy felt to be reactive. No plan for pulmonary emboli reported.  He was started on IV vancomycin and ceftriaxone. TTE was performed on 03/12/2020. It has demonstratred "shaggy" vegetations on the tricuspid valve. TEE 9/10 revealed 2 x 2cm long vegetations on the tricuspid valve with resultant severe TR. MRI of left shoulder demonstrated septic arthropathy, myositis, and tenosynovitis. He underwent wash out of the left shoulder by orthopedic surgery on 9/10.  He was then transferred to [facility] on 9/12 for further evaluation of his endocarditis by cardiothoracic surgery.   New events last 24 hours / Subjective: He is scheduled for angiovac with cardiothoracic surgery on 9/16. Patient had no physical complaints this morning. He had fevers documented yesterday afternoon T-max 101  Assessment & Plan:   Principal Problem:   Severe sepsis due to methicillin resistant Staphylococcus aureus (MRSA) with acute organ dysfunction (HCC) Active Problems:   Polysubstance dependence (HCC)   Hypokalemia   Thrombocytopenia (HCC)   Serratia septicemia (HCC)   Bipolar 1 disorder (HCC)   Heroin abuse (HCC)   Arthritis, septic, shoulder (HCC)    Endocarditis of tricuspid valve   Hepatitis C virus infection   IVDU (intravenous drug user)   Normocytic anemia   Cigarette smoker   Septic pulmonary embolism (HCC)   Severe sepsis secondary to MRSA and Serratia bacteremia, pulmonary septic emboli, TV endocarditis, left shoulder septic arthritis. POA  -S/p left shoulder arthroscopy with debridement irrigation, for septic arthritis with orthopedics by Dr. Dion SaucierLandau 9/10 -Echocardiogram showed tricuspid vegetation -TEE showed 2 x 2 cm mobile vegetations with severe TR -MRI lumbar spine performed shows no evidence of epidural abscess -Patient refused MRI right shoulder ordered by ID -Cardiothoracic surgery evaluating patient for angio vac debridement, tentatively scheduled for 9/16 -Infectious disease following -Repeat blood culture drawn on 9/8 negative  -Continue vancomycin, Rocephin  Hyponatremia -Improving. BMP pending this morning  Anemia of chronic disease -Hemoglobin has been stable  Thrombocytopenia -Improving  Heroin use disorder -Continue Suboxone  History of bipolar disorder, depression, anxiety -Not on outpatient medical therapy   DVT prophylaxis:  SCDs Start: 03/11/20 2055  Code Status: Full Family Communication: None at bedside Disposition Plan:   Status is: Inpatient  Remains inpatient appropriate because:Inpatient level of care appropriate due to severity of illness   Dispo: The patient is from: Home              Anticipated d/c is to: Home              Anticipated d/c date is: > 3 days              Patient currently is not medically stable to d/c.  Remains on IV antibiotics.  Undergoing evaluation by cardiothoracic surgery for angio vac procedure, tentatively scheduled for 9/16  Antimicrobials:  Anti-infectives (From admission, onward)   Start     Dose/Rate Route Frequency Ordered Stop   03/18/20 1400  cefTRIAXone (ROCEPHIN) 2 g in sodium chloride 0.9 % 100 mL IVPB        2 g 200 mL/hr over 30  Minutes Intravenous Every 24 hours 03/18/20 1105     03/12/20 2000  cefTRIAXone (ROCEPHIN) 2 g in sodium chloride 0.9 % 100 mL IVPB  Status:  Discontinued        2 g 200 mL/hr over 30 Minutes Intravenous Every 24 hours 03/11/20 2058 03/12/20 0937   03/12/20 1400  ceFEPIme (MAXIPIME) 2 g in sodium chloride 0.9 % 100 mL IVPB  Status:  Discontinued        2 g 200 mL/hr over 30 Minutes Intravenous Every 8 hours 03/12/20 0937 03/18/20 1105   03/12/20 0000  vancomycin (VANCOCIN) IVPB 1000 mg/200 mL premix        1,000 mg 200 mL/hr over 60 Minutes Intravenous Every 8 hours 03/11/20 1816     03/11/20 1830  cefTRIAXone (ROCEPHIN) 2 g in sodium chloride 0.9 % 100 mL IVPB        2 g 200 mL/hr over 30 Minutes Intravenous  Once 03/11/20 1822 03/11/20 2030   03/11/20 1545  vancomycin (VANCOREADY) IVPB 1500 mg/300 mL        1,500 mg 150 mL/hr over 120 Minutes Intravenous  Once 03/11/20 1532 03/11/20 1819       Objective: Vitals:   03/19/20 0012 03/19/20 0539 03/19/20 0543 03/19/20 1018  BP: 100/66 99/72  107/62  Pulse: 86 98  (!) 101  Resp: 19   18  Temp: 97.6 F (36.4 C) 98.1 F (36.7 C)  98.8 F (37.1 C)  TempSrc: Oral Oral  Oral  SpO2: 96% 95%    Weight:   64 kg   Height:        Intake/Output Summary (Last 24 hours) at 03/19/2020 1031 Last data filed at 03/19/2020 1019 Gross per 24 hour  Intake 1640 ml  Output 1200 ml  Net 440 ml   Filed Weights   03/11/20 1521 03/18/20 0500 03/19/20 0543  Weight: 70.8 kg 64 kg 64 kg    Examination: General exam: Appears calm and comfortable  Respiratory system: Clear to auscultation. Respiratory effort normal. Cardiovascular system: S1 & S2 heard, RRR. No pedal edema. Gastrointestinal system: Abdomen is nondistended, soft and nontender. Normal bowel sounds heard. Central nervous system: Alert and oriented. Non focal exam. Speech clear  Extremities: Symmetric in appearance bilaterally  Skin: No rashes, lesions or ulcers on exposed skin    Psychiatry: Judgement and insight appear stable. Mood & affect appropriate.    Data Reviewed: I have personally reviewed following labs and imaging studies  CBC: Recent Labs  Lab 03/14/20 0503 03/16/20 0611 03/16/20 1244 03/17/20 0639 03/18/20 0335  WBC 15.6* 9.6 12.5* 11.8* 11.8*  NEUTROABS 12.2* 7.5 9.9* 9.8* 9.1*  HGB 8.1* 7.2* 7.7* 8.0* 7.3*  HCT 24.5* 21.6* 22.5* 25.4* 23.5*  MCV 85.7 84.0 81.2 88.8 86.4  PLT 47* 61* 78* 98* 112*   Basic Metabolic Panel: Recent Labs  Lab 03/16/20 0611 03/16/20 1244 03/17/20 0639 03/17/20 1058 03/18/20 0335  NA 130* 128* 126* 129* 130*  K 5.4* 5.1 4.1 5.5* 4.1  CL 97* 97* 94* 96* 97*  CO2 GLUCOSE 134* 100* 106* 125* 124*  BUN 21* CREATININE 0.99 0.54* 0.54* 0.59* 0.66  CALCIUM 8.1* 7.9* 7.9* 8.0* 7.9*  MG  --   --  1.8  --  1.8   GFR: Estimated Creatinine Clearance: 104.4 mL/min (by C-G formula based on SCr of 0.66 mg/dL). Liver Function Tests: Recent Labs  Lab 03/17/20 0639  AST 32  ALT 27  ALKPHOS 50  BILITOT 0.9  PROT 6.3*  ALBUMIN 1.3*   No results for input(s): LIPASE, AMYLASE in the last 168 hours. No results for input(s): AMMONIA in the last 168 hours. Coagulation Profile: No results for input(s): INR, PROTIME in the last 168 hours. Cardiac Enzymes: No results for input(s): CKTOTAL, CKMB, CKMBINDEX, TROPONINI in the last 168 hours. BNP (last 3 results) No results for input(s): PROBNP in the last 8760 hours. HbA1C: No results for input(s): HGBA1C in the last 72 hours. CBG: No results for input(s): GLUCAP in the last 168 hours. Lipid Profile: No results for input(s): CHOL, HDL, LDLCALC, TRIG, CHOLHDL, LDLDIRECT in the last 72 hours. Thyroid Function Tests: No results for input(s): TSH, T4TOTAL, FREET4, T3FREE, THYROIDAB in the last 72 hours. Anemia Panel: No results for input(s): VITAMINB12, FOLATE, FERRITIN, TIBC, IRON, RETICCTPCT in the last 72 hours. Sepsis Labs: No  results for input(s): PROCALCITON, LATICACIDVEN in the last 168 hours.  Recent Results (from the past 240 hour(s))  Urine culture     Status: Abnormal   Collection Time: 03/11/20  3:28 PM   Specimen: In/Out Cath Urine  Result Value Ref Range Status   Specimen Description   Final    IN/OUT CATH URINE Performed at [facility], [address], [place], [place] [address]    Special Requests   Final    NONE Performed at [facility], [address], [place], [place] [address]    Culture MULTIPLE SPECIES PRESENT, SUGGEST RECOLLECTION (A)  Final   Report Status 03/13/2020 FINAL  Final  Blood culture (routine x 2)     Status: Abnormal   Collection Time: 03/11/20  3:31 PM   Specimen: BLOOD  Result Value Ref Range Status   Specimen Description   Final    BLOOD LEFT ARM Performed at [facility], [address], [place], [place] [address]    Special Requests   Final    BOTTLES DRAWN AEROBIC AND ANAEROBIC Blood Culture results may not be optimal due to an excessive volume of blood received in culture bottles Performed at [facility], [address], [place], [place] [address]    Culture  Setup Time   Final    GRAM POSITIVE COCCI IN CLUSTERS IN BOTH AEROBIC AND ANAEROBIC BOTTLES CRITICAL VALUE NOTED.  VALUE IS CONSISTENT WITH PREVIOUSLY REPORTED AND CALLED VALUE.    Culture (A)  Final    STAPHYLOCOCCUS AUREUS SUSCEPTIBILITIES PERFORMED ON PREVIOUS CULTURE WITHIN THE LAST 5 DAYS. Performed at [facility], [address] [place], [place] [address]    Report Status 03/14/2020 FINAL  Final  SARS Coronavirus 2 by RT PCR (hospital order, performed in [facility] hospital lab) Nasopharyngeal Nasopharyngeal Swab     Status: None   Collection Time: 03/11/20  5:05 PM   Specimen: Nasopharyngeal Swab  Result Value Ref Range Status   SARS Coronavirus 2 NEGATIVE NEGATIVE Final    Comment: (NOTE) SARS-CoV-2 target  nucleic acids are NOT DETECTED.  The SARS-CoV-2 RNA is generally detectable in upper and lower respiratory specimens during the acute phase of infection. The lowest concentration of SARS-CoV-2 viral copies this assay can  detect is 250 copies / mL. A negative result does not preclude SARS-CoV-2 infection and should not be used as the sole basis for treatment or other patient management decisions.  A negative result may occur with improper specimen collection / handling, submission of specimen other than nasopharyngeal swab, presence of viral mutation(s) within the areas targeted by this assay, and inadequate number of viral copies (<250 copies / mL). A negative result must be combined with clinical observations, patient history, and epidemiological information.  Fact Sheet for Patients:   BoilerBrush.com.cy  Fact Sheet for Healthcare Providers: https://pope.com/  This test is not yet approved or  cleared by the [place] FDA and has been authorized for detection and/or diagnosis of SARS-CoV-2 by FDA under an Emergency Use Authorization (EUA).  This EUA will remain in effect (meaning this test can be used) for the duration of the COVID-19 declaration under Section 564(b)(1) of the Act, 21 U.S.C. section 360bbb-3(b)(1), unless the authorization is terminated or revoked sooner.  Performed at [facility], [address] [place], [place] [address]   Culture, blood (Routine X 2) w Reflex to ID Panel     Status: None (Preliminary result)   Collection Time: 03/13/20 11:09 AM   Specimen: BLOOD  Result Value Ref Range Status   Specimen Description   Final    BLOOD RIGHT ANTECUBITAL Performed at [facility], [address], [place], [place] [address]    Special Requests   Final    BOTTLES DRAWN AEROBIC AND ANAEROBIC Blood Culture adequate volume Performed at [facility], [address] [place], [place] [address]    Culture   Final    NO GROWTH 4 DAYS Performed at [facility], [address] [place], [place] [address]    Report Status PENDING  Incomplete  Culture, blood (Routine X 2) w Reflex to ID Panel     Status: None (Preliminary result)   Collection Time: 03/13/20 11:10 AM   Specimen: BLOOD RIGHT HAND  Result Value Ref Range Status   Specimen Description   Final    BLOOD RIGHT HAND Performed at [facility], [address] [place], [place] [address]    Special Requests   Final    BOTTLES DRAWN AEROBIC ONLY Blood Culture results may not be optimal due to an inadequate volume of blood received in culture bottles Performed at [facility], [address], [place], [place] [address]    Culture   Final    NO GROWTH 4 DAYS Performed at [facility], [address], [place], [place] [address]    Report Status PENDING  Incomplete  Surgical pcr screen     Status: None   Collection Time: 03/15/20 11:56 AM   Specimen: Nasal Mucosa; Nasal Swab  Result Value Ref Range Status   MRSA, PCR NEGATIVE NEGATIVE Final   Staphylococcus aureus NEGATIVE NEGATIVE Final    Comment: (NOTE) The Xpert SA Assay (FDA approved for NASAL specimens in patients 106 years of age and older), is one component of a comprehensive surveillance program. It is not intended to diagnose infection nor to guide or monitor treatment. Performed at [facility], [address], [place], [place] [address]   Aerobic/Anaerobic Culture (surgical/deep wound)     Status: None (Preliminary result)   Collection Time: 03/15/20  2:07 PM   Specimen: Synovial, Left Shoulder; Body Fluid  Result Value Ref Range Status   Specimen  Description FLUID SYNOVIAL LEFT SHOULDER  Final   Special Requests NONE  Final   Gram Stain   Final    ABUNDANT WBC PRESENT, PREDOMINANTLY PMN RARE GRAM POSITIVE COCCI Performed at [facility], [address] [place], [place] [address]    Culture   Final    RARE METHICILLIN RESISTANT STAPHYLOCOCCUS AUREUS NO ANAEROBES ISOLATED; CULTURE IN PROGRESS FOR 5 DAYS    Report Status PENDING  Incomplete   Organism ID, Bacteria METHICILLIN RESISTANT STAPHYLOCOCCUS AUREUS  Final      Susceptibility   Methicillin resistant staphylococcus aureus - MIC*    CIPROFLOXACIN <=0.5 SENSITIVE Sensitive     ERYTHROMYCIN >=8 RESISTANT Resistant     GENTAMICIN <=0.5 SENSITIVE Sensitive     OXACILLIN >=4 RESISTANT Resistant     TETRACYCLINE <=1 SENSITIVE Sensitive     VANCOMYCIN 1 SENSITIVE Sensitive     TRIMETH/SULFA <=10 SENSITIVE Sensitive     CLINDAMYCIN <=0.25 SENSITIVE Sensitive     RIFAMPIN <=0.5 SENSITIVE Sensitive     Inducible Clindamycin NEGATIVE Sensitive     * RARE METHICILLIN RESISTANT STAPHYLOCOCCUS AUREUS      Radiology Studies: No results found.    Scheduled Meds: . buprenorphine-naloxone  1 tablet Sublingual BID  . feeding supplement (ENSURE ENLIVE)  237 mL Oral TID BM  . multivitamin with minerals  1 tablet Oral Daily  . sodium chloride flush  3 mL Intravenous Q12H   Continuous Infusions: . cefTRIAXone (ROCEPHIN)  IV 2 g (03/18/20 1553)  . vancomycin 1,000 mg (03/19/20 0112)     LOS: 8 days      Time spent:   Noralee Stain, DO [facility] 03/19/2020, 10:31 AM   Available via Epic secure chat 7am-7pm After these hours, please refer to coverage provider listed on [email or web address]

## 2020-03-19 NOTE — Progress Notes (Signed)
Peripherally Inserted Central Catheter Placement  The IV Nurse has discussed with the patient and/or persons authorized to consent for the patient, the purpose of this procedure and the potential benefits and risks involved with this procedure.  The benefits include less needle sticks, lab draws from the catheter, and the patient may be discharged home with the catheter. Risks include, but not limited to, infection, bleeding, blood clot (thrombus formation), and puncture of an artery; nerve damage and irregular heartbeat and possibility to perform a PICC exchange if needed/ordered by physician.  Alternatives to this procedure were also discussed.  Bard Power PICC patient education guide, fact sheet on infection prevention and patient information card has been provided to patient /or left at bedside.    PICC Placement Documentation  PICC Single Lumen 03/19/20 PICC Right Brachial 42 cm 0 cm (Active)  Indication for Insertion or Continuance of Line Prolonged intravenous therapies 03/19/20 1420  Exposed Catheter (cm) 0 cm 03/19/20 1420  Site Assessment Clean;Dry;Intact 03/19/20 1420  Line Status Flushed;Blood return noted 03/19/20 1420  Dressing Type Transparent 03/19/20 1420  Dressing Status Clean;Dry;Intact;Antimicrobial disc in place;Other (Comment) 03/19/20 1420  Dressing Intervention New dressing 03/19/20 1420  Dressing Change Due 03/26/20 03/19/20 1420       Reginia Forts Albarece 03/19/2020, 2:22 PM

## 2020-03-20 DIAGNOSIS — L8990 Pressure ulcer of unspecified site, unspecified stage: Secondary | ICD-10-CM | POA: Insufficient documentation

## 2020-03-20 DIAGNOSIS — B9562 Methicillin resistant Staphylococcus aureus infection as the cause of diseases classified elsewhere: Secondary | ICD-10-CM

## 2020-03-20 DIAGNOSIS — M545 Low back pain, unspecified: Secondary | ICD-10-CM

## 2020-03-20 DIAGNOSIS — I079 Rheumatic tricuspid valve disease, unspecified: Secondary | ICD-10-CM

## 2020-03-20 DIAGNOSIS — D696 Thrombocytopenia, unspecified: Secondary | ICD-10-CM

## 2020-03-20 DIAGNOSIS — I2690 Septic pulmonary embolism without acute cor pulmonale: Secondary | ICD-10-CM

## 2020-03-20 DIAGNOSIS — R7881 Bacteremia: Secondary | ICD-10-CM

## 2020-03-20 DIAGNOSIS — A4153 Sepsis due to Serratia: Principal | ICD-10-CM

## 2020-03-20 DIAGNOSIS — D649 Anemia, unspecified: Secondary | ICD-10-CM

## 2020-03-20 LAB — BASIC METABOLIC PANEL
Anion gap: 4 — ABNORMAL LOW (ref 5–15)
BUN: 12 mg/dL (ref 6–20)
CO2: 29 mmol/L (ref 22–32)
Calcium: 8.1 mg/dL — ABNORMAL LOW (ref 8.9–10.3)
Chloride: 97 mmol/L — ABNORMAL LOW (ref 98–111)
Creatinine, Ser: 0.79 mg/dL (ref 0.61–1.24)
GFR calc Af Amer: >60 mL/min (ref >60)
GFR calc non Af Amer: >60 mL/min (ref >60)
Glucose, Bld: 109 mg/dL — ABNORMAL HIGH (ref 70–99)
Potassium: 4.1 mmol/L (ref 3.5–5.1)
Sodium: 130 mmol/L — ABNORMAL LOW (ref 135–145)

## 2020-03-20 LAB — CULTURE, BLOOD (ROUTINE X 2)
Culture: NO GROWTH
Culture: NO GROWTH
Special Requests: ADEQUATE

## 2020-03-20 LAB — HCV RNA QUANT
HCV Quantitative Log: 6.744 log10 IU/mL (ref >1.70)
HCV Quantitative: 5550000 IU/mL (ref >50)

## 2020-03-20 LAB — SODIUM, URINE, RANDOM: Sodium, Ur: 103 mmol/L

## 2020-03-20 LAB — AEROBIC/ANAEROBIC CULTURE W GRAM STAIN (SURGICAL/DEEP WOUND)

## 2020-03-20 LAB — RETICULOCYTES
Immature Retic Fract: 27.8 % — ABNORMAL HIGH (ref 2.3–15.9)
RBC.: 2.77 MIL/uL — ABNORMAL LOW (ref 4.22–5.81)
Retic Count, Absolute: 168.7 10*3/uL (ref 19.0–186.0)
Retic Ct Pct: 6.1 % — ABNORMAL HIGH (ref 0.4–3.1)

## 2020-03-20 LAB — CBC
HCT: 23.9 % — ABNORMAL LOW (ref 39.0–52.0)
Hemoglobin: 7.4 g/dL — ABNORMAL LOW (ref 13.0–17.0)
MCH: 27.8 pg (ref 26.0–34.0)
MCHC: 31 g/dL (ref 30.0–36.0)
MCV: 89.8 fL (ref 80.0–100.0)
Platelets: 146 10*3/uL — ABNORMAL LOW (ref 150–400)
RBC: 2.66 MIL/uL — ABNORMAL LOW (ref 4.22–5.81)
RDW: 16.4 % — ABNORMAL HIGH (ref 11.5–15.5)
WBC: 11.8 10*3/uL — ABNORMAL HIGH (ref 4.0–10.5)
nRBC: 0 % (ref 0.0–0.2)

## 2020-03-20 LAB — IRON AND TIBC
Iron: 25 ug/dL — ABNORMAL LOW (ref 45–182)
Saturation Ratios: 13 % — ABNORMAL LOW (ref 17.9–39.5)
TIBC: 195 ug/dL — ABNORMAL LOW (ref 250–450)
UIBC: 170 ug/dL

## 2020-03-20 LAB — FERRITIN: Ferritin: 453 ng/mL — ABNORMAL HIGH (ref 24–336)

## 2020-03-20 LAB — PREPARE RBC (CROSSMATCH)

## 2020-03-20 LAB — VITAMIN B12: Vitamin B-12: 440 pg/mL (ref 180–914)

## 2020-03-20 LAB — FOLATE: Folate: 15.4 ng/mL (ref >5.9)

## 2020-03-20 MED ORDER — SODIUM CHLORIDE 0.9 % IV SOLN
2.0000 g | Freq: Three times a day (TID) | INTRAVENOUS | Status: DC
  Administered 2020-03-20 – 2020-04-22 (×99): 2 g via INTRAVENOUS
  Filled 2020-03-20 (×100): qty 2

## 2020-03-20 NOTE — Progress Notes (Signed)
Physical Therapy Treatment Patient Details Name: Robert Kramer MRN: [id number] DOB: 08-04-1975 Today's Date: 03/20/2020    History of Present Illness 44 y.o. 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    PT Comments    Pt is making good progress toward his goals. Much less painful today improving his ability to participate. Pt continues to be limited by R UE pain and weakness in addition to global generalized weakness. Pt is min guard for bed mobility and transfers requiring increased effort. Pt ambulates today with light min A for steadying. D/c plans remain appropriate at this time. PT will continue to see acutely.    Follow Up Recommendations  No PT follow up     Equipment Recommendations  None recommended by PT       Precautions / Restrictions Precautions Precautions: Fall Restrictions Other Position/Activity Restrictions: LUE WBAT, AROM as tolerated, able to lift arm    Mobility  Bed Mobility Overal bed mobility: Needs Assistance Bed Mobility: Supine to Sit;Sit to Supine     Supine to sit: Min guard Sit to supine: Min guard   General bed mobility comments: increased time, effort and use of bedrail   Transfers Overall transfer level: Needs assistance Equipment used: 1 person hand held assist Transfers: Sit to/from Stand Sit to Stand: Min guard         General transfer comment: increased effort to come to upright and  steady in RW  Ambulation/Gait Ambulation/Gait assistance: Min assist Gait Distance (Feet): 20 Feet Assistive device: Rolling walker (2 wheeled) Gait Pattern/deviations: Step-to pattern;Shuffle;Antalgic Gait velocity: slowed Gait velocity interpretation: <1.31 ft/sec, indicative of household ambulator General Gait Details: min A for steadying, vc for proximity to RW and upright posture       Balance Overall balance assessment: Needs assistance Sitting-balance support: No upper extremity supported;Feet supported Sitting balance-Leahy Scale: Fair     Standing balance support: Single extremity supported;During functional activity Standing balance-Leahy Scale: Fair                              Cognition Arousal/Alertness: Awake/alert Behavior During Therapy: WFL for tasks assessed/performed;Flat affect Overall Cognitive Status: Within Functional Limits for tasks assessed                                 General Comments: poor understanding of ability to use L UE despite prior washout       Exercises General Exercises - Upper Extremity Elbow Extension: AROM;Left;Seated;10 reps Wrist Flexion: AROM;Left;Seated;10 reps Wrist Extension: AROM;Left;Seated;10 reps Digit Composite Flexion: AROM;Left;10 reps Composite Extension: AROM;Left;10 reps    General Comments        Pertinent Vitals/Pain Pain Assessment: Faces Faces Pain Scale: Hurts a little bit Pain Location: L shoulder Pain Descriptors / Indicators: Sore;Grimacing;Guarding Pain Intervention(s): Limited activity within patient's tolerance;Monitored during session;Repositioned           PT Goals (current  goals can now be found in the care plan section) Acute Rehab PT Goals Patient Stated Goal: To be able to use my left arm again. PT Goal Formulation: With patient Time For Goal Achievement: 03/30/20 Potential to Achieve Goals: Good Progress towards PT goals: Progressing toward goals     Frequency    Min 3X/week      PT Plan Current plan remains appropriate       AM-PAC PT "6 Clicks" Mobility   Outcome Measure  Help needed turning from your back to your side while in a flat bed without using bedrails?: A Little Help needed moving from lying on your back to sitting on the side of a flat bed without using bedrails?: A Little Help needed moving to and from a bed to a chair (including a wheelchair)?: A Little Help needed standing up from a chair using your arms (e.g., wheelchair or bedside chair)?: A Little Help needed to walk in hospital room?: A Lot Help needed climbing 3-5 steps with a railing? : A Lot 6 Click Score: 16    End of Session   Activity Tolerance: Patient limited by pain Patient left: in bed;with call bell/phone within reach Nurse Communication: Mobility status PT Visit Diagnosis: Difficulty in walking, not elsewhere classified (R26.2);Other abnormalities of gait and mobility (R26.89)     Time: 2633-3545 PT Time Calculation (min) (ACUTE ONLY): 18 min  Charges:  $Gait Training: 8-22 mins                     Robert Kramer B. Beverely Risen PT, DPT Acute Rehabilitation Services Pager [phone] Office [phone]    Robert Kramer [facility] 03/20/2020, 6:14 PM

## 2020-03-20 NOTE — Progress Notes (Signed)
[facility]  Date of Admission:  03/11/2020   Total days of antibiotics 9                                                                         Vancomycine  Day 9                                                                         Ceftriaxone      Day 2                                                                         Cefepime         Day 1  ASSESSMENT: Robert Kramer has MRSA and Serratia bacteremia secondary to IV drug use, complicated by tricuspid valve endocarditis, septic pulmonary emboli, left shoulder septic arthritis and early lumbar infection.   Patient appears clinically well today with improved lumbar pain.  Patient is schedule for angio [facility] tomorrow.  Continue vancomycin for MRSA bacteremia.  Switch from ceftriaxone to cefepime to avoid potential Serratia AmpC beta lactamase induction.  The duration of his antibiotic therapy is 6 weeks.  PICC line was placed yesterday  PLAN: -Continue vancomycin -Switch to cefepime -Plan angioVAC tomorrow -Pending HCV RNA quant result   Principal Problem:   Severe sepsis due to methicillin resistant Staphylococcus aureus (MRSA) with acute organ dysfunction (HCC) Active Problems:   Serratia septicemia (HCC)   Arthritis, septic, shoulder (HCC)   Endocarditis of tricuspid valve   Hepatitis C virus infection   Lumbar back pain   Polysubstance dependence (HCC)   Hypokalemia   Thrombocytopenia (HCC)   Bipolar 1 disorder (HCC)   Heroin abuse (HCC)   IVDU (intravenous drug user)   Normocytic anemia   Cigarette smoker   Septic pulmonary embolism (HCC)   Pressure injury of skin   Scheduled Meds: . buprenorphine-naloxone  1 tablet Sublingual BID  . Chlorhexidine Gluconate Cloth  6 each Topical Daily  . feeding supplement (ENSURE ENLIVE)  237 mL Oral TID BM  . multivitamin with minerals  1 tablet Oral Daily  . sodium chloride flush  10-40 mL Intracatheter Q12H   Continuous Infusions: .  ceFEPime (MAXIPIME) IV    . vancomycin 1,000 mg (03/20/20 1027)   PRN Meds:.acetaminophen **OR** acetaminophen, hydrOXYzine, ketorolac, loperamide, methocarbamol, ondansetron **OR** ondansetron (ZOFRAN) IV, sodium chloride flush   SUBJECTIVE: Patient is seen at bedside today.  Appears comfortable.  Patient states that he is feeling better with no acute complaints.  His lumbar pain is also improved.  Review of Systems: Review of Systems  Constitutional: Negative for chills and fever.  HENT: Negative.  Eyes: Negative.   Respiratory: Negative.   Cardiovascular: Negative.   Musculoskeletal: Positive for back pain (improved) and joint pain (Stiff left shoulder joint).    Allergies  Allergen Reactions  . Tramadol Other (See Comments)    Upset stomach    OBJECTIVE: Vitals:   03/19/20 1420 03/19/20 1632 03/19/20 2019 03/20/20 0500  BP: (!) 159/93 111/74 109/81 107/78  Pulse:  98 100 (!) 109  Resp:  18 18 18   Temp:  98.5 F (36.9 C) 98.2 F (36.8 C) 99.3 F (37.4 C)  TempSrc:  Oral Oral Oral  SpO2:  98% 98%   Weight:      Height:       Body mass index is 19.68 kg/m.  Physical Exam Constitutional:      General: He is not in acute distress. HENT:     Head: Normocephalic.  Eyes:     General: No scleral icterus.       Right eye: No discharge.        Left eye: No discharge.  Cardiovascular:     Rate and Rhythm: Regular rhythm. Tachycardia present.     Heart sounds: No murmur heard.   Pulmonary:     Breath sounds: Normal breath sounds.  Abdominal:     General: Bowel sounds are normal.  Musculoskeletal:     Cervical back: Normal range of motion.     Right lower leg: No edema.     Left lower leg: No edema.     Comments: No tender to palpation of lumbar region  Skin:    General: Skin is warm.     Coloration: Skin is not jaundiced.  Neurological:     Mental Status: He is alert.  Psychiatric:        Mood and Affect: Mood normal.     Lab Results Lab Results    Component Value Date   WBC 11.8 (H) 03/20/2020   HGB 7.4 (L) 03/20/2020   HCT 23.9 (L) 03/20/2020   MCV 89.8 03/20/2020   PLT 146 (L) 03/20/2020    Lab Results  Component Value Date   CREATININE 0.79 03/20/2020   BUN 12 03/20/2020   NA 130 (L) 03/20/2020   K 4.1 03/20/2020   CL 97 (L) 03/20/2020   CO2 29 03/20/2020    Lab Results  Component Value Date   ALT 27 03/17/2020   AST 32 03/17/2020   ALKPHOS 50 03/17/2020   BILITOT 0.9 03/17/2020     Microbiology: Recent Results (from the past 240 hour(s))  Urine culture     Status: Abnormal   Collection Time: 03/11/20  3:28 PM   Specimen: In/Out Cath Urine  Result Value Ref Range Status   Specimen Description   Final    IN/OUT CATH URINE Performed at [facility], [address], [place], [place]    Special Requests   Final    NONE Performed at [facility], [address], [place], [place]    Culture MULTIPLE SPECIES PRESENT, SUGGEST RECOLLECTION (A)  Final   Report Status 03/13/2020 FINAL  Final  Blood culture (routine x 2)     Status: Abnormal   Collection Time: 03/11/20  3:31 PM   Specimen: BLOOD  Result Value Ref Range Status   Specimen Description   Final    BLOOD LEFT ARM Performed at [facility], [address] [place], [place]    Special Requests   Final  BOTTLES DRAWN AEROBIC AND ANAEROBIC Blood Culture results may not be optimal due to an excessive volume of blood received in culture bottles Performed at [facility], [address] [place], [place] [address]    Culture  Setup Time   Final    GRAM POSITIVE COCCI IN CLUSTERS IN BOTH AEROBIC AND ANAEROBIC BOTTLES CRITICAL VALUE NOTED.  VALUE IS CONSISTENT WITH PREVIOUSLY REPORTED AND CALLED VALUE.    Culture (A)  Final    STAPHYLOCOCCUS AUREUS SUSCEPTIBILITIES PERFORMED ON PREVIOUS CULTURE WITHIN THE LAST 5 DAYS. Performed at [facility], [address] [place], [place] [address]    Report Status 03/14/2020 FINAL  Final  SARS Coronavirus 2 by RT PCR (hospital order, performed in [facility] hospital lab) Nasopharyngeal Nasopharyngeal Swab     Status: None   Collection Time: 03/11/20  5:05 PM   Specimen: Nasopharyngeal Swab  Result Value Ref Range Status   SARS Coronavirus 2 NEGATIVE NEGATIVE Final    Comment: (NOTE) SARS-CoV-2 target nucleic acids are NOT DETECTED.  The SARS-CoV-2 RNA is generally detectable in upper and lower respiratory specimens during the acute phase of infection. The lowest concentration of SARS-CoV-2 viral copies this assay can detect is 250 copies / mL. A negative result does not preclude SARS-CoV-2 infection and should not be used as the sole basis for treatment or other patient management decisions.  A negative result may occur with improper specimen collection / handling, submission of specimen other than nasopharyngeal swab, presence of viral mutation(s) within the areas targeted by this assay, and inadequate number of viral copies (<250 copies / mL). A negative result must be combined with clinical observations, patient history, and epidemiological information.  Fact Sheet for Patients:   BoilerBrush.com.cy  Fact Sheet for Healthcare Providers: https://pope.com/  This test is not yet approved or  cleared by the [place] FDA and has been authorized for detection and/or diagnosis of SARS-CoV-2 by FDA under an Emergency Use Authorization (EUA).  This EUA will remain in effect (meaning this test can be used) for the duration of the COVID-19 declaration under Section 564(b)(1) of the Act, 21 U.S.C. section 360bbb-3(b)(1), unless the authorization is terminated or revoked sooner.  Performed at [facility], [address] [place], [place] [address]   Culture, blood (Routine X 2) w Reflex to ID Panel      Status: None (Preliminary result)   Collection Time: 03/13/20 11:09 AM   Specimen: BLOOD  Result Value Ref Range Status   Specimen Description   Final    BLOOD RIGHT ANTECUBITAL Performed at [facility], [address], [place], [place] [address]    Special Requests   Final    BOTTLES DRAWN AEROBIC AND ANAEROBIC Blood Culture adequate volume Performed at [facility], [address], [place], [place] [address]    Culture   Final    NO GROWTH 4 DAYS Performed at [facility], [address], [place], [place] [address]    Report Status PENDING  Incomplete  Culture, blood (Routine X 2) w Reflex to ID Panel     Status: None (Preliminary result)   Collection Time: 03/13/20 11:10 AM   Specimen: BLOOD RIGHT HAND  Result Value Ref Range Status   Specimen Description   Final    BLOOD RIGHT HAND Performed at [facility], [address] [place], [place] [address]    Special Requests   Final  BOTTLES DRAWN AEROBIC ONLY Blood Culture results may not be optimal due to an inadequate volume of blood received in culture bottles Performed at [facility], [address] [place], [place] [address]    Culture   Final    NO GROWTH 4 DAYS Performed at [facility], [address] [place], [place] [address]    Report Status PENDING  Incomplete  Surgical pcr screen     Status: None   Collection Time: 03/15/20 11:56 AM   Specimen: Nasal Mucosa; Nasal Swab  Result Value Ref Range Status   MRSA, PCR NEGATIVE NEGATIVE Final   Staphylococcus aureus NEGATIVE NEGATIVE Final    Comment: (NOTE) The Xpert SA Assay (FDA approved for NASAL specimens in patients 46 years of age and older), is one component of a comprehensive surveillance program. It is not intended to diagnose infection nor to guide or monitor treatment. Performed at [facility], [address] [place], [place] [address]    Aerobic/Anaerobic Culture (surgical/deep wound)     Status: None (Preliminary result)   Collection Time: 03/15/20  2:07 PM   Specimen: Synovial, Left Shoulder; Body Fluid  Result Value Ref Range Status   Specimen Description FLUID SYNOVIAL LEFT SHOULDER  Final   Special Requests NONE  Final   Gram Stain   Final    ABUNDANT WBC PRESENT, PREDOMINANTLY PMN RARE GRAM POSITIVE COCCI Performed at [facility], [address] [place], [place] [address]    Culture   Final    RARE METHICILLIN RESISTANT STAPHYLOCOCCUS AUREUS NO ANAEROBES ISOLATED; CULTURE IN PROGRESS FOR 5 DAYS    Report Status PENDING  Incomplete   Organism ID, Bacteria METHICILLIN RESISTANT STAPHYLOCOCCUS AUREUS  Final      Susceptibility   Methicillin resistant staphylococcus aureus - MIC*    CIPROFLOXACIN <=0.5 SENSITIVE Sensitive     ERYTHROMYCIN >=8 RESISTANT Resistant     GENTAMICIN <=0.5 SENSITIVE Sensitive     OXACILLIN >=4 RESISTANT Resistant     TETRACYCLINE <=1 SENSITIVE Sensitive     VANCOMYCIN 1 SENSITIVE Sensitive     TRIMETH/SULFA <=10 SENSITIVE Sensitive     CLINDAMYCIN <=0.25 SENSITIVE Sensitive     RIFAMPIN <=0.5 SENSITIVE Sensitive     Inducible Clindamycin NEGATIVE Sensitive     * RARE METHICILLIN RESISTANT STAPHYLOCOCCUS AUREUS    Robert StablerQuan  Bow Buntyn, DO [facility] [phone] pager    03/20/2020, 11:17 AM

## 2020-03-20 NOTE — Progress Notes (Signed)
[facility]  PROGRESS NOTE  Robert Kramer:[id number] DOB: October 25, 1975 DOA: 03/11/2020 PCP: Patient, No Pcp Per   Brief HPI:   44 year old male with history of polysubstance use including IV drug use, bipolar disorder came to ED for evaluation of abnormal blood cultures.  Patient was seen in this long ED on 03/06/2020 for evaluation management of heroin withdrawal.  He was treated supportively.  Blood culture was obtained and grew MRSA and Serratia marcescens.  He was called from home to come to ED for further evaluation.  CTA chest PE showed multiple cavitary and noncavitary nodules consistent with septic emboli, bilateral lower lobe pneumonia and bilateral hilar adenopathy felt to be reactive.  No pulmonary embolus was reported.  Patient was started on IV ceftriaxone and vancomycin.  TTE was performed which showed shaggy vegetations on tricuspid valve.  TEE on 03/15/2020 showed 2 x 2 cm long vegetation on tricuspid valve with resultant severe TR.  MRI left shoulder showed septic arthropathy, myositis and tenosynovitis.  He underwent washout of left shoulder by orthopedic surgery on 03/15/2020.  [facility] further evaluation of endocarditis by cardiothoracic surgery.    Subjective   Patient seen and examined, he is scheduled for [facility] with CT surgery on 03/21/2020.  He denies any chest pain or shortness of breath.   Assessment/Plan:     1. Severe sepsis secondary to MRSA and Serratia bacteremia-pulmonary septic emboli, TV endocarditis, left shoulder septic arthritis.  Patient is status post left shoulder arthroscopy with debridement irrigation for septic arthritis as per orthopedics on 03/15/2020.  Echocardiogram showed tricuspid vegetation.  TEE showed 2x2 mobile vegetation with severe TR.  MRI lumbar spine showed no evidence of epidural abscess.  Patient refused MRI right shoulder ordered by ID.  CT surgery was consulted and plan for angiogram  debridement on 03/21/2020.  Infectious disease following.  Continue vancomycin, Rocephin was changed to cefepime per ID.Marland Kitchen  Repeat blood cultures drawn on 9/8 are negative. 2. Hyponatremia-sodium is stable at 130.  Has been low since last month.  Will obtain serum osmolality, urine sodium. 3. Substance use disorder-patient has ongoing IV heroin use, last use was 3 days prior to admission.  Patient started on Suboxone 8/2 mg twice daily with as needed Suboxone per opiate withdrawal protocol.  Check HIV and hepatitis C antibodies. 4. Thrombocytopenia-likely due to sepsis.  Platelet count has improved to 146,000. 5. Anemia-hemoglobin is 7.4, unclear etiology, will obtain anemia panel.  Check FOBT.     COVID-19 Labs  No results for input(s): DDIMER, FERRITIN, LDH, CRP in the last 72 hours.  Lab Results  Component Value Date   SARSCOV2NAA NEGATIVE 03/11/2020     Scheduled medications:   . buprenorphine-naloxone  1 tablet Sublingual BID  . Chlorhexidine Gluconate Cloth  6 each Topical Daily  . feeding supplement (ENSURE ENLIVE)  237 mL Oral TID BM  . multivitamin with minerals  1 tablet Oral Daily  . sodium chloride flush  10-40 mL Intracatheter Q12H         CBG: No results for input(s): GLUCAP in the last 168 hours.  SpO2: 100 %    CBC: Recent Labs  Lab 03/14/20 0503 03/14/20 0503 03/16/20 9562 03/16/20 1308 03/16/20 1244 03/17/20 0639 03/18/20 0335 03/19/20 0904 03/20/20 0500  WBC 15.6*   < > 9.6   < > 12.5* 11.8* 11.8* 10.5 11.8*  NEUTROABS 12.2*  --  7.5  --  9.9* 9.8* 9.1*  --   --  HGB 8.1*   < > 7.2*   < > 7.7* 8.0* 7.3* 8.0* 7.4*  HCT 24.5*   < > 21.6*   < > 22.5* 25.4* 23.5* 24.9* 23.9*  MCV 85.7   < > 84.0   < > 81.2 88.8 86.4 87.1 89.8  PLT 47*   < > 61*   < > 78* 98* 112* 131* 146*   < > = values in this interval not displayed.    Basic Metabolic Panel: Recent Labs  Lab 03/17/20 0639 03/17/20 1058 03/18/20 0335 03/19/20 0904 03/20/20 0500  NA  126* 129* 130* 131* 130*  K 4.1 5.5* 4.1 3.6 4.1  CL 94* 96* 97* 97* 97*  CO2 26 26 25 29 29   GLUCOSE 106* 125* 124* 92 109*  BUN 14 16 11 11 12   CREATININE 0.54* 0.59* 0.66 0.62 0.79  CALCIUM 7.9* 8.0* 7.9* 8.2* 8.1*  MG 1.8  --  1.8  --   --      Liver Function Tests: Recent Labs  Lab 03/17/20 0639  AST 32  ALT 27  ALKPHOS 50  BILITOT 0.9  PROT 6.3*  ALBUMIN 1.3*     Antibiotics: Anti-infectives (From admission, onward)   Start     Dose/Rate Route Frequency Ordered Stop   03/20/20 1400  ceFEPIme (MAXIPIME) 2 g in sodium chloride 0.9 % 100 mL IVPB        2 g 200 mL/hr over 30 Minutes Intravenous Every 8 hours 03/20/20 0912     03/19/20 2200  vancomycin (VANCOCIN) IVPB 1000 mg/200 mL premix        1,000 mg 200 mL/hr over 60 Minutes Intravenous Every 12 hours 03/19/20 1258     03/18/20 1400  cefTRIAXone (ROCEPHIN) 2 g in sodium chloride 0.9 % 100 mL IVPB  Status:  Discontinued        2 g 200 mL/hr over 30 Minutes Intravenous Every 24 hours 03/18/20 1105 03/20/20 0909   03/12/20 2000  cefTRIAXone (ROCEPHIN) 2 g in sodium chloride 0.9 % 100 mL IVPB  Status:  Discontinued        2 g 200 mL/hr over 30 Minutes Intravenous Every 24 hours 03/11/20 2058 03/12/20 0937   03/12/20 1400  ceFEPIme (MAXIPIME) 2 g in sodium chloride 0.9 % 100 mL IVPB  Status:  Discontinued        2 g 200 mL/hr over 30 Minutes Intravenous Every 8 hours 03/12/20 0937 03/18/20 1105   03/12/20 0000  vancomycin (VANCOCIN) IVPB 1000 mg/200 mL premix  Status:  Discontinued        1,000 mg 200 mL/hr over 60 Minutes Intravenous Every 8 hours 03/11/20 1816 03/19/20 1258   03/11/20 1830  cefTRIAXone (ROCEPHIN) 2 g in sodium chloride 0.9 % 100 mL IVPB        2 g 200 mL/hr over 30 Minutes Intravenous  Once 03/11/20 1822 03/11/20 2030   03/11/20 1545  vancomycin (VANCOREADY) IVPB 1500 mg/300 mL        1,500 mg 150 mL/hr over 120 Minutes Intravenous  Once 03/11/20 1532 03/11/20 1819       DVT prophylaxis:  SCDs  Code Status: Full code  Family Communication: No family at bedside    Status is: Inpatient  Dispo: The patient is from: Home              Anticipated d/c is to: Home              Anticipated d/c date is:  03/25/2020              Patient currently not medically stable for discharge  Barrier to discharge-plan for angiogram procedure by CT surgery on 03/21/2020  Pressure Injury 03/19/20 Coccyx Mid Stage 2 -  Partial thickness loss of dermis presenting as a shallow open injury with a red, pink wound bed without slough. (Active)  03/19/20 2100  Location: Coccyx  Location Orientation: Mid  Staging: Stage 2 -  Partial thickness loss of dermis presenting as a shallow open injury with a red, pink wound bed without slough.  Wound Description (Comments):   Present on Admission: No         Consultants:  CT surgery  Procedures:     Objective   Vitals:   03/19/20 1632 03/19/20 2019 03/20/20 0500 03/20/20 1300  BP: 111/74 109/81 107/78 106/75  Pulse: 98 100 (!) 109 (!) 106  Resp: 18 18 18 16   Temp: 98.5 F (36.9 C) 98.2 F (36.8 C) 99.3 F (37.4 C) 98.7 F (37.1 C)  TempSrc: Oral Oral Oral Oral  SpO2: 98% 98%  100%  Weight:      Height:        Intake/Output Summary (Last 24 hours) at 03/20/2020 1833 Last data filed at 03/20/2020 0500 Gross per 24 hour  Intake 360 ml  Output 600 ml  Net -240 ml    09/13 1901 - 09/15 0700 In: 940 [P.O.:840] Out: 1500 [Urine:1500]  Filed Weights   03/11/20 1521 03/18/20 0500 03/19/20 0543  Weight: 70.8 kg 64 kg 64 kg    Physical Examination:    General: Appears in no acute distress  Cardiovascular: S1-S2, regular, no murmur auscultated  Respiratory: Clear to auscultation bilaterally   Abdomen: Abdomen is soft, nontender, no organomegaly  Extremities: No edema in the lower extremities  Neurologic: Alert, oriented x3, no focal deficit noted    Data Reviewed:   Recent Results (from the past 240 hour(s))   Urine culture     Status: Abnormal   Collection Time: 03/11/20  3:28 PM   Specimen: In/Out Cath Urine  Result Value Ref Range Status   Specimen Description   Final    IN/OUT CATH URINE Performed at [facility], [address], [place], [place]    Special Requests   Final    NONE Performed at [facility], [address], [place], [place]    Culture MULTIPLE SPECIES PRESENT, SUGGEST RECOLLECTION (A)  Final   Report Status 03/13/2020 FINAL  Final  Blood culture (routine x 2)     Status: Abnormal   Collection Time: 03/11/20  3:31 PM   Specimen: BLOOD  Result Value Ref Range Status   Specimen Description   Final    BLOOD LEFT ARM Performed at [facility], [address] [place], [place]    Special Requests   Final    BOTTLES DRAWN AEROBIC AND ANAEROBIC Blood Culture results may not be optimal due to an excessive volume of blood received in culture bottles Performed at [facility] [address], [place], [place]    Culture  Setup Time   Final    GRAM POSITIVE COCCI IN CLUSTERS IN BOTH AEROBIC AND ANAEROBIC BOTTLES CRITICAL VALUE NOTED.  VALUE IS CONSISTENT WITH PREVIOUSLY REPORTED AND CALLED VALUE.    Culture (A)  Final    STAPHYLOCOCCUS AUREUS SUSCEPTIBILITIES PERFORMED ON PREVIOUS CULTURE WITHIN THE LAST 5 DAYS. Performed at  [facility], [address], [place], [place] [address]    Report Status 03/14/2020 FINAL  Final  SARS Coronavirus 2 by RT PCR (hospital order, performed in [facility] hospital lab) Nasopharyngeal Nasopharyngeal Swab     Status: None   Collection Time: 03/11/20  5:05 PM   Specimen: Nasopharyngeal Swab  Result Value Ref Range Status   SARS Coronavirus 2 NEGATIVE NEGATIVE Final    Comment: (NOTE) SARS-CoV-2 target nucleic acids are NOT DETECTED.  The SARS-CoV-2 RNA is generally detectable in upper and lower respiratory  specimens during the acute phase of infection. The lowest concentration of SARS-CoV-2 viral copies this assay can detect is 250 copies / mL. A negative result does not preclude SARS-CoV-2 infection and should not be used as the sole basis for treatment or other patient management decisions.  A negative result may occur with improper specimen collection / handling, submission of specimen other than nasopharyngeal swab, presence of viral mutation(s) within the areas targeted by this assay, and inadequate number of viral copies (<250 copies / mL). A negative result must be combined with clinical observations, patient history, and epidemiological information.  Fact Sheet for Patients:   BoilerBrush.com.cy  Fact Sheet for Healthcare Providers: https://pope.com/  This test is not yet approved or  cleared by the [place] FDA and has been authorized for detection and/or diagnosis of SARS-CoV-2 by FDA under an Emergency Use Authorization (EUA).  This EUA will remain in effect (meaning this test can be used) for the duration of the COVID-19 declaration under Section 564(b)(1) of the Act, 21 U.S.C. section 360bbb-3(b)(1), unless the authorization is terminated or revoked sooner.  Performed at [facility], [address], [place], [place] [address]   Culture, blood (Routine X 2) w Reflex to ID Panel     Status: None   Collection Time: 03/13/20 11:09 AM   Specimen: BLOOD  Result Value Ref Range Status   Specimen Description   Final    BLOOD RIGHT ANTECUBITAL Performed at [facility], [address], [place], [place] [address]    Special Requests   Final    BOTTLES DRAWN AEROBIC AND ANAEROBIC Blood Culture adequate volume Performed at [facility], [address], [place], [place] [address]    Culture   Final    NO GROWTH 7 DAYS Performed at [facility], [address], [place], [place] [address]    Report Status 03/20/2020 FINAL  Final  Culture, blood (Routine X 2) w Reflex to ID Panel     Status: None   Collection Time: 03/13/20 11:10 AM   Specimen: BLOOD RIGHT HAND  Result Value Ref Range Status   Specimen Description   Final    BLOOD RIGHT HAND Performed at [facility], [address], [place], [place] [address]    Special Requests   Final    BOTTLES DRAWN AEROBIC ONLY Blood Culture results may not be optimal due to an inadequate volume of blood received in culture bottles Performed at [facility], [address] [place], [place] [address]    Culture   Final    NO GROWTH 7 DAYS Performed at [facility], [address], [place], [place] [address]    Report Status 03/20/2020 FINAL  Final  Surgical pcr screen     Status: None   Collection Time: 03/15/20 11:56 AM   Specimen: Nasal Mucosa; Nasal Swab  Result Value Ref  Range Status   MRSA, PCR NEGATIVE NEGATIVE Final   Staphylococcus aureus NEGATIVE NEGATIVE Final    Comment: (NOTE) The Xpert SA Assay (FDA approved for NASAL specimens in patients 46 years of age and older), is one component of a comprehensive surveillance program. It is not intended to diagnose infection nor to guide or monitor treatment. Performed at [facility], [address] [place], [place] [address]   Aerobic/Anaerobic Culture (surgical/deep wound)     Status: None   Collection Time: 03/15/20  2:07 PM   Specimen: Synovial, Left Shoulder; Body Fluid  Result Value Ref Range Status   Specimen Description FLUID SYNOVIAL LEFT SHOULDER  Final   Special Requests NONE  Final   Gram Stain   Final    ABUNDANT WBC PRESENT, PREDOMINANTLY PMN RARE GRAM POSITIVE COCCI    Culture   Final    RARE METHICILLIN RESISTANT STAPHYLOCOCCUS AUREUS NO ANAEROBES ISOLATED Performed at [facility], [address] [place], [place] [address]    Report Status 03/20/2020 FINAL   Final   Organism ID, Bacteria METHICILLIN RESISTANT STAPHYLOCOCCUS AUREUS  Final      Susceptibility   Methicillin resistant staphylococcus aureus - MIC*    CIPROFLOXACIN <=0.5 SENSITIVE Sensitive     ERYTHROMYCIN >=8 RESISTANT Resistant     GENTAMICIN <=0.5 SENSITIVE Sensitive     OXACILLIN >=4 RESISTANT Resistant     TETRACYCLINE <=1 SENSITIVE Sensitive     VANCOMYCIN 1 SENSITIVE Sensitive     TRIMETH/SULFA <=10 SENSITIVE Sensitive     CLINDAMYCIN <=0.25 SENSITIVE Sensitive     RIFAMPIN <=0.5 SENSITIVE Sensitive     Inducible Clindamycin NEGATIVE Sensitive     * RARE METHICILLIN RESISTANT STAPHYLOCOCCUS AUREUS     Studies:  US EKG SITE RITE  Result Date: 03/19/2020 If Site Rite image not attached, placement could not be confirmed due to current cardiac rhythm.      Meredeth IdeGagan S Akyra Bouchie   [facility] If 7PM-7AM, please contact night-coverage at [email or web address], Office  [phone]   03/20/2020, 6:33 PM  LOS: 9 days

## 2020-03-20 NOTE — Progress Notes (Signed)
Pharmacy Antibiotic Note  Robert Kramer is a 44 y.o. 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 Angiovac procedure is scheduled for 9/16. Additionally, patient has L shoulder septic arthritis s/p I&D 9/10 with culture growing rare MRSA. Pharmacy has been consulted for cefepime dosing for Serratia bacteremia/endocarditis.  As Serratia marcecens is an AmpC-producing organism, will switch from ceftriaxone to cefepime in order to reduce potential for AmpC beta-lactamase induction with ceftriaxone. There is limited data on clinical outcomes to support the use of third generation cephalosporins in AmpC-producers; however, in vitro and in vivo studies demonstrate that up to 30% of ceftriaxone-susceptible AmpC-producing isolates eventually are induced after third-generation cephalosporin monotherapy.  Plan: Discontinue ceftriaxone Initiate cefepime 2g Q 8 hours Continue vancomycin to 1000 mg Q 12 hours  Monitor renal function and clinical signs/symptoms of infection   Height: 5\' 11"  (180.3 cm) Weight: 64 kg (141 lb 1.6 oz) IBW/kg (Calculated) : 75.3  Temp (24hrs), Avg:98.7 F (37.1 C), Min:98.2 F (36.8 C), Max:99.3 F (37.4 C)  Recent Labs  Lab 03/16/20 1244 03/16/20 1244 03/17/20 0639 03/17/20 1058 03/18/20 0335 03/19/20 0904 03/20/20 0500  WBC 12.5*  --  11.8*  --  11.8* 10.5 11.8*  CREATININE 0.54*   < > 0.54* 0.59* 0.66 0.62 0.79  VANCOTROUGH  --   --   --   --   --  28*  --    < > = values in this interval not displayed.    Estimated Creatinine Clearance: 104.4 mL/min (by C-G formula based on SCr of 0.79 mg/dL).    Allergies  Allergen Reactions  . Tramadol Other (See Comments)    Upset stomach   Antimicrobials this admission: Vancomycin 9/6 >>  Rocephin 9/6 >>9/7, 9/13>>9/15 Cefepime 9/7 >>9/13, 9/15>>   Microbiology results: 9/1 BCx x1: MRSA 9/1 BCx x1: Serratia Marcescens (S= Bactrim, Cipro;  R=Cefazolin) 9/6 UCx: mult sp F 9/6 BCx: MRSA F 9/6 COVID: neg 9/8 Hep A reactive 9/8 Hep B NR 9/8 Hep C reactive 9/8 BCx2 ngtd 9/10 MRSA PCR neg  9/10 L shoulder synovial fluid: rare MRSA  Thank you for allowing pharmacy to be a part of this patient's care.  11/8, PharmD PGY2 ID Pharmacy Resident [id number]  03/20/2020 9:44 AM

## 2020-03-20 NOTE — Progress Notes (Signed)
Occupational Therapy Treatment Patient Details Name: Robert Kramer MRN: [id number] DOB: Oct 03, 1975 Today's Date: 03/20/2020    History of present illness 44 y.o. 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   OT comments  Patient with good participation this date.  Patient states his shoulder is not as painful this date.  HEP sheet issued and reviewed to increase compliance.  Patient does require cueing and demo to complete.  He is scheduled for a procedure 9/16, and OT will follow as appropriate in the acute setting.    Follow Up Recommendations  Other (comment) (to be determined - based on progress)    Equipment Recommendations       Recommendations for Other Services      Precautions / Restrictions Precautions Precautions: Fall Restrictions Weight Bearing Restrictions: No Other Position/Activity Restrictions: LUE WBAT, AROM as tolerated, able to lift arm                                                                       Praxis      Cognition Arousal/Alertness: Awake/alert Behavior During Therapy: WFL for tasks assessed/performed Overall Cognitive Status: Within Functional Limits for tasks assessed                                 General Comments: patient needs verbal cues and HEP sheet to complete UB exercises.        Exercises General Exercises - Upper Extremity Shoulder  Flexion: [name];Left;10 reps;Supine Shoulder Extension: [name];Left;10 reps;Supine Shoulder ABduction: [name];Left;10 reps;Supine Shoulder ADduction: [name];Left;10 reps;Supine Elbow Flexion: AROM;15 reps;Seated;Left Elbow Extension: AROM;15 reps;Left;Seated Wrist Flexion: AROM;15 reps;Left;Seated Wrist Extension: AROM;15 reps;Left;Seated Digit Composite Flexion: AROM;15 reps;Left Composite Extension: AROM;15 reps;Left   Shoulder Instructions       General Comments      Pertinent Vitals/ Pain       Pain Assessment: 0-10 Pain Score: 3  Faces Pain Scale: Hurts a little bit Pain Location: L shoulder Pain Descriptors / Indicators: Discomfort;Nagging Pain Intervention(s): Limited activity within patient's tolerance;Monitored during session                                                          Frequency  Min 2X/week        Progress Toward Goals  OT Goals(current goals can now be found in the care plan section)     Acute Rehab OT Goals Patient Stated Goal: I want to get up and get out of this room. OT Goal Formulation: With patient Time For Goal  Achievement: 04/01/20 Potential to Achieve Goals: Good  Plan      Co-evaluation                 AM-PAC OT "6 Clicks" Daily Activity     Outcome Measure   Help from another person eating meals?: None Help from another person taking care of personal grooming?: None Help from another person toileting, which includes using toliet, bedpan, or urinal?: A Little Help from another person bathing (including washing, rinsing, drying)?: A Little Help from another person to put on and taking off regular upper body clothing?: A Little Help from another person to put on and taking off regular lower body clothing?: A Little 6 Click Score: 20    End of Session    OT Visit Diagnosis: Unsteadiness on feet (R26.81);Pain;Muscle weakness (generalized) (M62.81) Pain - Right/Left: Left Pain - part of body:  Shoulder   Activity Tolerance Patient tolerated treatment well   Patient Left in bed;with call bell/phone within reach   Nurse Communication          Time: 1202-1215 OT Time Calculation (min): 13 min  Charges: OT General Charges $OT Visit: 1 Visit OT Treatments $Therapeutic Exercise: 8-22 mins  03/20/2020  Rich, OTR/L  Acute Rehabilitation Services  Office:  [phone]    Suzanna Obey 03/20/2020, 1:21 PM

## 2020-03-21 ENCOUNTER — Encounter (HOSPITAL_COMMUNITY)
Admission: EM | Disposition: A | Discharge status: Home / Self Care | Payer: Self-pay | Source: Home / Self Care | Attending: Internal Medicine

## 2020-03-21 ENCOUNTER — Inpatient Hospital Stay (HOSPITAL_COMMUNITY): Payer: Self-pay

## 2020-03-21 DIAGNOSIS — I351 Nonrheumatic aortic (valve) insufficiency: Secondary | ICD-10-CM

## 2020-03-21 DIAGNOSIS — I361 Nonrheumatic tricuspid (valve) insufficiency: Secondary | ICD-10-CM

## 2020-03-21 HISTORY — PX: APPLICATION OF ANGIOVAC: SHX6777

## 2020-03-21 LAB — ECHO INTRAOPERATIVE TEE
Height: 71 in
Weight: 2264.57 oz

## 2020-03-21 LAB — CBC
HCT: 27.5 % — ABNORMAL LOW (ref 39.0–52.0)
Hemoglobin: 8.6 g/dL — ABNORMAL LOW (ref 13.0–17.0)
MCH: 27.4 pg (ref 26.0–34.0)
MCHC: 31.3 g/dL (ref 30.0–36.0)
MCV: 87.6 fL (ref 80.0–100.0)
Platelets: 148 10*3/uL — ABNORMAL LOW (ref 150–400)
RBC: 3.14 MIL/uL — ABNORMAL LOW (ref 4.22–5.81)
RDW: 16.4 % — ABNORMAL HIGH (ref 11.5–15.5)
WBC: 10.8 10*3/uL — ABNORMAL HIGH (ref 4.0–10.5)
nRBC: 0 % (ref 0.0–0.2)

## 2020-03-21 LAB — COMPREHENSIVE METABOLIC PANEL
ALT: 23 U/L (ref 0–44)
AST: 40 U/L (ref 15–41)
Albumin: 1.1 g/dL — ABNORMAL LOW (ref 3.5–5.0)
Alkaline Phosphatase: 52 U/L (ref 38–126)
Anion gap: 4 — ABNORMAL LOW (ref 5–15)
BUN: 12 mg/dL (ref 6–20)
CO2: 30 mmol/L (ref 22–32)
Calcium: 8.3 mg/dL — ABNORMAL LOW (ref 8.9–10.3)
Chloride: 97 mmol/L — ABNORMAL LOW (ref 98–111)
Creatinine, Ser: 0.69 mg/dL (ref 0.61–1.24)
GFR calc Af Amer: >60 mL/min (ref >60)
GFR calc non Af Amer: >60 mL/min (ref >60)
Glucose, Bld: 112 mg/dL — ABNORMAL HIGH (ref 70–99)
Potassium: 4.1 mmol/L (ref 3.5–5.1)
Sodium: 131 mmol/L — ABNORMAL LOW (ref 135–145)
Total Bilirubin: 1.5 mg/dL — ABNORMAL HIGH (ref 0.3–1.2)
Total Protein: 6.9 g/dL (ref 6.5–8.1)

## 2020-03-21 LAB — PREPARE RBC (CROSSMATCH)

## 2020-03-21 LAB — OSMOLALITY: Osmolality: 281 mOsm/kg (ref 275–295)

## 2020-03-21 LAB — POCT ACTIVATED CLOTTING TIME
Activated Clotting Time: 142 seconds
Activated Clotting Time: 230 seconds
Activated Clotting Time: 252 seconds
Activated Clotting Time: 263 seconds
Activated Clotting Time: 147 seconds
Activated Clotting Time: 263 seconds

## 2020-03-21 LAB — SURGICAL PCR SCREEN
MRSA, PCR: NEGATIVE
Staphylococcus aureus: NEGATIVE

## 2020-03-21 SURGERY — APPLICATION OF ANGIOVAC
Anesthesia: General | Site: Chest

## 2020-03-21 MED ORDER — PROMETHAZINE HCL 25 MG/ML IJ SOLN: 6.2500 mg | INTRAMUSCULAR | Status: DC | PRN

## 2020-03-21 MED ORDER — VASOPRESSIN 20 UNIT/ML IV SOLN
INTRAVENOUS | Status: DC | PRN
  Administered 2020-03-21: 1 [IU] via INTRAVENOUS

## 2020-03-21 MED ORDER — ETOMIDATE 2 MG/ML IV SOLN
INTRAVENOUS | Status: AC
  Filled 2020-03-21: qty 10

## 2020-03-21 MED ORDER — DEXAMETHASONE SODIUM PHOSPHATE 10 MG/ML IJ SOLN
INTRAMUSCULAR | Status: AC
  Filled 2020-03-21: qty 1

## 2020-03-21 MED ORDER — 0.9 % SODIUM CHLORIDE (POUR BTL) OPTIME
TOPICAL | Status: DC | PRN
  Administered 2020-03-21: 1000 mL

## 2020-03-21 MED ORDER — SODIUM CHLORIDE 0.9 % IV SOLN
10.0000 mL/h | Freq: Once | INTRAVENOUS | Status: AC
  Administered 2020-03-27: 10 mL/h via INTRAVENOUS

## 2020-03-21 MED ORDER — MIDAZOLAM HCL 2 MG/2ML IJ SOLN
INTRAMUSCULAR | Status: AC
  Filled 2020-03-21: qty 2

## 2020-03-21 MED ORDER — SODIUM CHLORIDE 0.9 % IV SOLN
INTRAVENOUS | Status: AC
  Filled 2020-03-21: qty 1.2

## 2020-03-21 MED ORDER — PROTAMINE SULFATE 10 MG/ML IV SOLN
INTRAVENOUS | Status: AC
  Filled 2020-03-21: qty 25

## 2020-03-21 MED ORDER — SODIUM CHLORIDE 0.9 % IV SOLN
INTRAVENOUS | Status: DC | PRN
  Administered 2020-03-21: 50 ug/min via INTRAVENOUS

## 2020-03-21 MED ORDER — SUCCINYLCHOLINE CHLORIDE 20 MG/ML IJ SOLN
INTRAMUSCULAR | Status: DC | PRN
  Administered 2020-03-21: 120 mg via INTRAVENOUS

## 2020-03-21 MED ORDER — DEXAMETHASONE SODIUM PHOSPHATE 10 MG/ML IJ SOLN
INTRAMUSCULAR | Status: DC | PRN
  Administered 2020-03-21: 5 mg via INTRAVENOUS

## 2020-03-21 MED ORDER — VASOPRESSIN 20 UNIT/ML IV SOLN
INTRAVENOUS | Status: AC
  Filled 2020-03-21: qty 1

## 2020-03-21 MED ORDER — ROCURONIUM BROMIDE 10 MG/ML (PF) SYRINGE
PREFILLED_SYRINGE | INTRAVENOUS | Status: AC
  Filled 2020-03-21: qty 10

## 2020-03-21 MED ORDER — SUCCINYLCHOLINE CHLORIDE 200 MG/10ML IV SOSY
PREFILLED_SYRINGE | INTRAVENOUS | Status: AC
  Filled 2020-03-21: qty 10

## 2020-03-21 MED ORDER — ROCURONIUM BROMIDE 10 MG/ML (PF) SYRINGE
PREFILLED_SYRINGE | INTRAVENOUS | Status: DC | PRN
  Administered 2020-03-21 (×2): 50 mg via INTRAVENOUS

## 2020-03-21 MED ORDER — BUPIVACAINE HCL (PF) 0.5 % IJ SOLN
INTRAMUSCULAR | Status: AC
  Filled 2020-03-21: qty 30

## 2020-03-21 MED ORDER — MINERAL OIL LIGHT 100 % EX OIL
TOPICAL_OIL | CUTANEOUS | Status: DC | PRN
  Administered 2020-03-21: 1 via TOPICAL

## 2020-03-21 MED ORDER — LACTATED RINGERS IV SOLN: INTRAVENOUS | Status: DC | PRN

## 2020-03-21 MED ORDER — MIDAZOLAM HCL 5 MG/5ML IJ SOLN
INTRAMUSCULAR | Status: DC | PRN
  Administered 2020-03-21 (×2): 1 mg via INTRAVENOUS

## 2020-03-21 MED ORDER — FENTANYL CITRATE (PF) 100 MCG/2ML IJ SOLN
INTRAMUSCULAR | Status: DC | PRN
Start: 2020-03-21 — End: 2020-03-21

## 2020-03-21 MED ORDER — FENTANYL CITRATE (PF) 100 MCG/2ML IJ SOLN
INTRAMUSCULAR | Status: DC | PRN
  Administered 2020-03-21: 50 ug via INTRAVENOUS
  Administered 2020-03-21: 100 ug via INTRAVENOUS

## 2020-03-21 MED ORDER — HYDROMORPHONE HCL 1 MG/ML IJ SOLN: 0.2500 mg | INTRAMUSCULAR | Status: DC | PRN

## 2020-03-21 MED ORDER — PHENYLEPHRINE 40 MCG/ML (10ML) SYRINGE FOR IV PUSH (FOR BLOOD PRESSURE SUPPORT)
PREFILLED_SYRINGE | INTRAVENOUS | Status: AC
  Filled 2020-03-21: qty 10

## 2020-03-21 MED ORDER — EPHEDRINE 5 MG/ML INJ
INTRAVENOUS | Status: AC
  Filled 2020-03-21: qty 10

## 2020-03-21 MED ORDER — LIDOCAINE 2% (20 MG/ML) 5 ML SYRINGE
INTRAMUSCULAR | Status: DC | PRN
  Administered 2020-03-21: 100 mg via INTRAVENOUS

## 2020-03-21 MED ORDER — LIDOCAINE 2% (20 MG/ML) 5 ML SYRINGE
INTRAMUSCULAR | Status: AC
  Filled 2020-03-21: qty 5

## 2020-03-21 MED ORDER — HEPARIN SODIUM (PORCINE) 1000 UNIT/ML IJ SOLN
INTRAMUSCULAR | Status: AC
  Filled 2020-03-21: qty 2

## 2020-03-21 MED ORDER — ONDANSETRON HCL 4 MG/2ML IJ SOLN
INTRAMUSCULAR | Status: AC
  Filled 2020-03-21: qty 2

## 2020-03-21 MED ORDER — ONDANSETRON HCL 4 MG/2ML IJ SOLN
INTRAMUSCULAR | Status: DC | PRN
  Administered 2020-03-21: 4 mg via INTRAVENOUS

## 2020-03-21 MED ORDER — SUGAMMADEX SODIUM 200 MG/2ML IV SOLN
INTRAVENOUS | Status: DC | PRN
  Administered 2020-03-21: 200 mg via INTRAVENOUS

## 2020-03-21 MED ORDER — SODIUM CHLORIDE 0.9 % IV SOLN
INTRAVENOUS | Status: DC | PRN
  Administered 2020-03-21: 07:00:00 500 mL

## 2020-03-21 MED ORDER — DEXMEDETOMIDINE (PRECEDEX) IN NS 20 MCG/5ML (4 MCG/ML) IV SYRINGE
PREFILLED_SYRINGE | INTRAVENOUS | Status: AC
  Filled 2020-03-21: qty 5

## 2020-03-21 MED ORDER — FENTANYL CITRATE (PF) 250 MCG/5ML IJ SOLN
INTRAMUSCULAR | Status: AC
  Filled 2020-03-21: qty 5

## 2020-03-21 MED ORDER — PHENYLEPHRINE HCL (PRESSORS) 10 MG/ML IV SOLN
INTRAVENOUS | Status: DC | PRN
  Administered 2020-03-21: 200 ug via INTRAVENOUS
  Administered 2020-03-21: 100 ug via INTRAVENOUS
  Administered 2020-03-21: 200 ug via INTRAVENOUS

## 2020-03-21 MED ORDER — ALBUMIN HUMAN 5 % IV SOLN: INTRAVENOUS | Status: DC | PRN

## 2020-03-21 MED ORDER — PROTAMINE SULFATE 10 MG/ML IV SOLN
INTRAVENOUS | Status: DC | PRN
  Administered 2020-03-21: 20 mg via INTRAVENOUS
  Administered 2020-03-21 (×2): 50 mg via INTRAVENOUS
  Administered 2020-03-21: 10 mg via INTRAVENOUS

## 2020-03-21 MED ORDER — PROPOFOL 10 MG/ML IV BOLUS
INTRAVENOUS | Status: AC
  Filled 2020-03-21: qty 20

## 2020-03-21 MED ORDER — GLYCOPYRROLATE PF 0.2 MG/ML IJ SOSY
PREFILLED_SYRINGE | INTRAMUSCULAR | Status: AC
  Filled 2020-03-21: qty 1

## 2020-03-21 MED ORDER — HEPARIN SODIUM (PORCINE) 1000 UNIT/ML IJ SOLN
INTRAMUSCULAR | Status: DC | PRN
  Administered 2020-03-21: 10000 [IU] via INTRAVENOUS
  Administered 2020-03-21: 5000 [IU] via INTRAVENOUS
  Administered 2020-03-21: 3000 [IU] via INTRAVENOUS

## 2020-03-21 MED ORDER — ETOMIDATE 2 MG/ML IV SOLN
INTRAVENOUS | Status: DC | PRN
  Administered 2020-03-21: 14 mg via INTRAVENOUS

## 2020-03-21 SURGICAL SUPPLY — 62 items
ADH SKN CLS APL DERMABOND .7 (GAUZE/BANDAGES/DRESSINGS) ×1
ANGIOVAC CIRCUIT GEN3 (MISCELLANEOUS) ×3
BAG BANDED W/RUBBER/TAPE 36X54 (MISCELLANEOUS) ×3 IMPLANT
BAG EQP BAND 135X91 W/RBR TAPE (MISCELLANEOUS) ×1
BLADE STERNUM SYSTEM 6 (BLADE) ×2 IMPLANT
CANISTER SUCT 3000ML PPV (MISCELLANEOUS) ×3 IMPLANT
CANNULA C180 ANGIOVAC 180 DEG (CANNULA) ×2 IMPLANT
CANNULA C20 ANGIOVAC 20 DEG (CANNULA) IMPLANT
CANNULA OPTISITE PERFUSION 16F (CANNULA) IMPLANT
CANNULA OPTISITE PERFUSION 18F (CANNULA) ×2 IMPLANT
CANNULA OPTISITE PERFUSION 20F (CANNULA) IMPLANT
CATH ROBINSON RED A/P 18FR (CATHETERS) ×3 IMPLANT
CLIP VESOCCLUDE MED 24/CT (CLIP) IMPLANT
CLIP VESOCCLUDE SM WIDE 24/CT (CLIP) IMPLANT
CNTNR URN SCR LID CUP LEK RST (MISCELLANEOUS) IMPLANT
CONT SPEC 4OZ STRL OR WHT (MISCELLANEOUS) ×3
COVER PROBE W GEL 5X96 (DRAPES) ×3 IMPLANT
DERMABOND ADVANCED (GAUZE/BANDAGES/DRESSINGS) ×2
DERMABOND ADVANCED .7 DNX12 (GAUZE/BANDAGES/DRESSINGS) ×1 IMPLANT
DRAPE C-ARM 42X72 X-RAY (DRAPES) ×2 IMPLANT
DRAPE INCISE IOBAN 66X45 STRL (DRAPES) ×2 IMPLANT
DRYSEAL FLEXSHEATH 18FR 33CM (SHEATH)
DRYSEAL FLEXSHEATH 26FR 33CM (SHEATH) ×2
ELECT REM PT RETURN 9FT ADLT (ELECTROSURGICAL) ×3
ELECTRODE REM PT RTRN 9FT ADLT (ELECTROSURGICAL) ×1 IMPLANT
FELT TEFLON 1X6 (MISCELLANEOUS) ×3 IMPLANT
GAUZE SPONGE 4X4 12PLY STRL (GAUZE/BANDAGES/DRESSINGS) ×2 IMPLANT
GLOVE BIO SURGEON STRL SZ 6.5 (GLOVE) ×1 IMPLANT
GLOVE BIO SURGEON STRL SZ7 (GLOVE) ×5 IMPLANT
GLOVE BIO SURGEON STRL SZ7.5 (GLOVE) ×2 IMPLANT
GLOVE BIO SURGEONS STRL SZ 6.5 (GLOVE) ×1
GLOVE BIOGEL PI IND STRL 6.5 (GLOVE) IMPLANT
GLOVE BIOGEL PI INDICATOR 6.5 (GLOVE) ×6
GOWN STRL REUS W/ TWL LRG LVL3 (GOWN DISPOSABLE) ×2 IMPLANT
GOWN STRL REUS W/ TWL XL LVL3 (GOWN DISPOSABLE) ×1 IMPLANT
GOWN STRL REUS W/TWL LRG LVL3 (GOWN DISPOSABLE) ×6
GOWN STRL REUS W/TWL XL LVL3 (GOWN DISPOSABLE) ×3
KIT BASIN OR (CUSTOM PROCEDURE TRAY) ×3 IMPLANT
KIT DILATOR VASC 18G NDL (KITS) IMPLANT
KIT TURNOVER KIT B (KITS) IMPLANT
NDL HYPO 25GX1X1/2 BEV (NEEDLE) IMPLANT
NEEDLE HYPO 25GX1X1/2 BEV (NEEDLE) IMPLANT
PACK CIRCUIT ANGIOVAC GEN3 (MISCELLANEOUS) IMPLANT
PACK ENDO MINOR (CUSTOM PROCEDURE TRAY) ×3 IMPLANT
PAD ARMBOARD 7.5X6 YLW CONV (MISCELLANEOUS) ×6 IMPLANT
POSITIONER HEAD DONUT 9IN (MISCELLANEOUS) ×3 IMPLANT
PUMP SARN DELFIN (MISCELLANEOUS) ×2 IMPLANT
SET MICROPUNCTURE 5F STIFF (MISCELLANEOUS) IMPLANT
SHEATH DRYSEAL FLEX 18FR 33CM (SHEATH) IMPLANT
SHEATH DRYSEAL FLEX 26FR 33CM (SHEATH) IMPLANT
SHEATH PINNACLE 8F 10CM (SHEATH) ×6 IMPLANT
STOPCOCK 4 WAY LG BORE MALE ST (IV SETS) IMPLANT
SUT ETHIBOND X763 2 0 SH 1 (SUTURE) ×6 IMPLANT
SUT PROLENE 6 0 BV (SUTURE) IMPLANT
SUT SILK  1 MH (SUTURE)
SUT SILK 1 MH (SUTURE) IMPLANT
SYR BULB IRRIG 60ML STRL (SYRINGE) ×2 IMPLANT
SYR CONTROL 10ML LL (SYRINGE) IMPLANT
TAPE CLOTH SURG 4X10 WHT LF (GAUZE/BANDAGES/DRESSINGS) ×2 IMPLANT
TOWEL GREEN STERILE (TOWEL DISPOSABLE) ×6 IMPLANT
WATER STERILE IRR 1000ML POUR (IV SOLUTION) ×3 IMPLANT
WIRE AMPLATZ SS-J .035X180CM (WIRE) ×5 IMPLANT

## 2020-03-21 NOTE — Progress Notes (Signed)
[facility]  PROGRESS NOTE  Robert Kramer:[id number] DOB: January 03, 1976 DOA: 03/11/2020 PCP: Patient, No Pcp Per   Brief HPI:   44 year old male with history of polysubstance use including IV drug use, bipolar disorder came to ED for evaluation of abnormal blood cultures.  Patient was seen in this long ED on 03/06/2020 for evaluation management of heroin withdrawal.  He was treated supportively.  Blood culture was obtained and grew MRSA and Serratia marcescens.  He was called from home to come to ED for further evaluation.  CTA chest PE showed multiple cavitary and noncavitary nodules consistent with septic emboli, bilateral lower lobe pneumonia and bilateral hilar adenopathy felt to be reactive.  No pulmonary embolus was reported.  Patient was started on IV ceftriaxone and vancomycin.  TTE was performed which showed shaggy vegetations on tricuspid valve.  TEE on 03/15/2020 showed 2 x 2 cm long vegetation on tricuspid valve with resultant severe TR.  MRI left shoulder showed septic arthropathy, myositis and tenosynovitis.  He underwent washout of left shoulder by orthopedic surgery on 03/15/2020.  He was transferred to [facility] on 03/17/2020 for further evaluation of endocarditis by cardiothoracic surgery.    Subjective   Patient seen and examined, s/p angiovac debridement.   Assessment/Plan:     1. Severe sepsis secondary to MRSA and Serratia bacteremia-pulmonary septic emboli, TV endocarditis, left shoulder septic arthritis.  Patient is status post left shoulder arthroscopy with debridement irrigation for septic arthritis as per orthopedics on 03/15/2020.  Echocardiogram showed tricuspid vegetation.  TEE showed 2x2 mobile vegetation with severe TR.  MRI lumbar spine showed no evidence of epidural abscess.  Patient refused MRI right shoulder ordered by ID.  CT surgery was consulted and plan for angiogram debridement on 03/21/2020.  Infectious disease following.  Continue vancomycin, Rocephin was  changed to cefepime per ID.Marland Kitchen  Repeat blood cultures drawn on 9/8 are negative.  ID recommends 4 more weeks of antibiotic therapy with vancomycin and cefepime.  ID can be called back in 2 weeks to reassess whether he can be candidate for outpatient regimen. 2. 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  CT surgery following. 3. Hyponatremia-sodium is stable at 130.  Has been low since last month.  Serum osmolality is 281. 4. Substance use disorder-patient has ongoing IV heroin use, last use was 3 days prior to admission.  Patient started on Suboxone 8/2 mg twice daily with as needed Suboxone per opiate withdrawal protocol.  Check HIV and hepatitis C antibodies. 5. Thrombocytopenia-likely due to sepsis.  Platelet count has improved to 146,000. 6. Anemia-hemoglobin is 7.4, unclear etiology, patient was transfused 1 unit PRBC.  Today hemoglobin is 8.6.  Anemia panel obtained shows iron 25, saturation 13%, TIBC 195, B12 440, reticulocyte count percent 6.1, immature fraction 27.8.  Will check haptoglobin.     COVID-19 Labs  Recent Labs    03/20/20 1930  FERRITIN 453*    Lab Results  Component Value Date   SARSCOV2NAA NEGATIVE 03/11/2020     Scheduled medications:   . buprenorphine-naloxone  1 tablet Sublingual BID  . Chlorhexidine Gluconate Cloth  6 each Topical Daily  . feeding supplement (ENSURE ENLIVE)  237 mL Oral TID BM  . multivitamin with minerals  1 tablet Oral Daily  . sodium chloride flush  10-40 mL Intracatheter Q12H         CBG: No results for input(s): GLUCAP in the last 168 hours.  SpO2: 95 % O2 Flow Rate (L/min): 7  L/min    CBC: Recent Labs  Lab 03/16/20 0611 03/16/20 0611 03/16/20 1244 03/16/20 1244 03/17/20 0639 03/18/20 0335 03/19/20 0904 03/20/20 0500 03/21/20 0420  WBC 9.6   < > 12.5*   < > 11.8* 11.8* 10.5 11.8* 10.8*  NEUTROABS 7.5  --  9.9*  --  9.8* 9.1*  --   --   --   HGB 7.2*   < > 7.7*   < > 8.0* 7.3* 8.0*  7.4* 8.6*  HCT 21.6*   < > 22.5*   < > 25.4* 23.5* 24.9* 23.9* 27.5*  MCV 84.0   < > 81.2   < > 88.8 86.4 87.1 89.8 87.6  PLT 61*   < > 78*   < > 98* 112* 131* 146* 148*   < > = values in this interval not displayed.    Basic Metabolic Panel: Recent Labs  Lab 03/17/20 0639 03/17/20 0639 03/17/20 1058 03/18/20 0335 03/19/20 0904 03/20/20 0500 03/21/20 0420  NA 126*   < > 129* 130* 131* 130* 131*  K 4.1   < > 5.5* 4.1 3.6 4.1 4.1  CL 94*   < > 96* 97* 97* 97* 97*  CO2 26   < > 26 25 29 29 30   GLUCOSE 106*   < > 125* 124* 92 109* 112*  BUN 14   < > 16 11 11 12 12   CREATININE 0.54*   < > 0.59* 0.66 0.62 0.79 0.69  CALCIUM 7.9*   < > 8.0* 7.9* 8.2* 8.1* 8.3*  MG 1.8  --   --  1.8  --   --   --    < > = values in this interval not displayed.     Liver Function Tests: Recent Labs  Lab 03/17/20 0639 03/21/20 0420  AST 32 40  ALT 27 23  ALKPHOS 50 52  BILITOT 0.9 1.5*  PROT 6.3* 6.9  ALBUMIN 1.3* 1.1*     Antibiotics: Anti-infectives (From admission, onward)   Start     Dose/Rate Route Frequency Ordered Stop   03/20/20 1400  ceFEPIme (MAXIPIME) 2 g in sodium chloride 0.9 % 100 mL IVPB        2 g 200 mL/hr over 30 Minutes Intravenous Every 8 hours 03/20/20 0912 04/22/20 2359   03/19/20 2200  vancomycin (VANCOCIN) IVPB 1000 mg/200 mL premix        1,000 mg 200 mL/hr over 60 Minutes Intravenous Every 12 hours 03/19/20 1258 04/22/20 2359   03/18/20 1400  cefTRIAXone (ROCEPHIN) 2 g in sodium chloride 0.9 % 100 mL IVPB  Status:  Discontinued        2 g 200 mL/hr over 30 Minutes Intravenous Every 24 hours 03/18/20 1105 03/20/20 0909   03/12/20 2000  cefTRIAXone (ROCEPHIN) 2 g in sodium chloride 0.9 % 100 mL IVPB  Status:  Discontinued        2 g 200 mL/hr over 30 Minutes Intravenous Every 24 hours 03/11/20 2058 03/12/20 0937   03/12/20 1400  ceFEPIme (MAXIPIME) 2 g in sodium chloride 0.9 % 100 mL IVPB  Status:  Discontinued        2 g 200 mL/hr over 30 Minutes Intravenous  Every 8 hours 03/12/20 0937 03/18/20 1105   03/12/20 0000  vancomycin (VANCOCIN) IVPB 1000 mg/200 mL premix  Status:  Discontinued        1,000 mg 200 mL/hr over 60 Minutes Intravenous Every 8 hours 03/11/20 1816 03/19/20 1258   03/11/20 1830  cefTRIAXone (ROCEPHIN) 2 g in sodium chloride 0.9 % 100 mL IVPB        2 g 200 mL/hr over 30 Minutes Intravenous  Once 03/11/20 1822 03/11/20 2030   03/11/20 1545  vancomycin (VANCOREADY) IVPB 1500 mg/300 mL        1,500 mg 150 mL/hr over 120 Minutes Intravenous  Once 03/11/20 1532 03/11/20 1819       DVT prophylaxis: SCDs  Code Status: Full code  Family Communication: No family at bedside    Status is: Inpatient  Dispo: The patient is from: Home              Anticipated d/c is to: Home              Anticipated d/c date is: 03/25/2020              Patient currently not medically stable for discharge  Barrier to discharge-plan for angiogram procedure by CT surgery on 03/21/2020  Pressure Injury 03/19/20 Coccyx Mid Stage 2 -  Partial thickness loss of dermis presenting as a shallow open injury with a red, pink wound bed without slough. (Active)  03/19/20 2100  Location: Coccyx  Location Orientation: Mid  Staging: Stage 2 -  Partial thickness loss of dermis presenting as a shallow open injury with a red, pink wound bed without slough.  Wound Description (Comments):   Present on Admission: No         Consultants:  CT surgery  Procedures:     Objective   Vitals:   03/21/20 1155 03/21/20 1210 03/21/20 1225 03/21/20 1240  BP: 104/76 99/67 95/70  95/72  Pulse:      Resp: 18 14 (!) 22 13  Temp:      TempSrc:      SpO2:      Weight:      Height:        Intake/Output Summary (Last 24 hours) at 03/21/2020 1617 Last data filed at 03/21/2020 1351 Gross per 24 hour  Intake 3710.38 ml  Output 550 ml  Net 3160.38 ml    09/14 1901 - 09/16 0700 In: 1815.4 [P.O.:360] Out: 2000 [Urine:2000]  Filed Weights   03/18/20 0500  03/19/20 0543 03/21/20 0440  Weight: 64 kg 64 kg 64.2 kg    Physical Examination:    General-appears in no acute distress  Heart-S1-S2, regular, no murmur auscultated  Lungs-clear to auscultation bilaterally, no wheezing or crackles auscultated  Abdomen-soft, nontender, no organomegaly  Extremities-no edema in the lower extremities  Neuro-alert, oriented x3, no focal deficit noted    Data Reviewed:   Recent Results (from the past 240 hour(s))  SARS Coronavirus 2 by RT PCR (hospital order, performed in [facility] hospital lab) Nasopharyngeal Nasopharyngeal Swab     Status: None   Collection Time: 03/11/20  5:05 PM   Specimen: Nasopharyngeal Swab  Result Value Ref Range Status   SARS Coronavirus 2 NEGATIVE NEGATIVE Final    Comment: (NOTE) SARS-CoV-2 target nucleic acids are NOT DETECTED.  The SARS-CoV-2 RNA is generally detectable in upper and lower respiratory specimens during the acute phase of infection. The lowest concentration of SARS-CoV-2 viral copies this assay can detect is 250 copies / mL. A negative result does not preclude SARS-CoV-2 infection and should not be used as the sole basis for treatment or other patient management decisions.  A negative result may occur with improper specimen collection / handling, submission of specimen other than nasopharyngeal swab, presence of viral mutation(s) within the  areas targeted by this assay, and inadequate number of viral copies (<250 copies / mL). A negative result must be combined with clinical observations, patient history, and epidemiological information.  Fact Sheet for Patients:   BoilerBrush.com.cy  Fact Sheet for Healthcare Providers: https://pope.com/  This test is not yet approved or  cleared by the [place] FDA and has been authorized for detection and/or diagnosis of SARS-CoV-2 by FDA under an Emergency Use Authorization (EUA).  This EUA will  remain in effect (meaning this test can be used) for the duration of the COVID-19 declaration under Section 564(b)(1) of the Act, 21 U.S.C. section 360bbb-3(b)(1), unless the authorization is terminated or revoked sooner.  Performed at [facility], [address] [place], [place] [address]   Culture, blood (Routine X 2) w Reflex to ID Panel     Status: None   Collection Time: 03/13/20 11:09 AM   Specimen: BLOOD  Result Value Ref Range Status   Specimen Description   Final    BLOOD RIGHT ANTECUBITAL Performed at [facility], [address], [place], [place] [address]    Special Requests   Final    BOTTLES DRAWN AEROBIC AND ANAEROBIC Blood Culture adequate volume Performed at [facility], [address], [place], [place] [address]    Culture   Final    NO GROWTH 7 DAYS Performed at [facility], [address], [place], [place] [address]    Report Status 03/20/2020 FINAL  Final  Culture, blood (Routine X 2) w Reflex to ID Panel     Status: None   Collection Time: 03/13/20 11:10 AM   Specimen: BLOOD RIGHT HAND  Result Value Ref Range Status   Specimen Description   Final    BLOOD RIGHT HAND Performed at [facility], [address] [place], [place] [address]    Special Requests   Final    BOTTLES DRAWN AEROBIC ONLY Blood Culture results may not be optimal due to an inadequate volume of blood received in culture bottles Performed at [facility], [address], [place], [place] [address]    Culture   Final    NO GROWTH 7 DAYS Performed at [facility], [address], [place], [place] [address]    Report Status 03/20/2020 FINAL  Final  Surgical pcr screen     Status: None   Collection Time: 03/15/20 11:56 AM   Specimen: Nasal Mucosa; Nasal Swab  Result Value Ref Range Status   MRSA, PCR NEGATIVE NEGATIVE Final   Staphylococcus aureus NEGATIVE NEGATIVE Final     Comment: (NOTE) The Xpert SA Assay (FDA approved for NASAL specimens in patients 55 years of age and older), is one component of a comprehensive surveillance program. It is not intended to diagnose infection nor to guide or monitor treatment. Performed at [facility], [address], [place], [place] [address]   Aerobic/Anaerobic Culture (surgical/deep wound)     Status: None   Collection Time: 03/15/20  2:07 PM   Specimen: Synovial, Left Shoulder; Body Fluid  Result Value Ref Range Status   Specimen Description FLUID SYNOVIAL LEFT SHOULDER  Final   Special Requests NONE  Final   Gram Stain   Final    ABUNDANT WBC PRESENT, PREDOMINANTLY PMN RARE GRAM POSITIVE COCCI    Culture   Final    RARE METHICILLIN RESISTANT STAPHYLOCOCCUS AUREUS NO ANAEROBES ISOLATED Performed at [facility], [address] [place], [place] [address]    Report Status 03/20/2020 FINAL  Final   Organism ID, Bacteria METHICILLIN RESISTANT STAPHYLOCOCCUS AUREUS  Final      Susceptibility   Methicillin resistant staphylococcus aureus - MIC*    CIPROFLOXACIN <=0.5 SENSITIVE Sensitive     ERYTHROMYCIN >=8 RESISTANT Resistant     GENTAMICIN <=0.5 SENSITIVE Sensitive     OXACILLIN >=4 RESISTANT Resistant     TETRACYCLINE <=1 SENSITIVE Sensitive     VANCOMYCIN 1 SENSITIVE Sensitive     TRIMETH/SULFA <=10 SENSITIVE Sensitive     CLINDAMYCIN <=0.25 SENSITIVE Sensitive     RIFAMPIN <=0.5 SENSITIVE Sensitive     Inducible Clindamycin NEGATIVE Sensitive     * RARE METHICILLIN RESISTANT STAPHYLOCOCCUS AUREUS  Surgical pcr screen     Status: None   Collection Time: 03/21/20  3:37 AM   Specimen: Nasal Mucosa; Nasal Swab  Result Value Ref Range Status   MRSA, PCR NEGATIVE NEGATIVE Final   Staphylococcus aureus NEGATIVE NEGATIVE Final    Comment: (NOTE) The Xpert SA Assay (FDA approved for NASAL specimens in patients 46 years of age and older), is one component of a comprehensive surveillance program.  It is not intended to diagnose infection nor to guide or monitor treatment. Performed at [facility], [address] [place], [place] [address]   Aerobic/Anaerobic Culture (surgical/deep wound)     Status: None (Preliminary result)   Collection Time: 03/21/20  9:21 AM   Specimen: PATH Other; Tissue  Result Value Ref Range Status   Specimen Description TISSUE  Final   Special Requests VEGETATION OF TRICUSPID VALVE SPEC A  Final   Gram Stain   Final    FEW WBC PRESENT, PREDOMINANTLY PMN RARE GRAM POSITIVE COCCI Performed at [facility], [address] [place], [place] [address]    Culture PENDING  Incomplete   Report Status PENDING  Incomplete     Studies:  DG CHEST PORT 1 VIEW  Result Date: 03/21/2020 CLINICAL DATA:  Central line placement. EXAM: PORTABLE CHEST 1 VIEW COMPARISON:  Chest x-ray and chest CT 03/11/2020 FINDINGS: Right-sided PICC line tip is in good position with its tip in the region of the cavoatrial junction. Left IJ central venous catheter tip is in the mid SVC. The cardiac silhouette, mediastinal and hilar contours are within normal limits and stable. Progressive bilateral infiltrates and bilateral effusions. IMPRESSION: 1. Right-sided PICC line and left IJ central venous catheters in good position. 2. Progressive bilateral infiltrates and bilateral effusions. Electronically Signed   By: Rudie MeyerP.  Gallerani M.D.   On: 03/21/2020 11:14   DG C-Arm 1-60 Min-No Report  Result Date: 03/21/2020 Fluoroscopy was utilized by the requesting physician.  No radiographic interpretation.       Robert Kramer   [facility] If 7PM-7AM, please contact night-coverage at [email or web address], Office  [phone]   03/21/2020, 4:17 PM  LOS: 10 days

## 2020-03-21 NOTE — Op Note (Signed)
      [address]       [name] [address]             [phone]          05/[birth year]2019   Patient:  Robert Kramer Pre-Op Dx:     Tricuspid valve endocarditis   MRSA and Serratia bacteremia                         Sepsis                         Septic pulmonary emboli   Post-op Dx:  same Procedure: -Right femoral vein cannulation with a 18 F cannula - Right internal jugular vein cannulation with a 61F Sheath - Right heart cannulation - Debridement of right atrial mass - Debridement of tricuspid valve vegetation   Surgeon and Role:      * Dwight Adamczak, Eliezer Lofts, MD - Primary    Webb Laws, PA-C - assisting Anesthesia  general EBL:   100 ml Blood Administration:  None Specimen:  Tricuspid valve vegetation   Indications: The patient admitted to the hospital with tricuspid valve endocarditis and MRSA and Serratia bacteremia.  Due to ongoing drug use, the patient was not a good surgical candidate for valve replacement.  The patient did however have a large tricuspid valve vegetation and evidence of multiple septic pulmonary emboli.  Catheter-based debridement of the tricuspid valve vegetation was recommended.   Findings: Large mobile vegetation on the tricuspid valve and the right atrial wall.  We were able to debride much of the vegetation on the tricuspid valve but the component that was attached to the right atrial wall based and chronic in appearance.  The flimsy parts of the vegetation were debrided.   Operative Technique: After the risks, benefits and alternatives were thoroughly discussed, the patient was brought to the operative theatre.  Anesthesia was induced, and she was prepped and draped in normal sterile fashion.  An appropriate surgical pause was performed and preoperative antibiotics were dosed accordingly.   We began with ultrasound-guided cannulation of the right femoral vein using a micropuncture set.  We confirmed that our wire was in the IVC using  fluoroscopy.  After systemically heparinizing the patient, the venotomy was then sequentially dilated over wire and our 18 French catheter was in place.  This catheter was then connected to the angiovac circuit.   Next we moved to the right internal jugular vein.  Using ultrasound guidance and a micropuncture set we accessed the vein.  Wire was then threaded down the SVC into the heart and down into the abdominal IVC.  The tract was then dilated sequentially using fluoroscopy.  In the 26 [place] dry seal sheath was then inserted.  After we confirmed therapeutic ACT, the ECMO circuit was initiated and we used the Angiovac to debride the tricuspid valve with TEE guidance.   After achieving an optimal result we discontinued our procedure, and returned the remaining blood from the Angiovac circuit to the patient.  The catheters were removed and the sites were closed with a pledgeted mattress suture.  Pressure was held while heparinization was reversed with protamine.   The patient tolerated the procedure without any immediate complications, and was transferred to the PACU in stable condition.   Artyom Stencel Keane Scrape

## 2020-03-21 NOTE — Anesthesia Procedure Notes (Signed)
Arterial Line Insertion Performed by: Eilene Ghazi, MD, anesthesiologist  Patient location: Pre-op. Preanesthetic checklist: patient identified, site marked, risks and benefits discussed, monitors and equipment checked, pre-op evaluation and timeout performed Lidocaine 1% used for infiltration and patient sedated radial was placed Catheter size: 20 G  Attempts: 5 or more Procedure performed using ultrasound guided technique. Ultrasound Notes:anatomy identified, needle tip was noted to be adjacent to the nerve/plexus identified and no ultrasound evidence of intravascular and/or intraneural injection Following insertion, dressing applied and Biopatch. Post procedure assessment: normal  Patient tolerated the procedure well with no immediate complications.

## 2020-03-21 NOTE — Brief Op Note (Signed)
03/11/2020 - 03/21/2020  10:06 AM  PATIENT:  [name]  44 y.o. male  PRE-OPERATIVE DIAGNOSIS:  Tricuspid valve endocarditis  POST-OPERATIVE DIAGNOSIS:  Tricuspid valve endocarditis  PROCEDURE:  Procedure(s): APPLICATION OF ANGIOVAC (N/A)  SURGEON:  Surgeon(s) and Role:    * Lightfoot, Eliezer Lofts, MD - Primary  PHYSICIAN ASSISTANT: Hazleigh Mccleave PA-C  ASSISTANTS: STAFF   ANESTHESIA:   general  EBL:  50 ML   BLOOD ADMINISTERED:none  DRAINS: NONE   LOCAL MEDICATIONS USED:  NONE  SPECIMEN:  Source of Specimen:  ANGIOVAC  DISPOSITION OF SPECIMEN:  MICRO FOR CULTURES  COUNTS:  YES  TOURNIQUET:  * No tourniquets in log *  DICTATION: .Dragon Dictation  PLAN OF CARE: Admit to inpatient   PATIENT DISPOSITION:  PACU - hemodynamically stable.   Delay start of Pharmacological VTE agent (>24hrs) due to surgical blood loss or risk of bleeding: yes  COMPLICATIONS: NO KNOWN

## 2020-03-21 NOTE — Anesthesia Procedure Notes (Signed)
Central Venous Catheter Insertion Performed by: Eilene Ghazi, MD, anesthesiologist Start/End9/16/2021 7:52 AM, 03/21/2020 8:07 AM Patient location: OR. Preanesthetic checklist: patient identified, IV checked, site marked, risks and benefits discussed, surgical consent, monitors and equipment checked, pre-op evaluation, timeout performed and anesthesia consent Position: Trendelenburg Lidocaine 1% used for infiltration and patient sedated Hand hygiene performed  and maximum sterile barriers used  Catheter size: 8 Fr Total catheter length 16. Central line was placed.Double lumen Procedure performed using ultrasound guided technique. Ultrasound Notes:anatomy identified, needle tip was noted to be adjacent to the nerve/plexus identified, no ultrasound evidence of intravascular and/or intraneural injection and image(s) printed for medical record Attempts: 1 Following insertion, dressing applied, line sutured and Biopatch. Post procedure assessment: blood return through all ports, free fluid flow and no air  Patient tolerated the procedure well with no immediate complications.

## 2020-03-21 NOTE — Anesthesia Postprocedure Evaluation (Signed)
Anesthesia Post Note  Patient: Robert Kramer  Procedure(s) Performed: APPLICATION OF ANGIOVAC (N/A Chest)     Patient location during evaluation: PACU Anesthesia Type: General Level of consciousness: awake and alert Pain management: pain level controlled Vital Signs Assessment: post-procedure vital signs reviewed and stable Respiratory status: spontaneous breathing, nonlabored ventilation, respiratory function stable and patient connected to nasal cannula oxygen Cardiovascular status: blood pressure returned to baseline and stable Postop Assessment: no apparent nausea or vomiting Anesthetic complications: no   No complications documented.  Last Vitals:  Vitals:   03/21/20 1225 03/21/20 1240  BP: 95/70 95/72  Pulse:    Resp: (!) 22 13  Temp:    SpO2:      Last Pain:  Vitals:   03/21/20 1105  TempSrc:   PainSc: 0-No pain                 Garnet Chatmon S

## 2020-03-21 NOTE — Progress Notes (Signed)
[facility]  Date of Admission:  03/11/2020   Total days of antibiotics 10       Day 10 Vancomycin       Day 2 Cefipime         ASSESSMENT: Mr. Dorr has MRSA and Serratia bacteremia secondary to IV drug use, complicated by tricuspid valve endocarditis, septic pulmonary emboli, left shoulder septic arthritis and early lumbar infection. He underwent angio Vac procedure today.  Given his MRSA bacteremia and lumbar infection, patient will need a total of 6 weeks of antibiotic therapy with vancomycin and cefepime.  Patient agrees to stay in the hospital to complete the treatments.  We will reassess in 2 weeks for possible oritavancin utilization.  Hepatitis C screening remarkable for genotype 1b with RNA quant of 5 million.  Patient will be scheduled a follow-up appointment outpatient for treatment of HCV.  ID will sign off.  Thank you for allowing [place] to be part of the care team.  Please contact us for any questions or concerns.  PLAN: -Continue vancomycin and cefepime for 6 weeks total. End date 04/22/2020.  Will reassess in 2 weeks -Follow-up outpatient for HCV treatment  Principal Problem:   MRSA bacteremia Active Problems:   Serratia septicemia (HCC)   Arthritis, septic, shoulder (HCC)   Endocarditis of tricuspid valve   Hepatitis C virus infection   Lumbar back pain   Polysubstance dependence (HCC)   Hypokalemia   Thrombocytopenia (HCC)   Bipolar 1 disorder (HCC)   Heroin abuse (HCC)   IVDU (intravenous drug user)   Normocytic anemia   Cigarette smoker   Septic pulmonary embolism (HCC)   Pressure injury of skin   Scheduled Meds: . buprenorphine-naloxone  1 tablet Sublingual BID  . Chlorhexidine Gluconate Cloth  6 each Topical Daily  . feeding supplement (ENSURE ENLIVE)  237 mL Oral TID BM  . multivitamin with minerals  1 tablet Oral Daily  . sodium chloride flush  10-40 mL Intracatheter Q12H   Continuous Infusions: . sodium  chloride    . ceFEPime (MAXIPIME) IV 2 g (03/21/20 1436)  . vancomycin 1,000 mg (03/21/20 1158)   PRN Meds:.acetaminophen **OR** acetaminophen, hydrOXYzine, ketorolac, loperamide, methocarbamol, ondansetron **OR** ondansetron (ZOFRAN) IV, sodium chloride flush   SUBJECTIVE: Patient is seen at bedside today.  He just got back from angio [facility] procedure.  He is slightly drowsy but able to answer question appropriately.  Patient reports feeling well with no acute complaint.  He states that his lumbar pain also improved.  Review of Systems: Review of Systems  HENT: Negative.   Eyes: Negative.   Respiratory: Negative.   Cardiovascular: Negative.   Musculoskeletal: Positive for back pain (Improved lumbar pain) and joint pain (Improved left shoulder pain).  Skin: Negative.     Allergies  Allergen Reactions  . Tramadol Other (See Comments)    Upset stomach    OBJECTIVE: Vitals:   03/21/20 1155 03/21/20 1210 03/21/20 1225 03/21/20 1240  BP: 104/76 99/67 95/70  95/72  Pulse:      Resp: 18 14 (!) 22 13  Temp:      TempSrc:      SpO2:      Weight:      Height:       Body mass index is 19.74 kg/m.  Physical Exam Constitutional:      General: He is not in acute distress. HENT:     Head: Normocephalic.  Eyes:  General: No scleral icterus.       Right eye: No discharge.        Left eye: No discharge.  Cardiovascular:     Rate and Rhythm: Normal rate and regular rhythm.     Heart sounds: No murmur heard.   Pulmonary:     Breath sounds: Normal breath sounds. No wheezing.  Abdominal:     General: Bowel sounds are normal.  Musculoskeletal:     Right lower leg: No edema.     Left lower leg: No edema.  Skin:    Comments: PICC line on right arm Central line left IJ  Neurological:     Mental Status: He is alert.  Psychiatric:        Mood and Affect: Mood normal.     Lab Results Lab Results  Component Value Date   WBC 10.8 (H) 03/21/2020   HGB 8.6 (L) 03/21/2020    HCT 27.5 (L) 03/21/2020   MCV 87.6 03/21/2020   PLT 148 (L) 03/21/2020    Lab Results  Component Value Date   CREATININE 0.69 03/21/2020   BUN 12 03/21/2020   NA 131 (L) 03/21/2020   K 4.1 03/21/2020   CL 97 (L) 03/21/2020   CO2 30 03/21/2020    Lab Results  Component Value Date   ALT 23 03/21/2020   AST 40 03/21/2020   ALKPHOS 52 03/21/2020   BILITOT 1.5 (H) 03/21/2020     Microbiology: Recent Results (from the past 240 hour(s))  Urine culture     Status: Abnormal   Collection Time: 03/11/20  3:28 PM   Specimen: In/Out Cath Urine  Result Value Ref Range Status   Specimen Description   Final    IN/OUT CATH URINE Performed at [facility], [address], [place], [place] [address]    Special Requests   Final    NONE Performed at [facility], [address] [place], [place] [address]    Culture MULTIPLE SPECIES PRESENT, SUGGEST RECOLLECTION (A)  Final   Report Status 03/13/2020 FINAL  Final  Blood culture (routine x 2)     Status: Abnormal   Collection Time: 03/11/20  3:31 PM   Specimen: BLOOD  Result Value Ref Range Status   Specimen Description   Final    BLOOD LEFT ARM Performed at [facility], [address] [place], [place] [address]    Special Requests   Final    BOTTLES DRAWN AEROBIC AND ANAEROBIC Blood Culture results may not be optimal due to an excessive volume of blood received in culture bottles Performed at [facility], [address] [place], [place] [address]    Culture  Setup Time   Final    GRAM POSITIVE COCCI IN CLUSTERS IN BOTH AEROBIC AND ANAEROBIC BOTTLES CRITICAL VALUE NOTED.  VALUE IS CONSISTENT WITH PREVIOUSLY REPORTED AND CALLED VALUE.    Culture (A)  Final    STAPHYLOCOCCUS AUREUS SUSCEPTIBILITIES PERFORMED ON PREVIOUS CULTURE WITHIN THE LAST 5 DAYS. Performed at [facility], [address] [place], [place] [address]    Report Status 03/14/2020  FINAL  Final  SARS Coronavirus 2 by RT PCR (hospital order, performed in [facility] hospital lab) Nasopharyngeal Nasopharyngeal Swab     Status: None   Collection Time: 03/11/20  5:05 PM   Specimen: Nasopharyngeal Swab  Result Value Ref Range Status   SARS Coronavirus 2 NEGATIVE NEGATIVE Final    Comment: (NOTE) SARS-CoV-2  target nucleic acids are NOT DETECTED.  The SARS-CoV-2 RNA is generally detectable in upper and lower respiratory specimens during the acute phase of infection. The lowest concentration of SARS-CoV-2 viral copies this assay can detect is 250 copies / mL. A negative result does not preclude SARS-CoV-2 infection and should not be used as the sole basis for treatment or other patient management decisions.  A negative result may occur with improper specimen collection / handling, submission of specimen other than nasopharyngeal swab, presence of viral mutation(s) within the areas targeted by this assay, and inadequate number of viral copies (<250 copies / mL). A negative result must be combined with clinical observations, patient history, and epidemiological information.  Fact Sheet for Patients:   BoilerBrush.com.cyhttps://www.fda.gov/media/136312/download  Fact Sheet for Healthcare Providers: https://pope.com/https://www.fda.gov/media/136313/download  This test is not yet approved or  cleared by the [place] FDA and has been authorized for detection and/or diagnosis of SARS-CoV-2 by FDA under an Emergency Use Authorization (EUA).  This EUA will remain in effect (meaning this test can be used) for the duration of the COVID-19 declaration under Section 564(b)(1) of the Act, 21 U.S.C. section 360bbb-3(b)(1), unless the authorization is terminated or revoked sooner.  Performed at [facility], [address] [place], [place] [address]   Culture, blood (Routine X 2) w Reflex to ID Panel     Status: None   Collection Time: 03/13/20 11:09 AM   Specimen: BLOOD  Result Value  Ref Range Status   Specimen Description   Final    BLOOD RIGHT ANTECUBITAL Performed at [facility], [address] [place], [place] [address]    Special Requests   Final    BOTTLES DRAWN AEROBIC AND ANAEROBIC Blood Culture adequate volume Performed at [facility], [address] [place], [place] [address]    Culture   Final    NO GROWTH 7 DAYS Performed at [facility], [address] [place], [place] [address]    Report Status 03/20/2020 FINAL  Final  Culture, blood (Routine X 2) w Reflex to ID Panel     Status: None   Collection Time: 03/13/20 11:10 AM   Specimen: BLOOD RIGHT HAND  Result Value Ref Range Status   Specimen Description   Final    BLOOD RIGHT HAND Performed at [facility], [address] [place], [place] [address]    Special Requests   Final    BOTTLES DRAWN AEROBIC ONLY Blood Culture results may not be optimal due to an inadequate volume of blood received in culture bottles Performed at [facility], [address] [place], [place] [address]    Culture   Final    NO GROWTH 7 DAYS Performed at [facility], [address] [place], [place] [address]    Report Status 03/20/2020 FINAL  Final  Surgical pcr screen     Status: None   Collection Time: 03/15/20 11:56 AM   Specimen: Nasal Mucosa; Nasal Swab  Result Value Ref Range Status   MRSA, PCR NEGATIVE NEGATIVE Final   Staphylococcus aureus NEGATIVE NEGATIVE Final    Comment: (NOTE) The Xpert SA Assay (FDA approved for NASAL specimens in patients 46 years of age and older), is one component of a comprehensive surveillance program. It is not intended to diagnose infection nor to guide or monitor treatment. Performed at [facility], [address] [place], [place] [address]   Aerobic/Anaerobic Culture (surgical/deep wound)  Status: None   Collection Time: 03/15/20  2:07 PM   Specimen: Synovial, Left  Shoulder; Body Fluid  Result Value Ref Range Status   Specimen Description FLUID SYNOVIAL LEFT SHOULDER  Final   Special Requests NONE  Final   Gram Stain   Final    ABUNDANT WBC PRESENT, PREDOMINANTLY PMN RARE GRAM POSITIVE COCCI    Culture   Final    RARE METHICILLIN RESISTANT STAPHYLOCOCCUS AUREUS NO ANAEROBES ISOLATED Performed at [facility], [address], [place], [place] [address]    Report Status 03/20/2020 FINAL  Final   Organism ID, Bacteria METHICILLIN RESISTANT STAPHYLOCOCCUS AUREUS  Final      Susceptibility   Methicillin resistant staphylococcus aureus - MIC*    CIPROFLOXACIN <=0.5 SENSITIVE Sensitive     ERYTHROMYCIN >=8 RESISTANT Resistant     GENTAMICIN <=0.5 SENSITIVE Sensitive     OXACILLIN >=4 RESISTANT Resistant     TETRACYCLINE <=1 SENSITIVE Sensitive     VANCOMYCIN 1 SENSITIVE Sensitive     TRIMETH/SULFA <=10 SENSITIVE Sensitive     CLINDAMYCIN <=0.25 SENSITIVE Sensitive     RIFAMPIN <=0.5 SENSITIVE Sensitive     Inducible Clindamycin NEGATIVE Sensitive     * RARE METHICILLIN RESISTANT STAPHYLOCOCCUS AUREUS  Surgical pcr screen     Status: None   Collection Time: 03/21/20  3:37 AM   Specimen: Nasal Mucosa; Nasal Swab  Result Value Ref Range Status   MRSA, PCR NEGATIVE NEGATIVE Final   Staphylococcus aureus NEGATIVE NEGATIVE Final    Comment: (NOTE) The Xpert SA Assay (FDA approved for NASAL specimens in patients 4 years of age and older), is one component of a comprehensive surveillance program. It is not intended to diagnose infection nor to guide or monitor treatment. Performed at [facility], [address], [place], [place] [address]   Aerobic/Anaerobic Culture (surgical/deep wound)     Status: None (Preliminary result)   Collection Time: 03/21/20  9:21 AM   Specimen: PATH Other; Tissue  Result Value Ref Range Status   Specimen Description TISSUE  Final   Special Requests VEGETATION OF TRICUSPID VALVE SPEC A  Final   Gram  Stain   Final    FEW WBC PRESENT, PREDOMINANTLY PMN RARE GRAM POSITIVE COCCI Performed at [facility], [address], [place], [place] [address]    Culture PENDING  Incomplete   Report Status PENDING  Incomplete    Doran Stabler, [facility] [phone] pager   03/21/2020, 3:24 PM

## 2020-03-21 NOTE — Transfer of Care (Signed)
Immediate Anesthesia Transfer of Care Note  Patient: Robert Kramer  Procedure(s) Performed: APPLICATION OF ANGIOVAC (N/A Chest)  Patient Location: PACU  Anesthesia Type:General  Level of Consciousness: awake, alert  and oriented  Airway & Oxygen Therapy: Patient Spontanous Breathing and Patient connected to face mask oxygen  Post-op Assessment: Report given to RN, Post -op Vital signs reviewed and stable and Patient moving all extremities X 4  Post vital signs: Reviewed and stable  Last Vitals:  Vitals Value Taken Time  BP 110/72 03/21/20 1017  Temp    Pulse 101 03/21/20 1025  Resp 17 03/21/20 1025  SpO2 99 % 03/21/20 1025  Vitals shown include unvalidated device data.  Last Pain:  Vitals:   03/21/20 1017  TempSrc:   PainSc: (P) 0-No pain      Patients Stated Pain Goal: 2 (03/20/20 1028)  Complications: No complications documented.

## 2020-03-21 NOTE — Anesthesia Procedure Notes (Signed)
Anesthesia Procedure Image    

## 2020-03-21 NOTE — Anesthesia Preprocedure Evaluation (Signed)
Anesthesia Evaluation  Patient identified by MRN, date of birth, ID band Patient awake    Reviewed: Allergy & Precautions, NPO status , Patient's Chart, lab work & pertinent test results  Airway Mallampati: II  TM Distance: >3 FB Neck ROM: Full    Dental no notable dental hx.    Pulmonary neg pulmonary ROS, Current Smoker and Patient abstained from smoking.,    Pulmonary exam normal breath sounds clear to auscultation       Cardiovascular Normal cardiovascular exam+ Valvular Problems/Murmurs  Rhythm:Regular Rate:Normal  Tricuspid valve endocarditis   Neuro/Psych negative neurological ROS  negative psych ROS   GI/Hepatic negative GI ROS, Neg liver ROS,   Endo/Other  negative endocrine ROS  Renal/GU negative Renal ROS  negative genitourinary   Musculoskeletal negative musculoskeletal ROS (+)   Abdominal   Peds negative pediatric ROS (+)  Hematology negative hematology ROS (+) anemia ,   Anesthesia Other Findings   Reproductive/Obstetrics negative OB ROS                             Anesthesia Physical Anesthesia Plan  ASA: III  Anesthesia Plan: General   Post-op Pain Management:    Induction: Intravenous  PONV Risk Score and Plan: 1 and Ondansetron, Dexamethasone and Treatment may vary due to age or medical condition  Airway Management Planned: Oral ETT  Additional Equipment:   Intra-op Plan:   Post-operative Plan: Extubation in OR  Informed Consent: I have reviewed the patients History and Physical, chart, labs and discussed the procedure including the risks, benefits and alternatives for the proposed anesthesia with the patient or authorized representative who has indicated his/her understanding and acceptance.     Dental advisory given  Plan Discussed with: CRNA and Surgeon  Anesthesia Plan Comments:         Anesthesia Quick Evaluation

## 2020-03-21 NOTE — Progress Notes (Signed)
     [address]       [place] [address]             [phone]        No events Vitals:   03/21/20 0210 03/21/20 0440  BP: 116/76 106/74  Pulse: (!) 103 (!) 102  Resp: 16 15  Temp: 99.6 F (37.6 C) 98 F (36.7 C)  SpO2: 93% 94%   Transfused 1 unit overnight  OR today for angiovac debridement of tricuspid valve  Greta Yung O Moksha Dorgan

## 2020-03-21 NOTE — Progress Notes (Signed)
Subjective: Patient briefly examined prior to transport down to OR. States left shoulder is still sore though improved since admission. He has been working on OT exercises.    Objective:  PE: VITALS:   Vitals:   03/21/20 1155 03/21/20 1210 03/21/20 1225 03/21/20 1240  BP: 104/76 99/67 95/70  95/72  Pulse:      Resp: 18 14 (!) 22 13  Temp:      TempSrc:      SpO2:      Weight:      Height:       General: laying in bed, in no acute distress MSK: 0-30 degrees AROM of left shoulder. Mild TTP diffusely over shoulder. Bandages in place. Full ROM through left elbow, wrist, and all fingers of left hand. Distal sensation intact.   LABS  Results for orders placed or performed during the hospital encounter of 03/11/20 (from the past 24 hour(s))  Prepare RBC (crossmatch)     Status: None   Collection Time: 03/20/20  6:07 PM  Result Value Ref Range   Order Confirmation      ORDER PROCESSED BY BLOOD BANK Performed at [facility], [address] [place], [place] [address]   Osmolality     Status: None   Collection Time: 03/20/20  7:30 PM  Result Value Ref Range   Osmolality 281 275 - 295 mOsm/kg  Vitamin B12     Status: None   Collection Time: 03/20/20  7:30 PM  Result Value Ref Range   Vitamin B-12 440 180 - 914 pg/mL  Folate     Status: None   Collection Time: 03/20/20  7:30 PM  Result Value Ref Range   Folate 15.4 >5.9 ng/mL  Iron and TIBC     Status: Abnormal   Collection Time: 03/20/20  7:30 PM  Result Value Ref Range   Iron 25 (L) 45 - 182 ug/dL   TIBC 322 (L) 025 - 427 ug/dL   Saturation Ratios 13 (L) 17.9 - 39.5 %   UIBC 170 ug/dL  Ferritin     Status: Abnormal   Collection Time: 03/20/20  7:30 PM  Result Value Ref Range   Ferritin 453 (H) 24 - 336 ng/mL  Reticulocytes     Status: Abnormal   Collection Time: 03/20/20  7:30 PM  Result Value Ref Range   Retic Ct Pct 6.1 (H) 0.4 - 3.1 %   RBC. 2.77 (L) 4.22 - 5.81 MIL/uL   Retic Count, Absolute 168.7  19.0 - 186.0 K/uL   Immature Retic Fract 27.8 (H) 2.3 - 15.9 %  Type and screen     Status: None (Preliminary result)   Collection Time: 03/20/20  8:25 PM  Result Value Ref Range   ABO/RH(D) O POS    Antibody Screen NEG    Sample Expiration 03/23/2020,2359    Unit Number [id number]    Blood Component Type RED CELLS,LR    Unit division 00    Status of Unit ISSUED,FINAL    Transfusion Status OK TO TRANSFUSE    Crossmatch Result Compatible    Unit Number [id number]    Blood Component Type RED CELLS,LR    Unit division 00    Status of Unit ALLOCATED    Transfusion Status OK TO TRANSFUSE    Crossmatch Result      Compatible Performed at [facility], [address] [place], [place] [address]    Unit Number [id number]    Blood Component  Type RED CELLS,LR    Unit division 00    Status of Unit ALLOCATED    Transfusion Status OK TO TRANSFUSE    Crossmatch Result Compatible    Unit Number [id number]    Blood Component Type RED CELLS,LR    Unit division 00    Status of Unit ALLOCATED    Transfusion Status OK TO TRANSFUSE    Crossmatch Result Compatible    Unit Number [id number]    Blood Component Type RED CELLS,LR    Unit division 00    Status of Unit ALLOCATED    Transfusion Status OK TO TRANSFUSE    Crossmatch Result Compatible   Sodium, urine, random     Status: None   Collection Time: 03/20/20  9:42 PM  Result Value Ref Range   Sodium, Ur 103 mmol/L  Surgical pcr screen     Status: None   Collection Time: 03/21/20  3:37 AM   Specimen: Nasal Mucosa; Nasal Swab  Result Value Ref Range   MRSA, PCR NEGATIVE NEGATIVE   Staphylococcus aureus NEGATIVE NEGATIVE  CBC     Status: Abnormal   Collection Time: 03/21/20  4:20 AM  Result Value Ref Range   WBC 10.8 (H) 4.0 - 10.5 K/uL   RBC 3.14 (L) 4.22 - 5.81 MIL/uL   Hemoglobin 8.6 (L) 13.0 - 17.0 g/dL   HCT 42.6 (L) 39 - 52 %   MCV 87.6 80.0 - 100.0 fL   MCH 27.4 26.0 - 34.0 pg   MCHC 31.3 30.0 - 36.0  g/dL   RDW 83.4 (H) 19.6 - 22.2 %   Platelets 148 (L) 150 - 400 K/uL   nRBC 0.0 0.0 - 0.2 %  Comprehensive metabolic panel     Status: Abnormal   Collection Time: 03/21/20  4:20 AM  Result Value Ref Range   Sodium 131 (L) 135 - 145 mmol/L   Potassium 4.1 3.5 - 5.1 mmol/L   Chloride 97 (L) 98 - 111 mmol/L   CO2 30 22 - 32 mmol/L   Glucose, Bld 112 (H) 70 - 99 mg/dL   BUN 12 6 - 20 mg/dL   Creatinine, Ser 9.79 0.61 - 1.24 mg/dL   Calcium 8.3 (L) 8.9 - 10.3 mg/dL   Total Protein 6.9 6.5 - 8.1 g/dL   Albumin 1.1 (L) 3.5 - 5.0 g/dL   AST 40 15 - 41 U/L   ALT 23 0 - 44 U/L   Alkaline Phosphatase 52 38 - 126 U/L   Total Bilirubin 1.5 (H) 0.3 - 1.2 mg/dL   GFR calc non Af Amer >60 >60 mL/min   GFR calc Af Amer >60 >60 mL/min   Anion gap 4 (L) 5 - 15  Prepare RBC (crossmatch)     Status: None   Collection Time: 03/21/20  7:31 AM  Result Value Ref Range   Order Confirmation      ORDER PROCESSED BY BLOOD BANK Performed at [facility], [address] [place], [place] [address]   Prepare RBC (crossmatch)     Status: None   Collection Time: 03/21/20  7:31 AM  Result Value Ref Range   Order Confirmation      ORDER PROCESSED BY BLOOD BANK Performed at [facility], [address] [place], [place] [address]   POCT Activated clotting time     Status: None   Collection Time: 03/21/20  8:16 AM  Result Value Ref Range   Activated Clotting Time 142 seconds  POCT Activated clotting time     Status: None   Collection Time: 03/21/20  8:50 AM  Result Value Ref Range   Activated Clotting Time 252 seconds  POCT Activated clotting time     Status: None   Collection Time: 03/21/20  9:10 AM  Result Value Ref Range   Activated Clotting Time 263 seconds  POCT Activated clotting time     Status: None   Collection Time: 03/21/20  9:29 AM  Result Value Ref Range   Activated Clotting Time 230 seconds  POCT Activated clotting time     Status: None   Collection Time: 03/21/20  9:41 AM    Result Value Ref Range   Activated Clotting Time 263 seconds  POCT Activated clotting time     Status: None   Collection Time: 03/21/20  9:52 AM  Result Value Ref Range   Activated Clotting Time 147 seconds    DG CHEST PORT 1 VIEW  Result Date: 03/21/2020 CLINICAL DATA:  Central line placement. EXAM: PORTABLE CHEST 1 VIEW COMPARISON:  Chest x-ray and chest CT 03/11/2020 FINDINGS: Right-sided PICC line tip is in good position with its tip in the region of the cavoatrial junction. Left IJ central venous catheter tip is in the mid SVC. The cardiac silhouette, mediastinal and hilar contours are within normal limits and stable. Progressive bilateral infiltrates and bilateral effusions. IMPRESSION: 1. Right-sided PICC line and left IJ central venous catheters in good position. 2. Progressive bilateral infiltrates and bilateral effusions. Electronically Signed   By: Rudie Meyer M.D.   On: 03/21/2020 11:14   DG C-Arm 1-60 Min-No Report  Result Date: 03/21/2020 Fluoroscopy was utilized by the requesting physician.  No radiographic interpretation.    Assessment/Plan:  L shoulder septic arthritis: - 6 days post-op L shoulder irrigation and debridement, ROM increasing and pain decreased slightly since procedure - will plan to change dressing again on Monday - continue PT/OT - WBAT as tolerated, can perform motion as tolerated from ortho standpoint   Will continue to follow along intermittently  Contact information:   Weekdays 8-5 Janine Ores, PA-C [phone] A fter hours and holidays please check [email or web address] for group call information for [facility]  Armida Sans 03/21/2020, 1:08 PM

## 2020-03-21 NOTE — Anesthesia Procedure Notes (Signed)
Procedure Name: Intubation Performed by: Marena Chancy, CRNA Pre-anesthesia Checklist: Patient identified, Emergency Drugs available, Suction available and Patient being monitored Patient Re-evaluated:Patient Re-evaluated prior to induction Oxygen Delivery Method: Circle System Utilized Preoxygenation: Pre-oxygenation with 100% oxygen Induction Type: IV induction Ventilation: Mask ventilation without difficulty Laryngoscope Size: Miller and 2 Grade View: Grade I Tube type: Oral Tube size: 8.0 mm Number of attempts: 1 Airway Equipment and Method: Stylet Placement Confirmation: ETT inserted through vocal cords under direct vision,  positive ETCO2 and breath sounds checked- equal and bilateral Tube secured with: Tape Dental Injury: Teeth and Oropharynx as per pre-operative assessment

## 2020-03-21 NOTE — Hospital Course (Addendum)
HPI:   44 year old male was transferred from [facility] for surgical evaluation of tricuspid valve endocarditis with MRSA and Serratia bacteremia he has a history of IV drug abuse and originally presented with worsening dyspnea, and left shoulder pain.  He was found to have a septic joint and underwent an I&D with orthopedic surgery of his left shoulder.  He continues to be dyspneic, and febrile.  Cross-sectional imaging shows septic emboli to the lung.  Most recent blood cultures have been negative.   Hospital Course:   On 03/21/2020, Robert Kramer underwent angiovac debridement for his tricuspid valve endocarditis by Dr. Cliffton Asters. He tolerated the procedure well and was transferred to the floor for continued care. He continued on his pre-op antibiotics regimen of maxipime and vancomycin.

## 2020-03-21 NOTE — Progress Notes (Signed)
  Echocardiogram Echocardiogram Transesophageal has been performed.  Burnard Hawthorne 03/21/2020, 8:32 AM

## 2020-03-22 ENCOUNTER — Encounter (HOSPITAL_COMMUNITY): Payer: Self-pay | Admitting: Thoracic Surgery (Cardiothoracic Vascular Surgery)

## 2020-03-22 DIAGNOSIS — A4101 Sepsis due to Methicillin susceptible Staphylococcus aureus: Secondary | ICD-10-CM

## 2020-03-22 LAB — CBC
HCT: 27.5 % — ABNORMAL LOW (ref 39.0–52.0)
Hemoglobin: 8.4 g/dL — ABNORMAL LOW (ref 13.0–17.0)
MCH: 27.4 pg (ref 26.0–34.0)
MCHC: 30.5 g/dL (ref 30.0–36.0)
MCV: 89.6 fL (ref 80.0–100.0)
Platelets: 136 10*3/uL — ABNORMAL LOW (ref 150–400)
RBC: 3.07 MIL/uL — ABNORMAL LOW (ref 4.22–5.81)
RDW: 17.1 % — ABNORMAL HIGH (ref 11.5–15.5)
WBC: 11.1 10*3/uL — ABNORMAL HIGH (ref 4.0–10.5)
nRBC: 0 % (ref 0.0–0.2)

## 2020-03-22 LAB — BASIC METABOLIC PANEL
Anion gap: 7 (ref 5–15)
BUN: 21 mg/dL — ABNORMAL HIGH (ref 6–20)
CO2: 29 mmol/L (ref 22–32)
Calcium: 8.6 mg/dL — ABNORMAL LOW (ref 8.9–10.3)
Chloride: 98 mmol/L (ref 98–111)
Creatinine, Ser: 0.64 mg/dL (ref 0.61–1.24)
GFR calc Af Amer: >60 mL/min (ref >60)
GFR calc non Af Amer: >60 mL/min (ref >60)
Glucose, Bld: 145 mg/dL — ABNORMAL HIGH (ref 70–99)
Potassium: 4.3 mmol/L (ref 3.5–5.1)
Sodium: 134 mmol/L — ABNORMAL LOW (ref 135–145)

## 2020-03-22 LAB — ACID FAST SMEAR (AFB, MYCOBACTERIA): Acid Fast Smear: NEGATIVE

## 2020-03-22 LAB — VANCOMYCIN, TROUGH: Vancomycin Tr: 14 ug/mL — ABNORMAL LOW (ref 15–20)

## 2020-03-22 MED ORDER — VANCOMYCIN HCL 1250 MG/250ML IV SOLN
1250.0000 mg | Freq: Two times a day (BID) | INTRAVENOUS | Status: DC
  Administered 2020-03-22 – 2020-04-01 (×20): 1250 mg via INTRAVENOUS
  Filled 2020-03-22 (×22): qty 250

## 2020-03-22 NOTE — Progress Notes (Signed)
Physical Therapy Treatment Patient Details Name: Robert Kramer MRN: [id number] DOB: 18-May-1976 Today's Date: 03/22/2020    History of Present Illness 44 y.o. 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     PT Comments    Pt's brother in room on entry, very encouraging for pt to work with therapy. Pt looks much better and is more participatory. Pt continues to be limited in safe mobility by decreased safety awareness in presence of generalized weakness and decreased endurance. Pt is currently supervision for bed mobility, min guard for transfers. Pt walked to door with min A for steadying, reports need to urinate and walked back into room to use urinal. Pt able to use urinal independently. Afterward, he returned to bed and was able to perform shoulder exercises. D/c plan remains appropriate. PT will continue to follow acutely.       Follow Up Recommendations  No PT follow up     Equipment Recommendations  None recommended by PT       Precautions / Restrictions Precautions Precautions: Fall Restrictions Other Position/Activity Restrictions: LUE WBAT, AROM as tolerated, able to lift arm    Mobility  Bed Mobility Overal bed mobility: Needs Assistance Bed Mobility: Supine to Sit;Sit to Supine     Supine to sit: Supervision Sit to supine: Supervision   General bed  mobility comments: vc for reducing neck strain with pushing up to seated  Transfers Overall transfer level: Needs assistance Equipment used: 1 person hand held assist Transfers: Sit to/from Stand Sit to Stand: Min guard         General transfer comment: min guard for safety, good power up and self steadying, vc for hand placement.   Ambulation/Gait Ambulation/Gait assistance: Min assist Gait Distance (Feet): 20 Feet Assistive device: Rolling walker (2 wheeled) Gait Pattern/deviations: Step-to pattern;Shuffle;Antalgic Gait velocity: slowed Gait velocity interpretation: <1.31 ft/sec, indicative of household ambulator General Gait Details: min A for steadying, vc for proximity to RW and upright posture      Balance Overall balance assessment: Needs assistance Sitting-balance support: No upper extremity supported;Feet supported Sitting balance-Leahy Scale: Fair     Standing balance support: Single extremity supported;During functional activity;No upper extremity supported Standing balance-Leahy Scale: Fair Standing balance comment: able to static stand to use urinal                             Cognition Arousal/Alertness: Awake/alert Behavior During Therapy: WFL for tasks assessed/performed;Flat affect Overall Cognitive Status: Within Functional Limits for tasks assessed                                 General Comments: poor understanding of ability to use L UE despite prior washout       Exercises General Exercises - Upper Extremity Elbow Extension: AROM;Left;Seated;10 reps Wrist Flexion: AROM;Left;Seated;10 reps Wrist Extension: AROM;Left;Seated;10 reps  Digit Composite Flexion: AROM;Left;10 reps Composite Extension: AROM;Left;10 reps    General Comments General comments (skin integrity, edema, etc.): HR 90-118 with ambulation       Pertinent Vitals/Pain Pain Assessment: No/denies pain           PT Goals (current goals can now be  found in the care plan section) Acute Rehab PT Goals Patient Stated Goal: To be able to use my left arm again. PT Goal Formulation: With patient Time For Goal Achievement: 03/30/20 Potential to Achieve Goals: Good Progress towards PT goals: Progressing toward goals    Frequency    Min 3X/week      PT Plan Equipment recommendations need to be updated       AM-PAC PT "6 Clicks" Mobility   Outcome Measure  Help needed turning from your back to your side while in a flat bed without using bedrails?: A Little Help needed moving from lying on your back to sitting on the side of a flat bed without using bedrails?: A Little Help needed moving to and from a bed to a chair (including a wheelchair)?: A Little Help needed standing up from a chair using your arms (e.g., wheelchair or bedside chair)?: A Little Help needed to walk in hospital room?: A Lot Help needed climbing 3-5 steps with a railing? : A Lot 6 Click Score: 16    End of Session   Activity Tolerance: Patient limited by pain Patient left: in bed;with call bell/phone within reach;with bed alarm set Nurse Communication: Mobility status PT Visit Diagnosis: Difficulty in walking, not elsewhere classified (R26.2);Other abnormalities of gait and mobility (R26.89)     Time: 5170-0174 PT Time Calculation (min) (ACUTE ONLY): 24 min  Charges:  $Gait Training: 8-22 mins $Therapeutic Activity: 8-22 mins                     Kurstin Dimarzo B. Beverely Risen PT, DPT Acute Rehabilitation Services Pager [phone] Office [phone]    [name] Fleet 03/22/2020, 2:26 PM

## 2020-03-22 NOTE — Progress Notes (Addendum)
[facility]  PROGRESS NOTE  ZAREK RELPH XVQ:[id number] DOB: 1976/02/22 DOA: 03/11/2020 PCP: Patient, No Pcp Per   Brief HPI:   44 year old male with history of polysubstance use including IV drug use, bipolar disorder came to ED for evaluation of abnormal blood cultures.  Patient was seen in this long ED on 03/06/2020 for evaluation management of heroin withdrawal.  He was treated supportively.  Blood culture was obtained and grew MRSA and Serratia marcescens.  He was called from home to come to ED for further evaluation.  CTA chest PE showed multiple cavitary and noncavitary nodules consistent with septic emboli, bilateral lower lobe pneumonia and bilateral hilar adenopathy felt to be reactive.  No pulmonary embolus was reported.  Patient was started on IV ceftriaxone and vancomycin.  TTE was performed which showed shaggy vegetations on tricuspid valve.  TEE on 03/15/2020 showed 2 x 2 cm long vegetation on tricuspid valve with resultant severe TR.  MRI left shoulder showed septic arthropathy, myositis and tenosynovitis.  He underwent washout of left shoulder by orthopedic surgery on 03/15/2020.  He was transferred to [facility] on 03/17/2020 for further evaluation of endocarditis by cardiothoracic surgery.    Subjective   Patient seen and examined, denies any complaints.  S/p angiovac debridement done on 03/21/2020.   Assessment/Plan:     1. Severe sepsis secondary to MRSA and Serratia bacteremia-pulmonary septic emboli, TV endocarditis, left shoulder septic arthritis.  Patient is status post left shoulder arthroscopy with debridement irrigation for septic arthritis as per orthopedics on 03/15/2020.  Echocardiogram showed tricuspid vegetation.  TEE showed 2x2 mobile vegetation with severe TR.  MRI lumbar spine showed no evidence of epidural abscess.  Patient refused MRI right shoulder ordered by ID.  CT surgery was consulted and plan for angiogram debridement on 03/21/2020.  Infectious disease  following.  Continue vancomycin, Rocephin was changed to cefepime per ID.Marland Kitchen  Repeat blood cultures drawn on 9/8 are negative.  ID recommends 4 more weeks of antibiotic therapy with vancomycin and cefepime.  ID can be called back in 2 weeks to reassess whether he can be candidate for outpatient regimen. 2. 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  CT surgery following. 3. Hyponatremia-sodium is stable at 130.  Has been low since last month.  Serum osmolality is 281. 4. Substance use disorder-patient has ongoing IV heroin use, last use was 3 days prior to admission.  Patient started on Suboxone 8/2 mg twice daily with as needed Suboxone per opiate withdrawal protocol.  Check HIV and hepatitis C antibodies. 5. Thrombocytopenia-likely due to sepsis.  Platelet count has improved to 146,000. 6. Anemia-hemoglobin is 8.4 unclear etiology, patient was transfused 1 unit PRBC.  Today hemoglobin is 8.6.  Anemia panel obtained shows iron 25, saturation 13%, TIBC 195, B12 440, reticulocyte count percent 6.1, immature fraction 27.8.  Haptoglobin has been ordered.     COVID-19 Labs  Recent Labs    03/20/20 1930  FERRITIN 453*    Lab Results  Component Value Date   SARSCOV2NAA NEGATIVE 03/11/2020     Scheduled medications:   . buprenorphine-naloxone  1 tablet Sublingual BID  . Chlorhexidine Gluconate Cloth  6 each Topical Daily  . feeding supplement (ENSURE ENLIVE)  237 mL Oral TID BM  . multivitamin with minerals  1 tablet Oral Daily  . sodium chloride flush  10-40 mL Intracatheter Q12H         CBG: No results for input(s): GLUCAP in the last 168 hours.  SpO2:  99 % O2 Flow Rate (L/min): 7 L/min    CBC: Recent Labs  Lab 03/16/20 0611 03/16/20 0611 03/16/20 1244 03/16/20 1244 03/17/20 0639 03/17/20 0639 03/18/20 0335 03/19/20 0904 03/20/20 0500 03/21/20 0420 03/22/20 0500  WBC 9.6   < > 12.5*   < > 11.8*   < > 11.8* 10.5 11.8* 10.8* 11.1*  NEUTROABS  7.5  --  9.9*  --  9.8*  --  9.1*  --   --   --   --   HGB 7.2*   < > 7.7*   < > 8.0*   < > 7.3* 8.0* 7.4* 8.6* 8.4*  HCT 21.6*   < > 22.5*   < > 25.4*   < > 23.5* 24.9* 23.9* 27.5* 27.5*  MCV 84.0   < > 81.2   < > 88.8   < > 86.4 87.1 89.8 87.6 89.6  PLT 61*   < > 78*   < > 98*   < > 112* 131* 146* 148* 136*   < > = values in this interval not displayed.    Basic Metabolic Panel: Recent Labs  Lab 03/17/20 0639 03/17/20 1058 03/18/20 0335 03/19/20 0904 03/20/20 0500 03/21/20 0420 03/22/20 1144  NA 126*   < > 130* 131* 130* 131* 134*  K 4.1   < > 4.1 3.6 4.1 4.1 4.3  CL 94*   < > 97* 97* 97* 97* 98  CO2 26   < > 25 29 29 30 29   GLUCOSE 106*   < > 124* 92 109* 112* 145*  BUN 14   < > 11 11 12 12  21*  CREATININE 0.54*   < > 0.66 0.62 0.79 0.69 0.64  CALCIUM 7.9*   < > 7.9* 8.2* 8.1* 8.3* 8.6*  MG 1.8  --  1.8  --   --   --   --    < > = values in this interval not displayed.     Liver Function Tests: Recent Labs  Lab 03/17/20 0639 03/21/20 0420  AST 32 40  ALT 27 23  ALKPHOS 50 52  BILITOT 0.9 1.5*  PROT 6.3* 6.9  ALBUMIN 1.3* 1.1*     Antibiotics: Anti-infectives (From admission, onward)   Start     Dose/Rate Route Frequency Ordered Stop   03/20/20 1400  ceFEPIme (MAXIPIME) 2 g in sodium chloride 0.9 % 100 mL IVPB        2 g 200 mL/hr over 30 Minutes Intravenous Every 8 hours 03/20/20 0912 04/22/20 2359   03/19/20 2200  vancomycin (VANCOCIN) IVPB 1000 mg/200 mL premix        1,000 mg 200 mL/hr over 60 Minutes Intravenous Every 12 hours 03/19/20 1258 04/22/20 2359   03/18/20 1400  cefTRIAXone (ROCEPHIN) 2 g in sodium chloride 0.9 % 100 mL IVPB  Status:  Discontinued        2 g 200 mL/hr over 30 Minutes Intravenous Every 24 hours 03/18/20 1105 03/20/20 0909   03/12/20 2000  cefTRIAXone (ROCEPHIN) 2 g in sodium chloride 0.9 % 100 mL IVPB  Status:  Discontinued        2 g 200 mL/hr over 30 Minutes Intravenous Every 24 hours 03/11/20 2058 03/12/20 0937   03/12/20  1400  ceFEPIme (MAXIPIME) 2 g in sodium chloride 0.9 % 100 mL IVPB  Status:  Discontinued        2 g 200 mL/hr over 30 Minutes Intravenous Every 8 hours 03/12/20 0937 03/18/20 1105  03/12/20 0000  vancomycin (VANCOCIN) IVPB 1000 mg/200 mL premix  Status:  Discontinued        1,000 mg 200 mL/hr over 60 Minutes Intravenous Every 8 hours 03/11/20 1816 03/19/20 1258   03/11/20 1830  cefTRIAXone (ROCEPHIN) 2 g in sodium chloride 0.9 % 100 mL IVPB        2 g 200 mL/hr over 30 Minutes Intravenous  Once 03/11/20 1822 03/11/20 2030   03/11/20 1545  vancomycin (VANCOREADY) IVPB 1500 mg/300 mL        1,500 mg 150 mL/hr over 120 Minutes Intravenous  Once 03/11/20 1532 03/11/20 1819       DVT prophylaxis: SCDs  Code Status: Full code  Family Communication: No family at bedside    Status is: Inpatient  Dispo: The patient is from: Home              Anticipated d/c is to: Home              Anticipated d/c date is: 03/25/2020              Patient currently not medically stable for discharge  Barrier to discharge-plan for angiogram procedure by CT surgery on 03/21/2020  Pressure Injury 03/19/20 Coccyx Mid Stage 2 -  Partial thickness loss of dermis presenting as a shallow open injury with a red, pink wound bed without slough. (Active)  03/19/20 2100  Location: Coccyx  Location Orientation: Mid  Staging: Stage 2 -  Partial thickness loss of dermis presenting as a shallow open injury with a red, pink wound bed without slough.  Wound Description (Comments):   Present on Admission: No         Consultants:  CT surgery  Procedures:     Objective   Vitals:   03/22/20 0101 03/22/20 0501 03/22/20 0513 03/22/20 0900  BP:  106/80  (!) 125/93  Pulse:  85  96  Resp:    19  Temp: (!) 97.5 F (36.4 C) 97.6 F (36.4 C)  (!) 97.5 F (36.4 C)  TempSrc: Oral Oral  Axillary  SpO2:  98%  99%  Weight:   67.9 kg   Height:        Intake/Output Summary (Last 24 hours) at 03/22/2020  1422 Last data filed at 03/22/2020 1400 Gross per 24 hour  Intake 360 ml  Output 1075 ml  Net -715 ml    09/15 1901 - 09/17 0700 In: 4070.4 [P.O.:840; I.V.:1200] Out: 1350 [Urine:1350]  Filed Weights   03/19/20 0543 03/21/20 0440 03/22/20 0513  Weight: 64 kg 64.2 kg 67.9 kg    Physical Examination:   General-appears in no acute distress Heart-S1-S2, regular, no murmur auscultated Lungs-clear to auscultation bilaterally, no wheezing or crackles auscultated Abdomen-soft, nontender, no organomegaly Extremities-no edema in the lower extremities Neuro-alert, oriented x3, no focal deficit noted   Data Reviewed:   Recent Results (from the past 240 hour(s))  Culture, blood (Routine X 2) w Reflex to ID Panel     Status: None   Collection Time: 03/13/20 11:09 AM   Specimen: BLOOD  Result Value Ref Range Status   Specimen Description   Final    BLOOD RIGHT ANTECUBITAL Performed at [facility], [address], [place], [place] [address]    Special Requests   Final    BOTTLES DRAWN AEROBIC AND ANAEROBIC Blood Culture adequate volume Performed at [facility], [address] [place], [place] [address]    Culture   Final  NO GROWTH 7 DAYS Performed at [facility], [address], [place], [place] [address]    Report Status 03/20/2020 FINAL  Final  Culture, blood (Routine X 2) w Reflex to ID Panel     Status: None   Collection Time: 03/13/20 11:10 AM   Specimen: BLOOD RIGHT HAND  Result Value Ref Range Status   Specimen Description   Final    BLOOD RIGHT HAND Performed at [facility], [address], [place], [place] [address]    Special Requests   Final    BOTTLES DRAWN AEROBIC ONLY Blood Culture results may not be optimal due to an inadequate volume of blood received in culture bottles Performed at [facility], [address], [place], [place] [address]    Culture   Final    NO  GROWTH 7 DAYS Performed at [facility], [address], [place], [place] [address]    Report Status 03/20/2020 FINAL  Final  Surgical pcr screen     Status: None   Collection Time: 03/15/20 11:56 AM   Specimen: Nasal Mucosa; Nasal Swab  Result Value Ref Range Status   MRSA, PCR NEGATIVE NEGATIVE Final   Staphylococcus aureus NEGATIVE NEGATIVE Final    Comment: (NOTE) The Xpert SA Assay (FDA approved for NASAL specimens in patients 35 years of age and older), is one component of a comprehensive surveillance program. It is not intended to diagnose infection nor to guide or monitor treatment. Performed at [facility], [address] [place], [place] [address]   Aerobic/Anaerobic Culture (surgical/deep wound)     Status: None   Collection Time: 03/15/20  2:07 PM   Specimen: Synovial, Left Shoulder; Body Fluid  Result Value Ref Range Status   Specimen Description FLUID SYNOVIAL LEFT SHOULDER  Final   Special Requests NONE  Final   Gram Stain   Final    ABUNDANT WBC PRESENT, PREDOMINANTLY PMN RARE GRAM POSITIVE COCCI    Culture   Final    RARE METHICILLIN RESISTANT STAPHYLOCOCCUS AUREUS NO ANAEROBES ISOLATED Performed at [facility], [address] [place], [place] [address]    Report Status 03/20/2020 FINAL  Final   Organism ID, Bacteria METHICILLIN RESISTANT STAPHYLOCOCCUS AUREUS  Final      Susceptibility   Methicillin resistant staphylococcus aureus - MIC*    CIPROFLOXACIN <=0.5 SENSITIVE Sensitive     ERYTHROMYCIN >=8 RESISTANT Resistant     GENTAMICIN <=0.5 SENSITIVE Sensitive     OXACILLIN >=4 RESISTANT Resistant     TETRACYCLINE <=1 SENSITIVE Sensitive     VANCOMYCIN 1 SENSITIVE Sensitive     TRIMETH/SULFA <=10 SENSITIVE Sensitive     CLINDAMYCIN <=0.25 SENSITIVE Sensitive     RIFAMPIN <=0.5 SENSITIVE Sensitive     Inducible Clindamycin NEGATIVE Sensitive     * RARE METHICILLIN RESISTANT STAPHYLOCOCCUS AUREUS  Surgical pcr screen     Status:  None   Collection Time: 03/21/20  3:37 AM   Specimen: Nasal Mucosa; Nasal Swab  Result Value Ref Range Status   MRSA, PCR NEGATIVE NEGATIVE Final   Staphylococcus aureus NEGATIVE NEGATIVE Final    Comment: (NOTE) The Xpert SA Assay (FDA approved for NASAL specimens in patients 40 years of age and older), is one component of a comprehensive surveillance program. It is not intended to diagnose infection nor to guide or monitor treatment. Performed at [facility], [address] [place], [place] [address]   Aerobic/Anaerobic  Culture (surgical/deep wound)     Status: None (Preliminary result)   Collection Time: 03/21/20  9:21 AM   Specimen: PATH Other; Tissue  Result Value Ref Range Status   Specimen Description TISSUE  Final   Special Requests VEGETATION OF TRICUSPID VALVE SPEC A  Final   Gram Stain   Final    FEW WBC PRESENT, PREDOMINANTLY PMN RARE GRAM POSITIVE COCCI    Culture   Final    CULTURE REINCUBATED FOR BETTER GROWTH Performed at [facility], [address] [place], [place] [address]    Report Status PENDING  Incomplete     Studies:  DG CHEST PORT 1 VIEW  Result Date: 03/21/2020 CLINICAL DATA:  Central line placement. EXAM: PORTABLE CHEST 1 VIEW COMPARISON:  Chest x-ray and chest CT 03/11/2020 FINDINGS: Right-sided PICC line tip is in good position with its tip in the region of the cavoatrial junction. Left IJ central venous catheter tip is in the mid SVC. The cardiac silhouette, mediastinal and hilar contours are within normal limits and stable. Progressive bilateral infiltrates and bilateral effusions. IMPRESSION: 1. Right-sided PICC line and left IJ central venous catheters in good position. 2. Progressive bilateral infiltrates and bilateral effusions. Electronically Signed   By: Rudie Meyer M.D.   On: 03/21/2020 11:14   DG C-Arm 1-60 Min-No Report  Result Date: 03/21/2020 Fluoroscopy was utilized by the requesting physician.  No radiographic  interpretation.       Meredeth Ide   [facility] If 7PM-7AM, please contact night-coverage at [email or web address], Office  [phone]   03/22/2020, 2:22 PM  LOS: 11 days

## 2020-03-22 NOTE — Progress Notes (Addendum)
      [address]       [name] [address]             [phone]      1 Day Post-Op Procedure(s) (LRB): APPLICATION OF ANGIOVAC (N/A) Subjective: Awake and alert, offers no complaints.   Objective: Vital signs in last 24 hours: Temp:  [97.2 F (36.2 C)-97.8 F (36.6 C)] 97.6 F (36.4 C) (09/17 0501) Pulse Rate:  [81-110] 85 (09/17 0501) Cardiac Rhythm: Normal sinus rhythm (09/16 2000) Resp:  [11-23] 14 (09/16 2024) BP: (93-113)/(67-91) 106/80 (09/17 0501) SpO2:  [95 %-100 %] 98 % (09/17 0501) Weight:  [67.9 kg] 67.9 kg (09/17 0513)    Intake/Output from previous day: 09/16 0701 - 09/17 0700 In: 2915 [P.O.:840; I.V.:1200; IV Piggyback:800] Out: 1050 [Urine:1050] Intake/Output this shift: No intake/output data recorded.  General appearance: alert, cooperative and no distress Heart: RRR, I did not hear a murmur.  Lungs: Breath sounds are clear.  Wound: the right neck and right femoral vein cannulations sites are covered with dry dressings. No palpable hemotoma.   Lab Results: Recent Labs    03/21/20 0420 03/22/20 0500  WBC 10.8* 11.1*  HGB 8.6* 8.4*  HCT 27.5* 27.5*  PLT 148* 136*   BMET:  Recent Labs    03/20/20 0500 03/21/20 0420  NA 130* 131*  K 4.1 4.1  CL 97* 97*  CO2 29 30  GLUCOSE 109* 112*  BUN 12 12  CREATININE 0.79 0.69  CALCIUM 8.1* 8.3*    PT/INR: No results for input(s): LABPROT, INR in the last 72 hours. ABG    Component Value Date/Time   TCO2 27 04/01/2014 1528   CBG (last 3)  No results for input(s): GLUCAP in the last 72 hours.  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 BP and cardiac rhythm stable. OK to mobilize, may advance activity as tolerated. Will leave cannulation sutures in place for now, plan to remove on Monday.   LOS: 11 days    Leary Roca, [place] [phone] 03/22/2020  Agree  with above No events. We will remove stitches in neck and groin on Monday. Rest of care per primary.  Zion Lint Keane Scrape

## 2020-03-22 NOTE — Progress Notes (Signed)
Pharmacy Antibiotic Note  Robert Kramer is a 44 y.o. 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 Angiovac procedure is scheduled for 9/16. Additionally, patient has L shoulder septic arthritis s/p I&D 9/10 with culture growing rare MRSA. Pharmacy has been consulted for cefepime dosing for Serratia bacteremia/endocarditis.  As Serratia marcecens is an AmpC-producing organism, will switch from ceftriaxone to cefepime in order to reduce potential for AmpC beta-lactamase induction with ceftriaxone. There is limited data on clinical outcomes to support the use of third generation cephalosporins in AmpC-producers; however, in vitro and in vivo studies demonstrate that up to 30% of ceftriaxone-susceptible AmpC-producing isolates eventually are induced after third-generation cephalosporin monotherapy.  Afebrile overnight, wbc stable at 11, renal function stable and normal.   Vancomycin trough this morning was just below goal at 14, will increase vancomycin dose slightly to get within therapeutic range.   Plan: Continue cefepime 2g Q 8 hours Increase vancomycin to 1250 mg Q 12 hours  Monitor renal function and clinical signs/symptoms of infection   Height: 5\' 11"  (180.3 cm) Weight: 67.9 kg (149 lb 11.1 oz) IBW/kg (Calculated) : 75.3  Temp (24hrs), Avg:97.5 F (36.4 C), Min:97.2 F (36.2 C), Max:97.8 F (36.6 C)  Recent Labs  Lab 03/17/20 0639 03/17/20 1058 03/18/20 0335 03/19/20 0904 03/20/20 0500 03/21/20 0420 03/22/20 0500  WBC   < >  --  11.8* 10.5 11.8* 10.8* 11.1*  CREATININE  --  0.59* 0.66 0.62 0.79 0.69  --   VANCOTROUGH  --   --   --  28*  --   --   --    < > = values in this interval not displayed.    Estimated Creatinine Clearance: 110.8 mL/min (by C-G formula based on SCr of 0.69 mg/dL).    Allergies  Allergen Reactions  . Tramadol Other (See Comments)    Upset stomach   Antimicrobials this  admission: Vancomycin 9/6 >>  Rocephin 9/6 >>9/7, 9/13>>9/15 Cefepime 9/7 >>9/13, 9/15>>   Microbiology results: 9/1 BCx x1: MRSA 9/1 BCx x1: Serratia Marcescens (S= Bactrim, Cipro; R=Cefazolin) 9/6 UCx: mult sp F 9/6 BCx: MRSA F 9/6 COVID: neg 9/8 Hep A reactive 9/8 Hep B NR 9/8 Hep C reactive 9/8 BCx2 ngtd 9/10 MRSA PCR neg  9/10 L shoulder synovial fluid: rare MRSA 9/16 tissue/veg: GPC - reincubate  Thank you for allowing pharmacy to be a part of this patient's care.  10/16 PharmD., BCPS Clinical Pharmacist 03/22/2020 10:13 AM

## 2020-03-23 NOTE — Progress Notes (Signed)
[facility]  PROGRESS NOTE  Robert Kramer ION:[id number] DOB: Oct 06, 1975 DOA: 03/11/2020 PCP: Patient, No Pcp Per   Brief HPI:   44 year old male with history of polysubstance use including IV drug use, bipolar disorder came to ED for evaluation of abnormal blood cultures.  Patient was seen in this long ED on 03/06/2020 for evaluation management of heroin withdrawal.  He was treated supportively.  Blood culture was obtained and grew MRSA and Serratia marcescens.  He was called from home to come to ED for further evaluation.  CTA chest PE showed multiple cavitary and noncavitary nodules consistent with septic emboli, bilateral lower lobe pneumonia and bilateral hilar adenopathy felt to be reactive.  No pulmonary embolus was reported.  Patient was started on IV ceftriaxone and vancomycin.  TTE was performed which showed shaggy vegetations on tricuspid valve.  TEE on 03/15/2020 showed 2 x 2 cm long vegetation on tricuspid valve with resultant severe TR.  MRI left shoulder showed septic arthropathy, myositis and tenosynovitis.  He underwent washout of left shoulder by orthopedic surgery on 03/15/2020.  He was transferred to [facility] on 03/17/2020 for further evaluation of endocarditis by cardiothoracic surgery.    Subjective   Patient seen and examined, denies any complaints.   Assessment/Plan:     1. Severe sepsis secondary to MRSA and Serratia bacteremia-pulmonary septic emboli, TV endocarditis, left shoulder septic arthritis.  Patient is status post left shoulder arthroscopy with debridement irrigation for septic arthritis as per orthopedics on 03/15/2020.  Echocardiogram showed tricuspid vegetation.  TEE showed 2x2 mobile vegetation with severe TR.  MRI lumbar spine showed no evidence of epidural abscess.  Patient refused MRI right shoulder ordered by ID.  CT surgery was consulted and plan for angiovac debridement on 03/21/2020.  Infectious disease following.  Continue vancomycin, Rocephin was  changed to cefepime per ID.Marland Kitchen  Repeat blood cultures drawn on 9/8 are negative.  ID recommends 4 more weeks of antibiotic therapy starting from 03/21/2020 with vancomycin and cefepime till 04/18/2020.  ID can be called back in 2 weeks to reassess whether he can be candidate for outpatient regimen. 2. 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  CT surgery following. 3. Hyponatremia-sodium is stable at 130.  Has been low since last month.  Serum osmolality is 281. 4. Substance use disorder-patient has ongoing IV heroin use, last use was 3 days prior to admission.  Patient started on Suboxone 8/2 mg twice daily with as needed Suboxone per opiate withdrawal protocol.  HIV antibodies were nonreactive, hepatitis C antibodies were reactive.  Hepatitis C genotype 1b.  Patient can follow-up with GI as outpatient for hepatitis C. 5. Thrombocytopenia-likely due to sepsis.  Platelet count has improved to 146,000. 6. Anemia-hemoglobin is 8.4 unclear etiology, patient was transfused 1 unit PRBC.  Today hemoglobin is 8.4.  Anemia panel obtained shows iron 25, saturation 13%, TIBC 195, B12 440, reticulocyte count percent 6.1, immature fraction 27.8.  Haptoglobin has been ordered.     COVID-19 Labs  Recent Labs    03/20/20 1930  FERRITIN 453*    Lab Results  Component Value Date   SARSCOV2NAA NEGATIVE 03/11/2020     Scheduled medications:   . buprenorphine-naloxone  1 tablet Sublingual BID  . Chlorhexidine Gluconate Cloth  6 each Topical Daily  . feeding supplement (ENSURE ENLIVE)  237 mL Oral TID BM  . multivitamin with minerals  1 tablet Oral Daily  . sodium chloride flush  10-40 mL Intracatheter Q12H  CBG: No results for input(s): GLUCAP in the last 168 hours.  SpO2: 97 % O2 Flow Rate (L/min): 7 L/min    CBC: Recent Labs  Lab 03/17/20 0639 03/17/20 0639 03/18/20 0335 03/19/20 0904 03/20/20 0500 03/21/20 0420 03/22/20 0500  WBC 11.8*   < > 11.8*  10.5 11.8* 10.8* 11.1*  NEUTROABS 9.8*  --  9.1*  --   --   --   --   HGB 8.0*   < > 7.3* 8.0* 7.4* 8.6* 8.4*  HCT 25.4*   < > 23.5* 24.9* 23.9* 27.5* 27.5*  MCV 88.8   < > 86.4 87.1 89.8 87.6 89.6  PLT 98*   < > 112* 131* 146* 148* 136*   < > = values in this interval not displayed.    Basic Metabolic Panel: Recent Labs  Lab 03/17/20 0639 03/17/20 1058 03/18/20 0335 03/19/20 0904 03/20/20 0500 03/21/20 0420 03/22/20 1144  NA 126*   < > 130* 131* 130* 131* 134*  K 4.1   < > 4.1 3.6 4.1 4.1 4.3  CL 94*   < > 97* 97* 97* 97* 98  CO2 26   < > 25 29 29 30 29   GLUCOSE 106*   < > 124* 92 109* 112* 145*  BUN 14   < > 11 11 12 12  21*  CREATININE 0.54*   < > 0.66 0.62 0.79 0.69 0.64  CALCIUM 7.9*   < > 7.9* 8.2* 8.1* 8.3* 8.6*  MG 1.8  --  1.8  --   --   --   --    < > = values in this interval not displayed.     Liver Function Tests: Recent Labs  Lab 03/17/20 0639 03/21/20 0420  AST 32 40  ALT 27 23  ALKPHOS 50 52  BILITOT 0.9 1.5*  PROT 6.3* 6.9  ALBUMIN 1.3* 1.1*     Antibiotics: Anti-infectives (From admission, onward)   Start     Dose/Rate Route Frequency Ordered Stop   03/22/20 2000  vancomycin (VANCOREADY) IVPB 1250 mg/250 mL        1,250 mg 166.7 mL/hr over 90 Minutes Intravenous Every 12 hours 03/22/20 1435 04/23/20 0759   03/20/20 1400  ceFEPIme (MAXIPIME) 2 g in sodium chloride 0.9 % 100 mL IVPB        2 g 200 mL/hr over 30 Minutes Intravenous Every 8 hours 03/20/20 0912 04/22/20 2359   03/19/20 2200  vancomycin (VANCOCIN) IVPB 1000 mg/200 mL premix  Status:  Discontinued        1,000 mg 200 mL/hr over 60 Minutes Intravenous Every 12 hours 03/19/20 1258 03/22/20 1435   03/18/20 1400  cefTRIAXone (ROCEPHIN) 2 g in sodium chloride 0.9 % 100 mL IVPB  Status:  Discontinued        2 g 200 mL/hr over 30 Minutes Intravenous Every 24 hours 03/18/20 1105 03/20/20 0909   03/12/20 2000  cefTRIAXone (ROCEPHIN) 2 g in sodium chloride 0.9 % 100 mL IVPB  Status:   Discontinued        2 g 200 mL/hr over 30 Minutes Intravenous Every 24 hours 03/11/20 2058 03/12/20 0937   03/12/20 1400  ceFEPIme (MAXIPIME) 2 g in sodium chloride 0.9 % 100 mL IVPB  Status:  Discontinued        2 g 200 mL/hr over 30 Minutes Intravenous Every 8 hours 03/12/20 0937 03/18/20 1105   03/12/20 0000  vancomycin (VANCOCIN) IVPB 1000 mg/200 mL premix  Status:  Discontinued  1,000 mg 200 mL/hr over 60 Minutes Intravenous Every 8 hours 03/11/20 1816 03/19/20 1258   03/11/20 1830  cefTRIAXone (ROCEPHIN) 2 g in sodium chloride 0.9 % 100 mL IVPB        2 g 200 mL/hr over 30 Minutes Intravenous  Once 03/11/20 1822 03/11/20 2030   03/11/20 1545  vancomycin (VANCOREADY) IVPB 1500 mg/300 mL        1,500 mg 150 mL/hr over 120 Minutes Intravenous  Once 03/11/20 1532 03/11/20 1819       DVT prophylaxis: SCDs  Code Status: Full code  Family Communication: No family at bedside    Status is: Inpatient  Dispo: The patient is from: Home              Anticipated d/c is to: Home              Anticipated d/c date is: 04/20/2020              Patient currently not medically stable for discharge  Barrier to discharge-plan for angiogram procedure by CT surgery on 03/21/2020  Pressure Injury 03/19/20 Coccyx Mid Stage 2 -  Partial thickness loss of dermis presenting as a shallow open injury with a red, pink wound bed without slough. (Active)  03/19/20 2100  Location: Coccyx  Location Orientation: Mid  Staging: Stage 2 -  Partial thickness loss of dermis presenting as a shallow open injury with a red, pink wound bed without slough.  Wound Description (Comments):   Present on Admission: No         Consultants:  CT surgery  Procedures:     Objective   Vitals:   03/23/20 0327 03/23/20 0500 03/23/20 0823 03/23/20 1115  BP: 107/86  115/80 113/85  Pulse:   90 91  Resp: 14  13 15   Temp: 98.2 F (36.8 C)  97.7 F (36.5 C) 98.9 F (37.2 C)  TempSrc: Oral  Oral Oral   SpO2: 99%  94% 97%  Weight:  67.7 kg    Height:        Intake/Output Summary (Last 24 hours) at 03/23/2020 1321 Last data filed at 03/23/2020 1115 Gross per 24 hour  Intake 740 ml  Output 1680 ml  Net -940 ml    09/16 1901 - 09/18 0700 In: 2157 [P.O.:1557] Out: 1875 [Urine:1875]  Filed Weights   03/21/20 0440 03/22/20 0513 03/23/20 0500  Weight: 64.2 kg 67.9 kg 67.7 kg    Physical Examination:  General-appears in no acute distress Heart-S1-S2, regular, no murmur auscultated Lungs-clear to auscultation bilaterally, no wheezing or crackles auscultated Abdomen-soft, nontender, no organomegaly Extremities-no edema in the lower extremities Neuro-alert, oriented x3, no focal deficit noted   Data Reviewed:   Recent Results (from the past 240 hour(s))  Surgical pcr screen     Status: None   Collection Time: 03/15/20 11:56 AM   Specimen: Nasal Mucosa; Nasal Swab  Result Value Ref Range Status   MRSA, PCR NEGATIVE NEGATIVE Final   Staphylococcus aureus NEGATIVE NEGATIVE Final    Comment: (NOTE) The Xpert SA Assay (FDA approved for NASAL specimens in patients 46 years of age and older), is one component of a comprehensive surveillance program. It is not intended to diagnose infection nor to guide or monitor treatment. Performed at [facility], [address] [place], [place] [address]   Aerobic/Anaerobic Culture (surgical/deep wound)     Status: None   Collection Time: 03/15/20  2:07 PM   Specimen: Synovial, Left Shoulder;  Body Fluid  Result Value Ref Range Status   Specimen Description FLUID SYNOVIAL LEFT SHOULDER  Final   Special Requests NONE  Final   Gram Stain   Final    ABUNDANT WBC PRESENT, PREDOMINANTLY PMN RARE GRAM POSITIVE COCCI    Culture   Final    RARE METHICILLIN RESISTANT STAPHYLOCOCCUS AUREUS NO ANAEROBES ISOLATED Performed at [facility], [address] [place], [place] [address]    Report Status 03/20/2020 FINAL  Final    Organism ID, Bacteria METHICILLIN RESISTANT STAPHYLOCOCCUS AUREUS  Final      Susceptibility   Methicillin resistant staphylococcus aureus - MIC*    CIPROFLOXACIN <=0.5 SENSITIVE Sensitive     ERYTHROMYCIN >=8 RESISTANT Resistant     GENTAMICIN <=0.5 SENSITIVE Sensitive     OXACILLIN >=4 RESISTANT Resistant     TETRACYCLINE <=1 SENSITIVE Sensitive     VANCOMYCIN 1 SENSITIVE Sensitive     TRIMETH/SULFA <=10 SENSITIVE Sensitive     CLINDAMYCIN <=0.25 SENSITIVE Sensitive     RIFAMPIN <=0.5 SENSITIVE Sensitive     Inducible Clindamycin NEGATIVE Sensitive     * RARE METHICILLIN RESISTANT STAPHYLOCOCCUS AUREUS  Surgical pcr screen     Status: None   Collection Time: 03/21/20  3:37 AM   Specimen: Nasal Mucosa; Nasal Swab  Result Value Ref Range Status   MRSA, PCR NEGATIVE NEGATIVE Final   Staphylococcus aureus NEGATIVE NEGATIVE Final    Comment: (NOTE) The Xpert SA Assay (FDA approved for NASAL specimens in patients 8 years of age and older), is one component of a comprehensive surveillance program. It is not intended to diagnose infection nor to guide or monitor treatment. Performed at [facility], [address] [place], [place] [address]   Aerobic/Anaerobic Culture (surgical/deep wound)     Status: None (Preliminary result)   Collection Time: 03/21/20  9:21 AM   Specimen: PATH Other; Tissue  Result Value Ref Range Status   Specimen Description TISSUE  Final   Special Requests VEGETATION OF TRICUSPID VALVE SPEC A  Final   Gram Stain   Final    FEW WBC PRESENT, PREDOMINANTLY PMN RARE GRAM POSITIVE COCCI    Culture   Final    CULTURE REINCUBATED FOR BETTER GROWTH Performed at [facility], [address] [place], [place] [address]    Report Status PENDING  Incomplete  Acid Fast Smear (AFB)     Status: None   Collection Time: 03/21/20  9:21 AM   Specimen: PATH Other; Tissue  Result Value Ref Range Status   AFB Specimen Processing Concentration  Final   Acid  Fast Smear Negative  Final    Comment: (NOTE) Performed At: [facility] [address] [place], [place] [id number] Jolene Schimke MD IH:[id number]    Source (AFB) TISSUE  Final    Comment: VEGETATION OF TRICUSPID VALVE SPEC A Performed at [facility], [address] [place], [place] [address]      Studies:  No results found.     Meredeth Ide   [facility] If 7PM-7AM, please contact night-coverage at [email or web address], Office  [phone]   03/23/2020, 1:21 PM  LOS: 12 days

## 2020-03-23 NOTE — Progress Notes (Signed)
      [address]       [name] [address]             [phone]        2 Days Post-Op Procedure(s) (LRB): APPLICATION OF ANGIOVAC (N/A)  Subjective: Patient asking to order breakfast  Objective: Vital signs in last 24 hours: Temp:  [97.7 F (36.5 C)-98.2 F (36.8 C)] 97.7 F (36.5 C) (09/18 0823) Pulse Rate:  [90-100] 90 (09/18 0823) Cardiac Rhythm: Normal sinus rhythm (09/18 0702) Resp:  [13-18] 13 (09/18 0823) BP: (107-117)/(80-91) 115/80 (09/18 0823) SpO2:  [94 %-99 %] 94 % (09/18 0823) Weight:  [67.7 kg] 67.7 kg (09/18 0500)   Current Weight  03/23/20 67.7 kg       Intake/Output from previous day: 09/17 0701 - 09/18 0700 In: 1697 [P.O.:1197; IV Piggyback:500] Out: 1075 [Urine:1075]   Physical Exam:  Cardiovascular: RRR Pulmonary: Clear to auscultation bilaterally Wounds: Right neck and groin dressings dry and intact  Lab Results: CBC: Recent Labs    03/21/20 0420 03/22/20 0500  WBC 10.8* 11.1*  HGB 8.6* 8.4*  HCT 27.5* 27.5*  PLT 148* 136*   BMET:  Recent Labs    03/21/20 0420 03/22/20 1144  NA 131* 134*  K 4.1 4.3  CL 97* 98  CO2 30 29  GLUCOSE 112* 145*  BUN 12 21*  CREATININE 0.69 0.64  CALCIUM 8.3* 8.6*    PT/INR:  Lab Results  Component Value Date   INR 1.5 (H) 03/11/2020   ABG:  INR: Will add last result for INR, ABG once components are confirmed Will add last 4 CBG results once components are confirmed  Assessment/Plan:  1. CV - SR. S/p Angiovac for debridement of  endocarditis of the TV. Neck and right groin sutures to remain in place until Monday 09/20. 2.  ID-on Vancomycin and Cefepime for MRSA and Serratia marcescens bacteremia with TV endocarditis  3. History of IVDU-on Suboxone  Loralye Loberg M ZimmermanPA-C 03/23/2020,9:28 AM

## 2020-03-23 NOTE — Progress Notes (Signed)
     Subjective: 8 Days Post-Op s/p Procedure(s): ARTHROSCOPY SHOULDER, IRRIGATION AND DEBRIDEMENT OF SHOULDER BURSECTOMY  Patient denies any pain in his left shoulder. Good motion. No complaints   Objective:  PE: VITALS:   Vitals:   03/23/20 0327 03/23/20 0500 03/23/20 0823 03/23/20 1115  BP: 107/86  115/80 113/85  Pulse:   90 91  Resp: 14  13 15   Temp: 98.2 F (36.8 C)  97.7 F (36.5 C) 98.9 F (37.2 C)  TempSrc: Oral  Oral Oral  SpO2: 99%  94% 97%  Weight:  67.7 kg    Height:       General: laying in bed, in no acute distress MSK: Good ROM of left shoulder without pain. Bandages removed from shoulder. Steri-strips in place. Non-tender to palpation left shoulder. Full ROM through left elbow, wrist, and all fingers of left hand. Distal sensation intact.   LABS  No results found for this or any previous visit (from the past 24 hour(s)).  No results found.  Assessment/Plan:  L shoulder septic arthritis: - 8 days post-op L shoulder irrigation and debridement - removed dressing today - leave steri-strips in place - continue PT/OT - WBAT as tolerated, can perform motion as tolerated from ortho standpoint   Will continue to follow along intermittently  Contact information:   After hours and holidays please check [email or web address] for group call information for [facility]  03/23/2020, 12:38 PM

## 2020-03-24 LAB — BPAM RBC
Blood Product Expiration Date: 202109272359
Blood Product Expiration Date: 202110072359
Blood Product Expiration Date: 202110102359
Blood Product Expiration Date: 202110112359
Blood Product Expiration Date: 202110112359
ISSUE DATE / TIME: 202109152215
ISSUE DATE / TIME: 202109160737
ISSUE DATE / TIME: 202109160737
Unit Type and Rh: 5100
Unit Type and Rh: 5100
Unit Type and Rh: 5100
Unit Type and Rh: 5100
Unit Type and Rh: 5100

## 2020-03-24 LAB — TYPE AND SCREEN
ABO/RH(D): O POS
Antibody Screen: NEGATIVE
Unit division: 0
Unit division: 0
Unit division: 0
Unit division: 0
Unit division: 0

## 2020-03-24 LAB — HAPTOGLOBIN: Haptoglobin: 202 mg/dL (ref 23–355)

## 2020-03-24 NOTE — Progress Notes (Signed)
[facility]  PROGRESS NOTE  Robert Kramer IAX:[id number] DOB: 1976-06-19 DOA: 03/11/2020 PCP: Patient, No Pcp Per   Brief HPI:   44 year old male with history of polysubstance use including IV drug use, bipolar disorder came to ED for evaluation of abnormal blood cultures.  Patient was seen in this long ED on 03/06/2020 for evaluation management of heroin withdrawal.  He was treated supportively.  Blood culture was obtained and grew MRSA and Serratia marcescens.  He was called from home to come to ED for further evaluation.  CTA chest PE showed multiple cavitary and noncavitary nodules consistent with septic emboli, bilateral lower lobe pneumonia and bilateral hilar adenopathy felt to be reactive.  No pulmonary embolus was reported.  Patient was started on IV ceftriaxone and vancomycin.  TTE was performed which showed shaggy vegetations on tricuspid valve.  TEE on 03/15/2020 showed 2 x 2 cm long vegetation on tricuspid valve with resultant severe TR.  MRI left shoulder showed septic arthropathy, myositis and tenosynovitis.  He underwent washout of left shoulder by orthopedic surgery on 03/15/2020.  He was transferred to [facility] on 03/17/2020 for further evaluation of endocarditis by cardiothoracic surgery.    Subjective   Patient seen and examined, denies any complaints.   Assessment/Plan:     1. Severe sepsis secondary to MRSA and Serratia bacteremia-pulmonary septic emboli, TV endocarditis, left shoulder septic arthritis.  Patient is status post left shoulder arthroscopy with debridement irrigation for septic arthritis as per orthopedics on 03/15/2020.  Echocardiogram showed tricuspid vegetation.  TEE showed 2x2 mobile vegetation with severe TR.  MRI lumbar spine showed no evidence of epidural abscess.  Patient refused MRI right shoulder ordered by ID.  CT surgery was consulted and plan for angiovac debridement on 03/21/2020.  Infectious disease following.  Continue vancomycin, Rocephin was  changed to cefepime per ID.Marland Kitchen  Repeat blood cultures drawn on 9/8 are negative.  ID recommends 4 more weeks of antibiotic therapy starting from 03/21/2020 with vancomycin and cefepime till 04/18/2020.  ID can be called back in 2 weeks to reassess whether he can be candidate for outpatient regimen. 2. 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  CT surgery following. 3. Hyponatremia-sodium is stable at 130.  Has been low since last month.  Serum osmolality is 281. 4. Substance use disorder-patient has ongoing IV heroin use, last use was 3 days prior to admission.  Patient started on Suboxone 8/2 mg twice daily with as needed Suboxone per opiate withdrawal protocol.  HIV antibodies were nonreactive, hepatitis C antibodies were reactive.  Hepatitis C genotype 1b.  Patient can follow-up with GI as outpatient for hepatitis C. 5. Thrombocytopenia-likely due to sepsis.  Platelet count has improved to 146,000. 6. Anemia-hemoglobin is 8.4 unclear etiology, patient was transfused 1 unit PRBC.  Today hemoglobin is 8.4.  Anemia panel obtained shows iron 25, saturation 13%, TIBC 195, B12 440, reticulocyte count percent 6.1, immature fraction 27.8.  Haptoglobin has been ordered.     COVID-19 Labs  No results for input(s): DDIMER, FERRITIN, LDH, CRP in the last 72 hours.  Lab Results  Component Value Date   SARSCOV2NAA NEGATIVE 03/11/2020     Scheduled medications:   . buprenorphine-naloxone  1 tablet Sublingual BID  . Chlorhexidine Gluconate Cloth  6 each Topical Daily  . feeding supplement (ENSURE ENLIVE)  237 mL Oral TID BM  . multivitamin with minerals  1 tablet Oral Daily  . sodium chloride flush  10-40 mL Intracatheter Q12H  CBG: No results for input(s): GLUCAP in the last 168 hours.  SpO2: 97 % O2 Flow Rate (L/min): 7 L/min    CBC: Recent Labs  Lab 03/18/20 0335 03/19/20 0904 03/20/20 0500 03/21/20 0420 03/22/20 0500  WBC 11.8* 10.5 11.8* 10.8*  11.1*  NEUTROABS 9.1*  --   --   --   --   HGB 7.3* 8.0* 7.4* 8.6* 8.4*  HCT 23.5* 24.9* 23.9* 27.5* 27.5*  MCV 86.4 87.1 89.8 87.6 89.6  PLT 112* 131* 146* 148* 136*    Basic Metabolic Panel: Recent Labs  Lab 03/18/20 0335 03/19/20 0904 03/20/20 0500 03/21/20 0420 03/22/20 1144  NA 130* 131* 130* 131* 134*  K 4.1 3.6 4.1 4.1 4.3  CL 97* 97* 97* 97* 98  CO2 25 29 29 30 29   GLUCOSE 124* 92 109* 112* 145*  BUN 11 11 12 12  21*  CREATININE 0.66 0.62 0.79 0.69 0.64  CALCIUM 7.9* 8.2* 8.1* 8.3* 8.6*  MG 1.8  --   --   --   --      Liver Function Tests: Recent Labs  Lab 03/21/20 0420  AST 40  ALT 23  ALKPHOS 52  BILITOT 1.5*  PROT 6.9  ALBUMIN 1.1*     Antibiotics: Anti-infectives (From admission, onward)   Start     Dose/Rate Route Frequency Ordered Stop   03/22/20 2000  vancomycin (VANCOREADY) IVPB 1250 mg/250 mL        1,250 mg 166.7 mL/hr over 90 Minutes Intravenous Every 12 hours 03/22/20 1435 04/23/20 0759   03/20/20 1400  ceFEPIme (MAXIPIME) 2 g in sodium chloride 0.9 % 100 mL IVPB        2 g 200 mL/hr over 30 Minutes Intravenous Every 8 hours 03/20/20 0912 04/22/20 2359   03/19/20 2200  vancomycin (VANCOCIN) IVPB 1000 mg/200 mL premix  Status:  Discontinued        1,000 mg 200 mL/hr over 60 Minutes Intravenous Every 12 hours 03/19/20 1258 03/22/20 1435   03/18/20 1400  cefTRIAXone (ROCEPHIN) 2 g in sodium chloride 0.9 % 100 mL IVPB  Status:  Discontinued        2 g 200 mL/hr over 30 Minutes Intravenous Every 24 hours 03/18/20 1105 03/20/20 0909   03/12/20 2000  cefTRIAXone (ROCEPHIN) 2 g in sodium chloride 0.9 % 100 mL IVPB  Status:  Discontinued        2 g 200 mL/hr over 30 Minutes Intravenous Every 24 hours 03/11/20 2058 03/12/20 0937   03/12/20 1400  ceFEPIme (MAXIPIME) 2 g in sodium chloride 0.9 % 100 mL IVPB  Status:  Discontinued        2 g 200 mL/hr over 30 Minutes Intravenous Every 8 hours 03/12/20 0937 03/18/20 1105   03/12/20 0000  vancomycin  (VANCOCIN) IVPB 1000 mg/200 mL premix  Status:  Discontinued        1,000 mg 200 mL/hr over 60 Minutes Intravenous Every 8 hours 03/11/20 1816 03/19/20 1258   03/11/20 1830  cefTRIAXone (ROCEPHIN) 2 g in sodium chloride 0.9 % 100 mL IVPB        2 g 200 mL/hr over 30 Minutes Intravenous  Once 03/11/20 1822 03/11/20 2030   03/11/20 1545  vancomycin (VANCOREADY) IVPB 1500 mg/300 mL        1,500 mg 150 mL/hr over 120 Minutes Intravenous  Once 03/11/20 1532 03/11/20 1819       DVT prophylaxis: SCDs  Code Status: Full code  Family Communication: No family  at bedside    Status is: Inpatient  Dispo: The patient is from: Home              Anticipated d/c is to: Home              Anticipated d/c date is: 04/20/2020              Patient currently not medically stable for discharge  Barrier to discharge-plan for angiogram procedure by CT surgery on 03/21/2020  Pressure Injury 03/19/20 Coccyx Mid Stage 2 -  Partial thickness loss of dermis presenting as a shallow open injury with a red, pink wound bed without slough. (Active)  03/19/20 2100  Location: Coccyx  Location Orientation: Mid  Staging: Stage 2 -  Partial thickness loss of dermis presenting as a shallow open injury with a red, pink wound bed without slough.  Wound Description (Comments):   Present on Admission: No   Consultants:  CT surgery  Procedures:     Objective   Vitals:   03/23/20 2232 03/24/20 0045 03/24/20 0526 03/24/20 0823  BP: (!) 122/92 120/89 114/84 (P) 112/84  Pulse: 98  93 (P) 91  Resp: 17 16 12  (P) 14  Temp: 98.2 F (36.8 C) 98.7 F (37.1 C) 98.3 F (36.8 C)   TempSrc: Oral Oral Oral   SpO2:    97%  Weight:   65.1 kg   Height:        Intake/Output Summary (Last 24 hours) at 03/24/2020 1126 Last data filed at 03/24/2020 0950 Gross per 24 hour  Intake 1307 ml  Output 4650 ml  Net -3343 ml    09/17 1901 - 09/19 0700 In: 1570 [P.O.:720] Out: 5855 [Urine:5855]  Filed Weights   03/22/20  0513 03/23/20 0500 03/24/20 0526  Weight: 67.9 kg 67.7 kg 65.1 kg    Physical Examination:  General-appears in no acute distress Heart-S1-S2, regular, no murmur auscultated Lungs-clear to auscultation bilaterally, no wheezing or crackles auscultated Abdomen-soft, nontender, no organomegaly Extremities-no edema in the lower extremities Neuro-alert, oriented x3, no focal deficit noted   Data Reviewed:   Recent Results (from the past 240 hour(s))  Surgical pcr screen     Status: None   Collection Time: 03/15/20 11:56 AM   Specimen: Nasal Mucosa; Nasal Swab  Result Value Ref Range Status   MRSA, PCR NEGATIVE NEGATIVE Final   Staphylococcus aureus NEGATIVE NEGATIVE Final    Comment: (NOTE) The Xpert SA Assay (FDA approved for NASAL specimens in patients 46 years of age and older), is one component of a comprehensive surveillance program. It is not intended to diagnose infection nor to guide or monitor treatment. Performed at [facility], [address] [place], [place] [address]   Aerobic/Anaerobic Culture (surgical/deep wound)     Status: None   Collection Time: 03/15/20  2:07 PM   Specimen: Synovial, Left Shoulder; Body Fluid  Result Value Ref Range Status   Specimen Description FLUID SYNOVIAL LEFT SHOULDER  Final   Special Requests NONE  Final   Gram Stain   Final    ABUNDANT WBC PRESENT, PREDOMINANTLY PMN RARE GRAM POSITIVE COCCI    Culture   Final    RARE METHICILLIN RESISTANT STAPHYLOCOCCUS AUREUS NO ANAEROBES ISOLATED Performed at [facility], [address] [place], [place] [address]    Report Status 03/20/2020 FINAL  Final   Organism ID, Bacteria METHICILLIN RESISTANT STAPHYLOCOCCUS AUREUS  Final      Susceptibility   Methicillin resistant staphylococcus  aureus - MIC*    CIPROFLOXACIN <=0.5 SENSITIVE Sensitive     ERYTHROMYCIN >=8 RESISTANT Resistant     GENTAMICIN <=0.5 SENSITIVE Sensitive     OXACILLIN >=4 RESISTANT Resistant      TETRACYCLINE <=1 SENSITIVE Sensitive     VANCOMYCIN 1 SENSITIVE Sensitive     TRIMETH/SULFA <=10 SENSITIVE Sensitive     CLINDAMYCIN <=0.25 SENSITIVE Sensitive     RIFAMPIN <=0.5 SENSITIVE Sensitive     Inducible Clindamycin NEGATIVE Sensitive     * RARE METHICILLIN RESISTANT STAPHYLOCOCCUS AUREUS  Surgical pcr screen     Status: None   Collection Time: 03/21/20  3:37 AM   Specimen: Nasal Mucosa; Nasal Swab  Result Value Ref Range Status   MRSA, PCR NEGATIVE NEGATIVE Final   Staphylococcus aureus NEGATIVE NEGATIVE Final    Comment: (NOTE) The Xpert SA Assay (FDA approved for NASAL specimens in patients 49 years of age and older), is one component of a comprehensive surveillance program. It is not intended to diagnose infection nor to guide or monitor treatment. Performed at [facility], [address] [place], [place] [address]   Aerobic/Anaerobic Culture (surgical/deep wound)     Status: None (Preliminary result)   Collection Time: 03/21/20  9:21 AM   Specimen: PATH Other; Tissue  Result Value Ref Range Status   Specimen Description TISSUE  Final   Special Requests VEGETATION OF TRICUSPID VALVE SPEC A  Final   Gram Stain   Final    FEW WBC PRESENT, PREDOMINANTLY PMN RARE GRAM POSITIVE COCCI Performed at [facility], [address], [place], [place] [address]    Culture   Final    RARE METHICILLIN RESISTANT STAPHYLOCOCCUS AUREUS NO ANAEROBES ISOLATED; CULTURE IN PROGRESS FOR 5 DAYS    Report Status PENDING  Incomplete   Organism ID, Bacteria METHICILLIN RESISTANT STAPHYLOCOCCUS AUREUS  Final      Susceptibility   Methicillin resistant staphylococcus aureus - MIC*    CIPROFLOXACIN <=0.5 SENSITIVE Sensitive     ERYTHROMYCIN >=8 RESISTANT Resistant     GENTAMICIN <=0.5 SENSITIVE Sensitive     OXACILLIN >=4 RESISTANT Resistant     TETRACYCLINE <=1 SENSITIVE Sensitive     VANCOMYCIN 1 SENSITIVE Sensitive     TRIMETH/SULFA <=10 SENSITIVE Sensitive      CLINDAMYCIN <=0.25 SENSITIVE Sensitive     RIFAMPIN <=0.5 SENSITIVE Sensitive     Inducible Clindamycin NEGATIVE Sensitive     * RARE METHICILLIN RESISTANT STAPHYLOCOCCUS AUREUS  Acid Fast Smear (AFB)     Status: None   Collection Time: 03/21/20  9:21 AM   Specimen: PATH Other; Tissue  Result Value Ref Range Status   AFB Specimen Processing Concentration  Final   Acid Fast Smear Negative  Final    Comment: (NOTE) Performed At: [facility] [address] [place], [place] [id number] Jolene Schimke MD XQ:[id number]    Source (AFB) TISSUE  Final    Comment: VEGETATION OF TRICUSPID VALVE SPEC A Performed at [facility], [address] [place], [place] [address]      Meredeth Ide   [facility] If 7PM-7AM, please contact night-coverage at [email or web address], Office  [phone]   03/24/2020, 11:26 AM  LOS: 13 days

## 2020-03-25 LAB — GLUCOSE, CAPILLARY: Glucose-Capillary: 101 mg/dL — ABNORMAL HIGH (ref 70–99)

## 2020-03-25 NOTE — Progress Notes (Signed)
Explained to patient I needed to remove procedural sutures this am from right groin and right neck.  Pt requested I come back at a later time.  Foam dressing was applied to abrasion/scab to mid upper chest after cleansed.

## 2020-03-25 NOTE — Progress Notes (Signed)
      [address]       [place] [address]             [phone]       Neck and groin sutures removed without issue. Small amount of blood from the neck incision. Both dressed with sterile 2 x 2 dressing and tape. Patient tolerated it well.    Jari Favre, PA-C

## 2020-03-25 NOTE — Progress Notes (Signed)
[facility]  PROGRESS NOTE  Robert Kramer SNK:[id number] DOB: Jan 07, 1976 DOA: 03/11/2020 PCP: Patient, No Pcp Per   Brief HPI:   44 year old male with history of polysubstance use including IV drug use, bipolar disorder came to ED for evaluation of abnormal blood cultures.  Patient was seen in this long ED on 03/06/2020 for evaluation management of heroin withdrawal.  He was treated supportively.  Blood culture was obtained and grew MRSA and Serratia marcescens.  He was called from home to come to ED for further evaluation.  CTA chest PE showed multiple cavitary and noncavitary nodules consistent with septic emboli, bilateral lower lobe pneumonia and bilateral hilar adenopathy felt to be reactive.  No pulmonary embolus was reported.  Patient was started on IV ceftriaxone and vancomycin.  TTE was performed which showed shaggy vegetations on tricuspid valve.  TEE on 03/15/2020 showed 2 x 2 cm long vegetation on tricuspid valve with resultant severe TR.  MRI left shoulder showed septic arthropathy, myositis and tenosynovitis.  He underwent washout of left shoulder by orthopedic surgery on 03/15/2020.  He was transferred to [facility] on 03/17/2020 for further evaluation of endocarditis by cardiothoracic surgery.    Subjective   Patient seen and examined, denies any complaints.   Assessment/Plan:     1. Severe sepsis secondary to MRSA and Serratia bacteremia-pulmonary septic emboli, TV endocarditis, left shoulder septic arthritis.  Patient is status post left shoulder arthroscopy with debridement irrigation for septic arthritis as per orthopedics on 03/15/2020.  Echocardiogram showed tricuspid vegetation.  TEE showed 2x2 mobile vegetation with severe TR.  MRI lumbar spine showed no evidence of epidural abscess.  Patient refused MRI right shoulder ordered by ID.  CT surgery was consulted and plan for angiovac debridement on 03/21/2020.  Infectious disease following.  Continue vancomycin, Rocephin was  changed to cefepime per ID.Marland Kitchen  Repeat blood cultures drawn on 9/8 are negative.  ID recommends 4 more weeks of antibiotic therapy starting from 03/21/2020 with vancomycin and cefepime till 04/18/2020.  ID can be called back in 2 weeks to reassess whether he can be candidate for outpatient regimen. 2. 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  CT surgery following. 3. Hyponatremia-sodium is stable at 130.  Has been low since last month.  Serum osmolality is 281. 4. Substance use disorder-patient has ongoing IV heroin use, last use was 3 days prior to admission.  Patient started on Suboxone 8/2 mg twice daily with as needed Suboxone per opiate withdrawal protocol.  HIV antibodies were nonreactive, hepatitis C antibodies were reactive.  Hepatitis C genotype 1b.  Patient can follow-up with GI as outpatient for hepatitis C. 5. Thrombocytopenia-likely due to sepsis.  Platelet count has improved to 136,000 6. Anemia-hemoglobin is 8.4 unclear etiology, patient was transfused 1 unit PRBC.  Today hemoglobin is 8.4.  Anemia panel obtained shows iron 25, saturation 13%, TIBC 195, B12 440, reticulocyte count percent 6.1, immature fraction 27.8.  Haptoglobin is normal at 202     COVID-19 Labs  No results for input(s): DDIMER, FERRITIN, LDH, CRP in the last 72 hours.  Lab Results  Component Value Date   SARSCOV2NAA NEGATIVE 03/11/2020     Scheduled medications:   . buprenorphine-naloxone  1 tablet Sublingual BID  . Chlorhexidine Gluconate Cloth  6 each Topical Daily  . feeding supplement (ENSURE ENLIVE)  237 mL Oral TID BM  . multivitamin with minerals  1 tablet Oral Daily  . sodium chloride flush  10-40 mL Intracatheter Q12H  CBG: Recent Labs  Lab 03/25/20 0747  GLUCAP 101*    SpO2: 96 % O2 Flow Rate (L/min): 7 L/min    CBC: Recent Labs  Lab 03/19/20 0904 03/20/20 0500 03/21/20 0420 03/22/20 0500  WBC 10.5 11.8* 10.8* 11.1*  HGB 8.0* 7.4* 8.6* 8.4*   HCT 24.9* 23.9* 27.5* 27.5*  MCV 87.1 89.8 87.6 89.6  PLT 131* 146* 148* 136*    Basic Metabolic Panel: Recent Labs  Lab 03/19/20 0904 03/20/20 0500 03/21/20 0420 03/22/20 1144  NA 131* 130* 131* 134*  K 3.6 4.1 4.1 4.3  CL 97* 97* 97* 98  CO2 29 29 30 29   GLUCOSE 92 109* 112* 145*  BUN 11 12 12  21*  CREATININE 0.62 0.79 0.69 0.64  CALCIUM 8.2* 8.1* 8.3* 8.6*     Liver Function Tests: Recent Labs  Lab 03/21/20 0420  AST 40  ALT 23  ALKPHOS 52  BILITOT 1.5*  PROT 6.9  ALBUMIN 1.1*     Antibiotics: Anti-infectives (From admission, onward)   Start     Dose/Rate Route Frequency Ordered Stop   03/22/20 2000  vancomycin (VANCOREADY) IVPB 1250 mg/250 mL        1,250 mg 166.7 mL/hr over 90 Minutes Intravenous Every 12 hours 03/22/20 1435 04/23/20 0759   03/20/20 1400  ceFEPIme (MAXIPIME) 2 g in sodium chloride 0.9 % 100 mL IVPB        2 g 200 mL/hr over 30 Minutes Intravenous Every 8 hours 03/20/20 0912 04/22/20 2359   03/19/20 2200  vancomycin (VANCOCIN) IVPB 1000 mg/200 mL premix  Status:  Discontinued        1,000 mg 200 mL/hr over 60 Minutes Intravenous Every 12 hours 03/19/20 1258 03/22/20 1435   03/18/20 1400  cefTRIAXone (ROCEPHIN) 2 g in sodium chloride 0.9 % 100 mL IVPB  Status:  Discontinued        2 g 200 mL/hr over 30 Minutes Intravenous Every 24 hours 03/18/20 1105 03/20/20 0909   03/12/20 2000  cefTRIAXone (ROCEPHIN) 2 g in sodium chloride 0.9 % 100 mL IVPB  Status:  Discontinued        2 g 200 mL/hr over 30 Minutes Intravenous Every 24 hours 03/11/20 2058 03/12/20 0937   03/12/20 1400  ceFEPIme (MAXIPIME) 2 g in sodium chloride 0.9 % 100 mL IVPB  Status:  Discontinued        2 g 200 mL/hr over 30 Minutes Intravenous Every 8 hours 03/12/20 0937 03/18/20 1105   03/12/20 0000  vancomycin (VANCOCIN) IVPB 1000 mg/200 mL premix  Status:  Discontinued        1,000 mg 200 mL/hr over 60 Minutes Intravenous Every 8 hours 03/11/20 1816 03/19/20 1258    03/11/20 1830  cefTRIAXone (ROCEPHIN) 2 g in sodium chloride 0.9 % 100 mL IVPB        2 g 200 mL/hr over 30 Minutes Intravenous  Once 03/11/20 1822 03/11/20 2030   03/11/20 1545  vancomycin (VANCOREADY) IVPB 1500 mg/300 mL        1,500 mg 150 mL/hr over 120 Minutes Intravenous  Once 03/11/20 1532 03/11/20 1819       DVT prophylaxis: SCDs  Code Status: Full code  Family Communication: No family at bedside    Status is: Inpatient  Dispo: The patient is from: Home              Anticipated d/c is to: Home  Anticipated d/c date is: 04/20/2020              Patient currently not medically stable for discharge  Barrier to discharge-plan for angiogram procedure by CT surgery on 03/21/2020  Pressure Injury 03/19/20 Coccyx Mid Stage 2 -  Partial thickness loss of dermis presenting as a shallow open injury with a red, pink wound bed without slough. (Active)  03/19/20 2100  Location: Coccyx  Location Orientation: Mid  Staging: Stage 2 -  Partial thickness loss of dermis presenting as a shallow open injury with a red, pink wound bed without slough.  Wound Description (Comments):   Present on Admission: No   Consultants:  CT surgery  Procedures:     Objective   Vitals:   03/25/20 0024 03/25/20 0509 03/25/20 0845 03/25/20 1155  BP: 116/85 (!) 118/92 104/81 107/83  Pulse:   92 93  Resp: 15 16 16 14   Temp: 98.1 F (36.7 C) 99.3 F (37.4 C) 98.5 F (36.9 C) 98.3 F (36.8 C)  TempSrc: Oral Oral Oral Oral  SpO2: 98% 98% 94% 96%  Weight:  62 kg    Height:        Intake/Output Summary (Last 24 hours) at 03/25/2020 1448 Last data filed at 03/25/2020 1431 Gross per 24 hour  Intake 2818 ml  Output 5025 ml  Net -2207 ml    09/18 1901 - 09/20 0700 In: 3161 [P.O.:1911] Out: 7725 [Urine:7725]  Filed Weights   03/23/20 0500 03/24/20 0526 03/25/20 0509  Weight: 67.7 kg 65.1 kg 62 kg    Physical Examination:  General-appears in no acute distress Heart-S1-S2,  regular, no murmur auscultated Lungs-clear to auscultation bilaterally, no wheezing or crackles auscultated Abdomen-soft, nontender, no organomegaly Extremities-no edema in the lower extremities Neuro-alert, oriented x3, no focal deficit noted   Data Reviewed:   Recent Results (from the past 240 hour(s))  Surgical pcr screen     Status: None   Collection Time: 03/21/20  3:37 AM   Specimen: Nasal Mucosa; Nasal Swab  Result Value Ref Range Status   MRSA, PCR NEGATIVE NEGATIVE Final   Staphylococcus aureus NEGATIVE NEGATIVE Final    Comment: (NOTE) The Xpert SA Assay (FDA approved for NASAL specimens in patients 65 years of age and older), is one component of a comprehensive surveillance program. It is not intended to diagnose infection nor to guide or monitor treatment. Performed at [facility], [address], [place], [place]   Aerobic/Anaerobic Culture (surgical/deep wound)     Status: None (Preliminary result)   Collection Time: 03/21/20  9:21 AM   Specimen: PATH Other; Tissue  Result Value Ref Range Status   Specimen Description TISSUE  Final   Special Requests VEGETATION OF TRICUSPID VALVE SPEC A  Final   Gram Stain   Final    FEW WBC PRESENT, PREDOMINANTLY PMN RARE GRAM POSITIVE COCCI Performed at [facility], [address], [place], [place]    Culture   Final    RARE METHICILLIN RESISTANT STAPHYLOCOCCUS AUREUS NO ANAEROBES ISOLATED; CULTURE IN PROGRESS FOR 5 DAYS    Report Status PENDING  Incomplete   Organism ID, Bacteria METHICILLIN RESISTANT STAPHYLOCOCCUS AUREUS  Final      Susceptibility   Methicillin resistant staphylococcus aureus - MIC*    CIPROFLOXACIN <=0.5 SENSITIVE Sensitive     ERYTHROMYCIN >=8 RESISTANT Resistant     GENTAMICIN <=0.5 SENSITIVE Sensitive     OXACILLIN >=4 RESISTANT Resistant  TETRACYCLINE <=1 SENSITIVE Sensitive     VANCOMYCIN 1 SENSITIVE Sensitive     TRIMETH/SULFA <=10 SENSITIVE Sensitive      CLINDAMYCIN <=0.25 SENSITIVE Sensitive     RIFAMPIN <=0.5 SENSITIVE Sensitive     Inducible Clindamycin NEGATIVE Sensitive     * RARE METHICILLIN RESISTANT STAPHYLOCOCCUS AUREUS  Acid Fast Smear (AFB)     Status: None   Collection Time: 03/21/20  9:21 AM   Specimen: PATH Other; Tissue  Result Value Ref Range Status   AFB Specimen Processing Concentration  Final   Acid Fast Smear Negative  Final    Comment: (NOTE) Performed At: [facility] [address] [place], [place] [id number] Jolene SchimkeNagendra Sanjai MD [id number]    Source (AFB) TISSUE  Final    Comment: VEGETATION OF TRICUSPID VALVE SPEC A Performed at [facility], [address] [place], [place] [address]      Meredeth IdeGagan S Saveah Bahar   [facility] If 7PM-7AM, please contact night-coverage at [email or web address], Office  [phone]   03/25/2020, 2:48 PM  LOS: 14 days

## 2020-03-25 NOTE — Progress Notes (Signed)
Pharmacy Antibiotic Note  Robert Kramer is a 43 y.o. 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 Angiovac procedure is scheduled for 9/16. Additionally, patient has L shoulder septic arthritis s/p I&D 9/10 with culture growing rare MRSA. Pharmacy has been consulted for cefepime dosing for Serratia bacteremia/endocarditis.  As Serratia marcecens is an AmpC-producing organism, will switch from ceftriaxone to cefepime in order to reduce potential for AmpC beta-lactamase induction with ceftriaxone. There is limited data on clinical outcomes to support the use of third generation cephalosporins in AmpC-producers; however, in vitro and in vivo studies demonstrate that up to 30% of ceftriaxone-susceptible AmpC-producing isolates eventually are induced after third-generation cephalosporin monotherapy.  Pt afebrile, tricuspid valve tissue culture growing MRSA.  Plan: Continue cefepime 2g Q 8 hours Continue vancomycin 1250mg  IV q12h Check vancomycin trough 9/21   Height: 5\' 11"  (180.3 cm) Weight: 62 kg (136 lb 11 oz) IBW/kg (Calculated) : 75.3  Temp (24hrs), Avg:98.4 F (36.9 C), Min:98 F (36.7 C), Max:99.3 F (37.4 C)  Recent Labs  Lab 03/19/20 0904 03/20/20 0500 03/21/20 0420 03/22/20 0500 03/22/20 1030 03/22/20 1144  WBC 10.5 11.8* 10.8* 11.1*  --   --   CREATININE 0.62 0.79 0.69  --   --  0.64  VANCOTROUGH 28*  --   --   --  14*  --     Estimated Creatinine Clearance: 101.2 mL/min (by C-G formula based on SCr of 0.64 mg/dL).    Allergies  Allergen Reactions  . Tramadol Other (See Comments)    Upset stomach   Antimicrobials this admission: Vancomycin 9/6 >>  Rocephin 9/6 >>9/7, 9/13>>9/15 Cefepime 9/7 >>9/13, 9/15>>   Microbiology results: 9/1 BCx x1: MRSA 9/1 BCx x1: Serratia Marcescens (S= Bactrim, Cipro; R=Cefazolin) 9/6 UCx: mult sp F 9/6 BCx: MRSA F 9/6 COVID: neg 9/8 Hep A reactive 9/8 Hep B  NR 9/8 Hep C reactive 9/8 BCx2 ngtd 9/10 MRSA PCR neg  9/10 L shoulder synovial fluid: rare MRSA 9/16 tissue/veg: MRSA  Thank you for allowing pharmacy to be a part of this patient's care.  11/8, PharmD, BCPS Clinical Pharmacist [phone] Please check AMION for all [facility] Pharmacy numbers 03/25/2020

## 2020-03-26 LAB — BASIC METABOLIC PANEL
Anion gap: 8 (ref 5–15)
BUN: 20 mg/dL (ref 6–20)
CO2: 23 mmol/L (ref 22–32)
Calcium: 9.1 mg/dL (ref 8.9–10.3)
Chloride: 98 mmol/L (ref 98–111)
Creatinine, Ser: 0.6 mg/dL — ABNORMAL LOW (ref 0.61–1.24)
GFR calc Af Amer: >60 mL/min (ref >60)
GFR calc non Af Amer: >60 mL/min (ref >60)
Glucose, Bld: 106 mg/dL — ABNORMAL HIGH (ref 70–99)
Potassium: 4.6 mmol/L (ref 3.5–5.1)
Sodium: 129 mmol/L — ABNORMAL LOW (ref 135–145)

## 2020-03-26 LAB — CBC
HCT: 32.1 % — ABNORMAL LOW (ref 39.0–52.0)
Hemoglobin: 10.2 g/dL — ABNORMAL LOW (ref 13.0–17.0)
MCH: 28.7 pg (ref 26.0–34.0)
MCHC: 31.8 g/dL (ref 30.0–36.0)
MCV: 90.4 fL (ref 80.0–100.0)
Platelets: 165 10*3/uL (ref 150–400)
RBC: 3.55 MIL/uL — ABNORMAL LOW (ref 4.22–5.81)
RDW: 18.4 % — ABNORMAL HIGH (ref 11.5–15.5)
WBC: 12.4 10*3/uL — ABNORMAL HIGH (ref 4.0–10.5)
nRBC: 0 % (ref 0.0–0.2)

## 2020-03-26 LAB — PROTIME-INR
INR: 1.4 — ABNORMAL HIGH (ref 0.8–1.2)
Prothrombin Time: 16.5 seconds — ABNORMAL HIGH (ref 11.4–15.2)

## 2020-03-26 LAB — VANCOMYCIN, TROUGH: Vancomycin Tr: 20 ug/mL (ref 15–20)

## 2020-03-26 LAB — AEROBIC/ANAEROBIC CULTURE W GRAM STAIN (SURGICAL/DEEP WOUND)

## 2020-03-26 MED ORDER — ENOXAPARIN SODIUM 40 MG/0.4ML ~~LOC~~ SOLN
40.0000 mg | SUBCUTANEOUS | Status: DC
  Administered 2020-03-26 – 2020-04-22 (×28): 40 mg via SUBCUTANEOUS
  Filled 2020-03-26 (×28): qty 0.4

## 2020-03-26 MED ORDER — SENNA 8.6 MG PO TABS: 1.0000 | ORAL_TABLET | Freq: Every evening | ORAL | Status: DC | PRN

## 2020-03-26 MED ORDER — POLYETHYLENE GLYCOL 3350 17 G PO PACK: 17.0000 g | PACK | Freq: Every day | ORAL | Status: DC | PRN

## 2020-03-26 MED FILL — Electrolyte-R (PH 7.4) Solution: INTRAVENOUS | Qty: 1000 | Status: AC

## 2020-03-26 NOTE — Progress Notes (Signed)
[facility]  PROGRESS NOTE  Robert Kramer MGQ:[id number] DOB: February 10, 1976 DOA: 03/11/2020 PCP: Patient, No Pcp Per   Brief HPI:   44 year old male with history of polysubstance use including IV drug use, bipolar disorder came to ED for evaluation of abnormal blood cultures.  Patient was seen in this long ED on 03/06/2020 for evaluation management of heroin withdrawal.  He was treated supportively.  Blood culture was obtained and grew MRSA and Serratia marcescens.  He was called from home to come to ED for further evaluation.  CTA chest PE showed multiple cavitary and noncavitary nodules consistent with septic emboli, bilateral lower lobe pneumonia and bilateral hilar adenopathy felt to be reactive.  No pulmonary embolus was reported.  Patient was started on IV ceftriaxone and vancomycin.  TTE was performed which showed shaggy vegetations on tricuspid valve.  TEE on 03/15/2020 showed 2 x 2 cm long vegetation on tricuspid valve with resultant severe TR.  MRI left shoulder showed septic arthropathy, myositis and tenosynovitis.  He underwent washout of left shoulder by orthopedic surgery on 03/15/2020.  He was transferred to [facility] on 03/17/2020 for further evaluation of endocarditis by cardiothoracic surgery.    Subjective   Patient seen and examined, no new complaints.   Assessment/Plan:     1. Severe sepsis secondary to MRSA and Serratia bacteremia-pulmonary septic emboli, TV endocarditis, left shoulder septic arthritis.  Patient is status post left shoulder arthroscopy with debridement irrigation for septic arthritis as per orthopedics on 03/15/2020.  Echocardiogram showed tricuspid vegetation.  TEE showed 2x2 mobile vegetation with severe TR.  MRI lumbar spine showed no evidence of epidural abscess.  Patient refused MRI right shoulder ordered by ID.  CT surgery was consulted and plan for angiovac debridement on 03/21/2020.  Infectious disease following.  Continue vancomycin, Rocephin was changed  to cefepime per ID.Marland Kitchen  Repeat blood cultures drawn on 9/8 are negative.  ID recommends 4 more weeks of antibiotic therapy starting from 03/21/2020 with vancomycin and cefepime till 04/18/2020.  ID can be called back in 2 weeks to reassess whether he can be candidate for outpatient regimen. 2. 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  CT surgery following. 3. Hyponatremia-sodium is stable at 130.  Has been low since last month.  Serum osmolality is 281. 4. Substance use disorder-patient has ongoing IV heroin use, last use was 3 days prior to admission.  Patient started on Suboxone 8/2 mg twice daily with as needed Suboxone per opiate withdrawal protocol.  HIV antibodies were nonreactive, hepatitis C antibodies were reactive.  Hepatitis C genotype 1b.  Patient can follow-up with GI as outpatient for hepatitis C. 5. Thrombocytopenia-likely due to sepsis.  Platelet count has improved to 136,000 6. Anemia-hemoglobin has improved to 10.2.  Patient's hemoglobin dropped after surgery, and was transfused 1 unit PRBC.   Anemia panel obtained shows iron 25, saturation 13%, TIBC 195, B12 440, reticulocyte count percent 6.1, immature fraction 27.8.  Haptoglobin is normal at 202     COVID-19 Labs  No results for input(s): DDIMER, FERRITIN, LDH, CRP in the last 72 hours.  Lab Results  Component Value Date   SARSCOV2NAA NEGATIVE 03/11/2020     Scheduled medications:   . buprenorphine-naloxone  1 tablet Sublingual BID  . Chlorhexidine Gluconate Cloth  6 each Topical Daily  . enoxaparin (LOVENOX) injection  40 mg Subcutaneous Q24H  . feeding supplement (ENSURE ENLIVE)  237 mL Oral TID BM  . multivitamin with minerals  1 tablet Oral Daily  .  sodium chloride flush  10-40 mL Intracatheter Q12H         CBG: Recent Labs  Lab 03/25/20 0747  GLUCAP 101*    SpO2: 97 % O2 Flow Rate (L/min): 7 L/min    CBC: Recent Labs  Lab 03/20/20 0500 03/21/20 0420 03/22/20 0500  03/26/20 0542  WBC 11.8* 10.8* 11.1* 12.4*  HGB 7.4* 8.6* 8.4* 10.2*  HCT 23.9* 27.5* 27.5* 32.1*  MCV 89.8 87.6 89.6 90.4  PLT 146* 148* 136* 165    Basic Metabolic Panel: Recent Labs  Lab 03/20/20 0500 03/21/20 0420 03/22/20 1144 03/26/20 0542  NA 130* 131* 134* 129*  K 4.1 4.1 4.3 4.6  CL 97* 97* 98 98  CO2 29 30 29 23   GLUCOSE 109* 112* 145* 106*  BUN 12 12 21* 20  CREATININE 0.79 0.69 0.64 0.60*  CALCIUM 8.1* 8.3* 8.6* 9.1     Liver Function Tests: Recent Labs  Lab 03/21/20 0420  AST 40  ALT 23  ALKPHOS 52  BILITOT 1.5*  PROT 6.9  ALBUMIN 1.1*     Antibiotics: Anti-infectives (From admission, onward)   Start     Dose/Rate Route Frequency Ordered Stop   03/22/20 2000  vancomycin (VANCOREADY) IVPB 1250 mg/250 mL        1,250 mg 166.7 mL/hr over 90 Minutes Intravenous Every 12 hours 03/22/20 1435 04/23/20 0759   03/20/20 1400  ceFEPIme (MAXIPIME) 2 g in sodium chloride 0.9 % 100 mL IVPB        2 g 200 mL/hr over 30 Minutes Intravenous Every 8 hours 03/20/20 0912 04/22/20 2359   03/19/20 2200  vancomycin (VANCOCIN) IVPB 1000 mg/200 mL premix  Status:  Discontinued        1,000 mg 200 mL/hr over 60 Minutes Intravenous Every 12 hours 03/19/20 1258 03/22/20 1435   03/18/20 1400  cefTRIAXone (ROCEPHIN) 2 g in sodium chloride 0.9 % 100 mL IVPB  Status:  Discontinued        2 g 200 mL/hr over 30 Minutes Intravenous Every 24 hours 03/18/20 1105 03/20/20 0909   03/12/20 2000  cefTRIAXone (ROCEPHIN) 2 g in sodium chloride 0.9 % 100 mL IVPB  Status:  Discontinued        2 g 200 mL/hr over 30 Minutes Intravenous Every 24 hours 03/11/20 2058 03/12/20 0937   03/12/20 1400  ceFEPIme (MAXIPIME) 2 g in sodium chloride 0.9 % 100 mL IVPB  Status:  Discontinued        2 g 200 mL/hr over 30 Minutes Intravenous Every 8 hours 03/12/20 0937 03/18/20 1105   03/12/20 0000  vancomycin (VANCOCIN) IVPB 1000 mg/200 mL premix  Status:  Discontinued        1,000 mg 200 mL/hr over 60  Minutes Intravenous Every 8 hours 03/11/20 1816 03/19/20 1258   03/11/20 1830  cefTRIAXone (ROCEPHIN) 2 g in sodium chloride 0.9 % 100 mL IVPB        2 g 200 mL/hr over 30 Minutes Intravenous  Once 03/11/20 1822 03/11/20 2030   03/11/20 1545  vancomycin (VANCOREADY) IVPB 1500 mg/300 mL        1,500 mg 150 mL/hr over 120 Minutes Intravenous  Once 03/11/20 1532 03/11/20 1819       DVT prophylaxis: SCDs  Code Status: Full code  Family Communication: No family at bedside    Status is: Inpatient  Dispo: The patient is from: Home              Anticipated d/c  is to: Home              Anticipated d/c date is: 04/20/2020              Patient currently not medically stable for discharge  Barrier to discharge-plan for angiogram procedure by CT surgery on 03/21/2020  Pressure Injury 03/19/20 Coccyx Mid Stage 2 -  Partial thickness loss of dermis presenting as a shallow open injury with a red, pink wound bed without slough. (Active)  03/19/20 2100  Location: Coccyx  Location Orientation: Mid  Staging: Stage 2 -  Partial thickness loss of dermis presenting as a shallow open injury with a red, pink wound bed without slough.  Wound Description (Comments):   Present on Admission: No   Consultants:  CT surgery  Procedures:     Objective   Vitals:   03/25/20 2300 03/26/20 0402 03/26/20 0800 03/26/20 1250  BP: 106/89 115/86 114/85 113/81  Pulse: (!) 102 96 95 (!) 102  Resp: 13 14 15 19   Temp:  98.8 F (37.1 C) 98.1 F (36.7 C) 98.6 F (37 C)  TempSrc:  Oral Oral Oral  SpO2: 95% 96% 94% 97%  Weight:  60.3 kg    Height:        Intake/Output Summary (Last 24 hours) at 03/26/2020 1403 Last data filed at 03/26/2020 0850 Gross per 24 hour  Intake 1272.04 ml  Output 2575 ml  Net -1302.96 ml    09/19 1901 - 09/21 0700 In: 2796 [P.O.:1545] Out: 6275 [Urine:6275]  Filed Weights   03/24/20 0526 03/25/20 0509 03/26/20 0402  Weight: 65.1 kg 62 kg 60.3 kg    Physical  Examination:  General-appears in no acute distress Heart-S1-S2, regular, no murmur auscultated Lungs-clear to auscultation bilaterally, no wheezing or crackles auscultated Abdomen-soft, nontender, no organomegaly Extremities-no edema in the lower extremities Neuro-alert, oriented x3, no focal deficit noted   Data Reviewed:   Recent Results (from the past 240 hour(s))  Surgical pcr screen     Status: None   Collection Time: 03/21/20  3:37 AM   Specimen: Nasal Mucosa; Nasal Swab  Result Value Ref Range Status   MRSA, PCR NEGATIVE NEGATIVE Final   Staphylococcus aureus NEGATIVE NEGATIVE Final    Comment: (NOTE) The Xpert SA Assay (FDA approved for NASAL specimens in patients 46 years of age and older), is one component of a comprehensive surveillance program. It is not intended to diagnose infection nor to guide or monitor treatment. Performed at [facility], [address] [place], [place] [address]   Aerobic/Anaerobic Culture (surgical/deep wound)     Status: None   Collection Time: 03/21/20  9:21 AM   Specimen: PATH Other; Tissue  Result Value Ref Range Status   Specimen Description TISSUE  Final   Special Requests VEGETATION OF TRICUSPID VALVE SPEC A  Final   Gram Stain   Final    FEW WBC PRESENT, PREDOMINANTLY PMN RARE GRAM POSITIVE COCCI    Culture   Final    RARE METHICILLIN RESISTANT STAPHYLOCOCCUS AUREUS NO ANAEROBES ISOLATED Performed at [facility], [address] [place], [place] [address]    Report Status 03/26/2020 FINAL  Final   Organism ID, Bacteria METHICILLIN RESISTANT STAPHYLOCOCCUS AUREUS  Final      Susceptibility   Methicillin resistant staphylococcus aureus - MIC*    CIPROFLOXACIN <=0.5 SENSITIVE Sensitive     ERYTHROMYCIN >=8 RESISTANT Resistant     GENTAMICIN <=0.5 SENSITIVE Sensitive     OXACILLIN >=  4 RESISTANT Resistant     TETRACYCLINE <=1 SENSITIVE Sensitive     VANCOMYCIN 1 SENSITIVE Sensitive     TRIMETH/SULFA <=10  SENSITIVE Sensitive     CLINDAMYCIN <=0.25 SENSITIVE Sensitive     RIFAMPIN <=0.5 SENSITIVE Sensitive     Inducible Clindamycin NEGATIVE Sensitive     * RARE METHICILLIN RESISTANT STAPHYLOCOCCUS AUREUS  Acid Fast Smear (AFB)     Status: None   Collection Time: 03/21/20  9:21 AM   Specimen: PATH Other; Tissue  Result Value Ref Range Status   AFB Specimen Processing Concentration  Final   Acid Fast Smear Negative  Final    Comment: (NOTE) Performed At: [facility] [address] [place], [place] [id number] Jolene Schimke MD BD:[id number]    Source (AFB) TISSUE  Final    Comment: VEGETATION OF TRICUSPID VALVE SPEC A Performed at [facility], [address] [place], [place] [address]      Meredeth Ide   [facility] If 7PM-7AM, please contact night-coverage at [email or web address], Office  [phone]   03/26/2020, 2:03 PM  LOS: 15 days

## 2020-03-26 NOTE — Progress Notes (Signed)
Nutrition Follow-up  DOCUMENTATION CODES:   Not applicable  INTERVENTION:    Continue Ensure Enlive po TID, each supplement provides 350 kcal and 20 grams of protein.  Encourage intake of meals and supplements.  Continue MVI with minerals daily.  NUTRITION DIAGNOSIS:   Increased nutrient needs related to acute illness (sepsis) as evidenced by estimated needs.  Ongoing   GOAL:   Patient will meet greater than or equal to 90% of their needs  Progressing   MONITOR:   PO intake, Supplement acceptance, Labs, Weight trends, I & O's  REASON FOR ASSESSMENT:   Consult Assessment of nutrition requirement/status  ASSESSMENT:   44 y.o. male with medical history significant for polysubstance use disorder including injection drug use and bipolar disorder who presents to the ED for evaluation of abnormal blood cultures.  Tricuspid valve endocarditis. S/P angio vac procedure with cardiothoracic surgery on 9/16. Remains on IV antibiotics, to continue for 4 weeks.  Meal intakes have been improved, 50-100% meal completion today. Patient is also being offered Ensure Enlive TID. He likes the strawberry flavor and has been drinking 3 supplements per day.   Labs reviewed. Na 129 Medications reviewed.  Weight down to 60.3 kg today from 64 kg on 9/13. Suspect weight loss r/t negative fluid balance.  Diet Order:   Diet Order            Diet regular Room service appropriate? Yes; Fluid consistency: Thin  Diet effective now                 EDUCATION NEEDS:   No education needs have been identified at this time  Skin:  Skin Assessment: Skin Integrity Issues: Skin Integrity Issues:: Stage II Stage II: coccyx Incisions: closed incisions to L shoulder, R chest, R groin  Last BM:  9/18  Height:   Ht Readings from Last 1 Encounters:  03/11/20 5\' 11"  (1.803 m)    Weight:   Wt Readings from Last 1 Encounters:  03/26/20 60.3 kg    BMI:  Body mass index is 18.54  kg/m.  Estimated Nutritional Needs:   Kcal:  1800-2000  Protein:  85-100g  Fluid:  2L/day    03/28/20, RD, LDN, CNSC Please refer to Amion for contact information.

## 2020-03-26 NOTE — Progress Notes (Signed)
Physical Therapy Treatment Patient Details Name: Robert Kramer MRN: [id number] DOB: Apr 22, 1976 Today's Date: 03/26/2020    History of Present Illness Pt is a 44 y.o. male admitted 03/11/20 for evaluation of abnormal blood cultures. Chest CTA shows multiple cavitary and noncavitary nodules consistent with septic emboli; bilateral PNA, bilateral hilar adenopathy. 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 Pt with L shoulder septic arthritis s/p I&D 9/10. S/p catheter-based debridement of tricuspid valve 9/16. PMH includes IVDA, bipolar disorder.   PT Comments    Pt progressing with mobility. Requires up to minA with transfers and ambulation using RW. Reviewed and performed LUE therex. Patient motivated to participate; remains limited by generalized weakness, decreased activity tolerance, impaired balance and LUE pain. Will continue to follow acutely.   Follow Up Recommendations  No PT follow up;Supervision for mobility/OOB     Equipment Recommendations  Rolling walker with 5" wheels    Recommendations for Other Services       Precautions / Restrictions Precautions Precautions: Fall Restrictions Weight Bearing Restrictions: Yes LLE Weight Bearing: Weight bearing as tolerated Other Position/Activity Restrictions: AROM as tolerated    Mobility  Bed Mobility Overal bed mobility: Modified Independent Bed Mobility: Supine to Sit;Sit to Supine           General bed mobility comments: HOB slightly elevated  Transfers Overall transfer level: Needs assistance Equipment used: Rolling walker (2 wheeled) Transfers: Sit to/from Stand Sit to Stand: Min guard         General transfer comment: Cues for hand placement as pt trying to pull on RW; min guard for balance  Ambulation/Gait Ambulation/Gait assistance: Min guard;Min assist Gait Distance (Feet): 150 Feet Assistive device: Rolling walker (2 wheeled) Gait Pattern/deviations: Step-through pattern;Decreased  stride length;Staggering left Gait velocity: Decreased   General Gait Details: Slow, mildly unsteady gait with RW and min guard for balance; 1x LOB with turn requiring minA to correct, pt reports due to L shoulder "giving out." Cues to take next turn slower, no LOB. Cues to walk further distance. pt reports "I've overdone it alreadySoftware engineer Rankin (Stroke Patients Only)       Balance Overall balance assessment: Needs assistance Sitting-balance support: No upper extremity supported;Feet supported Sitting balance-Leahy Scale: Fair     Standing balance support: Single extremity supported;During functional activity;No upper extremity supported Standing balance-Leahy Scale: Fair Standing balance comment: Can static stand without UE support                            Cognition Arousal/Alertness: Awake/alert Behavior During Therapy: WFL for tasks assessed/performed Overall Cognitive Status: No family/caregiver present to determine baseline cognitive functioning Area of Impairment: Attention;Following commands;Awareness;Problem solving                   Current Attention Level: Selective   Following Commands: Follows multi-step commands inconsistently   Awareness: Emergent Problem Solving: Requires verbal cues        Exercises Shoulder Exercises Shoulder Flexion: AROM;[name];Left;Supine Shoulder ABduction: AROM;[name];Left;Supine Shoulder External Rotation: AROM;[name];Left;Supine Elbow Flexion: AROM;Left Elbow Extension: AROM;Left Wrist Flexion: AROM;Left Wrist Extension: AROM;Left    General Comments General comments (skin integrity, edema, etc.): HR up to 120s with ambulation      Pertinent Vitals/Pain Pain Assessment: Faces Faces Pain Scale: Hurts little more Pain Location: L shoulder Pain Descriptors / Indicators: Sore;Grimacing;Guarding  Pain Intervention(s): Monitored during session     Home Living                      Prior Function            PT Goals (current goals can now be found in the care plan section) Progress towards PT goals: Progressing toward goals    Frequency    Min 3X/week      PT Plan Equipment recommendations need to be updated    Co-evaluation              AM-PAC PT "6 Clicks" Mobility   Outcome Measure  Help needed turning from your back to your side while in a flat bed without using bedrails?: None Help needed moving from lying on your back to sitting on the side of a flat bed without using bedrails?: None Help needed moving to and from a bed to a chair (including a wheelchair)?: A Little Help needed standing up from a chair using your arms (e.g., wheelchair or bedside chair)?: A Little Help needed to walk in hospital room?: A Little Help needed climbing 3-5 steps with a railing? : A Little 6 Click Score: 20    End of Session   Activity Tolerance: Patient tolerated treatment well;Patient limited by fatigue Patient left: in bed;with call bell/phone within reach;with bed alarm set Nurse Communication: Mobility status PT Visit Diagnosis: Difficulty in walking, not elsewhere classified (R26.2);Other abnormalities of gait and mobility (R26.89)     Time: 0254-2706 PT Time Calculation (min) (ACUTE ONLY): 18 min  Charges:  $Therapeutic Exercise: 8-22 mins                    Ina Homes, PT, DPT Acute Rehabilitation Services  Pager [phone] Office [phone]  Malachy Chamber 03/26/2020, 12:47 PM

## 2020-03-26 NOTE — Progress Notes (Signed)
     Subjective:  Patient states left shoulder pain is still mild-moderate but much improved since admission. He is very happy with with increase in ROM. No new complaints.   Objective:  PE: VITALS:   Vitals:   03/26/20 0402 03/26/20 0800 03/26/20 1250 03/26/20 1300  BP: 115/86 114/85 113/81 113/81  Pulse: 96 95 (!) 102   Resp: 14 15 19 19   Temp: 98.8 F (37.1 C) 98.1 F (36.7 C) 98.6 F (37 C)   TempSrc: Oral Oral Oral   SpO2: 96% 94% 97%   Weight: 60.3 kg     Height:       General: laying in bed, in no acute distress MSK: 0-50 degrees AROM of left shoulder. No TTP to shoulder. Steristrips in place Full ROM through left elbow, wrist, and all fingers of left hand. Distal sensation intact.   LABS  Results for orders placed or performed during the hospital encounter of 03/11/20 (from the past 24 hour(s))  Basic metabolic panel     Status: Abnormal   Collection Time: 03/26/20  5:42 AM  Result Value Ref Range   Sodium 129 (L) 135 - 145 mmol/L   Potassium 4.6 3.5 - 5.1 mmol/L   Chloride 98 98 - 111 mmol/L   CO2 23 22 - 32 mmol/L   Glucose, Bld 106 (H) 70 - 99 mg/dL   BUN 20 6 - 20 mg/dL   Creatinine, Ser 03/28/20 (L) 0.61 - 1.24 mg/dL   Calcium 9.1 8.9 - 8.25 mg/dL   GFR calc non Af Amer >60 >60 mL/min   GFR calc Af Amer >60 >60 mL/min   Anion gap 8 5 - 15  CBC     Status: Abnormal   Collection Time: 03/26/20  5:42 AM  Result Value Ref Range   WBC 12.4 (H) 4.0 - 10.5 K/uL   RBC 3.55 (L) 4.22 - 5.81 MIL/uL   Hemoglobin 10.2 (L) 13.0 - 17.0 g/dL   HCT 03/28/20 (L) 39 - 52 %   MCV 90.4 80.0 - 100.0 fL   MCH 28.7 26.0 - 34.0 pg   MCHC 31.8 30.0 - 36.0 g/dL   RDW 70.4 (H) 88.8 - 91.6 %   Platelets 165 150 - 400 K/uL   nRBC 0.0 0.0 - 0.2 %  Vancomycin, trough     Status: None   Collection Time: 03/26/20  6:54 AM  Result Value Ref Range   Vancomycin Tr 20 15 - 20 ug/mL  Protime-INR     Status: Abnormal   Collection Time: 03/26/20  8:34 AM  Result Value Ref Range    Prothrombin Time 16.5 (H) 11.4 - 15.2 seconds   INR 1.4 (H) 0.8 - 1.2    No results found.  Assessment/Plan:  L shoulder septic arthritis: - 11 days post-op L shoulder irrigation and debridement, ROM and pain has improved - will remove steri strips at end of this week - continue PT/OT - WBAT as tolerated, can perform motion as tolerated   Will continue to follow along intermittently  Contact information:   Weekdays 8-5 03-09-2006, PA-C [phone] A fter hours and holidays please check [email or web address] for group call information for [facility]  [phone] 03/26/2020, 3:14 PM

## 2020-03-26 NOTE — Progress Notes (Signed)
Pharmacy Antibiotic Note  Robert Kramer is a 44 y.o. 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 Angiovac procedure is scheduled for 9/16. Additionally, patient has L shoulder septic arthritis s/p I&D 9/10 with culture growing rare MRSA. Pharmacy has been consulted for cefepime dosing for Serratia bacteremia/endocarditis.  As Serratia marcecens is an AmpC-producing organism, will switch from ceftriaxone to cefepime in order to reduce potential for AmpC beta-lactamase induction with ceftriaxone. There is limited data on clinical outcomes to support the use of third generation cephalosporins in AmpC-producers; however, in vitro and in vivo studies demonstrate that up to 30% of ceftriaxone-susceptible AmpC-producing isolates eventually are induced after third-generation cephalosporin monotherapy.  Pt afebrile, tricuspid valve tissue culture growing MRSA.  Vancomycin trough this morning was within goal at 20.  Plan: Continue cefepime 2g Q 8 hours Continue vancomycin 1250mg  IV q12h Check vancomycin trough 9/28 Monitor renal function and clinical signs/symptoms of infection.   Height: 5\' 11"  (180.3 cm) Weight: 60.3 kg (132 lb 15 oz) IBW/kg (Calculated) : 75.3  Temp (24hrs), Avg:98.4 F (36.9 C), Min:98.1 F (36.7 C), Max:98.8 F (37.1 C)  Recent Labs  Lab 03/20/20 0500 03/21/20 0420 03/22/20 0500 03/22/20 1030 03/22/20 1144 03/26/20 0542 03/26/20 0654  WBC 11.8* 10.8* 11.1*  --   --  12.4*  --   CREATININE 0.79 0.69  --   --  0.64 0.60*  --   VANCOTROUGH  --   --   --  14*  --   --  20    Estimated Creatinine Clearance: 98.4 mL/min (A) (by C-G formula based on SCr of 0.6 mg/dL (L)).    Allergies  Allergen Reactions  . Tramadol Other (See Comments)    Upset stomach   Antimicrobials this admission: Vancomycin 9/6 >>  Rocephin 9/6 >>9/7, 9/13>>9/15 Cefepime 9/7 >>9/13, 9/15>>   Microbiology results: 9/1 BCx  x1: MRSA 9/1 BCx x1: Serratia Marcescens (S= Bactrim, Cipro; R=Cefazolin) 9/6 UCx: mult sp F 9/6 BCx: MRSA F 9/6 COVID: neg 9/8 Hep A reactive 9/8 Hep B NR 9/8 Hep C reactive 9/8 BCx2 ngtd 9/10 MRSA PCR neg  9/10 L shoulder synovial fluid: rare MRSA 9/16 tissue/veg: MRSA  Thank you for allowing pharmacy to be a part of this patient's care.  11/8, PharmD, BCPS Clinical Pharmacist [phone] Please check AMION for all [facility] Pharmacy numbers 03/26/2020

## 2020-03-26 NOTE — Progress Notes (Signed)
OT Cancellation Note  Patient Details Name: Robert Kramer MRN: [id number] DOB: 06-22-1976   Cancelled Treatment:    Reason Eval/Treat Not Completed: Patient declined, no reason specified Pt reporting fatigue from PT's earlier session and politely declined OT session today. Pt requested re-attempt for OT session tomorrow.   Lorre Munroe 03/26/2020, 1:40 PM

## 2020-03-27 ENCOUNTER — Telehealth: Payer: Self-pay | Admitting: *Deleted

## 2020-03-27 MED ORDER — FERROUS SULFATE 325 (65 FE) MG PO TABS
325.0000 mg | ORAL_TABLET | Freq: Three times a day (TID) | ORAL | Status: DC
  Administered 2020-03-27 – 2020-04-22 (×74): 325 mg via ORAL
  Filled 2020-03-27 (×75): qty 1

## 2020-03-27 MED ORDER — DOCUSATE SODIUM 100 MG PO CAPS
100.0000 mg | ORAL_CAPSULE | Freq: Two times a day (BID) | ORAL | Status: DC
  Administered 2020-03-27 – 2020-04-22 (×36): 100 mg via ORAL
  Filled 2020-03-27 (×42): qty 1

## 2020-03-27 NOTE — Telephone Encounter (Signed)
Estimated date of discharge is 04/23/2020 which is a Tuesday. Unsure if patient would be able to make it in time for OUD Clinic. May we make an appt with a Provider on another day?

## 2020-03-27 NOTE — Telephone Encounter (Signed)
Patient requesting we let his sister, Maximillian Habibi, know if we are able to accept him in [facility]. Kinnie Feil, BSN, RN-BC

## 2020-03-27 NOTE — Telephone Encounter (Signed)
Yes, that makes sense

## 2020-03-27 NOTE — Telephone Encounter (Signed)
OUD Intake:   Patient currently inpatient at [facility]. Expected date of d/c 04/23/2020.  Drug of choice: Heroin  How long: 7 years  Other substances: Denies  Currently on suboxone: Yes, taking 1 tab BID  Interested in treatment: Yes  Around users.: No  Has support of sisters/family  Will forward to OUD Attending Pool for consideration of OUD appt. Kinnie Feil, BSN, RN-BC

## 2020-03-27 NOTE — TOC Progression Note (Signed)
Transition of Care [facility]) - Progression Note    Patient Details  Name: ROBET CRUTCHFIELD MRN: [id number] Date of Birth: May 27, 1976  Transition of [facility]) CM/SW Contact  Nance Pear, RN Phone Number: 03/27/2020, 11:52 AM  Clinical Narrative:    Case manager called patient to speak with him about Suboxone Clinic.  Information provided:  Internal Medical Clinic; Dr. Oswaldo Done; [phone]; expected discharge date:  April 22, 2020.  Patient to call today and schedule appointment for post discharge.   Expected Discharge Plan: Home/Self Care Barriers to Discharge: Continued Medical Work up  Expected Discharge Plan and Services Expected Discharge Plan: Home/Self Care   Discharge Planning Services: CM Consult   Living arrangements for the past 2 months: Single Family Home                                       Social Determinants of Health (SDOH) Interventions    Readmission Risk Interventions Readmission Risk Prevention Plan 03/27/2020  Transportation Screening Complete  PCP or Specialist Appt within 3-5 Days Complete  HRI or Home Care Consult Complete  Social Work Consult for Recovery Care Planning/Counseling Complete  Palliative Care Screening Not Applicable  Medication Review Oceanographer) Complete  Some recent data might be hidden

## 2020-03-27 NOTE — Progress Notes (Signed)
[facility] PROGRESS NOTE  Robert Kramer ZOX:[id number] DOB: Oct 17, 1975 DOA: 03/11/2020 PCP: Patient, No Pcp Per  Status: Inpatient--Remains inpatient appropriate because:Unsafe d/c plan and IV treatments appropriate due to intensity of illness or inability to take PO   Dispo: The patient is from: Home              Anticipated d/c is to: Home              Anticipated d/c date is: > 3 days              Patient currently is not medically stable to d/c.  Needs to complete a full course of IV antibiotics to treat bacteremia and endocarditis.  Patient is an IV drug abuser and it is unsafe for patient to discharge outside of hospital with PICC line in place.   Code Status: Full Family Communication: Patient only DVT prophylaxis: Lovenox Vaccination status: Has not received Covid vaccine; was last immunized against influenza in 2018 and did receive pneumococcal vaccine September 2021  HPI: 44 year old male with history of polysubstance use including IV drug use, bipolar disorder came to ED for evaluation of abnormal blood cultures. Patient was seen in [facility] on 03/06/2020 for evaluation management of heroin withdrawal.  He was treated supportively.  Blood cultures were obtained and grew MRSA and Serratia marcescens.  He was called from home to come to ED for further evaluation.  CTA chest PE showed multiple cavitary and noncavitary nodules consistent with septic emboli, bilateral lower lobe pneumonia and bilateral hilar adenopathy felt to be reactive.  No pulmonary embolus was reported.  Patient was started on IV ceftriaxone and vancomycin.  TTE was performed which showed shaggy vegetations on tricuspid valve.  TEE on 03/15/2020 showed 2 x 2 cm long vegetation on tricuspid valve with resultant severe TR.  MRI left shoulder showed septic arthropathy, myositis and tenosynovitis.  He underwent washout of left shoulder by orthopedic surgery on 03/15/2020.  He was transferred to [facility] on 03/17/2020 for  further evaluation of endocarditis by cardiothoracic surgery.  Subjective: Denies any pain.  When questioned regarding Suboxone clinic availability in the [place] area patient confirmed that he had previously been seen at [facility] but he would be unable to return because "they charged $20 per day and I cannot afford to go there".  Patient is agreeable to seeking all medical care including Suboxone clinic and PCP in the [place] area.  He adamantly states that he is done with IV drugs.  Objective: Vitals:   03/27/20 0801 03/27/20 1136  BP: 103/69 100/69  Pulse:  89  Resp: 15 18  Temp: 98.2 F (36.8 C) 98.1 F (36.7 C)  SpO2:  96%    Intake/Output Summary (Last 24 hours) at 03/27/2020 1336 Last data filed at 03/27/2020 0849 Gross per 24 hour  Intake 1169 ml  Output 2165 ml  Net -996 ml   Filed Weights   03/24/20 0526 03/25/20 0509 03/26/20 0402  Weight: 65.1 kg 62 kg 60.3 kg    Exam:  Constitutional: NAD, calm, comfortable Respiratory: clear to auscultation bilaterally, no wheezing, no crackles. Normal respiratory effort. No accessory muscle use. RA  Cardiovascular: Regular rate and rhythm, no murmurs / rubs / gallops. No extremity edema. 2+ pedal pulses. No carotid bruits.  Abdomen: no tenderness, no masses palpated. No hepatosplenomegaly. Bowel sounds positive.  Musculoskeletal: no clubbing / cyanosis. No joint deformity upper and lower extremities. Normal muscle tone.  Skin: no rashes, lesions, ulcers. No  induration Neurologic: CN 2-12 grossly intact. Sensation intact, DTR normal. Strength 5/5 x all 4 extremities.  Psychiatric: Normal judgment and insight. Alert and oriented x 3. Normal mood.    Assessment/Plan: Severe sepsis secondary to MRSA and Serratia bacteremia-pulmonary septic emboli, TV endocarditis, left shoulder septic arthritis.   Patient is status post left shoulder arthroscopy with debridement irrigation for septic arthritis as per orthopedics on  03/15/2020.  Echocardiogram showed tricuspid vegetation.  TEE showed 2x2 mobile vegetation with severe TR.   MRI lumbar spine showed no evidence of epidural abscess.  Patient refused MRI right shoulder ordered by ID.   CT surgery was consulted - s/p angiovac debridement on 03/21/2020.  Infectious disease following.  Continue IV vancomycin, Rocephin was changed to cefepime per ID. Repeat blood cultures drawn on 9/8 are negative.  ID recommendsed 4 more weeks of IV antibiotic therapy starting from 03/21/2020 with vancomycin and cefepime till 04/18/2020.  Rec call ID back in 2 weeks to reassess whether he requires outpatient regimen. Orthopedic team plans to remove Steri-Strips at the end of this week otherwise activity as tolerated  TV endocarditis-s/p Angiovac   CT surgery following.  Hyponatremia sodium is stable at 130.  Has been low since last month.   Serum osmolality is 281.  Substance use disorder Ongoing IV heroin use, last use was 3 days prior to admission.   Patient started on Suboxone 8/2 mg twice daily with as needed Suboxone per opiate withdrawal protocol.   Poke with Dr. Erlinda Honguncan Vincent with Suboxone clinic who agreed patient is an appropriate candidate.  He requested that patient be given number to contact [facility] Sub[facility]one clinic arrange for outpatient follow-up-he will be given a 5-day prescription upon discharge HIV antibodies were nonreactive Hepatitis C antibodies were reactive. Hepatitis C genotype 1b.  Patient can follow-up with GI as outpatient for hepatitis C.  Thrombocytopenia Likely due to sepsis.   Platelet count has improved to 136,000  Anemia Hemoglobin has improved to 10.2.   Patient's hemoglobin dropped after surgery, and was transfused 1 unit PRBC.    Anemia panel obtained shows iron 25, saturation 13%, TIBC 195, B12 440, reticulocyte count percent 6.1, immature fraction 27.8.   9/22 have started 3 times daily iron replacement with twice daily scheduled Colace  to prevent iron-induced constipation Haptoglobin is normal at 202    Data Reviewed: Basic Metabolic Panel: Recent Labs  Lab 03/21/20 0420 03/22/20 1144 03/26/20 0542  NA 131* 134* 129*  K 4.1 4.3 4.6  CL 97* 98 98  CO2 30 29 23   GLUCOSE 112* 145* 106*  BUN 12 21* 20  CREATININE 0.69 0.64 0.60*  CALCIUM 8.3* 8.6* 9.1   Liver Function Tests: Recent Labs  Lab 03/21/20 0420  AST 40  ALT 23  ALKPHOS 52  BILITOT 1.5*  PROT 6.9  ALBUMIN 1.1*   No results for input(s): LIPASE, AMYLASE in the last 168 hours. No results for input(s): AMMONIA in the last 168 hours. CBC: Recent Labs  Lab 03/21/20 0420 03/22/20 0500 03/26/20 0542  WBC 10.8* 11.1* 12.4*  HGB 8.6* 8.4* 10.2*  HCT 27.5* 27.5* 32.1*  MCV 87.6 89.6 90.4  PLT 148* 136* 165   Cardiac Enzymes: No results for input(s): CKTOTAL, CKMB, CKMBINDEX, TROPONINI in the last 168 hours. BNP (last 3 results) No results for input(s): BNP in the last 8760 hours.  ProBNP (last 3 results) No results for input(s): PROBNP in the last 8760 hours.  CBG: Recent Labs  Lab 03/25/20 0747  GLUCAP 101*    Recent Results (from the past 240 hour(s))  Surgical pcr screen     Status: None   Collection Time: 03/21/20  3:37 AM   Specimen: Nasal Mucosa; Nasal Swab  Result Value Ref Range Status   MRSA, PCR NEGATIVE NEGATIVE Final   Staphylococcus aureus NEGATIVE NEGATIVE Final    Comment: (NOTE) The Xpert SA Assay (FDA approved for NASAL specimens in patients 91 years of age and older), is one component of a comprehensive surveillance program. It is not intended to diagnose infection nor to guide or monitor treatment. Performed at [facility], [address], [place], [place] [address]   Fungus Culture With Stain     Status: None (Preliminary result)   Collection Time: 03/21/20  9:21 AM   Specimen: PATH Other; Tissue  Result Value Ref Range Status   Fungus Stain Final report  Final    Comment: (NOTE) Performed At:  [facility] [address] [place], [place] [id number] Jolene Schimke MD DQ:[id number]    Fungus (Mycology) Culture PENDING  Incomplete   Fungal Source TISSUE  Final    Comment: VEGETATION OF TRICUSPID VALVE SPEC A Performed at [facility], [address] [place], [place] [address]   Aerobic/Anaerobic Culture (surgical/deep wound)     Status: None   Collection Time: 03/21/20  9:21 AM   Specimen: PATH Other; Tissue  Result Value Ref Range Status   Specimen Description TISSUE  Final   Special Requests VEGETATION OF TRICUSPID VALVE SPEC A  Final   Gram Stain   Final    FEW WBC PRESENT, PREDOMINANTLY PMN RARE GRAM POSITIVE COCCI    Culture   Final    RARE METHICILLIN RESISTANT STAPHYLOCOCCUS AUREUS NO ANAEROBES ISOLATED Performed at [facility], [address], [place], [place] [address]    Report Status 03/26/2020 FINAL  Final   Organism ID, Bacteria METHICILLIN RESISTANT STAPHYLOCOCCUS AUREUS  Final      Susceptibility   Methicillin resistant staphylococcus aureus - MIC*    CIPROFLOXACIN <=0.5 SENSITIVE Sensitive     ERYTHROMYCIN >=8 RESISTANT Resistant     GENTAMICIN <=0.5 SENSITIVE Sensitive     OXACILLIN >=4 RESISTANT Resistant     TETRACYCLINE <=1 SENSITIVE Sensitive     VANCOMYCIN 1 SENSITIVE Sensitive     TRIMETH/SULFA <=10 SENSITIVE Sensitive     CLINDAMYCIN <=0.25 SENSITIVE Sensitive     RIFAMPIN <=0.5 SENSITIVE Sensitive     Inducible Clindamycin NEGATIVE Sensitive     * RARE METHICILLIN RESISTANT STAPHYLOCOCCUS AUREUS  Acid Fast Smear (AFB)     Status: None   Collection Time: 03/21/20  9:21 AM   Specimen: PATH Other; Tissue  Result Value Ref Range Status   AFB Specimen Processing Concentration  Final   Acid Fast Smear Negative  Final    Comment: (NOTE) Performed At: [facility] [address] [place], [place] [id number] Jolene Schimke MD [id number]    Source (AFB) TISSUE  Final    Comment: VEGETATION OF TRICUSPID VALVE  SPEC A Performed at [facility], [address], [place], [place] [address]   Fungus Culture Result     Status: None   Collection Time: 03/21/20  9:21 AM  Result Value Ref Range Status   Result 1 Comment  Final    Comment: (NOTE) KOH/Calcofluor preparation:  no fungus observed. Performed At: [facility] [address] [place], [place] [id number] Jolene Schimke MD [id number]  Studies: No results found.  Scheduled Meds: . buprenorphine-naloxone  1 tablet Sublingual BID  . Chlorhexidine Gluconate Cloth  6 each Topical Daily  . docusate sodium  100 mg Oral BID  . enoxaparin (LOVENOX) injection  40 mg Subcutaneous Q24H  . feeding supplement (ENSURE ENLIVE)  237 mL Oral TID BM  . ferrous sulfate  325 mg Oral TID WC  . multivitamin with minerals  1 tablet Oral Daily  . sodium chloride flush  10-40 mL Intracatheter Q12H   Continuous Infusions: . sodium chloride    . ceFEPime (MAXIPIME) IV 2 g (03/27/20 0627)  . vancomycin 1,250 mg (03/27/20 0846)    Principal Problem:   MRSA bacteremia Active Problems:   Polysubstance dependence (HCC)   Hypokalemia   Thrombocytopenia (HCC)   Serratia septicemia (HCC)   Bipolar 1 disorder (HCC)   Heroin abuse (HCC)   Arthritis, septic, shoulder (HCC)   Endocarditis of tricuspid valve   Hepatitis C virus infection   IVDU (intravenous drug user)   Normocytic anemia   Cigarette smoker   Septic pulmonary embolism (HCC)   Pressure injury of skin   Lumbar back pain   Consultants:  Orthopedics  CVTS  Infectious disease  Procedures: 9/7 2D echocardiogram: 1. Left ventricular ejection fraction, by estimation, is 60 to 65%. The left ventricle has normal function. The left ventricle has no regional wall motion abnormalities. Left ventricular diastolic function could not be evaluated. 2. Right ventricular systolic function is normal. The right ventricular size is normal. There is normal pulmonary artery systolic  pressure. The estimated right ventricular systolic pressure is 31.5 mmHg. 3. The mitral valve is normal in structure. No evidence of mitral valve regurgitation. No evidence of mitral stenosis. 4. There are several large shaggy mobile densities one of which measures 1.27 x 1.54cm on the TV worrisome for vegetations. The tricuspid valve is abnormal. 5. The aortic valve is normal in structure. Aortic valve regurgitation is not visualized. No aortic stenosis is present. 6. The inferior vena cava is normal in size with greater than 50% respiratory variability, suggesting right atrial pressure of 3 mmHg.  9/10 TEE: 1. Tricuspid valve endocarditis. 2. Left ventricular ejection fraction, by estimation, is 60 to 65%. The left ventricle has normal function. 3. Right ventricular systolic function is normal. The right ventricular size is normal. 4. No left atrial/left atrial appendage thrombus was detected. 5. Right atrial size was mildly dilated. 6. A small pericardial effusion is present. The pericardial effusion is circumferential. There is no evidence of cardiac tamponade. 7. The mitral valve is normal in structure. Trivial mitral valve regurgitation. 8. There are 2 separate elongated valvular vegetations along the medial and lateral tricuspid valve leaflets that are approximately 2 cm in length, residing mostly in the right atrial surface. There is associated severe tricuspid regurgitation.. The tricuspid valve is abnormal. Tricuspid valve regurgitation is severe. 9. The aortic valve is normal in structure. Aortic valve regurgitation is not visualized. 10. There are very small shaggy thin mobile echodensities on the pulmonic valve surface. Trivial pulmonic valve regurgitation. This may represent pulmonic  9/10 left shoulder arthroscopy with extensive debridement, I/D, excisional debridement of the labrum and bursa as well as left shoulder subacromial bursectomy  9/16 application of angio VAC with  intraoperative TEE: Right heart cannulation - Debridement of right atrial mass - Debridement of tricuspid valve vegetation  Antibiotics: Anti-infectives (From admission, onward)   Start     Dose/Rate Route Frequency Ordered Stop   03/22/20 2000  vancomycin (VANCOREADY) IVPB 1250 mg/250 mL        1,250 mg 166.7 mL/hr over 90 Minutes Intravenous Every 12 hours 03/22/20 1435 04/23/20 0759   03/20/20 1400  ceFEPIme (MAXIPIME) 2 g in sodium chloride 0.9 % 100 mL IVPB        2 g 200 mL/hr over 30 Minutes Intravenous Every 8 hours 03/20/20 0912 04/22/20 2359   03/19/20 2200  vancomycin (VANCOCIN) IVPB 1000 mg/200 mL premix  Status:  Discontinued        1,000 mg 200 mL/hr over 60 Minutes Intravenous Every 12 hours 03/19/20 1258 03/22/20 1435   03/18/20 1400  cefTRIAXone (ROCEPHIN) 2 g in sodium chloride 0.9 % 100 mL IVPB  Status:  Discontinued        2 g 200 mL/hr over 30 Minutes Intravenous Every 24 hours 03/18/20 1105 03/20/20 0909   03/12/20 2000  cefTRIAXone (ROCEPHIN) 2 g in sodium chloride 0.9 % 100 mL IVPB  Status:  Discontinued        2 g 200 mL/hr over 30 Minutes Intravenous Every 24 hours 03/11/20 2058 03/12/20 0937   03/12/20 1400  ceFEPIme (MAXIPIME) 2 g in sodium chloride 0.9 % 100 mL IVPB  Status:  Discontinued        2 g 200 mL/hr over 30 Minutes Intravenous Every 8 hours 03/12/20 0937 03/18/20 1105   03/12/20 0000  vancomycin (VANCOCIN) IVPB 1000 mg/200 mL premix  Status:  Discontinued        1,000 mg 200 mL/hr over 60 Minutes Intravenous Every 8 hours 03/11/20 1816 03/19/20 1258   03/11/20 1830  cefTRIAXone (ROCEPHIN) 2 g in sodium chloride 0.9 % 100 mL IVPB        2 g 200 mL/hr over 30 Minutes Intravenous  Once 03/11/20 1822 03/11/20 2030   03/11/20 1545  vancomycin (VANCOREADY) IVPB 1500 mg/300 mL        1,500 mg 150 mL/hr over 120 Minutes Intravenous  Once 03/11/20 1532 03/11/20 1819        Time spent: 25 minutes    Junious Silk ANP  [facility] Pager  [phone]. If 7PM-7AM, please contact night-coverage at [email or web address] 03/27/2020, 1:36 PM  LOS: 16 days

## 2020-03-27 NOTE — Telephone Encounter (Signed)
Intake appointment ok. Should be within 5 days of his discharge to get him the suboxone refill on time.

## 2020-03-28 LAB — BASIC METABOLIC PANEL
Anion gap: 6 (ref 5–15)
BUN: 23 mg/dL — ABNORMAL HIGH (ref 6–20)
CO2: 29 mmol/L (ref 22–32)
Calcium: 9.1 mg/dL (ref 8.9–10.3)
Chloride: 98 mmol/L (ref 98–111)
Creatinine, Ser: 0.54 mg/dL — ABNORMAL LOW (ref 0.61–1.24)
GFR calc Af Amer: >60 mL/min (ref >60)
GFR calc non Af Amer: >60 mL/min (ref >60)
Glucose, Bld: 99 mg/dL (ref 70–99)
Potassium: 4 mmol/L (ref 3.5–5.1)
Sodium: 133 mmol/L — ABNORMAL LOW (ref 135–145)

## 2020-03-28 NOTE — Progress Notes (Signed)
PT Cancellation Note  Patient Details Name: SYLVIO WEATHERALL MRN: [id number] DOB: 21-Feb-1976   Cancelled Treatment:     pt asleep with multiple attempts to arouse, remained asleep; will attempt in PM time permitting  Ginette Otto, DPT Acute Rehabilitation Services [phone]  Lucretia Field 03/28/2020, 12:09 PM

## 2020-03-28 NOTE — Telephone Encounter (Signed)
Patient has been scheduled for 04/24/2020 at 9:45 with Dr. Nedra Hai. [facility] CM, Melody, has been made aware and she will relay this to patient. Kinnie Feil, BSN, RN-BC

## 2020-03-28 NOTE — Progress Notes (Signed)
Occupational Therapy Treatment Patient Details Name: Robert Kramer MRN: [id number] DOB: 03-07-1976 Today's Date: 03/28/2020    History of present illness Pt is a 44 y.o. male admitted 03/11/20 for evaluation of abnormal blood cultures. Chest CTA shows multiple cavitary and noncavitary nodules consistent with septic emboli; bilateral PNA, bilateral hilar adenopathy. 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 Pt with L shoulder septic arthritis s/p I&D 9/10. S/p catheter-based debridement of tricuspid valve 9/16. PMH includes IVDA, bipolar disorder.  Underwent AngioVac procedure 9/16.   OT comments  Progressed patient from supine HEP to seated edge of bed HEP.  Started in supine: [name] to L shoulder in flexion and then Abduction, and internal and external rotation. Able to achieve 120 degrees shoulder flex with facial grimaces AA.  Then allowed patient to perform the same Shoulder flexion AROM.  He was able to achieve 100 degrees shoulder flexion in supine with expressed discomfort to shoulder.  OT then performed supine isometric for shoulder flexion, extension, Abduction and Adduction.  [name] in supine, 5 reps and two sets.  Most pain expressed was during Abduction.  Progressed to seated [name] for shoulder flexion and Abduction.  Patient able to achieve 80 degrees active assist for flexion and abduction to L shoulder.  5 reps and 2 sets.  See progress note for greater detail and pain ratings.  Ice applied at the conclusion.    Follow Up Recommendations    n/a   Equipment Recommendations    Depends on progress   Recommendations for Other Services      Precautions / Restrictions Precautions Precautions: Fall Restrictions Weight Bearing Restrictions: Yes LUE Weight Bearing: Weight bearing as tolerated LLE Weight Bearing: Weight bearing as tolerated Other Position/Activity Restrictions: AROM as tolerated       Mobility Bed  Mobility Overal bed mobility: Modified Independent Bed Mobility: Supine to Sit     Supine to sit: Independent Sit to supine: Independent   General bed mobility comments: cues to watch IV line length.  Transfers Overall transfer level: Needs assistance Equipment used: Rolling walker (2 wheeled) Transfers: Sit to/from Stand Sit to Stand: Min guard         General transfer comment: Cues for hand placement as pt trying to pull on RW; min guard for balance    Balance   Sitting-balance support: No upper extremity supported;Feet supported Sitting balance-Leahy Scale: Good                                                     Cognition Arousal/Alertness: Awake/alert Behavior During Therapy: WFL for tasks assessed/performed Overall Cognitive Status: Within Functional Limits for tasks assessed Area of Impairment: Attention;Following commands;Awareness;Problem solving                   Current Attention Level: Selective   Following Commands: Follows multi-step commands inconsistently   Awareness: Emergent Problem Solving: Requires verbal cues General Comments: pt requiring increased time to process cueing        Exercises General Exercises - Upper Extremity Shoulder Flexion: [name];Left;20 reps;Supine Shoulder ABduction: [name];Left;20 reps;Supine Shoulder Exercises Shoulder Flexion: AROM;[name];Seated;10 reps Shoulder ABduction: AROM;[name];Left;Seated Shoulder External Rotation: AROM;[name];Left;Seated   Shoulder Instructions  Continue to progress from supine to seated HEP, and add theraband.  Pertinent Vitals/ Pain       Pain Assessment: 0-10 Pain Score: 8  Faces Pain Scale: Hurts little more Pain Location: L shoulder Pain Descriptors / Indicators: Sore;Grimacing;Guarding Pain Intervention(s): Monitored during session;Ice applied                                                          Frequency   Min 2X/week        Progress Toward Goals  OT Goals(current goals can now be found in the care plan section)  Progress towards OT goals: Progressing toward goals  Acute Rehab OT Goals Patient Stated Goal: Continue to get my arm better. OT Goal Formulation: With patient Time For Goal Achievement: 04/01/20 Potential to Achieve Goals: Good  Plan Frequency remains appropriate    Co-evaluation                 AM-PAC OT "6 Clicks" Daily Activity     Outcome Measure   Help from another person eating meals?: None Help from another person taking care of personal grooming?: None Help from another person toileting, which includes using toliet, bedpan, or urinal?: A Little Help from another person bathing (including washing, rinsing, drying)?: A Little Help from another person to put on and taking off regular upper body clothing?: None Help from another person to put on and taking off regular lower body clothing?: None 6 Click Score: 22    End of Session    OT Visit Diagnosis: Pain Pain - Right/Left: Left Pain - part of body: Shoulder   Activity Tolerance Patient tolerated treatment well   Patient Left in bed;with call bell/phone within reach   Nurse Communication          Time: 9381-8299 OT Time Calculation (min): 21 min  Charges: OT General Charges $OT Visit: 1 Visit OT Treatments $Therapeutic Exercise: 8-22 mins  03/28/2020  Rich, OTR/L  Acute Rehabilitation Services  Office:  [phone]    Suzanna Obey 03/28/2020, 3:48 PM

## 2020-03-28 NOTE — Telephone Encounter (Signed)
Thank you for setting his f/u appointment. I actually took care of this patient last week.

## 2020-03-28 NOTE — Progress Notes (Signed)
Physical Therapy Treatment Patient Details Name: RAYNALD ROUILLARD MRN: [id number] DOB: 1976/07/02 Today's Date: 03/28/2020    History of Present Illness Pt is a 44 y.o. male admitted 03/11/20 for evaluation of abnormal blood cultures. Chest CTA shows multiple cavitary and noncavitary nodules consistent with septic emboli; bilateral PNA, bilateral hilar adenopathy. 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 Pt with L shoulder septic arthritis s/p I&D 9/10. S/p catheter-based debridement of tricuspid valve 9/16. PMH includes IVDA, bipolar disorder.    PT Comments    Pt with limited participation in sess; pt perseverating on ordering food and getting his sheets changed; NT changed sheets during standing activity; pt continues to be limited with AROM with LUE due to pain; pt demonstrating poor endurance and safety awareness with use of AD; pt will benefit from skilled PT to address deficits in ROM, strength, endurance, gait, balance and overall safety awareness to maximize independence with functional mobility prior to discharge.      Follow Up Recommendations  Home health PT;Supervision/Assistance - 24 hour     Equipment Recommendations  Rolling walker with 5" wheels    Recommendations for Other Services       Precautions / Restrictions Precautions Precautions: Fall Restrictions Weight Bearing Restrictions: Yes LUE Weight Bearing: Weight bearing as tolerated LLE Weight Bearing: Weight bearing as tolerated Other Position/Activity Restrictions: AROM as tolerated    Mobility  Bed Mobility Overal bed mobility: Modified Independent Bed Mobility: Supine to Sit     Supine to sit: Modified independent (Device/Increase time)        Transfers Overall transfer level: Needs assistance Equipment used: Rolling walker (2 wheeled) Transfers: Sit to/from Stand Sit to Stand: Min guard         General transfer comment: Cues for hand placement as pt trying to pull on RW;  min guard for balance  Ambulation/Gait                 Stairs             Wheelchair Mobility    Modified Rankin (Stroke Patients Only)       Balance                                            Cognition Arousal/Alertness: Awake/alert   Overall Cognitive Status: No family/caregiver present to determine baseline cognitive functioning Area of Impairment: Attention;Following commands;Awareness;Problem solving                   Current Attention Level: Selective   Following Commands: Follows multi-step commands inconsistently   Awareness: Emergent Problem Solving: Requires verbal cues General Comments: pt requiring increased time to process cueing      Exercises General Exercises - Upper Extremity Shoulder Flexion: [name];Supine;Left;20 reps Shoulder ABduction: [name];Left;20 reps;Supine General Exercises - Lower Extremity Hip Flexion/Marching: AROM;20 reps;Standing;Other (comment) (with use of RW)    General Comments        Pertinent Vitals/Pain Pain Assessment: 0-10 Pain Score: 8  Pain Location: L shoulder Pain Descriptors / Indicators: Sore;Grimacing;Guarding    Home Living                      Prior Function            PT Goals (current goals can now be found in the care plan section) Acute Rehab PT Goals Patient  Stated Goal: To be able to use my left arm again. PT Goal Formulation: With patient Time For Goal Achievement: 03/30/20 Potential to Achieve Goals: Good Progress towards PT goals: Progressing toward goals    Frequency    Min 3X/week      PT Plan Current plan remains appropriate    Co-evaluation              AM-PAC PT "6 Clicks" Mobility   Outcome Measure  Help needed turning from your back to your side while in a flat bed without using bedrails?: None Help needed moving from lying on your back to sitting on the side of a flat bed without using bedrails?: None Help needed moving  to and from a bed to a chair (including a wheelchair)?: A Little Help needed standing up from a chair using your arms (e.g., wheelchair or bedside chair)?: A Little Help needed to walk in hospital room?: A Little Help needed climbing 3-5 steps with a railing? : A Little 6 Click Score: 20    End of Session Equipment Utilized During Treatment: Gait belt Activity Tolerance: Patient tolerated treatment well;Patient limited by fatigue Patient left: in bed;with nursing/sitter in room;with call bell/phone within reach Nurse Communication: Mobility status PT Visit Diagnosis: Difficulty in walking, not elsewhere classified (R26.2);Other abnormalities of gait and mobility (R26.89)     Time: 2130-8657 PT Time Calculation (min) (ACUTE ONLY): 11 min  Charges:  $Therapeutic Exercise: 8-22 mins                     Ginette Otto, DPT Acute Rehabilitation Services [phone]   Lucretia Field 03/28/2020, 2:06 PM

## 2020-03-28 NOTE — Care Management (Signed)
1051 03-28-20 Appointment has been scheduled at the [facility] 04-24-20 @ 9:45 am with Dr. Nedra Hai. Appointment placed on the AVS and patient is aware of date and time. Gala Lewandowsky, RN,BSN Case Manager

## 2020-03-28 NOTE — Progress Notes (Addendum)
[facility] PROGRESS NOTE  Robert Kramer DTO:[id number] DOB: Jul 19, 1975 DOA: 03/11/2020 PCP: Patient, No Pcp Per  Status: Inpatient--Remains inpatient appropriate because:Unsafe d/c plan and IV treatments appropriate due to intensity of illness or inability to take PO   Dispo: The patient is from: Home              Anticipated d/c is to: Home              Anticipated d/c date is: > 3 days              Patient currently is not medically stable to d/c.  Needs to complete a full course of IV antibiotics to treat bacteremia and endocarditis.  Patient is an IV drug abuser and it is unsafe for patient to discharge outside of hospital with PICC line in place.   Code Status: Full Family Communication: Patient only DVT prophylaxis: Lovenox Vaccination status: Has not received Covid vaccine; was last immunized against influenza in 2018 and did receive pneumococcal vaccine September 2021  HPI: 44 year old male with history of polysubstance use including IV drug use, bipolar disorder came to ED for evaluation of abnormal blood cultures. Patient was seen in [facility] on 03/06/2020 for evaluation management of heroin withdrawal.  He was treated supportively.  Blood cultures were obtained and grew MRSA and Serratia marcescens.  He was called from home to come to ED for further evaluation.  CTA chest PE showed multiple cavitary and noncavitary nodules consistent with septic emboli, bilateral lower lobe pneumonia and bilateral hilar adenopathy felt to be reactive.  No pulmonary embolus was reported.  Patient was started on IV ceftriaxone and vancomycin.  TTE was performed which showed shaggy vegetations on tricuspid valve.  TEE on 03/15/2020 showed 2 x 2 cm long vegetation on tricuspid valve with resultant severe TR.  MRI left shoulder showed septic arthropathy, myositis and tenosynovitis.  He underwent washout of left shoulder by orthopedic surgery on 03/15/2020.  He was transferred to [facility] on 03/17/2020 for  further evaluation of endocarditis by cardiothoracic surgery.  Subjective: Awake.  No complaints verbalized.  States has asked his sister to assist with his discharge planning including communications with Suboxone clinic.  Of note has also completed OUD telephone intake query prior to ending first appointment at the Suboxone clinic in [facility]  Objective: Vitals:   03/28/20 0043 03/28/20 0500  BP: 109/78   Pulse: 91   Resp: 14 10  Temp: 98.4 F (36.9 C)   SpO2: 94%     Intake/Output Summary (Last 24 hours) at 03/28/2020 0953 Last data filed at 03/28/2020 0814 Gross per 24 hour  Intake 180 ml  Output 975 ml  Net -795 ml   Filed Weights   03/25/20 0509 03/26/20 0402 03/28/20 0500  Weight: 62 kg 60.3 kg 61 kg    Exam:  Constitutional: NAD, calm, comfortable Respiratory: clear to auscultation bilaterally, no wheezing, no crackles. Normal respiratory effort. No accessory muscle use. RA  Cardiovascular: Regular rate and rhythm, no murmurs / rubs / gallops. No extremity edema. 2+ pedal pulses. No carotid bruits.  Abdomen: no tenderness, no masses palpated. No hepatosplenomegaly. Bowel sounds positive.  Musculoskeletal: no clubbing / cyanosis. No joint deformity upper and lower extremities. Normal muscle tone.  Skin: no rashes, lesions, ulcers. No induration Neurologic: CN 2-12 grossly intact. Sensation intact, DTR normal. Strength 5/5 x all 4 extremities.  Psychiatric: Normal judgment and insight. Alert and oriented x 3. Normal mood.    Assessment/Plan: Severe  sepsis secondary to MRSA and Serratia bacteremia-pulmonary septic emboli, TV endocarditis, left shoulder septic arthritis.   Patient is status post left shoulder arthroscopy with debridement irrigation for septic arthritis as per orthopedics on 03/15/2020.  Echocardiogram showed tricuspid vegetation.  TEE showed 2x2 mobile vegetation with severe TR.   MRI lumbar spine showed no evidence of epidural abscess.  Patient refused  MRI right shoulder ordered by ID.   CT surgery was consulted - s/p angiovac debridement on 03/21/2020.  Infectious disease following.  Continue IV vancomycin, Rocephin was changed to cefepime per ID. Repeat blood cultures drawn on 9/8 are negative.  ID recommendsed 4 more weeks of IV antibiotic therapy starting from 03/21/2020 with vancomycin and cefepime till 04/18/2020.  Rec call ID back in 2 weeks to reassess whether he requires outpatient regimen. Orthopedic team plans to remove Steri-Strips at the end of this week otherwise activity as tolerated  TV endocarditis-s/p Angiovac   CT surgery following.  Hyponatremia sodium is stable at 133 as of 9/23 Has been low since last month.   Serum osmolality is 281.  Substance use disorder Ongoing IV heroin use, last use was 3 days prior to admission.   Patient started on Suboxone 8/2 mg twice daily with as needed Suboxone per opiate withdrawal protocol.   Poke with Dr. Erlinda Hong with Suboxone clinic who agreed patient is an appropriate candidate.  He requested that patient be given number to contact [facility] arrange for outpatient follow-up; patient has completed this request. OUD intake assessment completed by telephone. Patient has been scheduled for 04/24/2020 at 9:45 with Dr. Nedra Hai.  HIV antibodies were nonreactive Hepatitis C antibodies were reactive. Hepatitis C genotype 1b.  Patient can follow-up with GI as outpatient for hepatitis C.  Thrombocytopenia Likely due to sepsis.   Platelet count has improved to 136,000  Anemia Hemoglobin has improved to 10.2.   Patient's hemoglobin dropped after surgery, and was transfused 1 unit PRBC.    Anemia panel obtained shows iron 25, saturation 13%, TIBC 195, B12 440, reticulocyte count percent 6.1, immature fraction 27.8.   9/22 have started 3 times daily iron replacement with twice daily scheduled Colace to prevent iron-induced constipation Haptoglobin is normal at 202    Data  Reviewed: Basic Metabolic Panel: Recent Labs  Lab 03/22/20 1144 03/26/20 0542 03/28/20 0500  NA 134* 129* 133*  K 4.3 4.6 4.0  CL 98 98 98  CO2 29 23 29   GLUCOSE 145* 106* 99  BUN 21* 20 23*  CREATININE 0.64 0.60* 0.54*  CALCIUM 8.6* 9.1 9.1   Liver Function Tests: No results for input(s): AST, ALT, ALKPHOS, BILITOT, PROT, ALBUMIN in the last 168 hours. No results for input(s): LIPASE, AMYLASE in the last 168 hours. No results for input(s): AMMONIA in the last 168 hours. CBC: Recent Labs  Lab 03/22/20 0500 03/26/20 0542  WBC 11.1* 12.4*  HGB 8.4* 10.2*  HCT 27.5* 32.1*  MCV 89.6 90.4  PLT 136* 165   Cardiac Enzymes: No results for input(s): CKTOTAL, CKMB, CKMBINDEX, TROPONINI in the last 168 hours. BNP (last 3 results) No results for input(s): BNP in the last 8760 hours.  ProBNP (last 3 results) No results for input(s): PROBNP in the last 8760 hours.  CBG: Recent Labs  Lab 03/25/20 0747  GLUCAP 101*    Recent Results (from the past 240 hour(s))  Surgical pcr screen     Status: None   Collection Time: 03/21/20  3:37 AM   Specimen: Nasal  Mucosa; Nasal Swab  Result Value Ref Range Status   MRSA, PCR NEGATIVE NEGATIVE Final   Staphylococcus aureus NEGATIVE NEGATIVE Final    Comment: (NOTE) The Xpert SA Assay (FDA approved for NASAL specimens in patients 46 years of age and older), is one component of a comprehensive surveillance program. It is not intended to diagnose infection nor to guide or monitor treatment. Performed at [facility], [address] [place], [place] [address]   Fungus Culture With Stain     Status: None (Preliminary result)   Collection Time: 03/21/20  9:21 AM   Specimen: PATH Other; Tissue  Result Value Ref Range Status   Fungus Stain Final report  Final    Comment: (NOTE) Performed At: [facility] [address] [place], [place] [id number] Jolene SchimkeNagendra Sanjai MD XB:[id number]:[phone]    Fungus (Mycology) Culture PENDING   Incomplete   Fungal Source TISSUE  Final    Comment: VEGETATION OF TRICUSPID VALVE SPEC A Performed at [facility], [address] [place], [place] [address]   Aerobic/Anaerobic Culture (surgical/deep wound)     Status: None   Collection Time: 03/21/20  9:21 AM   Specimen: PATH Other; Tissue  Result Value Ref Range Status   Specimen Description TISSUE  Final   Special Requests VEGETATION OF TRICUSPID VALVE SPEC A  Final   Gram Stain   Final    FEW WBC PRESENT, PREDOMINANTLY PMN RARE GRAM POSITIVE COCCI    Culture   Final    RARE METHICILLIN RESISTANT STAPHYLOCOCCUS AUREUS NO ANAEROBES ISOLATED Performed at [facility], [address] [place], [place] [address]    Report Status 03/26/2020 FINAL  Final   Organism ID, Bacteria METHICILLIN RESISTANT STAPHYLOCOCCUS AUREUS  Final      Susceptibility   Methicillin resistant staphylococcus aureus - MIC*    CIPROFLOXACIN <=0.5 SENSITIVE Sensitive     ERYTHROMYCIN >=8 RESISTANT Resistant     GENTAMICIN <=0.5 SENSITIVE Sensitive     OXACILLIN >=4 RESISTANT Resistant     TETRACYCLINE <=1 SENSITIVE Sensitive     VANCOMYCIN 1 SENSITIVE Sensitive     TRIMETH/SULFA <=10 SENSITIVE Sensitive     CLINDAMYCIN <=0.25 SENSITIVE Sensitive     RIFAMPIN <=0.5 SENSITIVE Sensitive     Inducible Clindamycin NEGATIVE Sensitive     * RARE METHICILLIN RESISTANT STAPHYLOCOCCUS AUREUS  Acid Fast Smear (AFB)     Status: None   Collection Time: 03/21/20  9:21 AM   Specimen: PATH Other; Tissue  Result Value Ref Range Status   AFB Specimen Processing Concentration  Final   Acid Fast Smear Negative  Final    Comment: (NOTE) Performed At: [facility] [address] [place], [place] [id number] Jolene SchimkeNagendra Sanjai MD GL:[id number]:[phone]    Source (AFB) TISSUE  Final    Comment: VEGETATION OF TRICUSPID VALVE SPEC A Performed at [facility], [address] [place], [place] [address]   Fungus Culture Result     Status: None   Collection  Time: 03/21/20  9:21 AM  Result Value Ref Range Status   Result 1 Comment  Final    Comment: (NOTE) KOH/Calcofluor preparation:  no fungus observed. Performed At: [facility] [address] [place], [place] [id number] Jolene SchimkeNagendra Sanjai MD FU:[id number]:[phone]      Studies: No results found.  Scheduled Meds:  buprenorphine-naloxone  1 tablet Sublingual BID   Chlorhexidine Gluconate Cloth  6 each Topical Daily   docusate sodium  100 mg Oral BID  enoxaparin (LOVENOX) injection  40 mg Subcutaneous Q24H   feeding supplement (ENSURE ENLIVE)  237 mL Oral TID BM   ferrous sulfate  325 mg Oral TID WC   multivitamin with minerals  1 tablet Oral Daily   sodium chloride flush  10-40 mL Intracatheter Q12H   Continuous Infusions:  ceFEPime (MAXIPIME) IV 2 g (03/28/20 0545)   vancomycin 1,250 mg (03/28/20 0951)    Principal Problem:   MRSA bacteremia Active Problems:   Polysubstance dependence (HCC)   Hypokalemia   Thrombocytopenia (HCC)   Serratia septicemia (HCC)   Bipolar 1 disorder (HCC)   Heroin abuse (HCC)   Arthritis, septic, shoulder (HCC)   Endocarditis of tricuspid valve   Hepatitis C virus infection   IVDU (intravenous drug user)   Normocytic anemia   Cigarette smoker   Septic pulmonary embolism (HCC)   Pressure injury of skin   Lumbar back pain   Consultants:  Orthopedics  CVTS  Infectious disease  Procedures: 9/7 2D echocardiogram: 1. Left ventricular ejection fraction, by estimation, is 60 to 65%. The left ventricle has normal function. The left ventricle has no regional wall motion abnormalities. Left ventricular diastolic function could not be evaluated. 2. Right ventricular systolic function is normal. The right ventricular size is normal. There is normal pulmonary artery systolic pressure. The estimated right ventricular systolic pressure is 31.5 mmHg. 3. The mitral valve is normal in structure. No evidence of mitral valve regurgitation. No  evidence of mitral stenosis. 4. There are several large shaggy mobile densities one of which measures 1.27 x 1.54cm on the TV worrisome for vegetations. The tricuspid valve is abnormal. 5. The aortic valve is normal in structure. Aortic valve regurgitation is not visualized. No aortic stenosis is present. 6. The inferior vena cava is normal in size with greater than 50% respiratory variability, suggesting right atrial pressure of 3 mmHg.  9/10 TEE: 1. Tricuspid valve endocarditis. 2. Left ventricular ejection fraction, by estimation, is 60 to 65%. The left ventricle has normal function. 3. Right ventricular systolic function is normal. The right ventricular size is normal. 4. No left atrial/left atrial appendage thrombus was detected. 5. Right atrial size was mildly dilated. 6. A small pericardial effusion is present. The pericardial effusion is circumferential. There is no evidence of cardiac tamponade. 7. The mitral valve is normal in structure. Trivial mitral valve regurgitation. 8. There are 2 separate elongated valvular vegetations along the medial and lateral tricuspid valve leaflets that are approximately 2 cm in length, residing mostly in the right atrial surface. There is associated severe tricuspid regurgitation.. The tricuspid valve is abnormal. Tricuspid valve regurgitation is severe. 9. The aortic valve is normal in structure. Aortic valve regurgitation is not visualized. 10. There are very small shaggy thin mobile echodensities on the pulmonic valve surface. Trivial pulmonic valve regurgitation. This may represent pulmonic  9/10 left shoulder arthroscopy with extensive debridement, I/D, excisional debridement of the labrum and bursa as well as left shoulder subacromial bursectomy  9/16 application of angio VAC with intraoperative TEE: Right heart cannulation - Debridement of right atrial mass - Debridement of tricuspid valve vegetation  Antibiotics: Anti-infectives (From  admission, onward)   Start     Dose/Rate Route Frequency Ordered Stop   03/22/20 2000  vancomycin (VANCOREADY) IVPB 1250 mg/250 mL        1,250 mg 166.7 mL/hr over 90 Minutes Intravenous Every 12 hours 03/22/20 1435 04/23/20 0759   03/20/20 1400  ceFEPIme (MAXIPIME) 2 g in sodium chloride 0.9 %  100 mL IVPB        2 g 200 mL/hr over 30 Minutes Intravenous Every 8 hours 03/20/20 0912 04/22/20 2359   03/19/20 2200  vancomycin (VANCOCIN) IVPB 1000 mg/200 mL premix  Status:  Discontinued        1,000 mg 200 mL/hr over 60 Minutes Intravenous Every 12 hours 03/19/20 1258 03/22/20 1435   03/18/20 1400  cefTRIAXone (ROCEPHIN) 2 g in sodium chloride 0.9 % 100 mL IVPB  Status:  Discontinued        2 g 200 mL/hr over 30 Minutes Intravenous Every 24 hours 03/18/20 1105 03/20/20 0909   03/12/20 2000  cefTRIAXone (ROCEPHIN) 2 g in sodium chloride 0.9 % 100 mL IVPB  Status:  Discontinued        2 g 200 mL/hr over 30 Minutes Intravenous Every 24 hours 03/11/20 2058 03/12/20 0937   03/12/20 1400  ceFEPIme (MAXIPIME) 2 g in sodium chloride 0.9 % 100 mL IVPB  Status:  Discontinued        2 g 200 mL/hr over 30 Minutes Intravenous Every 8 hours 03/12/20 0937 03/18/20 1105   03/12/20 0000  vancomycin (VANCOCIN) IVPB 1000 mg/200 mL premix  Status:  Discontinued        1,000 mg 200 mL/hr over 60 Minutes Intravenous Every 8 hours 03/11/20 1816 03/19/20 1258   03/11/20 1830  cefTRIAXone (ROCEPHIN) 2 g in sodium chloride 0.9 % 100 mL IVPB        2 g 200 mL/hr over 30 Minutes Intravenous  Once 03/11/20 1822 03/11/20 2030   03/11/20 1545  vancomycin (VANCOREADY) IVPB 1500 mg/300 mL        1,500 mg 150 mL/hr over 120 Minutes Intravenous  Once 03/11/20 1532 03/11/20 1819       Time spent: 20 minutes    Junious Silk ANP  [facility] Pager [phone]. If 7PM-7AM, please contact night-coverage at [email or web address] 03/28/2020, 9:53 AM  LOS: 17 days

## 2020-03-29 NOTE — Progress Notes (Signed)
[facility] PROGRESS NOTE  Robert Kramer JME:[id number] DOB: 02/25/1976 DOA: 03/11/2020 PCP: Patient, No Pcp Per  Status: Inpatient--Remains inpatient appropriate because:Unsafe d/c plan and IV treatments appropriate due to intensity of illness or inability to take PO   Dispo: The patient is from: Home              Anticipated d/c is to: Home              Anticipated d/c date is: > 3 days              Patient currently is not medically stable to d/c.  Needs to complete a full course of IV antibiotics to treat bacteremia and endocarditis.  Patient is an IV drug abuser and it is unsafe for patient to discharge outside of hospital with PICC line in place.   Code Status: Full Family Communication: Patient only DVT prophylaxis: Lovenox Vaccination status: Has not received Covid vaccine; was last immunized against influenza in 2018 and did receive pneumococcal vaccine September 2021  HPI: 44 year old male with history of polysubstance use including IV drug use, bipolar disorder came to ED for evaluation of abnormal blood cultures. Patient was seen in [facility] on 03/06/2020 for evaluation management of heroin withdrawal.  He was treated supportively.  Blood cultures were obtained and grew MRSA and Serratia marcescens.  He was called from home to come to ED for further evaluation.  CTA chest PE showed multiple cavitary and noncavitary nodules consistent with septic emboli, bilateral lower lobe pneumonia and bilateral hilar adenopathy felt to be reactive.  No pulmonary embolus was reported.  Patient was started on IV ceftriaxone and vancomycin.  TTE was performed which showed shaggy vegetations on tricuspid valve.  TEE on 03/15/2020 showed 2 x 2 cm long vegetation on tricuspid valve with resultant severe TR.  MRI left shoulder showed septic arthropathy, myositis and tenosynovitis.  He underwent washout of left shoulder by orthopedic surgery on 03/15/2020.  He was transferred to [facility] on 03/17/2020 for  further evaluation of endocarditis by cardiothoracic surgery.  Subjective: Awake.  No complaints.  Objective: Vitals:   03/29/20 0517 03/29/20 0730  BP: 112/74 104/82  Pulse: 96 90  Resp: 19 14  Temp: 98.7 F (37.1 C) 98.3 F (36.8 C)  SpO2: 99% 100%    Intake/Output Summary (Last 24 hours) at 03/29/2020 1304 Last data filed at 03/29/2020 2297 Gross per 24 hour  Intake 10 ml  Output 1100 ml  Net -1090 ml   Filed Weights   03/26/20 0402 03/28/20 0500 03/29/20 0517  Weight: 60.3 kg 61 kg 57.1 kg    Exam:  Constitutional: NAD, calm, comfortable Respiratory: clear to auscultation bilaterally, no wheezing, no crackles. Normal respiratory effort. No accessory muscle use. RA  Cardiovascular: Regular rate and rhythm, no murmurs / rubs / gallops. No extremity edema. 2+ pedal pulses.  Abdomen: no tenderness, no masses palpated. No hepatosplenomegaly. Bowel sounds positive.  Musculoskeletal: no clubbing / cyanosis. No joint deformity upper and lower extremities. Normal muscle tone.  Neurologic: CN 2-12 grossly intact. Sensation intact, DTR normal. Strength 5/5 x all 4 extremities.  Psychiatric: Normal judgment and insight. Alert and oriented x 3. Normal mood.    Assessment/Plan: Severe sepsis secondary to MRSA and Serratia bacteremia-pulmonary septic emboli, TV endocarditis, left shoulder septic arthritis.   Patient is status post left shoulder arthroscopy with debridement irrigation for septic arthritis as per orthopedics on 03/15/2020.  Echocardiogram showed tricuspid vegetation.  TEE showed 2x2 mobile vegetation  with severe TR.   MRI lumbar spine showed no evidence of epidural abscess.  Patient refused MRI right shoulder ordered by ID.   CT surgery was consulted - s/p angiovac debridement on 03/21/2020.  Infectious disease following.  Continue IV vancomycin, Rocephin was changed to cefepime per ID. Repeat blood cultures drawn on 9/8 are negative.  ID recommendsed 4 more weeks of IV  antibiotic therapy starting from 03/21/2020 with vancomycin and cefepime till 04/18/2020.  Rec call ID back in 2 weeks to reassess whether he requires outpatient regimen. Orthopedic team to remove Steri-Strips this week -otherwise activity as tolerated  TV endocarditis-s/p Angiovac   CT surgery following.  Hyponatremia sodium is stable at 133 as of 9/23 Has been low since last month.   Serum osmolality is 281.  Substance use disorder Ongoing IV heroin use, last use was 3 days prior to admission.   Patient started on Suboxone 8/2 mg twice daily with as needed Suboxone per opiate withdrawal protocol.   Poke with Dr. Erlinda Hong with Suboxone clinic who agreed patient is an appropriate candidate.  He requested that patient be given number to contact [facility] arrange for outpatient follow-up; patient has completed this request. OUD intake assessment completed by telephone. Patient has been scheduled for 04/24/2020 at 9:45 with Dr. Nedra Hai.  HIV antibodies were nonreactive Hepatitis C antibodies were reactive. Hepatitis C genotype 1b.  Patient can follow-up with GI as outpatient for hepatitis C.  Thrombocytopenia Likely due to sepsis.   Platelet count has improved to 136,000  Anemia Hemoglobin has improved to 10.2.   Patient's hemoglobin dropped after surgery, and was transfused 1 unit PRBC.    Anemia panel obtained shows iron 25, saturation 13%, TIBC 195, B12 440, reticulocyte count percent 6.1, immature fraction 27.8.   9/22 have started 3 times daily iron replacement with twice daily scheduled Colace to prevent iron-induced constipation Haptoglobin is normal at 202    Data Reviewed: Basic Metabolic Panel: Recent Labs  Lab 03/26/20 0542 03/28/20 0500  NA 129* 133*  K 4.6 4.0  CL 98 98  CO2 23 29  GLUCOSE 106* 99  BUN 20 23*  CREATININE 0.60* 0.54*  CALCIUM 9.1 9.1   Liver Function Tests: No results for input(s): AST, ALT, ALKPHOS, BILITOT, PROT, ALBUMIN in  the last 168 hours. No results for input(s): LIPASE, AMYLASE in the last 168 hours. No results for input(s): AMMONIA in the last 168 hours. CBC: Recent Labs  Lab 03/26/20 0542  WBC 12.4*  HGB 10.2*  HCT 32.1*  MCV 90.4  PLT 165   Cardiac Enzymes: No results for input(s): CKTOTAL, CKMB, CKMBINDEX, TROPONINI in the last 168 hours. BNP (last 3 results) No results for input(s): BNP in the last 8760 hours.  ProBNP (last 3 results) No results for input(s): PROBNP in the last 8760 hours.  CBG: Recent Labs  Lab 03/25/20 0747  GLUCAP 101*    Recent Results (from the past 240 hour(s))  Surgical pcr screen     Status: None   Collection Time: 03/21/20  3:37 AM   Specimen: Nasal Mucosa; Nasal Swab  Result Value Ref Range Status   MRSA, PCR NEGATIVE NEGATIVE Final   Staphylococcus aureus NEGATIVE NEGATIVE Final    Comment: (NOTE) The Xpert SA Assay (FDA approved for NASAL specimens in patients 21 years of age and older), is one component of a comprehensive surveillance program. It is not intended to diagnose infection nor to guide or monitor treatment. Performed at  [facility], [address] [place], [place] [address]   Fungus Culture With Stain     Status: None (Preliminary result)   Collection Time: 03/21/20  9:21 AM   Specimen: PATH Other; Tissue  Result Value Ref Range Status   Fungus Stain Final report  Final    Comment: (NOTE) Performed At: [facility] [address] [place], [place] [id number] Jolene SchimkeNagendra Sanjai MD OZ:[id number]:[phone]    Fungus (Mycology) Culture PENDING  Incomplete   Fungal Source TISSUE  Final    Comment: VEGETATION OF TRICUSPID VALVE SPEC A Performed at [facility], [address] [place], [place] [address]   Aerobic/Anaerobic Culture (surgical/deep wound)     Status: None   Collection Time: 03/21/20  9:21 AM   Specimen: PATH Other; Tissue  Result Value Ref Range Status   Specimen Description TISSUE  Final   Special Requests  VEGETATION OF TRICUSPID VALVE SPEC A  Final   Gram Stain   Final    FEW WBC PRESENT, PREDOMINANTLY PMN RARE GRAM POSITIVE COCCI    Culture   Final    RARE METHICILLIN RESISTANT STAPHYLOCOCCUS AUREUS NO ANAEROBES ISOLATED Performed at [facility], [address] [place], [place] [address]    Report Status 03/26/2020 FINAL  Final   Organism ID, Bacteria METHICILLIN RESISTANT STAPHYLOCOCCUS AUREUS  Final      Susceptibility   Methicillin resistant staphylococcus aureus - MIC*    CIPROFLOXACIN <=0.5 SENSITIVE Sensitive     ERYTHROMYCIN >=8 RESISTANT Resistant     GENTAMICIN <=0.5 SENSITIVE Sensitive     OXACILLIN >=4 RESISTANT Resistant     TETRACYCLINE <=1 SENSITIVE Sensitive     VANCOMYCIN 1 SENSITIVE Sensitive     TRIMETH/SULFA <=10 SENSITIVE Sensitive     CLINDAMYCIN <=0.25 SENSITIVE Sensitive     RIFAMPIN <=0.5 SENSITIVE Sensitive     Inducible Clindamycin NEGATIVE Sensitive     * RARE METHICILLIN RESISTANT STAPHYLOCOCCUS AUREUS  Acid Fast Smear (AFB)     Status: None   Collection Time: 03/21/20  9:21 AM   Specimen: PATH Other; Tissue  Result Value Ref Range Status   AFB Specimen Processing Concentration  Final   Acid Fast Smear Negative  Final    Comment: (NOTE) Performed At: [facility] [address] [place], [place] [id number] Jolene SchimkeNagendra Sanjai MD DG:[id number]:[phone]    Source (AFB) TISSUE  Final    Comment: VEGETATION OF TRICUSPID VALVE SPEC A Performed at [facility], [address] [place], [place] [address]   Fungus Culture Result     Status: None   Collection Time: 03/21/20  9:21 AM  Result Value Ref Range Status   Result 1 Comment  Final    Comment: (NOTE) KOH/Calcofluor preparation:  no fungus observed. Performed At: [facility] [address] [place], [place] [id number] Jolene SchimkeNagendra Sanjai MD JO:[id number]:[phone]      Studies: No results found.  Scheduled Meds: . buprenorphine-naloxone  1 tablet Sublingual BID  . Chlorhexidine  Gluconate Cloth  6 each Topical Daily  . docusate sodium  100 mg Oral BID  . enoxaparin (LOVENOX) injection  40 mg Subcutaneous Q24H  . feeding supplement (ENSURE ENLIVE)  237 mL Oral TID BM  . ferrous sulfate  325 mg Oral TID WC  . multivitamin with minerals  1 tablet Oral Daily  . sodium chloride flush  10-40 mL Intracatheter Q12H   Continuous Infusions: . ceFEPime (MAXIPIME) IV 2 g (03/29/20 0528)  . vancomycin 1,250  mg (03/29/20 0745)    Principal Problem:   MRSA bacteremia Active Problems:   Polysubstance dependence (HCC)   Hypokalemia   Thrombocytopenia (HCC)   Serratia septicemia (HCC)   Bipolar 1 disorder (HCC)   Heroin abuse (HCC)   Arthritis, septic, shoulder (HCC)   Endocarditis of tricuspid valve   Hepatitis C virus infection   IVDU (intravenous drug user)   Normocytic anemia   Cigarette smoker   Septic pulmonary embolism (HCC)   Pressure injury of skin   Lumbar back pain   Consultants:  Orthopedics  CVTS  Infectious disease  Procedures: 9/7 2D echocardiogram: 1. Left ventricular ejection fraction, by estimation, is 60 to 65%. The left ventricle has normal function. The left ventricle has no regional wall motion abnormalities. Left ventricular diastolic function could not be evaluated. 2. Right ventricular systolic function is normal. The right ventricular size is normal. There is normal pulmonary artery systolic pressure. The estimated right ventricular systolic pressure is 31.5 mmHg. 3. The mitral valve is normal in structure. No evidence of mitral valve regurgitation. No evidence of mitral stenosis. 4. There are several large shaggy mobile densities one of which measures 1.27 x 1.54cm on the TV worrisome for vegetations. The tricuspid valve is abnormal. 5. The aortic valve is normal in structure. Aortic valve regurgitation is not visualized. No aortic stenosis is present. 6. The inferior vena cava is normal in size with greater than 50% respiratory  variability, suggesting right atrial pressure of 3 mmHg.  9/10 TEE: 1. Tricuspid valve endocarditis. 2. Left ventricular ejection fraction, by estimation, is 60 to 65%. The left ventricle has normal function. 3. Right ventricular systolic function is normal. The right ventricular size is normal. 4. No left atrial/left atrial appendage thrombus was detected. 5. Right atrial size was mildly dilated. 6. A small pericardial effusion is present. The pericardial effusion is circumferential. There is no evidence of cardiac tamponade. 7. The mitral valve is normal in structure. Trivial mitral valve regurgitation. 8. There are 2 separate elongated valvular vegetations along the medial and lateral tricuspid valve leaflets that are approximately 2 cm in length, residing mostly in the right atrial surface. There is associated severe tricuspid regurgitation.. The tricuspid valve is abnormal. Tricuspid valve regurgitation is severe. 9. The aortic valve is normal in structure. Aortic valve regurgitation is not visualized. 10. There are very small shaggy thin mobile echodensities on the pulmonic valve surface. Trivial pulmonic valve regurgitation. This may represent pulmonic  9/10 left shoulder arthroscopy with extensive debridement, I/D, excisional debridement of the labrum and bursa as well as left shoulder subacromial bursectomy  9/16 application of angio VAC with intraoperative TEE: Right heart cannulation - Debridement of right atrial mass - Debridement of tricuspid valve vegetation  Antibiotics: Anti-infectives (From admission, onward)   Start     Dose/Rate Route Frequency Ordered Stop   03/22/20 2000  vancomycin (VANCOREADY) IVPB 1250 mg/250 mL        1,250 mg 166.7 mL/hr over 90 Minutes Intravenous Every 12 hours 03/22/20 1435 04/23/20 0759   03/20/20 1400  ceFEPIme (MAXIPIME) 2 g in sodium chloride 0.9 % 100 mL IVPB        2 g 200 mL/hr over 30 Minutes Intravenous Every 8 hours 03/20/20 0912  04/22/20 2359   03/19/20 2200  vancomycin (VANCOCIN) IVPB 1000 mg/200 mL premix  Status:  Discontinued        1,000 mg 200 mL/hr over 60 Minutes Intravenous Every 12 hours 03/19/20 1258 03/22/20 1435  03/18/20 1400  cefTRIAXone (ROCEPHIN) 2 g in sodium chloride 0.9 % 100 mL IVPB  Status:  Discontinued        2 g 200 mL/hr over 30 Minutes Intravenous Every 24 hours 03/18/20 1105 03/20/20 0909   03/12/20 2000  cefTRIAXone (ROCEPHIN) 2 g in sodium chloride 0.9 % 100 mL IVPB  Status:  Discontinued        2 g 200 mL/hr over 30 Minutes Intravenous Every 24 hours 03/11/20 2058 03/12/20 0937   03/12/20 1400  ceFEPIme (MAXIPIME) 2 g in sodium chloride 0.9 % 100 mL IVPB  Status:  Discontinued        2 g 200 mL/hr over 30 Minutes Intravenous Every 8 hours 03/12/20 0937 03/18/20 1105   03/12/20 0000  vancomycin (VANCOCIN) IVPB 1000 mg/200 mL premix  Status:  Discontinued        1,000 mg 200 mL/hr over 60 Minutes Intravenous Every 8 hours 03/11/20 1816 03/19/20 1258   03/11/20 1830  cefTRIAXone (ROCEPHIN) 2 g in sodium chloride 0.9 % 100 mL IVPB        2 g 200 mL/hr over 30 Minutes Intravenous  Once 03/11/20 1822 03/11/20 2030   03/11/20 1545  vancomycin (VANCOREADY) IVPB 1500 mg/300 mL        1,500 mg 150 mL/hr over 120 Minutes Intravenous  Once 03/11/20 1532 03/11/20 1819       Time spent: 15 minutes    Junious Silk ANP  [facility] Pager [phone]. If 7PM-7AM, please contact night-coverage at [email or web address] 03/29/2020, 1:04 PM  LOS: 18 days

## 2020-03-29 NOTE — Progress Notes (Signed)
Pharmacy Antibiotic Note  ODYN TURKO is a 44 y.o. 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 Angiovac procedure completed 9/16. Additionally, patient has L shoulder septic arthritis s/p I&D 9/10 with culture growing rare MRSA. Pharmacy has been consulted for cefepime dosing for Serratia bacteremia/endocarditis (given ampC organism).  Cr has remained stable, vancomycin trough therapeutic earlier this week.  Plan: Continue cefepime 2g q8 hours Continue vancomycin 1250mg  q12h Check surveillance vancomycin trough next week   Height: 5\' 11"  (180.3 cm) Weight: 57.1 kg (125 lb 14.1 oz) IBW/kg (Calculated) : 75.3  Temp (24hrs), Avg:98.6 F (37 C), Min:98.2 F (36.8 C), Max:99 F (37.2 C)  Recent Labs  Lab 03/22/20 1030 03/22/20 1144 03/26/20 0542 03/26/20 0654 03/28/20 0500  WBC  --   --  12.4*  --   --   CREATININE  --  0.64 0.60*  --  0.54*  VANCOTROUGH 14*  --   --  20  --     Estimated Creatinine Clearance: 93.2 mL/min (A) (by C-G formula based on SCr of 0.54 mg/dL (L)).    Allergies  Allergen Reactions  . Tramadol Other (See Comments)    Upset stomach   Antimicrobials this admission: Vancomycin 9/6 >>  Ceftriaxone 9/6 >>9/7, 9/13>>9/15 Cefepime 9/7 >>9/13, 9/15>>   Microbiology results: 9/1 BCx x1: MRSA 9/1 BCx x1: Serratia Marcescens (S= Bactrim, Cipro; R=Cefazolin) 9/6 UCx: mult sp F 9/6 BCx: MRSA F 9/6 COVID: neg 9/8 Hep A reactive 9/8 Hep B NR 9/8 Hep C reactive 9/8 BCx2 ngtd 9/10 MRSA PCR neg  9/10 L shoulder synovial fluid: rare MRSA 9/16 tissue/veg: MRSA  Thank you for allowing pharmacy to be a part of this patient's care.  11/8, PharmD, BCPS Clinical Pharmacist [phone] Please check AMION for all [facility] Pharmacy numbers 03/29/2020

## 2020-03-29 NOTE — Progress Notes (Signed)
     Subjective:  Patient states left shoulder pain is improving, happy with improvement in range of motion as well. Denies parasthesias. Denies pain or swelling in another joint.   Objective:  PE: VITALS:   Vitals:   03/28/20 1843 03/28/20 2037 03/29/20 0517 03/29/20 0730  BP: 109/80 107/75 112/74 104/82  Pulse: 97 (!) 101 96 90  Resp: 14 15 19 14   Temp: 98.2 F (36.8 C) 99 F (37.2 C) 98.7 F (37.1 C) 98.3 F (36.8 C)  TempSrc: Oral Oral Oral Oral  SpO2: 99% 98% 99% 100%  Weight:   57.1 kg   Height:       General: laying in bed, in no acute distress MSK: 0-50 degrees AROM of left shoulder.No TTP to shoulder. Steristrips removed, surgical wounds well opposed.  Full ROM through left elbow, wrist, and all fingers of left hand. Distal sensation intact.  LABS  No results found for this or any previous visit (from the past 24 hour(s)).  No results found.  Assessment/Plan:  L shoulder septic arthritis: - 14 days post-op L shoulder irrigation and debridement, ROM and pain has improved - steri strips removed, ok to leave open to air - continue PT/OT - WBAT as tolerated, can perform motion as tolerated  Will continue to follow along intermittently   03/29/2020, 3:27 PM

## 2020-03-29 NOTE — Progress Notes (Signed)
RN received call from central telemetry that pt had 11 runs of Vtach show up on the monitor at 1638. I went in pt room to assess pt. Pt asymptomatic and alert and oriented x4. BP 107/77, HR 97 bpm, and respirations 16. Pt in lying position. Will continue to monitor.

## 2020-03-30 DIAGNOSIS — F1990 Other psychoactive substance use, unspecified, uncomplicated: Secondary | ICD-10-CM

## 2020-03-30 NOTE — Progress Notes (Signed)
Occupational Therapy Treatment Patient Details Name: Robert Kramer MRN: [id number] DOB: [birth year] Today's Date: 03/30/2020    History of present illness Pt is a 44 y.o. male admitted 03/11/20 for evaluation of abnormal blood cultures. Chest CTA shows multiple cavitary and noncavitary nodules consistent with septic emboli; bilateral PNA, bilateral hilar adenopathy. 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 Pt with L shoulder septic arthritis s/p I&D 9/10. S/p catheter-based debridement of tricuspid valve 9/16. PMH includes IVDA, bipolar disorder.  Underwent AngioVac procedure 9/16.   OT comments  Pt. Seen for skilled OT treatment session.  Focus of session cont. Work with BUE HEP.  Pt. Began with supine [name] exercises for shoulder ROM. Transitioned into sitting with use of log roll secondary to c/o neck pain.  Seated eob for completion of remaining exercises.  Guided through head and neck stretches. Pt. Tolerated well. Assisted with bed positioning and pillow placement to bring head/neck in more neutral position as pt. Has tendency for fllexed position/posturing.  Will cont. With current poc next session.    Follow Up Recommendations       Equipment Recommendations       Recommendations for Other Services      Precautions / Restrictions Precautions Precautions: Fall Restrictions Other Position/Activity Restrictions: AROM as tolerated       Mobility Bed Mobility Overal bed mobility: Modified Independent Bed Mobility: Rolling;Sit to Sidelying;Sidelying to Sit Rolling: Min guard Sidelying to sit: Min guard     Sit to sidelying: Min guard General bed mobility comments: educated in log roll tech. secondary to c/o of increased neck pain and stiffness. cues for sequencing and tech. pt. able to complete  Transfers     Transfers: Sit to/from Stand Sit to Stand: Min guard         General transfer comment: had pt. stand beside bed and side step towards hob  prior to lying down for better bed positioning    Balance                                           ADL either performed or assessed with clinical judgement   ADL                                               Vision       Perception     Praxis      Cognition Arousal/Alertness: Awake/alert Behavior During Therapy: WFL for tasks assessed/performed Overall Cognitive Status: Within Functional Limits for tasks assessed                                          Exercises General Exercises - Upper Extremity Shoulder Flexion: [name];Left;20 reps;Supine Shoulder Extension: [name];Left;10 reps;Supine Shoulder Exercises Shoulder Flexion: AROM;[name];Seated;10 reps Shoulder ABduction: AROM;[name];Left;Seated Shoulder External Rotation: AROM;[name];Left;Seated Other Exercises Other Exercises: guided pt. through head/neck stretches and rom secondary to c/o pain and stiffness "you know when you just sleep on it wrong sometimes" tolerated well. educated on head positioniong on pillow also to ease flexed position he tends to be in   Shoulder Instructions       General Comments  Pertinent Vitals/ Pain       Pain Assessment: Faces Faces Pain Scale: Hurts a little bit Pain Location: neck Pain Descriptors / Indicators: Aching;Grimacing Pain Intervention(s): Limited activity within patient's tolerance;Repositioned  Home Living                                          Prior Functioning/Environment              Frequency  Min 2X/week        Progress Toward Goals  OT Goals(current goals can now be found in the care plan section)  Progress towards OT goals: Progressing toward goals     Plan Frequency remains appropriate    Co-evaluation                 AM-PAC OT "6 Clicks" Daily Activity     Outcome Measure   Help from another person eating meals?: None Help from another person taking  care of personal grooming?: None Help from another person toileting, which includes using toliet, bedpan, or urinal?: A Little Help from another person bathing (including washing, rinsing, drying)?: A Little Help from another person to put on and taking off regular upper body clothing?: None Help from another person to put on and taking off regular lower body clothing?: None 6 Click Score: 22    End of Session    OT Visit Diagnosis: Pain Pain - Right/Left: Left Pain - part of body: Shoulder   Activity Tolerance Patient tolerated treatment well   Patient Left in bed;with call bell/phone within reach   Nurse Communication          Time: 1010-1020 OT Time Calculation (min): 10 min  Charges: OT General Charges $OT Visit: 1 Visit OT Treatments $Therapeutic Exercise: 8-22 mins  Robert Kramer, COTA/L Acute Rehabilitation [phone]   Robert Kramer 03/30/2020, 2:01 PM

## 2020-03-30 NOTE — Progress Notes (Signed)
Patient ID: YISROEL MULLENDORE, male   DOB: 04/18/1976, 44 y.o.   MRN: [id number]  PROGRESS NOTE    JOLON DEGANTE  ZRA:[id number] DOB: [birth year] DOA: 03/11/2020 PCP: Patient, No Pcp Per   Brief Narrative:  44 y.o.malewith medical history significant forpolysubstance use disorder including injection drug use and bipolar disorder presented to the ED for evaluation ofabnormal blood cultures.  Patient was seen in the [facility] ED 03/06/2020 for evaluation and management of heroin withdrawal. He was treated supportively.  Blood cultures were obtained subsequently grew MRSA and Serratia marcescens.  He was subsequently called to the hospital for admission. CTA chest PE study shows multiple cavitary and noncavitary nodules consistent with septic emboli, bilateral lower lobe pneumonia, mild bilateral hilar adenopathy felt to be reactive with no pulmonary emboli. He was started on IV vancomycin and ceftriaxone.  TTE showed shaggy vegetations on the tricuspid valve.  TEE on 03/15/2020 revealed 2 x 2cm long vegetations on the tricuspid valve with resultant severe TR. MRI of left shoulder demonstrated septic arthropathy, myositis, and tenosynovitis. He underwent wash out of the left shoulder by orthopedic surgery on 03/15/20.  He was then transferred to [facility] on 03/17/20 for further evaluation of his endocarditis by cardiothoracic surgery.  He underwent Angiovac debridement on 03/21/2020 by CT surgery.  ID has recommended antibiotic treatment with cefepime and vancomycin till 04/18/2020.   Assessment & Plan:   Severe sepsis: Present on admission MRSA and Serratia bacteremia Pulmonary septic emboli Tricuspid valve endocarditis Left shoulder septic arthritis -Status post left shoulder arthroscopy with debridement/irrigation for septic arthritis by orthopedics on 03/15/2020.  Orthopedics following.  Steri-Strips removed on 03/29/2020.  Orthopedics recommended weightbearing as tolerated, can perform motion  as tolerated. -Echo showed tricuspid vegetation.  TEE showed 2 x 2 centimeter mobile vegetations with severe TR -MRI lumbar spine showed no evidence of epidural abscess.  Patient refused MRI right shoulder ordered by ID. -Repeat blood cultures have been negative so far. -Status post angiovac debridement by CT surgery on 03/21/2020 -ID recommended IV antibiotics with cefepime and vancomycin till 04/18/2020: Recommended to call ID back in 2 weeks to reassess whether he requires outpatient regimen  Hyponatremia -Improving.  Sodium 133 on 03/28/2020.  Substance use disorder -Ongoing IV heroin abuse.  Last use was 3 days prior to admission -He has been started on Suboxone on 8-2 twice a day.  Patient has been scheduled for outpatient follow-up with Suboxone clinic on 04/24/2020 at 9:45 AM with Dr. Nedra Hai -HIV nonreactive. 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  Last platelet one sixty-five on 03/26/2020  Leukocytosis -Mild.  Last WBC was 12.4 on 03/26/2020  Anemia of chronic disease -/Hemoglobin was 10.2 on 03/26/2020. -Currently on iron supplementation as well   DVT prophylaxis: Lovenox Code Status: Full Family Communication: None at bedside Disposition Plan: Status is: Inpatient  Remains inpatient appropriate because:Inpatient level of care appropriate due to severity of illness   Dispo: The patient is from: Home              Anticipated d/c is to: Home              Anticipated d/c date is: > 3 days              Patient currently is not medically stable to d/c.   Consultants: ID/cardiothoracic surgery/orthopedic  Procedures:   9/7 2D echocardiogram: 1. Left ventricular ejection fraction,  by estimation, is 60 to 65%. The left ventricle has normal function. The left ventricle has no regional wall motion abnormalities. Left ventricular diastolic function could not be evaluated. 2.  Right ventricular systolic function is normal. The right ventricular size is normal. There is normal pulmonary artery systolic pressure. The estimated right ventricular systolic pressure is 31.5 mmHg. 3. The mitral valve is normal in structure. No evidence of mitral valve regurgitation. No evidence of mitral stenosis. 4. There are several large shaggy mobile densities one of which measures 1.27 x 1.54cm on the TV worrisome for vegetations. The tricuspid valve is abnormal. 5. The aortic valve is normal in structure. Aortic valve regurgitation is not visualized. No aortic stenosis is present. 6. The inferior vena cava is normal in size with greater than 50% respiratory variability, suggesting right atrial pressure of 3 mmHg.  9/10 TEE: 1. Tricuspid valve endocarditis. 2. Left ventricular ejection fraction, by estimation, is 60 to 65%. The left ventricle has normal function. 3. Right ventricular systolic function is normal. The right ventricular size is normal. 4. No left atrial/left atrial appendage thrombus was detected. 5. Right atrial size was mildly dilated. 6. A small pericardial effusion is present. The pericardial effusion is circumferential. There is no evidence of cardiac tamponade. 7. The mitral valve is normal in structure. Trivial mitral valve regurgitation. 8. There are 2 separate elongated valvular vegetations along the medial and lateral tricuspid valve leaflets that are approximately 2 cm in length, residing mostly in the right atrial surface. There is associated severe tricuspid regurgitation.. The tricuspid valve is abnormal. Tricuspid valve regurgitation is severe. 9. The aortic valve is normal in structure. Aortic valve regurgitation is not visualized. 10. There are very small shaggy thin mobile echodensities on the pulmonic valve surface. Trivial pulmonic valve regurgitation. This may represent pulmonic  9/10 left shoulder arthroscopy with extensive debridement, I/D,  excisional debridement of the labrum and bursa as well as left shoulder subacromial bursectomy  9/16 application of angio VAC with intraoperative TEE: Right heart cannulation - Debridement of right atrial mass - Debridement of tricuspid valve vegetation  Antimicrobials:  Anti-infectives (From admission, onward)   Start     Dose/Rate Route Frequency Ordered Stop   03/22/20 2000  vancomycin (VANCOREADY) IVPB 1250 mg/250 mL        1,250 mg 166.7 mL/hr over 90 Minutes Intravenous Every 12 hours 03/22/20 1435 04/23/20 0759   03/20/20 1400  ceFEPIme (MAXIPIME) 2 g in sodium chloride 0.9 % 100 mL IVPB        2 g 200 mL/hr over 30 Minutes Intravenous Every 8 hours 03/20/20 0912 04/22/20 2359   03/19/20 2200  vancomycin (VANCOCIN) IVPB 1000 mg/200 mL premix  Status:  Discontinued        1,000 mg 200 mL/hr over 60 Minutes Intravenous Every 12 hours 03/19/20 1258 03/22/20 1435   03/18/20 1400  cefTRIAXone (ROCEPHIN) 2 g in sodium chloride 0.9 % 100 mL IVPB  Status:  Discontinued        2 g 200 mL/hr over 30 Minutes Intravenous Every 24 hours 03/18/20 1105 03/20/20 0909   03/12/20 2000  cefTRIAXone (ROCEPHIN) 2 g in sodium chloride 0.9 % 100 mL IVPB  Status:  Discontinued        2 g 200 mL/hr over 30 Minutes Intravenous Every 24 hours 03/11/20 2058 03/12/20 0937   03/12/20 1400  ceFEPIme (MAXIPIME) 2 g in sodium chloride 0.9 % 100 mL IVPB  Status:  Discontinued  2 g 200 mL/hr over 30 Minutes Intravenous Every 8 hours 03/12/20 0937 03/18/20 1105   03/12/20 0000  vancomycin (VANCOCIN) IVPB 1000 mg/200 mL premix  Status:  Discontinued        1,000 mg 200 mL/hr over 60 Minutes Intravenous Every 8 hours 03/11/20 1816 03/19/20 1258   03/11/20 1830  cefTRIAXone (ROCEPHIN) 2 g in sodium chloride 0.9 % 100 mL IVPB        2 g 200 mL/hr over 30 Minutes Intravenous  Once 03/11/20 1822 03/11/20 2030   03/11/20 1545  vancomycin (VANCOREADY) IVPB 1500 mg/300 mL        1,500 mg 150 mL/hr over 120  Minutes Intravenous  Once 03/11/20 1532 03/11/20 1819        Subjective: Patient seen and examined at bedside.  Denies any new complaints.  No overnight fever, vomiting, worsening shortness of breath.  Objective: Vitals:   03/29/20 2056 03/29/20 2300 03/30/20 0236 03/30/20 0533  BP: 106/84 110/68 107/73   Pulse: 97 98 94   Resp: 20  16   Temp: 99.7 F (37.6 C) 98 F (36.7 C) 98.8 F (37.1 C)   TempSrc: Oral Oral Oral   SpO2: 98% 98% 96%   Weight:    59.6 kg  Height:        Intake/Output Summary (Last 24 hours) at 03/30/2020 0809 Last data filed at 03/29/2020 1300 Gross per 24 hour  Intake 382 ml  Output 500 ml  Net -118 ml   Filed Weights   03/28/20 0500 03/29/20 0517 03/30/20 0533  Weight: 61 kg 57.1 kg 59.6 kg    Examination:  General exam: Appears calm and comfortable.  Looks chronically ill and older than stated age. Respiratory system: Bilateral decreased breath sounds at bases with scattered crackles Cardiovascular system: S1 & S2 heard, Rate controlled Gastrointestinal system: Abdomen is nondistended, soft and nontender. Normal bowel sounds heard. Extremities: No cyanosis, clubbing; trace lower extremity edema Central nervous system: Alert and oriented. No focal neurological deficits. Moving extremities Skin: No rashes, lesions or ulcers Psychiatry: Flat affect    Data Reviewed: I have personally reviewed following labs and imaging studies  CBC: Recent Labs  Lab 03/26/20 0542  WBC 12.4*  HGB 10.2*  HCT 32.1*  MCV 90.4  PLT 165   Basic Metabolic Panel: Recent Labs  Lab 03/26/20 0542 03/28/20 0500  NA 129* 133*  K 4.6 4.0  CL 98 98  CO2 23 29  GLUCOSE 106* 99  BUN 20 23*  CREATININE 0.60* 0.54*  CALCIUM 9.1 9.1   GFR: Estimated Creatinine Clearance: 97.3 mL/min (A) (by C-G formula based on SCr of 0.54 mg/dL (L)). Liver Function Tests: No results for input(s): AST, ALT, ALKPHOS, BILITOT, PROT, ALBUMIN in the last 168 hours. No results  for input(s): LIPASE, AMYLASE in the last 168 hours. No results for input(s): AMMONIA in the last 168 hours. Coagulation Profile: Recent Labs  Lab 03/26/20 0834  INR 1.4*   Cardiac Enzymes: No results for input(s): CKTOTAL, CKMB, CKMBINDEX, TROPONINI in the last 168 hours. BNP (last 3 results) No results for input(s): PROBNP in the last 8760 hours. HbA1C: No results for input(s): HGBA1C in the last 72 hours. CBG: Recent Labs  Lab 03/25/20 0747  GLUCAP 101*   Lipid Profile: No results for input(s): CHOL, HDL, LDLCALC, TRIG, CHOLHDL, LDLDIRECT in the last 72 hours. Thyroid Function Tests: No results for input(s): TSH, T4TOTAL, FREET4, T3FREE, THYROIDAB in the last 72 hours. Anemia Panel: No results  for input(s): VITAMINB12, FOLATE, FERRITIN, TIBC, IRON, RETICCTPCT in the last 72 hours. Sepsis Labs: No results for input(s): PROCALCITON, LATICACIDVEN in the last 168 hours.  Recent Results (from the past 240 hour(s))  Surgical pcr screen     Status: None   Collection Time: 03/21/20  3:37 AM   Specimen: Nasal Mucosa; Nasal Swab  Result Value Ref Range Status   MRSA, PCR NEGATIVE NEGATIVE Final   Staphylococcus aureus NEGATIVE NEGATIVE Final    Comment: (NOTE) The Xpert SA Assay (FDA approved for NASAL specimens in patients 46 years of age and older), is one component of a comprehensive surveillance program. It is not intended to diagnose infection nor to guide or monitor treatment. Performed at [facility], [address] [place], [place] [address]   Fungus Culture With Stain     Status: None (Preliminary result)   Collection Time: 03/21/20  9:21 AM   Specimen: PATH Other; Tissue  Result Value Ref Range Status   Fungus Stain Final report  Final    Comment: (NOTE) Performed At: [facility] [address] [place], [place] [id number] Jolene SchimkeNagendra Sanjai MD XB:[id number]:[phone]    Fungus (Mycology) Culture PENDING  Incomplete   Fungal Source TISSUE  Final     Comment: VEGETATION OF TRICUSPID VALVE SPEC A Performed at [facility], [address] [place], [place] [address]   Aerobic/Anaerobic Culture (surgical/deep wound)     Status: None   Collection Time: 03/21/20  9:21 AM   Specimen: PATH Other; Tissue  Result Value Ref Range Status   Specimen Description TISSUE  Final   Special Requests VEGETATION OF TRICUSPID VALVE SPEC A  Final   Gram Stain   Final    FEW WBC PRESENT, PREDOMINANTLY PMN RARE GRAM POSITIVE COCCI    Culture   Final    RARE METHICILLIN RESISTANT STAPHYLOCOCCUS AUREUS NO ANAEROBES ISOLATED Performed at [facility], [address] [place], [place] [address]    Report Status 03/26/2020 FINAL  Final   Organism ID, Bacteria METHICILLIN RESISTANT STAPHYLOCOCCUS AUREUS  Final      Susceptibility   Methicillin resistant staphylococcus aureus - MIC*    CIPROFLOXACIN <=0.5 SENSITIVE Sensitive     ERYTHROMYCIN >=8 RESISTANT Resistant     GENTAMICIN <=0.5 SENSITIVE Sensitive     OXACILLIN >=4 RESISTANT Resistant     TETRACYCLINE <=1 SENSITIVE Sensitive     VANCOMYCIN 1 SENSITIVE Sensitive     TRIMETH/SULFA <=10 SENSITIVE Sensitive     CLINDAMYCIN <=0.25 SENSITIVE Sensitive     RIFAMPIN <=0.5 SENSITIVE Sensitive     Inducible Clindamycin NEGATIVE Sensitive     * RARE METHICILLIN RESISTANT STAPHYLOCOCCUS AUREUS  Acid Fast Smear (AFB)     Status: None   Collection Time: 03/21/20  9:21 AM   Specimen: PATH Other; Tissue  Result Value Ref Range Status   AFB Specimen Processing Concentration  Final   Acid Fast Smear Negative  Final    Comment: (NOTE) Performed At: [facility] [address] [place], [place] [id number] Jolene SchimkeNagendra Sanjai MD KG:[id number]:[phone]    Source (AFB) TISSUE  Final    Comment: VEGETATION OF TRICUSPID VALVE SPEC A Performed at [facility], [address] [place], [place] [address]   Fungus Culture Result     Status: None   Collection Time: 03/21/20  9:21 AM  Result Value Ref  Range Status   Result 1 Comment  Final    Comment: (NOTE) KOH/Calcofluor preparation:  no fungus observed. Performed At: [facility] [address] [place], [place] [id number] Jolene Schimke MD WU:[id number]          Radiology Studies: No results found.      Scheduled Meds: . buprenorphine-naloxone  1 tablet Sublingual BID  . Chlorhexidine Gluconate Cloth  6 each Topical Daily  . docusate sodium  100 mg Oral BID  . enoxaparin (LOVENOX) injection  40 mg Subcutaneous Q24H  . feeding supplement (ENSURE ENLIVE)  237 mL Oral TID BM  . ferrous sulfate  325 mg Oral TID WC  . multivitamin with minerals  1 tablet Oral Daily  . sodium chloride flush  10-40 mL Intracatheter Q12H   Continuous Infusions: . ceFEPime (MAXIPIME) IV 2 g (03/30/20 0532)  . vancomycin 1,250 mg (03/29/20 2107)          Glade Lloyd, MD [facility] 03/30/2020, 8:09 AM

## 2020-03-31 NOTE — Progress Notes (Signed)
Patient ID: Robert Kramer, male   DOB: February 14, 1976, 44 y.o.   MRN: [id number]  PROGRESS NOTE    DAVIONNE DOWTY  YKD:[id number] DOB: 1975/09/12 DOA: 03/11/2020 PCP: Patient, No Pcp Per   Brief Narrative:  44 y.o.malewith medical history significant forpolysubstance use disorder including injection drug use and bipolar disorder presented to the ED for evaluation ofabnormal blood cultures.  Patient was seen in the [facility] 03/06/2020 for evaluation and management of heroin withdrawal. He was treated supportively.  Blood cultures were obtained subsequently grew MRSA and Serratia marcescens.  He was subsequently called to the hospital for admission. CTA chest PE study shows multiple cavitary and noncavitary nodules consistent with septic emboli, bilateral lower lobe pneumonia, mild bilateral hilar adenopathy felt to be reactive with no pulmonary emboli. He was started on IV vancomycin and ceftriaxone.  TTE showed shaggy vegetations on the tricuspid valve.  TEE on 03/15/2020 revealed 2 x 2cm long vegetations on the tricuspid valve with resultant severe TR. MRI of left shoulder demonstrated septic arthropathy, myositis, and tenosynovitis. He underwent wash out of the left shoulder by orthopedic surgery on 03/15/20.  He was then transferred to [facility] on 03/17/20 for further evaluation of his endocarditis by cardiothoracic surgery.  He underwent Angiovac debridement on 03/21/2020 by CT surgery.  ID has recommended antibiotic treatment with cefepime and vancomycin till 04/18/2020.   Assessment & Plan:   Severe sepsis: Present on admission MRSA and Serratia bacteremia Pulmonary septic emboli Tricuspid valve endocarditis Left shoulder septic arthritis -Status post left shoulder arthroscopy with debridement/irrigation for septic arthritis by orthopedics on 03/15/2020.  Orthopedics following.  Steri-Strips removed on 03/29/2020.  Orthopedics recommended weightbearing as tolerated, can perform motion  as tolerated. -Echo showed tricuspid vegetation.  TEE showed 2 x 2 centimeter mobile vegetations with severe TR -MRI lumbar spine showed no evidence of epidural abscess.  Patient refused MRI right shoulder ordered by ID. -Repeat blood cultures have been negative so far. -Status post angiovac debridement by CT surgery on 03/21/2020 -ID recommended IV antibiotics with cefepime and vancomycin till 04/18/2020: Recommended to call ID back in 2 weeks to reassess whether he requires outpatient regimen -Monitor labs intermittently. -Currently afebrile and hemodynamically stable.  Hyponatremia -Improving.  Sodium 133 on 03/28/2020.  Substance use disorder -Ongoing IV heroin abuse.  Last use was 3 days prior to admission -He has been started on Suboxone on 8-2 twice a day.  Patient has been scheduled for outpatient follow-up with Suboxone clinic on 04/24/2020 at 9:45 AM with Dr. Nedra Hai -HIV nonreactive. 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  Last platelet 165 on 03/26/2020  Leukocytosis -Mild.  Last WBC was 12.4 on 03/26/2020  Anemia of chronic disease -Hemoglobin was 10.2 on 03/26/2020. -Currently on iron supplementation as well   DVT prophylaxis: Lovenox Code Status: Full Family Communication: None at bedside Disposition Plan: Status is: Inpatient  Remains inpatient appropriate because:Inpatient level of care appropriate due to severity of illness   Dispo: The patient is from: Home              Anticipated d/c is to: Home              Anticipated d/c date is: > 3 days              Patient currently is not medically stable to d/c.   Consultants: ID/cardiothoracic surgery/orthopedic  Procedures:   9/7  2D echocardiogram: 1. Left ventricular ejection fraction, by estimation, is 60 to 65%. The left ventricle has normal function. The left ventricle has no regional wall motion  abnormalities. Left ventricular diastolic function could not be evaluated. 2. Right ventricular systolic function is normal. The right ventricular size is normal. There is normal pulmonary artery systolic pressure. The estimated right ventricular systolic pressure is 31.5 mmHg. 3. The mitral valve is normal in structure. No evidence of mitral valve regurgitation. No evidence of mitral stenosis. 4. There are several large shaggy mobile densities one of which measures 1.27 x 1.54cm on the TV worrisome for vegetations. The tricuspid valve is abnormal. 5. The aortic valve is normal in structure. Aortic valve regurgitation is not visualized. No aortic stenosis is present. 6. The inferior vena cava is normal in size with greater than 50% respiratory variability, suggesting right atrial pressure of 3 mmHg.  9/10 TEE: 1. Tricuspid valve endocarditis. 2. Left ventricular ejection fraction, by estimation, is 60 to 65%. The left ventricle has normal function. 3. Right ventricular systolic function is normal. The right ventricular size is normal. 4. No left atrial/left atrial appendage thrombus was detected. 5. Right atrial size was mildly dilated. 6. A small pericardial effusion is present. The pericardial effusion is circumferential. There is no evidence of cardiac tamponade. 7. The mitral valve is normal in structure. Trivial mitral valve regurgitation. 8. There are 2 separate elongated valvular vegetations along the medial and lateral tricuspid valve leaflets that are approximately 2 cm in length, residing mostly in the right atrial surface. There is associated severe tricuspid regurgitation.. The tricuspid valve is abnormal. Tricuspid valve regurgitation is severe. 9. The aortic valve is normal in structure. Aortic valve regurgitation is not visualized. 10. There are very small shaggy thin mobile echodensities on the pulmonic valve surface. Trivial pulmonic valve regurgitation. This may represent  pulmonic  9/10 left shoulder arthroscopy with extensive debridement, I/D, excisional debridement of the labrum and bursa as well as left shoulder subacromial bursectomy  9/16 application of angio VAC with intraoperative TEE: Right heart cannulation - Debridement of right atrial mass - Debridement of tricuspid valve vegetation  Antimicrobials:  Anti-infectives (From admission, onward)   Start     Dose/Rate Route Frequency Ordered Stop   03/22/20 2000  vancomycin (VANCOREADY) IVPB 1250 mg/250 mL        1,250 mg 166.7 mL/hr over 90 Minutes Intravenous Every 12 hours 03/22/20 1435 04/23/20 0759   03/20/20 1400  ceFEPIme (MAXIPIME) 2 g in sodium chloride 0.9 % 100 mL IVPB        2 g 200 mL/hr over 30 Minutes Intravenous Every 8 hours 03/20/20 0912 04/22/20 2359   03/19/20 2200  vancomycin (VANCOCIN) IVPB 1000 mg/200 mL premix  Status:  Discontinued        1,000 mg 200 mL/hr over 60 Minutes Intravenous Every 12 hours 03/19/20 1258 03/22/20 1435   03/18/20 1400  cefTRIAXone (ROCEPHIN) 2 g in sodium chloride 0.9 % 100 mL IVPB  Status:  Discontinued        2 g 200 mL/hr over 30 Minutes Intravenous Every 24 hours 03/18/20 1105 03/20/20 0909   03/12/20 2000  cefTRIAXone (ROCEPHIN) 2 g in sodium chloride 0.9 % 100 mL IVPB  Status:  Discontinued        2 g 200 mL/hr over 30 Minutes Intravenous Every 24 hours 03/11/20 2058 03/12/20 0937   03/12/20 1400  ceFEPIme (MAXIPIME) 2 g in sodium chloride 0.9 % 100 mL IVPB  Status:  Discontinued        2 g 200 mL/hr over 30 Minutes Intravenous Every 8 hours 03/12/20 0937 03/18/20 1105   03/12/20 0000  vancomycin (VANCOCIN) IVPB 1000 mg/200 mL premix  Status:  Discontinued        1,000 mg 200 mL/hr over 60 Minutes Intravenous Every 8 hours 03/11/20 1816 03/19/20 1258   03/11/20 1830  cefTRIAXone (ROCEPHIN) 2 g in sodium chloride 0.9 % 100 mL IVPB        2 g 200 mL/hr over 30 Minutes Intravenous  Once 03/11/20 1822 03/11/20 2030   03/11/20 1545   vancomycin (VANCOREADY) IVPB 1500 mg/300 mL        1,500 mg 150 mL/hr over 120 Minutes Intravenous  Once 03/11/20 1532 03/11/20 1819        Subjective: Patient seen and examined at bedside.  Poor historian.  No overnight fever, vomiting, worsening shortness of breath reported.  No chest pain reported Objective: Vitals:   03/30/20 0533 03/30/20 1430 03/30/20 1949 03/31/20 0514  BP:  107/75 114/75 109/76  Pulse:  86 (!) 102 94  Resp:  Temp:  97.6 F (36.4 C) 97.8 F (36.6 C) 98.6 F (37 C)  TempSrc:  Oral Oral Oral  SpO2:  100% 98%   Weight: 59.6 kg   60.3 kg  Height:        Intake/Output Summary (Last 24 hours) at 03/31/2020 0751 Last data filed at 03/31/2020 0352 Gross per 24 hour  Intake 1180 ml  Output 1200 ml  Net -20 ml   Filed Weights   03/29/20 0517 03/30/20 0533 03/31/20 0514  Weight: 57.1 kg 59.6 kg 60.3 kg    Examination:  General exam: No acute distress.  Looks chronically ill and older than stated age. Respiratory system: Bilateral decreased breath sounds at bases with some crackles.  No wheezing. Cardiovascular system: Rate controlled, S1-S2 heard Gastrointestinal system: Abdomen is nondistended, soft and nontender.  Bowel sounds are heard.   Extremities: Trace lower extremity edema.  No clubbing or cyanosis. Central nervous system: Awake and oriented.  No focal neurological deficits.  Moves extremities.   Skin: No obvious ecchymosis/lesions Psychiatry: Affect is flat   Data Reviewed: I have personally reviewed following labs and imaging studies  CBC: Recent Labs  Lab 03/26/20 0542  WBC 12.4*  HGB 10.2*  HCT 32.1*  MCV 90.4  PLT 165   Basic Metabolic Panel: Recent Labs  Lab 03/26/20 0542 03/28/20 0500  NA 129* 133*  K 4.6 4.0  CL 98 98  CO2 23 29  GLUCOSE 106* 99  BUN 20 23*  CREATININE 0.60* 0.54*  CALCIUM 9.1 9.1   GFR: Estimated Creatinine Clearance: 98.4 mL/min (A) (by C-G formula based on SCr of 0.54 mg/dL  (L)). Liver Function Tests: No results for input(s): AST, ALT, ALKPHOS, BILITOT, PROT, ALBUMIN in the last 168 hours. No results for input(s): LIPASE, AMYLASE in the last 168 hours. No results for input(s): AMMONIA in the last 168 hours. Coagulation Profile: Recent Labs  Lab 03/26/20 0834  INR 1.4*   Cardiac Enzymes: No results for input(s): CKTOTAL, CKMB, CKMBINDEX, TROPONINI in the last 168 hours. BNP (last 3 results) No results for input(s): PROBNP in the last 8760 hours. HbA1C: No results for input(s): HGBA1C in the last 72 hours. CBG: Recent Labs  Lab 03/25/20 0747  GLUCAP 101*   Lipid Profile: No results for input(s): CHOL, HDL, LDLCALC, TRIG, CHOLHDL, LDLDIRECT in the last 72 hours. Thyroid  Function Tests: No results for input(s): TSH, T4TOTAL, FREET4, T3FREE, THYROIDAB in the last 72 hours. Anemia Panel: No results for input(s): VITAMINB12, FOLATE, FERRITIN, TIBC, IRON, RETICCTPCT in the last 72 hours. Sepsis Labs: No results for input(s): PROCALCITON, LATICACIDVEN in the last 168 hours.  Recent Results (from the past 240 hour(s))  Fungus Culture With Stain     Status: None (Preliminary result)   Collection Time: 03/21/20  9:21 AM   Specimen: PATH Other; Tissue  Result Value Ref Range Status   Fungus Stain Final report  Final    Comment: (NOTE) Performed At: [facility] [place] [address] [place], [place] [id number] Jolene SchimkeNagendra Sanjai MD WU:[id number]:[phone]    Fungus (Mycology) Culture PENDING  Incomplete   Fungal Source TISSUE  Final    Comment: VEGETATION OF TRICUSPID VALVE SPEC A Performed at [facility], [address] [place], [place] [address]   Aerobic/Anaerobic Culture (surgical/deep wound)     Status: None   Collection Time: 03/21/20  9:21 AM   Specimen: PATH Other; Tissue  Result Value Ref Range Status   Specimen Description TISSUE  Final   Special Requests VEGETATION OF TRICUSPID VALVE SPEC A  Final   Gram Stain   Final    FEW WBC PRESENT,  PREDOMINANTLY PMN RARE GRAM POSITIVE COCCI    Culture   Final    RARE METHICILLIN RESISTANT STAPHYLOCOCCUS AUREUS NO ANAEROBES ISOLATED Performed at [facility], [address] [place], [place] [address]    Report Status 03/26/2020 FINAL  Final   Organism ID, Bacteria METHICILLIN RESISTANT STAPHYLOCOCCUS AUREUS  Final      Susceptibility   Methicillin resistant staphylococcus aureus - MIC*    CIPROFLOXACIN <=0.5 SENSITIVE Sensitive     ERYTHROMYCIN >=8 RESISTANT Resistant     GENTAMICIN <=0.5 SENSITIVE Sensitive     OXACILLIN >=4 RESISTANT Resistant     TETRACYCLINE <=1 SENSITIVE Sensitive     VANCOMYCIN 1 SENSITIVE Sensitive     TRIMETH/SULFA <=10 SENSITIVE Sensitive     CLINDAMYCIN <=0.25 SENSITIVE Sensitive     RIFAMPIN <=0.5 SENSITIVE Sensitive     Inducible Clindamycin NEGATIVE Sensitive     * RARE METHICILLIN RESISTANT STAPHYLOCOCCUS AUREUS  Acid Fast Smear (AFB)     Status: None   Collection Time: 03/21/20  9:21 AM   Specimen: PATH Other; Tissue  Result Value Ref Range Status   AFB Specimen Processing Concentration  Final   Acid Fast Smear Negative  Final    Comment: (NOTE) Performed At: [facility] [place] [address] [place], [place] [id number] Jolene SchimkeNagendra Sanjai MD MW:[id number]:[phone]    Source (AFB) TISSUE  Final    Comment: VEGETATION OF TRICUSPID VALVE SPEC A Performed at [facility], [address] [place], [place] [address]   Fungus Culture Result     Status: None   Collection Time: 03/21/20  9:21 AM  Result Value Ref Range Status   Result 1 Comment  Final    Comment: (NOTE) KOH/Calcofluor preparation:  no fungus observed. Performed At: [facility]  [address] [place], [place] [id number] Jolene SchimkeNagendra Sanjai MD VZ:[id number]:[phone]          Radiology Studies: No results found.      Scheduled Meds: . buprenorphine-naloxone  1 tablet Sublingual BID  . Chlorhexidine Gluconate Cloth  6 each Topical Daily  . docusate sodium  100 mg  Oral BID  . enoxaparin (LOVENOX) injection  40 mg Subcutaneous Q24H  . feeding supplement (ENSURE ENLIVE)  237 mL Oral TID BM  . ferrous sulfate  325 mg Oral TID WC  . multivitamin with minerals  1 tablet Oral Daily  . sodium chloride flush  10-40 mL Intracatheter Q12H   Continuous Infusions: . ceFEPime (MAXIPIME) IV 2 g (03/31/20 0519)  . vancomycin 1,250 mg (03/30/20 1948)          Glade Lloyd, MD [facility] 03/31/2020, 7:51 AM

## 2020-04-01 LAB — BASIC METABOLIC PANEL
Anion gap: 4 — ABNORMAL LOW (ref 5–15)
BUN: 20 mg/dL (ref 6–20)
CO2: 30 mmol/L (ref 22–32)
Calcium: 9.1 mg/dL (ref 8.9–10.3)
Chloride: 96 mmol/L — ABNORMAL LOW (ref 98–111)
Creatinine, Ser: 0.55 mg/dL — ABNORMAL LOW (ref 0.61–1.24)
GFR calc Af Amer: >60 mL/min (ref >60)
GFR calc non Af Amer: >60 mL/min (ref >60)
Glucose, Bld: 125 mg/dL — ABNORMAL HIGH (ref 70–99)
Potassium: 3.9 mmol/L (ref 3.5–5.1)
Sodium: 130 mmol/L — ABNORMAL LOW (ref 135–145)

## 2020-04-01 LAB — VANCOMYCIN, TROUGH: Vancomycin Tr: 14 ug/mL — ABNORMAL LOW (ref 15–20)

## 2020-04-01 MED ORDER — VANCOMYCIN HCL 1500 MG/300ML IV SOLN
1500.0000 mg | Freq: Two times a day (BID) | INTRAVENOUS | Status: DC
  Administered 2020-04-01 – 2020-04-11 (×20): 1500 mg via INTRAVENOUS
  Filled 2020-04-01 (×20): qty 300

## 2020-04-01 NOTE — Progress Notes (Signed)
Physical Therapy Treatment Patient Details Name: Robert Kramer MRN: [id number] DOB: 1975/10/26 Today's Date: 04/01/2020    History of Present Illness Pt is a 44 y.o. male admitted 03/11/20 for evaluation of abnormal blood cultures. Chest CTA shows multiple cavitary and noncavitary nodules consistent with septic emboli; bilateral PNA, bilateral hilar adenopathy. 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 Pt with L shoulder septic arthritis s/p I&D 9/10. S/p catheter-based debridement of tricuspid valve 9/16. PMH includes IVDA, bipolar disorder.  Underwent AngioVac procedure 9/16.    PT Comments    Pt agreeable to limited therapy, pt states he is more tired and in more pain today; pt tolerated [name] on L shoulder to slightly greater than 90 degrees; pt demonstrated increased difficulty with bed mobility following exercises due to fatigue and pain; pt continues to be limited in strength, endurance, coordination and balance and will benefit from skilled PT to address deficits to maximize independence with functional mobility prior to discharge.     Follow Up Recommendations  Home health PT;Supervision/Assistance - 24 hour     Equipment Recommendations  Rolling walker with 5" wheels    Recommendations for Other Services       Precautions / Restrictions Precautions Precautions: Fall Restrictions Weight Bearing Restrictions: No LUE Weight Bearing: Weight bearing as tolerated LLE Weight Bearing: Weight bearing as tolerated Other Position/Activity Restrictions: AROM as tolerated    Mobility  Bed Mobility               General bed mobility comments: pt performed supine<>sit x 2 trials, 1 trial mod I and second trial min A needed due to neck pain; second trial of bed mobility performed to reposition in bed; following repositioning pt stated pain decreased 8/10; education on positioning in the bed to relieve neck pain  Transfers                 General  transfer comment: performed 2 sets of 5 reps sit<>stand from EOB with min guard assist, pt demonstrating increased weakness requiring use of UE pressing against thighs to complete transfer; increased rest needed between sets, pt cued to not use RW unless needed, pt did not use RW during transfers   Ambulation/Gait                 Stairs             Wheelchair Mobility    Modified Rankin (Stroke Patients Only)       Balance                                            Cognition Arousal/Alertness: Awake/alert Behavior During Therapy: WFL for tasks assessed/performed Overall Cognitive Status: Within Functional Limits for tasks assessed                                 General Comments: pt requiring increased time during session due to an increase in pain      Exercises General Exercises - Upper Extremity Shoulder Flexion: [name];Left;10 reps;Seated Shoulder ABduction: [name];Left;10 reps;Seated    General Comments General comments (skin integrity, edema, etc.): HR increased to 106 during session      Pertinent Vitals/Pain Pain Assessment: 0-10 Pain Score: 10-Worst pain ever Pain Location: neck, L shoulder Pain Descriptors / Indicators: Aching;Grimacing  Home Living                      Prior Function            PT Goals (current goals can now be found in the care plan section) Acute Rehab PT Goals Patient Stated Goal: Continue to get my arm better. PT Goal Formulation: With patient Time For Goal Achievement: 04/15/20 Potential to Achieve Goals: Good Progress towards PT goals: Progressing toward goals    Frequency    Min 3X/week      PT Plan Current plan remains appropriate    Co-evaluation              AM-PAC PT "6 Clicks" Mobility   Outcome Measure  Help needed turning from your back to your side while in a flat bed without using bedrails?: None Help needed moving from lying on your back to  sitting on the side of a flat bed without using bedrails?: A Little Help needed moving to and from a bed to a chair (including a wheelchair)?: A Little Help needed standing up from a chair using your arms (e.g., wheelchair or bedside chair)?: A Little Help needed to walk in hospital room?: A Little Help needed climbing 3-5 steps with a railing? : A Little 6 Click Score: 19    End of Session Equipment Utilized During Treatment: Gait belt Activity Tolerance: Patient tolerated treatment well;Patient limited by fatigue;Patient limited by pain Patient left: in bed;with call bell/phone within reach Nurse Communication: Mobility status PT Visit Diagnosis: Difficulty in walking, not elsewhere classified (R26.2);Other abnormalities of gait and mobility (R26.89)     Time: 1224-4975 PT Time Calculation (min) (ACUTE ONLY): 13 min  Charges:  $Therapeutic Exercise: 8-22 mins                     Ginette Otto, DPT Acute Rehabilitation Services [phone]   Lucretia Field 04/01/2020, 12:22 PM

## 2020-04-01 NOTE — Progress Notes (Signed)
[facility] PROGRESS NOTE  DEMARCUS Kramer OEV:[id number] DOB: Oct 15, 1975 DOA: 03/11/2020 PCP: Patient, No Pcp Per  Status: Inpatient--Remains inpatient appropriate because:Unsafe d/c plan and IV treatments appropriate due to intensity of illness or inability to take PO   Dispo: The patient is from: Home              Anticipated d/c is to: Home              Anticipated d/c date is: > 3 days              Patient currently is not medically stable to d/c.  Needs to complete a full course of IV antibiotics to treat bacteremia and endocarditis.  Patient is an IV drug abuser and it is unsafe for patient to discharge outside of hospital with PICC line in place.   Code Status: Full Family Communication: Patient only DVT prophylaxis: Lovenox Vaccination status: ?  Has been given [employer] Covid vaccine prior to current admission; was last immunized against influenza in 2018 and did receive pneumococcal vaccine September 2021  HPI: 44 year old male with history of polysubstance use including IV drug use, bipolar disorder came to ED for evaluation of abnormal blood cultures. Patient was seen in [facility] on 03/06/2020 for evaluation management of heroin withdrawal.  He was treated supportively.  Blood cultures were obtained and grew MRSA and Serratia marcescens.  He was called from home to come to ED for further evaluation.  CTA chest PE showed multiple cavitary and noncavitary nodules consistent with septic emboli, bilateral lower lobe pneumonia and bilateral hilar adenopathy felt to be reactive.  No pulmonary embolus was reported.  Patient was started on IV ceftriaxone and vancomycin.  TTE was performed which showed shaggy vegetations on tricuspid valve.  TEE on 03/15/2020 showed 2 x 2 cm long vegetation on tricuspid valve with resultant severe TR.  MRI left shoulder showed septic arthropathy, myositis and tenosynovitis.  He underwent washout of left shoulder by orthopedic surgery on 03/15/2020.  He was  transferred to [facility] on 03/17/2020 for further evaluation of endocarditis by cardiothoracic surgery.  Subjective: Awakened.  Complaints verbalized.  Objective: Vitals:   03/31/20 1932 04/01/20 0445  BP: 122/78   Pulse: 98 95  Resp:    Temp: 98 F (36.7 C) 98 F (36.7 C)  SpO2: 99% 99%    Intake/Output Summary (Last 24 hours) at 04/01/2020 1255 Last data filed at 04/01/2020 0446 Gross per 24 hour  Intake 1040 ml  Output 2000 ml  Net -960 ml   Filed Weights   03/30/20 0533 03/31/20 0514 04/01/20 0445  Weight: 59.6 kg 60.3 kg 57.2 kg    Exam:  Constitutional: NAD, calm, appears to be comfortable Respiratory: clear to auscultation bilaterally, no wheezing, no crackles. Normal respiratory effort. No accessory muscle use. RA  Cardiovascular: Regular rate and rhythm, no murmurs / rubs / gallops. No extremity edema. 2+ pedal pulses. No carotid bruits.  Abdomen: no tenderness, no masses palpated. No hepatosplenomegaly. Bowel sounds positive.  Musculoskeletal: no clubbing / cyanosis. No joint deformity upper and lower extremities. Normal muscle tone.  Skin: no rashes, lesions, ulcers. No induration Neurologic: CN 2-12 grossly intact. Sensation intact, DTR normal. Strength 5/5 x all 4 extremities.  Psychiatric: Normal judgment and insight. Alert and oriented x 3. Normal mood.    Assessment/Plan: Severe sepsis secondary to MRSA and Serratia bacteremia-pulmonary septic emboli, TV endocarditis, left shoulder septic arthritis.   Patient is status post left shoulder arthroscopy  with debridement irrigation for septic arthritis as per orthopedics on 03/15/2020.  Echocardiogram showed tricuspid vegetation.  TEE showed 2x2 mobile vegetation with severe TR.   MRI lumbar spine showed no evidence of epidural abscess.  Patient refused MRI right shoulder ordered by ID.   CT surgery was consulted - s/p angiovac debridement on 03/21/2020.  Infectious disease following.  Continue IV vancomycin,  Rocephin was changed to cefepime per ID. Repeat blood cultures drawn on 9/8 negative.  ID recommended 4 more weeks of IV antibiotic therapy starting from 03/21/2020 with vancomycin and cefepime until 04/18/2020.  Rec to call ID back prior to discharge to reassess whether he requires outpatient regimen. Orthopedic team plans to remove Steri-Strips at the end of this week otherwise activity as tolerated  TV endocarditis-s/p Angiovac   CT surgery following.  Hyponatremia sodium is stable at 133 as of 9/23 Has been low since last month.   Serum osmolality is 281.  Substance use disorder Ongoing IV heroin use, last use was 3 days prior to admission.   Patient started on Suboxone 8/2 mg twice daily with as needed Suboxone per opiate withdrawal protocol.   Poke with Dr. Erlinda Hong with Suboxone clinic who agreed patient is an appropriate candidate.  He requested that patient be given number to contact [facility] arrange for outpatient follow-up; patient has completed this request. OUD intake assessment completed by telephone. Patient has been scheduled for 04/24/2020 at 9:45 with Dr. Nedra Hai.  HIV antibodies were nonreactive Hepatitis C antibodies were reactive. Hepatitis C genotype 1b.  Patient can follow-up with GI as outpatient for hepatitis C.  Thrombocytopenia Likely due to sepsis.   Platelet count has improved to 136,000  Anemia Hemoglobin has improved to 10.2.   Patient's hemoglobin dropped after surgery, and was transfused 1 unit PRBC.    Anemia panel obtained shows iron 25, saturation 13%, TIBC 195, B12 440, reticulocyte count percent 6.1, immature fraction 27.8.   9/22 have started 3 times daily iron replacement with twice daily scheduled Colace to prevent iron-induced constipation Haptoglobin is normal at 202    Data Reviewed: Basic Metabolic Panel: Recent Labs  Lab 03/26/20 0542 03/28/20 0500 04/01/20 0730  NA 129* 133* 130*  K 4.6 4.0 3.9  CL 98 98 96*   CO2 23 29 30   GLUCOSE 106* 99 125*  BUN 20 23* 20  CREATININE 0.60* 0.54* 0.55*  CALCIUM 9.1 9.1 9.1   Liver Function Tests: No results for input(s): AST, ALT, ALKPHOS, BILITOT, PROT, ALBUMIN in the last 168 hours. No results for input(s): LIPASE, AMYLASE in the last 168 hours. No results for input(s): AMMONIA in the last 168 hours. CBC: Recent Labs  Lab 03/26/20 0542  WBC 12.4*  HGB 10.2*  HCT 32.1*  MCV 90.4  PLT 165   Cardiac Enzymes: No results for input(s): CKTOTAL, CKMB, CKMBINDEX, TROPONINI in the last 168 hours. BNP (last 3 results) No results for input(s): BNP in the last 8760 hours.  ProBNP (last 3 results) No results for input(s): PROBNP in the last 8760 hours.  CBG: No results for input(s): GLUCAP in the last 168 hours.  No results found for this or any previous visit (from the past 240 hour(s)).   Studies: No results found.  Scheduled Meds: . buprenorphine-naloxone  1 tablet Sublingual BID  . Chlorhexidine Gluconate Cloth  6 each Topical Daily  . docusate sodium  100 mg Oral BID  . enoxaparin (LOVENOX) injection  40 mg Subcutaneous Q24H  .  feeding supplement (ENSURE ENLIVE)  237 mL Oral TID BM  . ferrous sulfate  325 mg Oral TID WC  . multivitamin with minerals  1 tablet Oral Daily  . sodium chloride flush  10-40 mL Intracatheter Q12H   Continuous Infusions: . ceFEPime (MAXIPIME) IV 2 g (04/01/20 0512)  . vancomycin      Principal Problem:   MRSA bacteremia Active Problems:   Polysubstance dependence (HCC)   Hypokalemia   Thrombocytopenia (HCC)   Serratia septicemia (HCC)   Bipolar 1 disorder (HCC)   Heroin abuse (HCC)   Arthritis, septic, shoulder (HCC)   Endocarditis of tricuspid valve   Hepatitis C virus infection   IVDU (intravenous drug user)   Normocytic anemia   Cigarette smoker   Septic pulmonary embolism (HCC)   Pressure injury of skin   Lumbar back pain   Consultants:  Orthopedics  CVTS  Infectious  disease  Procedures: 9/7 2D echocardiogram: 1. Left ventricular ejection fraction, by estimation, is 60 to 65%. The left ventricle has normal function. The left ventricle has no regional wall motion abnormalities. Left ventricular diastolic function could not be evaluated. 2. Right ventricular systolic function is normal. The right ventricular size is normal. There is normal pulmonary artery systolic pressure. The estimated right ventricular systolic pressure is 31.5 mmHg. 3. The mitral valve is normal in structure. No evidence of mitral valve regurgitation. No evidence of mitral stenosis. 4. There are several large shaggy mobile densities one of which measures 1.27 x 1.54cm on the TV worrisome for vegetations. The tricuspid valve is abnormal. 5. The aortic valve is normal in structure. Aortic valve regurgitation is not visualized. No aortic stenosis is present. 6. The inferior vena cava is normal in size with greater than 50% respiratory variability, suggesting right atrial pressure of 3 mmHg.  9/10 TEE: 1. Tricuspid valve endocarditis. 2. Left ventricular ejection fraction, by estimation, is 60 to 65%. The left ventricle has normal function. 3. Right ventricular systolic function is normal. The right ventricular size is normal. 4. No left atrial/left atrial appendage thrombus was detected. 5. Right atrial size was mildly dilated. 6. A small pericardial effusion is present. The pericardial effusion is circumferential. There is no evidence of cardiac tamponade. 7. The mitral valve is normal in structure. Trivial mitral valve regurgitation. 8. There are 2 separate elongated valvular vegetations along the medial and lateral tricuspid valve leaflets that are approximately 2 cm in length, residing mostly in the right atrial surface. There is associated severe tricuspid regurgitation.. The tricuspid valve is abnormal. Tricuspid valve regurgitation is severe. 9. The aortic valve is normal in structure.  Aortic valve regurgitation is not visualized. 10. There are very small shaggy thin mobile echodensities on the pulmonic valve surface. Trivial pulmonic valve regurgitation. This may represent pulmonic  9/10 left shoulder arthroscopy with extensive debridement, I/D, excisional debridement of the labrum and bursa as well as left shoulder subacromial bursectomy  9/16 application of angio VAC with intraoperative TEE: Right heart cannulation - Debridement of right atrial mass - Debridement of tricuspid valve vegetation  Antibiotics: Anti-infectives (From admission, onward)   Start     Dose/Rate Route Frequency Ordered Stop   04/01/20 2000  vancomycin (VANCOREADY) IVPB 1500 mg/300 mL        1,500 mg 150 mL/hr over 120 Minutes Intravenous Every 12 hours 04/01/20 1140 04/23/20 0759   03/22/20 2000  vancomycin (VANCOREADY) IVPB 1250 mg/250 mL  Status:  Discontinued        1,250 mg 166.7  mL/hr over 90 Minutes Intravenous Every 12 hours 03/22/20 1435 04/01/20 1140   03/20/20 1400  ceFEPIme (MAXIPIME) 2 g in sodium chloride 0.9 % 100 mL IVPB        2 g 200 mL/hr over 30 Minutes Intravenous Every 8 hours 03/20/20 0912 04/22/20 2359   03/19/20 2200  vancomycin (VANCOCIN) IVPB 1000 mg/200 mL premix  Status:  Discontinued        1,000 mg 200 mL/hr over 60 Minutes Intravenous Every 12 hours 03/19/20 1258 03/22/20 1435   03/18/20 1400  cefTRIAXone (ROCEPHIN) 2 g in sodium chloride 0.9 % 100 mL IVPB  Status:  Discontinued        2 g 200 mL/hr over 30 Minutes Intravenous Every 24 hours 03/18/20 1105 03/20/20 0909   03/12/20 2000  cefTRIAXone (ROCEPHIN) 2 g in sodium chloride 0.9 % 100 mL IVPB  Status:  Discontinued        2 g 200 mL/hr over 30 Minutes Intravenous Every 24 hours 03/11/20 2058 03/12/20 0937   03/12/20 1400  ceFEPIme (MAXIPIME) 2 g in sodium chloride 0.9 % 100 mL IVPB  Status:  Discontinued        2 g 200 mL/hr over 30 Minutes Intravenous Every 8 hours 03/12/20 0937 03/18/20 1105    03/12/20 0000  vancomycin (VANCOCIN) IVPB 1000 mg/200 mL premix  Status:  Discontinued        1,000 mg 200 mL/hr over 60 Minutes Intravenous Every 8 hours 03/11/20 1816 03/19/20 1258   03/11/20 1830  cefTRIAXone (ROCEPHIN) 2 g in sodium chloride 0.9 % 100 mL IVPB        2 g 200 mL/hr over 30 Minutes Intravenous  Once 03/11/20 1822 03/11/20 2030   03/11/20 1545  vancomycin (VANCOREADY) IVPB 1500 mg/300 mL        1,500 mg 150 mL/hr over 120 Minutes Intravenous  Once 03/11/20 1532 03/11/20 1819       Time spent: 15 minutes    Junious Silkllison Fareeha Evon ANP  [facility] Pager [phone]. If 7PM-7AM, please contact night-coverage at [email or web address] 04/01/2020, 12:55 PM  LOS: 21 days

## 2020-04-01 NOTE — Progress Notes (Signed)
Pharmacy Antibiotic Note  Robert Kramer is a 44 y.o. 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 Angiovac procedure completed 9/16. Additionally, patient has L shoulder septic arthritis s/p I&D 9/10 with culture growing rare MRSA. Pharmacy has been consulted for cefepime dosing for Serratia bacteremia/endocarditis (given ampC organism). -vancomycin trough= 14, SCr ~ 0.5   Plan: Continue cefepime 2g q8 hours Change vancomycin to 1500mg  IV q12h Consider a vancomycin level later this week   Height: 5\' 11"  (180.3 cm) Weight: 57.2 kg (126 lb 1.7 oz) IBW/kg (Calculated) : 75.3  Temp (24hrs), Avg:98.3 F (36.8 C), Min:98 F (36.7 C), Max:98.9 F (37.2 C)  Recent Labs  Lab 03/26/20 0542 03/26/20 0654 03/28/20 0500 04/01/20 0730  WBC 12.4*  --   --   --   CREATININE 0.60*  --  0.54* 0.55*  VANCOTROUGH  --  20  --  14*    Estimated Creatinine Clearance: 93.3 mL/min (A) (by C-G formula based on SCr of 0.55 mg/dL (L)).    Allergies  Allergen Reactions  . Tramadol Other (See Comments)    Upset stomach   Antimicrobials this admission: Vancomycin 9/6 >>  Ceftriaxone 9/6 >>9/7, 9/13>>9/15 Cefepime 9/7 >>9/13, 9/15>>   Microbiology results: 9/1 BCx x1: MRSA 9/1 BCx x1: Serratia Marcescens (S= Bactrim, Cipro; R=Cefazolin) 9/6 UCx: mult sp F 9/6 BCx: MRSA F 9/6 COVID: neg 9/8 Hep A reactive 9/8 Hep B NR 9/8 Hep C reactive 9/8 BCx2 ngtd 9/10 MRSA PCR neg  9/10 L shoulder synovial fluid: rare MRSA 9/16 tissue/veg: MRSA  Thank you for allowing pharmacy to be a part of this patient's care.  11/8, PharmD Clinical Pharmacist **Pharmacist phone directory can now be found on [email or web address] ([email or web address]).  Listed under [facility].

## 2020-04-02 NOTE — Progress Notes (Signed)
Physical Therapy Treatment Patient Details Name: Robert Kramer MRN: [id number] DOB: 1976-01-16 Today's Date: 04/02/2020    History of Present Illness Pt is a 44 y.o. male admitted 03/11/20 for evaluation of abnormal blood cultures. Chest CTA shows multiple cavitary and noncavitary nodules consistent with septic emboli; bilateral PNA, bilateral hilar adenopathy. 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 Pt with L shoulder septic arthritis s/p I&D 9/10. S/p catheter-based debridement of tricuspid valve 9/16. PMH includes IVDA, bipolar disorder.  Underwent AngioVac procedure 9/16.    PT Comments    Pt requesting bed level exercises due to a busy morning; pt demonstrating improved tolerance for exercises and [name] for L shoulder; pt continues to demonstrate overall weakness and poor endurance and balance; pt will benefit from skilled PT to address deficits and maximize independence with functional mobility prior to discharge.     Follow Up Recommendations  Home health PT;Supervision/Assistance - 24 hour     Equipment Recommendations  Rolling walker with 5" wheels    Recommendations for Other Services       Precautions / Restrictions Restrictions Weight Bearing Restrictions: Yes LUE Weight Bearing: Weight bearing as tolerated LLE Weight Bearing: Weight bearing as tolerated Other Position/Activity Restrictions: AROM as tolerated    Mobility  Bed Mobility                  Transfers                    Ambulation/Gait                 Stairs             Wheelchair Mobility    Modified Rankin (Stroke Patients Only)       Balance                                            Cognition Arousal/Alertness: Awake/alert Behavior During Therapy: WFL for tasks assessed/performed                                   General Comments: pt requesting bed exercises staing he has been through a lot this  morning and hasn't eaten      Exercises General Exercises - Upper Extremity Shoulder Flexion: [name];Left;10 reps;Supine Shoulder ABduction: [name];Left;10 reps;Supine General Exercises - Lower Extremity Ankle Circles/Pumps: [name];Supine;Both;20 reps Quad Sets: [name];Both;20 reps;Supine Gluteal Sets: AROM;Both;20 reps;Supine Heel Slides: AROM;Both;20 reps;Supine Hip ABduction/ADduction: AROM;Both;20 reps;Supine Straight Leg Raises: AROM;Both;20 reps;Supine    General Comments        Pertinent Vitals/Pain Pain Assessment: Faces Faces Pain Scale: Hurts a little bit Pain Location: neck, L shoulder    Home Living                      Prior Function            PT Goals (current goals can now be found in the care plan section) Acute Rehab PT Goals Patient Stated Goal: Continue to get my arm better. PT Goal Formulation: With patient Time For Goal Achievement: 04/15/20 Potential to Achieve Goals: Good Progress towards PT goals: Progressing toward goals    Frequency    Min 3X/week      PT Plan Current plan remains appropriate    Co-evaluation  AM-PAC PT "6 Clicks" Mobility   Outcome Measure  Help needed turning from your back to your side while in a flat bed without using bedrails?: None Help needed moving from lying on your back to sitting on the side of a flat bed without using bedrails?: A Little Help needed moving to and from a bed to a chair (including a wheelchair)?: A Little Help needed standing up from a chair using your arms (e.g., wheelchair or bedside chair)?: A Little Help needed to walk in hospital room?: A Little Help needed climbing 3-5 steps with a railing? : A Little 6 Click Score: 19    End of Session   Activity Tolerance: Patient tolerated treatment well;Patient limited by fatigue;Patient limited by pain Patient left: in bed;with call bell/phone within reach Nurse Communication: Mobility status PT Visit Diagnosis:  Difficulty in walking, not elsewhere classified (R26.2);Other abnormalities of gait and mobility (R26.89)     Time: 3875-6433 PT Time Calculation (min) (ACUTE ONLY): 14 min  Charges:  $Therapeutic Exercise: 8-22 mins                     Ginette Otto, DPT Acute Rehabilitation Services [phone]   Lucretia Field 04/02/2020, 1:09 PM

## 2020-04-02 NOTE — Progress Notes (Signed)
Nutrition Follow-up  RD working remotely.  DOCUMENTATION CODES:   Not applicable  INTERVENTION:   -Continue Ensure Enlive po TID, each supplement provides 350 kcal and 20 grams of protein -Continue MVI with minerals daily -Magic cup TID with meals, each supplement provides 290 kcal and 9 grams of protein -Continue to encourage intake of meals and supplements  NUTRITION DIAGNOSIS:   Increased nutrient needs related to acute illness (sepsis) as evidenced by estimated needs.  Ongoing  GOAL:   Patient will meet greater than or equal to 90% of their needs  Progressing   MONITOR:   PO intake, Supplement acceptance, Labs, Weight trends, I & O's  REASON FOR ASSESSMENT:   Consult Assessment of nutrition requirement/status  ASSESSMENT:   44 y.o. male with medical history significant for polysubstance use disorder including injection drug use and bipolar disorder who presents to the ED for evaluation of abnormal blood cultures.  9/16- s/p angio vac procedure with cardiothoracic surgery  Reviewed I/O's: -1.7 L x 24 hours and -10.6 L since 03/19/20  UOP:1.7 L x 24 hours   Attempted to speak with pt via call to hospital room phone, however, unable to reach.   Pt remains with improved oral intake; noted meal completions 50-100% per doc flowsheets. He is consuming Ensure Enlive supplements per MAR.   Reviewed wt hx; wt has ranged between 126-134# over the past week. Noted pt has experienced a 5.1% wt loss over the past week, which is significant for time frame.   Medications reviewed and include colace and ferrous sulfate.   Plan to remain inpatient until completion of IV antibiotics. Pt has appointment with suboxone clinic at discharge.   Labs reviewed: Na: 133.   Diet Order:   Diet Order            Diet regular Room service appropriate? Yes; Fluid consistency: Thin  Diet effective now                 EDUCATION NEEDS:   No education needs have been identified at  this time  Skin:  Skin Assessment: Skin Integrity Issues: Skin Integrity Issues:: Stage II Stage II: coccyx Incisions: closed incisions to L shoulder, R chest, R groin  Last BM:  04/02/20  Height:   Ht Readings from Last 1 Encounters:  03/11/20 5\' 11"  (1.803 m)    Weight:   Wt Readings from Last 1 Encounters:  04/01/20 57.2 kg   BMI:  Body mass index is 17.59 kg/m.  Estimated Nutritional Needs:   Kcal:  1800-2000  Protein:  100-115 grams  Fluid:  > 1.8 L    04/03/20, RD, LDN, CDCES Registered Dietitian II Certified Diabetes Care and Education Specialist Please refer to [facility] for RD and/or RD on-call/weekend/after hours pager

## 2020-04-02 NOTE — Progress Notes (Signed)
[facility] PROGRESS NOTE  Robert Kramer ZOX:[id number] DOB: [birth year] DOA: 03/11/2020 PCP: Patient, No Pcp Per  Status: Inpatient--Remains inpatient appropriate because:Unsafe d/c plan and IV treatments appropriate due to intensity of illness or inability to take PO   Dispo: The patient is from: Home              Anticipated d/c is to: Home              Anticipated d/c date is: > 3 days              Patient currently is not medically stable to d/c.  Needs to complete a full course of IV antibiotics to treat bacteremia and endocarditis.  Patient is an IV drug abuser and it is unsafe for patient to discharge outside of hospital with PICC line in place.  9/28 face-to-face completed regarding home health RN, PT and social work   Code Status: Full Family Communication: Patient only DVT prophylaxis: Lovenox Vaccination status: ?  Has been given Laural BenesJohnson & Laural BenesJohnson Covid vaccine prior to current admission; was last immunized against influenza in 2018 and did receive pneumococcal vaccine September 2021  HPI: 44 year old male with history of polysubstance use including IV drug use, bipolar disorder came to ED for evaluation of abnormal blood cultures. Patient was seen in [facility] on 03/06/2020 for evaluation management of heroin withdrawal.  He was treated supportively.  Blood cultures were obtained and grew MRSA and Serratia marcescens.  He was called from home to come to ED for further evaluation.  CTA chest PE showed multiple cavitary and noncavitary nodules consistent with septic emboli, bilateral lower lobe pneumonia and bilateral hilar adenopathy felt to be reactive.  No pulmonary embolus was reported.  Patient was started on IV ceftriaxone and vancomycin.  TTE was performed which showed shaggy vegetations on tricuspid valve.  TEE on 03/15/2020 showed 2 x 2 cm long vegetation on tricuspid valve with resultant severe TR.  MRI left shoulder showed septic arthropathy, myositis and tenosynovitis.  He  underwent washout of left shoulder by orthopedic surgery on 03/15/2020.  [facility]17/2020 for further evaluation of endocarditis by cardiothoracic surgery.  Subjective: Awakened.  Continues to report pain and shortness of breath with ambulation but no reports of shortness of breath or discomfort while seated in the bed.  Objective: Vitals:   04/02/20 0428 04/02/20 0700  BP: 111/75 106/76  Pulse: 94 87  Resp: 20 15  Temp: 99 F (37.2 C) 98.3 F (36.8 C)  SpO2: 97% 98%    Intake/Output Summary (Last 24 hours) at 04/02/2020 1206 Last data filed at 04/02/2020 1000 Gross per 24 hour  Intake 222 ml  Output 2200 ml  Net -1978 ml   Filed Weights   03/30/20 0533 03/31/20 0514 04/01/20 0445  Weight: 59.6 kg 60.3 kg 57.2 kg    Exam:  Constitutional: NAD, calm, appears to be comfortable while seated in the bed Respiratory: clear to auscultation bilaterally, no wheezing, no crackles. Normal respiratory effort. No accessory muscle use. RA  Cardiovascular: Regular rate and rhythm, no murmurs / rubs / gallops. No extremity edema. 2+ pedal pulses. No carotid bruits.  Abdomen: no tenderness, no masses palpated. No hepatosplenomegaly. Bowel sounds positive.  Musculoskeletal: no clubbing / cyanosis. No joint deformity upper and lower extremities. Normal muscle tone.  Skin: no rashes, lesions, ulcers. No induration Neurologic: CN 2-12 grossly intact. Sensation intact, DTR normal. Strength 5/5 x all 4 extremities.  Psychiatric: Normal  judgment and insight. Alert and oriented x 3. Normal mood.    Assessment/Plan: Severe sepsis secondary to MRSA and Serratia bacteremia-pulmonary septic emboli, TV endocarditis, left shoulder septic arthritis.   Patient is status post left shoulder arthroscopy with debridement irrigation for septic arthritis as per orthopedics on 03/15/2020.  Echocardiogram showed tricuspid vegetation.  TEE showed 2x2 mobile vegetation with severe TR.   MRI  lumbar spine showed no evidence of epidural abscess.  Patient refused MRI right shoulder ordered by ID.   CT surgery was consulted - s/p angiovac debridement on 03/21/2020.  Infectious disease following.  Continue IV vancomycin, Rocephin was changed to cefepime per ID. Repeat blood cultures drawn on 9/8 negative.  ID recommended 4 more weeks of IV antibiotic therapy starting from 03/21/2020 with vancomycin and cefepime until 04/18/2020.  Rec to call ID back prior to discharge to reassess whether he requires outpatient regimen.  TV endocarditis-s/p Angiovac   CT surgery following.  Hyponatremia sodium is stable at 133 as of 9/23 Has been low since last month.   Serum osmolality is 281.  Substance use disorder Ongoing IV heroin use, last use was 3 days prior to admission.   Patient started on Suboxone 8/2 mg twice daily with as needed Suboxone per opiate withdrawal protocol.   Poke with Dr. Erlinda Hong with Suboxone clinic who agreed patient is an appropriate candidate.  He requested that patient be given number to contact [facility] clinic arrange for outpatient follow-up; patient has completed this request. OUD intake assessment completed by telephone. Patient has been scheduled for 04/24/2020 at 9:45 with Dr. Nedra Hai.  HIV antibodies were nonreactive Hepatitis C antibodies were reactive. Hepatitis C genotype 1b.  Patient can follow-up with GI as outpatient for hepatitis C.  Thrombocytopenia Likely due to sepsis.   Platelet count has improved to 136,000  Anemia Hemoglobin  stable at 10.2.   Patient's hemoglobin dropped after surgery, and was transfused 1 unit PRBC.    Anemia panel obtained shows iron 25, saturation 13%, TIBC 195, B12 440, reticulocyte count percent 6.1, immature fraction 27.8.   9/22 started 3 times daily iron replacement with twice daily scheduled Colace to prevent iron-induced constipation Haptoglobin is normal at 202    Data Reviewed: Basic Metabolic  Panel: Recent Labs  Lab 03/28/20 0500 04/01/20 0730  NA 133* 130*  K 4.0 3.9  CL 98 96*  CO2 29 30  GLUCOSE 99 125*  BUN 23* 20  CREATININE 0.54* 0.55*  CALCIUM 9.1 9.1   Liver Function Tests: No results for input(s): AST, ALT, ALKPHOS, BILITOT, PROT, ALBUMIN in the last 168 hours. No results for input(s): LIPASE, AMYLASE in the last 168 hours. No results for input(s): AMMONIA in the last 168 hours. CBC: No results for input(s): WBC, NEUTROABS, HGB, HCT, MCV, PLT in the last 168 hours. Cardiac Enzymes: No results for input(s): CKTOTAL, CKMB, CKMBINDEX, TROPONINI in the last 168 hours. BNP (last 3 results) No results for input(s): BNP in the last 8760 hours.  ProBNP (last 3 results) No results for input(s): PROBNP in the last 8760 hours.  CBG: No results for input(s): GLUCAP in the last 168 hours.  No results found for this or any previous visit (from the past 240 hour(s)).   Studies: No results found.  Scheduled Meds: . buprenorphine-naloxone  1 tablet Sublingual BID  . Chlorhexidine Gluconate Cloth  6 each Topical Daily  . docusate sodium  100 mg Oral BID  . enoxaparin (LOVENOX) injection  40 mg  Subcutaneous Q24H  . feeding supplement (ENSURE ENLIVE)  237 mL Oral TID BM  . ferrous sulfate  325 mg Oral TID WC  . multivitamin with minerals  1 tablet Oral Daily  . sodium chloride flush  10-40 mL Intracatheter Q12H   Continuous Infusions: . ceFEPime (MAXIPIME) IV 2 g (04/02/20 0539)  . vancomycin 1,500 mg (04/02/20 0820)    Principal Problem:   MRSA bacteremia Active Problems:   Polysubstance dependence (HCC)   Hypokalemia   Thrombocytopenia (HCC)   Serratia septicemia (HCC)   Bipolar 1 disorder (HCC)   Heroin abuse (HCC)   Arthritis, septic, shoulder (HCC)   Endocarditis of tricuspid valve   Hepatitis C virus infection   IVDU (intravenous drug user)   Normocytic anemia   Cigarette smoker   Septic pulmonary embolism (HCC)   Pressure injury of skin    Lumbar back pain   Consultants:  Orthopedics  CVTS  Infectious disease  Procedures: 9/7 2D echocardiogram: 1. Left ventricular ejection fraction, by estimation, is 60 to 65%. The left ventricle has normal function. The left ventricle has no regional wall motion abnormalities. Left ventricular diastolic function could not be evaluated. 2. Right ventricular systolic function is normal. The right ventricular size is normal. There is normal pulmonary artery systolic pressure. The estimated right ventricular systolic pressure is 31.5 mmHg. 3. The mitral valve is normal in structure. No evidence of mitral valve regurgitation. No evidence of mitral stenosis. 4. There are several large shaggy mobile densities one of which measures 1.27 x 1.54cm on the TV worrisome for vegetations. The tricuspid valve is abnormal. 5. The aortic valve is normal in structure. Aortic valve regurgitation is not visualized. No aortic stenosis is present. 6. The inferior vena cava is normal in size with greater than 50% respiratory variability, suggesting right atrial pressure of 3 mmHg.  9/10 TEE: 1. Tricuspid valve endocarditis. 2. Left ventricular ejection fraction, by estimation, is 60 to 65%. The left ventricle has normal function. 3. Right ventricular systolic function is normal. The right ventricular size is normal. 4. No left atrial/left atrial appendage thrombus was detected. 5. Right atrial size was mildly dilated. 6. A small pericardial effusion is present. The pericardial effusion is circumferential. There is no evidence of cardiac tamponade. 7. The mitral valve is normal in structure. Trivial mitral valve regurgitation. 8. There are 2 separate elongated valvular vegetations along the medial and lateral tricuspid valve leaflets that are approximately 2 cm in length, residing mostly in the right atrial surface. There is associated severe tricuspid regurgitation.. The tricuspid valve is abnormal. Tricuspid  valve regurgitation is severe. 9. The aortic valve is normal in structure. Aortic valve regurgitation is not visualized. 10. There are very small shaggy thin mobile echodensities on the pulmonic valve surface. Trivial pulmonic valve regurgitation. This may represent pulmonic  9/10 left shoulder arthroscopy with extensive debridement, I/D, excisional debridement of the labrum and bursa as well as left shoulder subacromial bursectomy  9/16 application of angio VAC with intraoperative TEE: Right heart cannulation - Debridement of right atrial mass - Debridement of tricuspid valve vegetation  Antibiotics: Anti-infectives (From admission, onward)   Start     Dose/Rate Route Frequency Ordered Stop   04/01/20 2000  vancomycin (VANCOREADY) IVPB 1500 mg/300 mL        1,500 mg 150 mL/hr over 120 Minutes Intravenous Every 12 hours 04/01/20 1140 04/23/20 0759   03/22/20 2000  vancomycin (VANCOREADY) IVPB 1250 mg/250 mL  Status:  Discontinued  1,250 mg 166.7 mL/hr over 90 Minutes Intravenous Every 12 hours 03/22/20 1435 04/01/20 1140   03/20/20 1400  ceFEPIme (MAXIPIME) 2 g in sodium chloride 0.9 % 100 mL IVPB        2 g 200 mL/hr over 30 Minutes Intravenous Every 8 hours 03/20/20 0912 04/22/20 2359   03/19/20 2200  vancomycin (VANCOCIN) IVPB 1000 mg/200 mL premix  Status:  Discontinued        1,000 mg 200 mL/hr over 60 Minutes Intravenous Every 12 hours 03/19/20 1258 03/22/20 1435   03/18/20 1400  cefTRIAXone (ROCEPHIN) 2 g in sodium chloride 0.9 % 100 mL IVPB  Status:  Discontinued        2 g 200 mL/hr over 30 Minutes Intravenous Every 24 hours 03/18/20 1105 03/20/20 0909   03/12/20 2000  cefTRIAXone (ROCEPHIN) 2 g in sodium chloride 0.9 % 100 mL IVPB  Status:  Discontinued        2 g 200 mL/hr over 30 Minutes Intravenous Every 24 hours 03/11/20 2058 03/12/20 0937   03/12/20 1400  ceFEPIme (MAXIPIME) 2 g in sodium chloride 0.9 % 100 mL IVPB  Status:  Discontinued        2 g 200 mL/hr  over 30 Minutes Intravenous Every 8 hours 03/12/20 0937 03/18/20 1105   03/12/20 0000  vancomycin (VANCOCIN) IVPB 1000 mg/200 mL premix  Status:  Discontinued        1,000 mg 200 mL/hr over 60 Minutes Intravenous Every 8 hours 03/11/20 1816 03/19/20 1258   03/11/20 1830  cefTRIAXone (ROCEPHIN) 2 g in sodium chloride 0.9 % 100 mL IVPB        2 g 200 mL/hr over 30 Minutes Intravenous  Once 03/11/20 1822 03/11/20 2030   03/11/20 1545  vancomycin (VANCOREADY) IVPB 1500 mg/300 mL        1,500 mg 150 mL/hr over 120 Minutes Intravenous  Once 03/11/20 1532 03/11/20 1819       Time spent: 15 minutes    Junious Silk ANP  [facility] Pager [phone]. If 7PM-7AM, please contact night-coverage at [email or web address] 04/02/2020, 12:06 PM  LOS: 22 days

## 2020-04-03 NOTE — Progress Notes (Signed)
[facility] PROGRESS NOTE  Robert Kramer TOI:[id number] DOB: [birth year] DOA: 03/11/2020 PCP: Patient, No Pcp Per  Status: Inpatient--Remains inpatient appropriate because:Unsafe d/c plan and IV treatments appropriate due to intensity of illness or inability to take PO   Dispo: The patient is from: Home              Anticipated d/c is to: Home              Anticipated d/c date is: > 3 days              Patient currently is not medically stable to d/c.  Needs to complete a full course of IV antibiotics to treat bacteremia and endocarditis.  Patient is an IV drug abuser and it is unsafe for patient to discharge outside of hospital with PICC line in place.  9/28 face-to-face completed regarding home health RN, PT and social work   Code Status: Full Family Communication: Patient only DVT prophylaxis: Lovenox Vaccination status: ?  Has been given [employer] Covid vaccine prior to current admission; was last immunized against influenza in 2018 and did receive pneumococcal vaccine September 2021  HPI: 44 year old male with history of polysubstance use including IV drug use, bipolar disorder came to ED for evaluation of abnormal blood cultures. Patient was seen in [facility] on 03/06/2020 for evaluation management of heroin withdrawal.  He was treated supportively.  Blood cultures were obtained and grew MRSA and Serratia marcescens.  He was called from home to come to ED for further evaluation.  CTA chest PE showed multiple cavitary and noncavitary nodules consistent with septic emboli, bilateral lower lobe pneumonia and bilateral hilar adenopathy felt to be reactive.  No pulmonary embolus was reported.  Patient was started on IV ceftriaxone and vancomycin.  TTE was performed which showed shaggy vegetations on tricuspid valve.  TEE on 03/15/2020 showed 2 x 2 cm long vegetation on tricuspid valve with resultant severe TR.  MRI left shoulder showed septic arthropathy, myositis and tenosynovitis.  He  underwent washout of left shoulder by orthopedic surgery on 03/15/2020.  He was transferred to [facility] on 03/17/2020 for further evaluation of endocarditis by cardiothoracic surgery.  Subjective: Awake.  No complaints verbalized.  Patient would like to be able to leave the room if possible.  Discussed this could be arranged but he would not be able to smoke and he would have to be accompanied by staff.  Objective: Vitals:   04/03/20 0643 04/03/20 1425  BP: 107/75 107/69  Pulse: 81 85  Resp: 12 18  Temp: 98.5 F (36.9 C) 98.8 F (37.1 C)  SpO2:  99%    Intake/Output Summary (Last 24 hours) at 04/03/2020 1432 Last data filed at 04/03/2020 0645 Gross per 24 hour  Intake 1915.93 ml  Output 2200 ml  Net -284.07 ml   Filed Weights   03/30/20 0533 03/31/20 0514 04/01/20 0445  Weight: 59.6 kg 60.3 kg 57.2 kg    Exam:  Constitutional: NAD, calm, comfortable Respiratory: clear to auscultation bilaterally, no wheezing, no crackles. Normal respiratory effort. No accessory muscle use. RA  Cardiovascular: Regular rate and rhythm, no murmurs / rubs / gallops. No extremity edema. 2+ pedal pulses. No carotid bruits.  Abdomen: no tenderness, no masses palpated. No hepatosplenomegaly. Bowel sounds positive.  Musculoskeletal: no clubbing / cyanosis. No joint deformity upper and lower extremities. Normal muscle tone.  Skin: no rashes, lesions, ulcers. No induration Neurologic: CN 2-12 grossly intact. Sensation intact, DTR normal. Strength 5/5  x all 4 extremities.  Psychiatric: Normal judgment and insight. Alert and oriented x 3. Normal mood.    Assessment/Plan: Severe sepsis secondary to MRSA and Serratia bacteremia-pulmonary septic emboli, TV endocarditis, left shoulder septic arthritis.   Patient is status post left shoulder arthroscopy with debridement irrigation for septic arthritis as per orthopedics on 03/15/2020.  Echocardiogram showed tricuspid vegetation.  TEE showed 2x2 mobile  vegetation with severe TR.   MRI lumbar spine showed no evidence of epidural abscess.  Patient refused MRI right shoulder ordered by ID.   CT surgery was consulted - s/p angiovac debridement on 03/21/2020.  Infectious disease following.  Continue IV vancomycin, Rocephin was changed to cefepime per ID. Repeat blood cultures drawn on 9/8 negative.  ID recommended 4 more weeks of IV antibiotic therapy starting from 03/21/2020 with vancomycin and cefepime until 04/18/2020.  Rec to call ID back prior to discharge to reassess whether he requires outpatient regimen.  TV endocarditis-s/p Angiovac   CT surgery following.  Hyponatremia sodium is stable at 133 as of 9/23 Has been low since last month.   Serum osmolality is 281.  Substance use disorder Ongoing IV heroin use, last use was 3 days prior to admission.   Patient started on Suboxone 8/2 mg twice daily with as needed Suboxone per opiate withdrawal protocol.   Poke with Dr. Erlinda Hong with Suboxone clinic who agreed patient is an appropriate candidate.  He requested that patient be given number to contact [facility] arrange for outpatient follow-up; patient has completed this request. OUD intake assessment completed by telephone. Patient has been scheduled for 04/24/2020 at 9:45 with Dr. Nedra Hai.  HIV antibodies were nonreactive Hepatitis C antibodies were reactive. Hepatitis C genotype 1b.  Patient can follow-up with GI as outpatient for hepatitis C.  Thrombocytopenia Likely due to sepsis.   Platelet count has improved to 136,000  Anemia Hemoglobin  stable at 10.2.   Patient's hemoglobin dropped after surgery, and was transfused 1 unit PRBC.    Anemia panel obtained shows iron 25, saturation 13%, TIBC 195, B12 440, reticulocyte count percent 6.1, immature fraction 27.8.   9/22 started 3 times daily iron replacement with twice daily scheduled Colace to prevent iron-induced constipation Haptoglobin is normal at 202    Data  Reviewed: Basic Metabolic Panel: Recent Labs  Lab 03/28/20 0500 04/01/20 0730  NA 133* 130*  K 4.0 3.9  CL 98 96*  CO2 29 30  GLUCOSE 99 125*  BUN 23* 20  CREATININE 0.54* 0.55*  CALCIUM 9.1 9.1   Liver Function Tests: No results for input(s): AST, ALT, ALKPHOS, BILITOT, PROT, ALBUMIN in the last 168 hours. No results for input(s): LIPASE, AMYLASE in the last 168 hours. No results for input(s): AMMONIA in the last 168 hours. CBC: No results for input(s): WBC, NEUTROABS, HGB, HCT, MCV, PLT in the last 168 hours. Cardiac Enzymes: No results for input(s): CKTOTAL, CKMB, CKMBINDEX, TROPONINI in the last 168 hours. BNP (last 3 results) No results for input(s): BNP in the last 8760 hours.  ProBNP (last 3 results) No results for input(s): PROBNP in the last 8760 hours.  CBG: No results for input(s): GLUCAP in the last 168 hours.  No results found for this or any previous visit (from the past 240 hour(s)).   Studies: No results found.  Scheduled Meds: . buprenorphine-naloxone  1 tablet Sublingual BID  . Chlorhexidine Gluconate Cloth  6 each Topical Daily  . docusate sodium  100 mg Oral BID  .  enoxaparin (LOVENOX) injection  40 mg Subcutaneous Q24H  . feeding supplement (ENSURE ENLIVE)  237 mL Oral TID BM  . ferrous sulfate  325 mg Oral TID WC  . multivitamin with minerals  1 tablet Oral Daily  . sodium chloride flush  10-40 mL Intracatheter Q12H   Continuous Infusions: . ceFEPime (MAXIPIME) IV 2 g (04/03/20 0656)  . vancomycin 1,500 mg (04/03/20 0854)    Principal Problem:   MRSA bacteremia Active Problems:   Polysubstance dependence (HCC)   Hypokalemia   Thrombocytopenia (HCC)   Serratia septicemia (HCC)   Bipolar 1 disorder (HCC)   Heroin abuse (HCC)   Arthritis, septic, shoulder (HCC)   Endocarditis of tricuspid valve   Hepatitis C virus infection   IVDU (intravenous drug user)   Normocytic anemia   Cigarette smoker   Septic pulmonary embolism (HCC)    Pressure injury of skin   Lumbar back pain   Consultants:  Orthopedics  CVTS  Infectious disease  Procedures: 9/7 2D echocardiogram: 1. Left ventricular ejection fraction, by estimation, is 60 to 65%. The left ventricle has normal function. The left ventricle has no regional wall motion abnormalities. Left ventricular diastolic function could not be evaluated. 2. Right ventricular systolic function is normal. The right ventricular size is normal. There is normal pulmonary artery systolic pressure. The estimated right ventricular systolic pressure is 31.5 mmHg. 3. The mitral valve is normal in structure. No evidence of mitral valve regurgitation. No evidence of mitral stenosis. 4. There are several large shaggy mobile densities one of which measures 1.27 x 1.54cm on the TV worrisome for vegetations. The tricuspid valve is abnormal. 5. The aortic valve is normal in structure. Aortic valve regurgitation is not visualized. No aortic stenosis is present. 6. The inferior vena cava is normal in size with greater than 50% respiratory variability, suggesting right atrial pressure of 3 mmHg.  9/10 TEE: 1. Tricuspid valve endocarditis. 2. Left ventricular ejection fraction, by estimation, is 60 to 65%. The left ventricle has normal function. 3. Right ventricular systolic function is normal. The right ventricular size is normal. 4. No left atrial/left atrial appendage thrombus was detected. 5. Right atrial size was mildly dilated. 6. A small pericardial effusion is present. The pericardial effusion is circumferential. There is no evidence of cardiac tamponade. 7. The mitral valve is normal in structure. Trivial mitral valve regurgitation. 8. There are 2 separate elongated valvular vegetations along the medial and lateral tricuspid valve leaflets that are approximately 2 cm in length, residing mostly in the right atrial surface. There is associated severe tricuspid regurgitation.. The tricuspid valve  is abnormal. Tricuspid valve regurgitation is severe. 9. The aortic valve is normal in structure. Aortic valve regurgitation is not visualized. 10. There are very small shaggy thin mobile echodensities on the pulmonic valve surface. Trivial pulmonic valve regurgitation. This may represent pulmonic  9/10 left shoulder arthroscopy with extensive debridement, I/D, excisional debridement of the labrum and bursa as well as left shoulder subacromial bursectomy  9/16 application of angio VAC with intraoperative TEE: Right heart cannulation - Debridement of right atrial mass - Debridement of tricuspid valve vegetation  Antibiotics: Anti-infectives (From admission, onward)   Start     Dose/Rate Route Frequency Ordered Stop   04/01/20 2000  vancomycin (VANCOREADY) IVPB 1500 mg/300 mL        1,500 mg 150 mL/hr over 120 Minutes Intravenous Every 12 hours 04/01/20 1140 04/23/20 0759   03/22/20 2000  vancomycin (VANCOREADY) IVPB 1250 mg/250 mL  Status:  Discontinued        1,250 mg 166.7 mL/hr over 90 Minutes Intravenous Every 12 hours 03/22/20 1435 04/01/20 1140   03/20/20 1400  ceFEPIme (MAXIPIME) 2 g in sodium chloride 0.9 % 100 mL IVPB        2 g 200 mL/hr over 30 Minutes Intravenous Every 8 hours 03/20/20 0912 04/22/20 2359   03/19/20 2200  vancomycin (VANCOCIN) IVPB 1000 mg/200 mL premix  Status:  Discontinued        1,000 mg 200 mL/hr over 60 Minutes Intravenous Every 12 hours 03/19/20 1258 03/22/20 1435   03/18/20 1400  cefTRIAXone (ROCEPHIN) 2 g in sodium chloride 0.9 % 100 mL IVPB  Status:  Discontinued        2 g 200 mL/hr over 30 Minutes Intravenous Every 24 hours 03/18/20 1105 03/20/20 0909   03/12/20 2000  cefTRIAXone (ROCEPHIN) 2 g in sodium chloride 0.9 % 100 mL IVPB  Status:  Discontinued        2 g 200 mL/hr over 30 Minutes Intravenous Every 24 hours 03/11/20 2058 03/12/20 0937   03/12/20 1400  ceFEPIme (MAXIPIME) 2 g in sodium chloride 0.9 % 100 mL IVPB  Status:  Discontinued         2 g 200 mL/hr over 30 Minutes Intravenous Every 8 hours 03/12/20 0937 03/18/20 1105   03/12/20 0000  vancomycin (VANCOCIN) IVPB 1000 mg/200 mL premix  Status:  Discontinued        1,000 mg 200 mL/hr over 60 Minutes Intravenous Every 8 hours 03/11/20 1816 03/19/20 1258   03/11/20 1830  cefTRIAXone (ROCEPHIN) 2 g in sodium chloride 0.9 % 100 mL IVPB        2 g 200 mL/hr over 30 Minutes Intravenous  Once 03/11/20 1822 03/11/20 2030   03/11/20 1545  vancomycin (VANCOREADY) IVPB 1500 mg/300 mL        1,500 mg 150 mL/hr over 120 Minutes Intravenous  Once 03/11/20 1532 03/11/20 1819       Time spent: 15 minutes    Junious Silk ANP  [facility] Pager [phone]. If 7PM-7AM, please contact night-coverage at [email or web address] 04/03/2020, 2:32 PM  LOS: 23 days

## 2020-04-03 NOTE — Progress Notes (Signed)
Occupational Therapy Treatment Patient Details Name: Robert Kramer MRN: [id number] DOB: Jun 29, 1976 Today's Date: 04/03/2020    History of present illness Pt is a 44 y.o. male admitted 03/11/20 for evaluation of abnormal blood cultures. Chest CTA shows multiple cavitary and noncavitary nodules consistent with septic emboli; bilateral PNA, bilateral hilar adenopathy. 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 Pt with L shoulder septic arthritis s/p I&D 9/10. S/p catheter-based debridement of tricuspid valve 9/16. PMH includes IVDA, bipolar disorder.  Underwent AngioVac procedure 9/16.   OT comments  Patient with sister present this date.  Patient is improving with his AROM and strength to his L shoulder.  Patient reports pain is getting better as well.  Supine shoulder flexion is approximately 100 degrees, in sit he is close to 60 degrees shoulder flexion with abnormal movement pattern noted - cues not to roll shoulder in towards his ear.  Patient able to walk in room pushing IV pole and perform stand grooming task.  Continue to see patient in the acute setting, OT will see if an order for a shower is appropriate.    Follow Up Recommendations  Supervision - Intermittent    Equipment Recommendations    none.  Discussed overhead pulley with sister.     Recommendations for Other Services      Precautions / Restrictions Precautions Precautions: Fall Restrictions LUE Weight Bearing: Weight bearing as tolerated LLE Weight Bearing: Weight bearing as tolerated Other Position/Activity Restrictions: AROM as tolerated.  No restrictions to L shoulder       Mobility Bed Mobility Overal bed mobility: Independent       Supine to sit: Independent        Transfers Overall transfer level: Needs assistance Equipment used: None Transfers: Sit to/from Stand Sit to Stand: Min guard         General transfer comment: Had patient walk pushing IV pole from windiw side of bed,  around the foot of the bed and to BR for stand grooming.    Balance   Sitting-balance support: No upper extremity supported;Feet supported Sitting balance-Leahy Scale: Good     Standing balance support: Single extremity supported Standing balance-Leahy Scale: Fair                             ADL either performed or assessed with clinical judgement   ADL       Grooming: Supervision/safety;Wash/dry face;Wash/dry hands;Oral care;Standing Grooming Details (indicate cue type and reason): no RW for mobility in the room this date.                             Functional mobility during ADLs: Min guard General ADL Comments: holding IV pole         Exercises Shoulder Exercises Shoulder Flexion: AROM;[name];Seated;10 reps;Supine Shoulder ABduction: AROM;[name];Left;Seated;Supine Shoulder External Rotation: AROM;15 reps;Supine;Seated Other Exercises Other Exercises: seated AA chest press 10 reps and 2 sets Other Exercises: lateral raise AA seated 10 reps and 2 sets. Other Exercises: Isometric to shoulder complex seated - 2 sets and 5 reps to each plane.   Shoulder Instructions       General Comments      Pertinent Vitals/ Pain       Faces Pain Scale: Hurts a little bit Pain Descriptors / Indicators: Discomfort;Grimacing;Guarding Pain Intervention(s): Monitored during session         Frequency  Min 2X/week  Progress Toward Goals  OT Goals(current goals can now be found in the care plan section)  Progress towards OT goals: Progressing toward goals  Acute Rehab OT Goals Patient Stated Goal: Continue to get my arm better. OT Goal Formulation: With patient Time For Goal Achievement: 04/01/20 Potential to Achieve Goals: Good  Plan Frequency remains appropriate    Co-evaluation                 AM-PAC OT "6 Clicks" Daily Activity     Outcome Measure   Help from another person eating meals?: None Help from another person  taking care of personal grooming?: None Help from another person toileting, which includes using toliet, bedpan, or urinal?: None Help from another person bathing (including washing, rinsing, drying)?: A Little Help from another person to put on and taking off regular upper body clothing?: A Little Help from another person to put on and taking off regular lower body clothing?: A Little 6 Click Score: 21    End of Session Equipment Utilized During Treatment: Gait belt  OT Visit Diagnosis: Pain;Muscle weakness (generalized) (M62.81) Pain - Right/Left: Left Pain - part of body: Shoulder   Activity Tolerance Patient tolerated treatment well   Patient Left in bed;with call bell/phone within reach;with family/visitor present   Nurse Communication          Time: 6314-9702 OT Time Calculation (min): 25 min  Charges: OT General Charges $OT Visit: 1 Visit OT Treatments $Self Care/Home Management : 8-22 mins $Therapeutic Exercise: 8-22 mins  04/03/2020  Rich, OTR/L  Acute Rehabilitation Services  Office:  [phone]    Suzanna Obey 04/03/2020, 12:31 PM

## 2020-04-04 LAB — BASIC METABOLIC PANEL
Anion gap: 7 (ref 5–15)
BUN: 17 mg/dL (ref 6–20)
CO2: 30 mmol/L (ref 22–32)
Calcium: 9.2 mg/dL (ref 8.9–10.3)
Chloride: 95 mmol/L — ABNORMAL LOW (ref 98–111)
Creatinine, Ser: 0.62 mg/dL (ref 0.61–1.24)
GFR calc Af Amer: >60 mL/min (ref >60)
GFR calc non Af Amer: >60 mL/min (ref >60)
Glucose, Bld: 103 mg/dL — ABNORMAL HIGH (ref 70–99)
Potassium: 4.2 mmol/L (ref 3.5–5.1)
Sodium: 132 mmol/L — ABNORMAL LOW (ref 135–145)

## 2020-04-04 LAB — CBC
HCT: 28.5 % — ABNORMAL LOW (ref 39.0–52.0)
Hemoglobin: 8.6 g/dL — ABNORMAL LOW (ref 13.0–17.0)
MCH: 27.4 pg (ref 26.0–34.0)
MCHC: 30.2 g/dL (ref 30.0–36.0)
MCV: 90.8 fL (ref 80.0–100.0)
Platelets: 150 10*3/uL (ref 150–400)
RBC: 3.14 MIL/uL — ABNORMAL LOW (ref 4.22–5.81)
RDW: 16.1 % — ABNORMAL HIGH (ref 11.5–15.5)
WBC: 8.1 10*3/uL (ref 4.0–10.5)
nRBC: 0 % (ref 0.0–0.2)

## 2020-04-04 LAB — VANCOMYCIN, TROUGH: Vancomycin Tr: 19 ug/mL (ref 15–20)

## 2020-04-04 MED ORDER — DIPHENHYDRAMINE HCL 25 MG PO CAPS: 25.0000 mg | ORAL_CAPSULE | Freq: Every evening | ORAL | Status: DC | PRN

## 2020-04-04 NOTE — Progress Notes (Signed)
Pharmacy Antibiotic Note  Robert Kramer is a 44 y.o. 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 Angiovac procedure completed 9/16. Additionally, patient has L shoulder septic arthritis s/p I&D 9/10 with culture growing rare MRSA. Pharmacy has been consulted for cefepime dosing for Serratia bacteremia/endocarditis (given ampC organism).  Vancomycin trough came back therapeutic at 19 on 1500 mg IV every 12 hours. Scr 0.62 (CrCl 92 mL/min), afebrile. WBC WNL.   Plan: Continue cefepime 2g q8 hours Change vancomycin to 1500mg  IV q12h Consider a vancomycin level later weekly or if Scr changes    Height: 5\' 11"  (180.3 cm) Weight: 56.6 kg (124 lb 11.2 oz) IBW/kg (Calculated) : 75.3  Temp (24hrs), Avg:98.7 F (37.1 C), Min:98.6 F (37 C), Max:98.8 F (37.1 C)  Recent Labs  Lab 04/01/20 0730 04/04/20 0808  WBC  --  8.1  CREATININE 0.55* 0.62  VANCOTROUGH 14* 19    Estimated Creatinine Clearance: 92.4 mL/min (by C-G formula based on SCr of 0.62 mg/dL).    Allergies  Allergen Reactions  . Tramadol Other (See Comments)    Upset stomach   Antimicrobials this admission: Vancomycin 9/6 >>  Ceftriaxone 9/6 >>9/7, 9/13>>9/15 Cefepime 9/7 >>9/13, 9/15>>   Microbiology results: 9/1 BCx x1: MRSA 9/1 BCx x1: Serratia Marcescens (S= Bactrim, Cipro; R=Cefazolin) 9/6 UCx: mult sp F 9/6 BCx: MRSA F 9/6 COVID: neg 9/8 Hep A reactive 9/8 Hep B NR 9/8 Hep C reactive 9/8 BCx2 ngtd 9/10 MRSA PCR neg  9/10 L shoulder synovial fluid: rare MRSA 9/16 tissue/veg: MRSA  Thank you for allowing pharmacy to be a part of this patient's care.  11/8, PharmD, BCCCP Clinical Pharmacist  Phone: [phone] 04/04/2020 10:36 AM  Please check AMION for all [facility] phone numbers After 10:00 PM, call Main Pharmacy [phone]

## 2020-04-04 NOTE — Progress Notes (Signed)
[facility] PROGRESS NOTE  Robert Kramer BHA:[id number] DOB: 01-Sep-1975 DOA: 03/11/2020 PCP: Patient, No Pcp Per  Status: Inpatient--Remains inpatient appropriate because:Unsafe d/c plan and IV treatments appropriate due to intensity of illness or inability to take PO   Dispo: The patient is from: Home              Anticipated d/c is to: Home              Anticipated d/c date is: > 3 days              Patient currently is not medically stable to d/c.  Needs to complete a full course of IV antibiotics to treat bacteremia and endocarditis.  Patient is an IV drug abuser and it is unsafe for patient to discharge outside of hospital with PICC line in place.  9/28 face-to-face completed regarding home health RN, PT and social work   Code Status: Full Family Communication: Patient only DVT prophylaxis: Lovenox Vaccination status: ?  Has been given [employer] Covid vaccine prior to current admission; was last immunized against influenza in 2018 and did receive pneumococcal vaccine September 2021  HPI: 44 year old male with history of polysubstance use including IV drug use, bipolar disorder came to ED for evaluation of abnormal blood cultures. Patient was seen in [facility] on 03/06/2020 for evaluation management of heroin withdrawal.  He was treated supportively.  Blood cultures were obtained and grew MRSA and Serratia marcescens.  He was called from home to come to ED for further evaluation.  CTA chest PE showed multiple cavitary and noncavitary nodules consistent with septic emboli, bilateral lower lobe pneumonia and bilateral hilar adenopathy felt to be reactive.  No pulmonary embolus was reported.  Patient was started on IV ceftriaxone and vancomycin.  TTE was performed which showed shaggy vegetations on tricuspid valve.  TEE on 03/15/2020 showed 2 x 2 cm long vegetation on tricuspid valve with resultant severe TR.  MRI left shoulder showed septic arthropathy, myositis and tenosynovitis.  He  underwent washout of left shoulder by orthopedic surgery on 03/15/2020.  He was transferred to [facility] on 03/17/2020 for further evaluation of endocarditis by cardiothoracic surgery.  Subjective: Reports some difficulty sleeping overnight.  Requesting Benadryl to use.  Objective: Vitals:   04/03/20 2023 04/04/20 0648  BP: 109/72 109/75  Pulse: 90 85  Resp: 16 13  Temp: 98.6 F (37 C) 98.6 F (37 C)  SpO2: 98% 99%    Intake/Output Summary (Last 24 hours) at 04/04/2020 1219 Last data filed at 04/04/2020 0552 Gross per 24 hour  Intake 1215 ml  Output 1400 ml  Net -185 ml   Filed Weights   03/31/20 0514 04/01/20 0445 04/04/20 0648  Weight: 60.3 kg 57.2 kg 56.6 kg    Exam:  Constitutional: NAD, calm, appears to be comfortable while seated in the bed Respiratory: clear to auscultation bilaterally, no wheezing, no crackles. Normal respiratory effort. No accessory muscle use. RA  Cardiovascular: Regular rate and rhythm, no murmurs / rubs / gallops. No extremity edema. 2+ pedal pulses. No carotid bruits.  Abdomen: no tenderness, no masses palpated. No hepatosplenomegaly. Bowel sounds positive.  Musculoskeletal: no clubbing / cyanosis. No joint deformity upper and lower extremities. Normal muscle tone.  Skin: no rashes, lesions, ulcers. No induration Neurologic: CN 2-12 grossly intact. Sensation intact, DTR normal. Strength 5/5 x all 4 extremities.  Psychiatric: Normal judgment and insight. Alert and oriented x 3. Normal mood.    Assessment/Plan: Severe sepsis  secondary to MRSA and Serratia bacteremia-pulmonary septic emboli, TV endocarditis, left shoulder septic arthritis.   Patient is status post left shoulder arthroscopy with debridement irrigation for septic arthritis as per orthopedics on 03/15/2020.  Echocardiogram showed tricuspid vegetation.  TEE showed 2x2 mobile vegetation with severe TR.   MRI lumbar spine showed no evidence of epidural abscess.  Patient refused MRI right  shoulder ordered by ID.   CT surgery was consulted - s/p angiovac debridement on 03/21/2020.  Infectious disease following.  Continue IV vancomycin, Rocephin was changed to cefepime per ID. Repeat blood cultures drawn on 9/8 negative.  ID recommended 4 more weeks of IV antibiotic therapy starting from 03/21/2020 with vancomycin and cefepime until 04/18/2020.  Rec to call ID back prior to discharge to reassess whether he requires outpatient regimen.  TV endocarditis-s/p Angiovac   CT surgery following.  Insomnia 9/30 begin as needed Benadryl at HS  Hyponatremia sodium is stable at 133 as of 9/23 Has been low since last month.   Serum osmolality is 281.  Substance use disorder Ongoing IV heroin use, last use was 3 days prior to admission.   Patient started on Suboxone 8/2 mg twice daily with as needed Suboxone per opiate withdrawal protocol.   Poke with Dr. Erlinda Hong with Suboxone clinic who agreed patient is an appropriate candidate.  He requested that patient be given number to contact [facility] arrange for outpatient follow-up; patient has completed this request. OUD intake assessment completed by telephone. Patient has been scheduled for 04/24/2020 at 9:45 with Dr. Nedra Hai.  HIV antibodies were nonreactive Hepatitis C antibodies were reactive. Hepatitis C genotype 1b.  Patient can follow-up with GI as outpatient for hepatitis C.  Thrombocytopenia Likely due to sepsis.   Platelet count has improved to 136,000  Anemia Hemoglobin  stable at 10.2.   Patient's hemoglobin dropped after surgery, and was transfused 1 unit PRBC.    Anemia panel obtained shows iron 25, saturation 13%, TIBC 195, B12 440, reticulocyte count percent 6.1, immature fraction 27.8.   9/22 started 3 times daily iron replacement with twice daily scheduled Colace to prevent iron-induced constipation Haptoglobin is normal at 202    Data Reviewed: Basic Metabolic Panel: Recent Labs  Lab  04/01/20 0730 04/04/20 0808  NA 130* 132*  K 3.9 4.2  CL 96* 95*  CO2 30 30  GLUCOSE 125* 103*  BUN 20 17  CREATININE 0.55* 0.62  CALCIUM 9.1 9.2   Liver Function Tests: No results for input(s): AST, ALT, ALKPHOS, BILITOT, PROT, ALBUMIN in the last 168 hours. No results for input(s): LIPASE, AMYLASE in the last 168 hours. No results for input(s): AMMONIA in the last 168 hours. CBC: Recent Labs  Lab 04/04/20 0808  WBC 8.1  HGB 8.6*  HCT 28.5*  MCV 90.8  PLT 150   Cardiac Enzymes: No results for input(s): CKTOTAL, CKMB, CKMBINDEX, TROPONINI in the last 168 hours. BNP (last 3 results) No results for input(s): BNP in the last 8760 hours.  ProBNP (last 3 results) No results for input(s): PROBNP in the last 8760 hours.  CBG: No results for input(s): GLUCAP in the last 168 hours.  No results found for this or any previous visit (from the past 240 hour(s)).   Studies: No results found.  Scheduled Meds: . buprenorphine-naloxone  1 tablet Sublingual BID  . Chlorhexidine Gluconate Cloth  6 each Topical Daily  . docusate sodium  100 mg Oral BID  . enoxaparin (LOVENOX) injection  40  mg Subcutaneous Q24H  . feeding supplement (ENSURE ENLIVE)  237 mL Oral TID BM  . ferrous sulfate  325 mg Oral TID WC  . multivitamin with minerals  1 tablet Oral Daily  . sodium chloride flush  10-40 mL Intracatheter Q12H   Continuous Infusions: . ceFEPime (MAXIPIME) IV 2 g (04/04/20 0552)  . vancomycin 1,500 mg (04/04/20 0827)    Principal Problem:   MRSA bacteremia Active Problems:   Polysubstance dependence (HCC)   Hypokalemia   Thrombocytopenia (HCC)   Serratia septicemia (HCC)   Bipolar 1 disorder (HCC)   Heroin abuse (HCC)   Arthritis, septic, shoulder (HCC)   Endocarditis of tricuspid valve   Hepatitis C virus infection   IVDU (intravenous drug user)   Normocytic anemia   Cigarette smoker   Septic pulmonary embolism (HCC)   Pressure injury of skin   Lumbar back  pain   Consultants:  Orthopedics  CVTS  Infectious disease  Procedures: 9/7 2D echocardiogram: 1. Left ventricular ejection fraction, by estimation, is 60 to 65%. The left ventricle has normal function. The left ventricle has no regional wall motion abnormalities. Left ventricular diastolic function could not be evaluated. 2. Right ventricular systolic function is normal. The right ventricular size is normal. There is normal pulmonary artery systolic pressure. The estimated right ventricular systolic pressure is 31.5 mmHg. 3. The mitral valve is normal in structure. No evidence of mitral valve regurgitation. No evidence of mitral stenosis. 4. There are several large shaggy mobile densities one of which measures 1.27 x 1.54cm on the TV worrisome for vegetations. The tricuspid valve is abnormal. 5. The aortic valve is normal in structure. Aortic valve regurgitation is not visualized. No aortic stenosis is present. 6. The inferior vena cava is normal in size with greater than 50% respiratory variability, suggesting right atrial pressure of 3 mmHg.  9/10 TEE: 1. Tricuspid valve endocarditis. 2. Left ventricular ejection fraction, by estimation, is 60 to 65%. The left ventricle has normal function. 3. Right ventricular systolic function is normal. The right ventricular size is normal. 4. No left atrial/left atrial appendage thrombus was detected. 5. Right atrial size was mildly dilated. 6. A small pericardial effusion is present. The pericardial effusion is circumferential. There is no evidence of cardiac tamponade. 7. The mitral valve is normal in structure. Trivial mitral valve regurgitation. 8. There are 2 separate elongated valvular vegetations along the medial and lateral tricuspid valve leaflets that are approximately 2 cm in length, residing mostly in the right atrial surface. There is associated severe tricuspid regurgitation.. The tricuspid valve is abnormal. Tricuspid valve  regurgitation is severe. 9. The aortic valve is normal in structure. Aortic valve regurgitation is not visualized. 10. There are very small shaggy thin mobile echodensities on the pulmonic valve surface. Trivial pulmonic valve regurgitation. This may represent pulmonic  9/10 left shoulder arthroscopy with extensive debridement, I/D, excisional debridement of the labrum and bursa as well as left shoulder subacromial bursectomy  9/16 application of angio VAC with intraoperative TEE: Right heart cannulation - Debridement of right atrial mass - Debridement of tricuspid valve vegetation  Antibiotics: Anti-infectives (From admission, onward)   Start     Dose/Rate Route Frequency Ordered Stop   04/01/20 2000  vancomycin (VANCOREADY) IVPB 1500 mg/300 mL        1,500 mg 150 mL/hr over 120 Minutes Intravenous Every 12 hours 04/01/20 1140 04/23/20 0759   03/22/20 2000  vancomycin (VANCOREADY) IVPB 1250 mg/250 mL  Status:  Discontinued  1,250 mg 166.7 mL/hr over 90 Minutes Intravenous Every 12 hours 03/22/20 1435 04/01/20 1140   03/20/20 1400  ceFEPIme (MAXIPIME) 2 g in sodium chloride 0.9 % 100 mL IVPB        2 g 200 mL/hr over 30 Minutes Intravenous Every 8 hours 03/20/20 0912 04/22/20 2359   03/19/20 2200  vancomycin (VANCOCIN) IVPB 1000 mg/200 mL premix  Status:  Discontinued        1,000 mg 200 mL/hr over 60 Minutes Intravenous Every 12 hours 03/19/20 1258 03/22/20 1435   03/18/20 1400  cefTRIAXone (ROCEPHIN) 2 g in sodium chloride 0.9 % 100 mL IVPB  Status:  Discontinued        2 g 200 mL/hr over 30 Minutes Intravenous Every 24 hours 03/18/20 1105 03/20/20 0909   03/12/20 2000  cefTRIAXone (ROCEPHIN) 2 g in sodium chloride 0.9 % 100 mL IVPB  Status:  Discontinued        2 g 200 mL/hr over 30 Minutes Intravenous Every 24 hours 03/11/20 2058 03/12/20 0937   03/12/20 1400  ceFEPIme (MAXIPIME) 2 g in sodium chloride 0.9 % 100 mL IVPB  Status:  Discontinued        2 g 200 mL/hr over 30  Minutes Intravenous Every 8 hours 03/12/20 0937 03/18/20 1105   03/12/20 0000  vancomycin (VANCOCIN) IVPB 1000 mg/200 mL premix  Status:  Discontinued        1,000 mg 200 mL/hr over 60 Minutes Intravenous Every 8 hours 03/11/20 1816 03/19/20 1258   03/11/20 1830  cefTRIAXone (ROCEPHIN) 2 g in sodium chloride 0.9 % 100 mL IVPB        2 g 200 mL/hr over 30 Minutes Intravenous  Once 03/11/20 1822 03/11/20 2030   03/11/20 1545  vancomycin (VANCOREADY) IVPB 1500 mg/300 mL        1,500 mg 150 mL/hr over 120 Minutes Intravenous  Once 03/11/20 1532 03/11/20 1819       Time spent: 15 minutes    Junious Silk ANP  [facility] Pager [phone]. If 7PM-7AM, please contact night-coverage at [email or web address] 04/04/2020, 12:19 PM  LOS: 24 days

## 2020-04-04 NOTE — Progress Notes (Signed)
Physical Therapy Treatment Patient Details Name: Robert Kramer MRN: [id number] DOB: [birth year] Today's Date: 04/04/2020    History of Present Illness Pt is a 44 y.o. male admitted 03/11/20 for evaluation of abnormal blood cultures. Chest CTA shows multiple cavitary and noncavitary nodules consistent with septic emboli; bilateral PNA, bilateral hilar adenopathy. 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 Pt with L shoulder septic arthritis s/p I&D 9/10. S/p catheter-based debridement of tricuspid valve 9/16. PMH includes IVDA, bipolar disorder.  Underwent AngioVac procedure 9/16.    PT Comments    With increased encouragement, pt agreeable to ambulation in room, once pt at door encouraged ambulation in hallway and pt agreeable. Pt continues to be self limiting and has decreased strength, ROM and endurance. Pt is independent for bed mobility, supervision for transfers and min guard for ambulation of 150 feet with RW. D/c plans remain appropriate at this time. PT will continue to follow acutely.    Follow Up Recommendations  Home health PT;Supervision/Assistance - 24 hour     Equipment Recommendations  Rolling walker with 5" wheels       Precautions / Restrictions Precautions Precautions: Fall Restrictions Weight Bearing Restrictions: Yes LUE Weight Bearing: Weight bearing as tolerated    Mobility  Bed Mobility Overal bed mobility: Independent                Transfers Overall transfer level: Needs assistance Equipment used: None Transfers: Sit to/from Stand Sit to Stand: Supervision         General transfer comment: for power up to standing before reaching for RW  Ambulation/Gait Ambulation/Gait assistance: Min guard Gait Distance (Feet): 150 Feet Assistive device: Rolling walker (2 wheeled) Gait Pattern/deviations: Step-through pattern;Decreased stride length;Staggering left Gait velocity: Decreased   General Gait Details: min guard for safety,  for slow gait, slightly impulsive with RW management around obstacles in hallway,vc for upright posture          Balance Overall balance assessment: Needs assistance Sitting-balance support: No upper extremity supported;Feet supported Sitting balance-Leahy Scale: Good     Standing balance support: Single extremity supported Standing balance-Leahy Scale: Fair Standing balance comment: Can static stand without UE support                            Cognition Arousal/Alertness: Awake/alert Behavior During Therapy: WFL for tasks assessed/performed Overall Cognitive Status: Within Functional Limits for tasks assessed                                           General Comments General comments (skin integrity, edema, etc.): VSS on RA       Pertinent Vitals/Pain Pain Assessment: Faces Faces Pain Scale: Hurts a little bit Pain Location: neck, L shoulder Pain Descriptors / Indicators: Discomfort;Grimacing;Guarding Pain Intervention(s): Limited activity within patient's tolerance;Monitored during session;Repositioned           PT Goals (current goals can now be found in the care plan section) Acute Rehab PT Goals Patient Stated Goal: Continue to get my arm better. PT Goal Formulation: With patient Time For Goal Achievement: 04/15/20 Potential to Achieve Goals: Good Progress towards PT goals: Progressing toward goals    Frequency    Min 3X/week      PT Plan Current plan remains appropriate       AM-PAC PT "6 Clicks" Mobility  Outcome Measure  Help needed turning from your back to your side while in a flat bed without using bedrails?: None Help needed moving from lying on your back to sitting on the side of a flat bed without using bedrails?: A Little Help needed moving to and from a bed to a chair (including a wheelchair)?: A Little Help needed standing up from a chair using your arms (e.g., wheelchair or bedside chair)?: A Little Help  needed to walk in hospital room?: A Little Help needed climbing 3-5 steps with a railing? : A Little 6 Click Score: 19    End of Session Equipment Utilized During Treatment: Gait belt Activity Tolerance: Patient tolerated treatment well;Patient limited by fatigue;Patient limited by pain Patient left: in bed;with call bell/phone within reach Nurse Communication: Mobility status PT Visit Diagnosis: Difficulty in walking, not elsewhere classified (R26.2);Other abnormalities of gait and mobility (R26.89)     Time: 3953-2023 PT Time Calculation (min) (ACUTE ONLY): 17 min  Charges:  $Gait Training: 8-22 mins                     Robert Kramer B. Robert Kramer PT, DPT Acute Rehabilitation Services Pager [phone] Office [phone]    Robert Kramer Robert Kramer 04/04/2020, 4:10 PM

## 2020-04-05 LAB — RETICULOCYTES
Immature Retic Fract: 19 % — ABNORMAL HIGH (ref 2.3–15.9)
RBC.: 3.23 MIL/uL — ABNORMAL LOW (ref 4.22–5.81)
Retic Count, Absolute: 74.6 10*3/uL (ref 19.0–186.0)
Retic Ct Pct: 2.3 % (ref 0.4–3.1)

## 2020-04-05 LAB — IRON AND TIBC
Iron: 38 ug/dL — ABNORMAL LOW (ref 45–182)
Saturation Ratios: 14 % — ABNORMAL LOW (ref 17.9–39.5)
TIBC: 272 ug/dL (ref 250–450)
UIBC: 234 ug/dL

## 2020-04-05 LAB — FERRITIN: Ferritin: 558 ng/mL — ABNORMAL HIGH (ref 24–336)

## 2020-04-05 LAB — VITAMIN B12: Vitamin B-12: 425 pg/mL (ref 180–914)

## 2020-04-05 LAB — TSH: TSH: 2.254 u[IU]/mL (ref 0.350–4.500)

## 2020-04-05 LAB — FOLATE: Folate: 23.4 ng/mL (ref >5.9)

## 2020-04-05 NOTE — Progress Notes (Addendum)
     Subjective:  Reports no pain in left shoulder today. Denies pain in any other joints.   Objective:  PE: VITALS:   Vitals:   04/03/20 2023 04/04/20 0648 04/04/20 1439 04/04/20 1958  BP: 109/72 109/75 114/78 114/73  Pulse: 90 85 84 85  Resp: 16 13 17 19   Temp: 98.6 F (37 C) 98.6 F (37 C) (!) 97.5 F (36.4 C) 98.1 F (36.7 C)  TempSrc: Oral Oral Oral Oral  SpO2: 98% 99% 99% 99%  Weight:  56.6 kg    Height:        General: laying in bed, in no acute distress MSK: 0-50 degrees forward flexion of left shoulder while sitting up in bed, 70 degrees abduction, 15 degrees external rotation.No TTP to shoulder. Surgical wounds have healed well with no drainage. Full ROM through left elbow, wrist, and all fingers of left hand. Distal sensation intact.+ radial pulse.    LABS  Results for orders placed or performed during the hospital encounter of 03/11/20 (from the past 24 hour(s))  Vitamin B12     Status: None   Collection Time: 04/05/20  9:20 AM  Result Value Ref Range   Vitamin B-12 425 180 - 914 pg/mL  Iron and TIBC     Status: Abnormal   Collection Time: 04/05/20  9:20 AM  Result Value Ref Range   Iron 38 (L) 45 - 182 ug/dL   TIBC 06/05/20 765 - 465 ug/dL   Saturation Ratios 14 (L) 17.9 - 39.5 %   UIBC 234 ug/dL  Ferritin     Status: Abnormal   Collection Time: 04/05/20  9:20 AM  Result Value Ref Range   Ferritin 558 (H) 24 - 336 ng/mL  Reticulocytes     Status: Abnormal   Collection Time: 04/05/20  9:20 AM  Result Value Ref Range   Retic Ct Pct 2.3 0.4 - 3.1 %   RBC. 3.23 (L) 4.22 - 5.81 MIL/uL   Retic Count, Absolute 74.6 19.0 - 186.0 K/uL   Immature Retic Fract 19.0 (H) 2.3 - 15.9 %  TSH     Status: None   Collection Time: 04/05/20  9:20 AM  Result Value Ref Range   TSH 2.254 0.350 - 4.500 uIU/mL  Folate     Status: None   Collection Time: 04/05/20  9:20 AM  Result Value Ref Range   Folate 23.4 >5.9 ng/mL    No results  found.  Assessment/Plan: Principal Problem:   MRSA bacteremia Active Problems:   Polysubstance dependence (HCC)   Hypokalemia   Thrombocytopenia (HCC)   Serratia septicemia (HCC)   Bipolar 1 disorder (HCC)   Heroin abuse (HCC)   Arthritis, septic, shoulder (HCC)   Endocarditis of tricuspid valve   Hepatitis C virus infection   IVDU (intravenous drug user)   Normocytic anemia   Cigarette smoker   Septic pulmonary embolism (HCC)   Pressure injury of skin   Lumbar back pain  L shoulder septic arthritis: - pain has improved, slight ROM improvements over the last week, but looks to be making larger improvements with OT - continue PT/OT - WBAT as tolerated, can perform motion as tolerated  Will continue to follow along intermittently  Contact information:   Weekdays 8-5 03-09-2006, PA-C [phone] A fter hours and holidays please check [email or web address] for group call information for [facility]  [phone] 04/05/2020, 4:00 PM

## 2020-04-05 NOTE — Progress Notes (Signed)
[facility] PROGRESS NOTE  Robert Kramer DZH:[id number] DOB: [birth year] DOA: 03/11/2020 PCP: Patient, No Pcp Per  Status: Inpatient--Remains inpatient appropriate because:Unsafe d/c plan and IV treatments appropriate due to intensity of illness or inability to take PO   Dispo: The patient is from: Home              Anticipated d/c is to: Home              Anticipated d/c date is: > 3 days              Patient currently is not medically stable to d/c.  Needs to complete a full course of IV antibiotics to treat bacteremia and endocarditis.  Patient is an IV drug abuser and it is unsafe for patient to discharge outside of hospital with PICC line in place.  9/28 face-to-face completed regarding home health RN, PT and social work-as of 10/1 patient ready refused home health services including PT.  He is agreeable to receipt of a rolling walker.   Code Status: Full Family Communication: Patient only DVT prophylaxis: Lovenox Vaccination status: ?  Has been given [employer] Covid vaccine prior to current admission; was last immunized against influenza in 2018 and did receive pneumococcal vaccine September 2021  HPI: 44 year old male with history of polysubstance use including IV drug use, bipolar disorder came to ED for evaluation of abnormal blood cultures. Patient was seen in [facility] on 03/06/2020 for evaluation management of heroin withdrawal.  He was treated supportively.  Blood cultures were obtained and grew MRSA and Serratia marcescens.  He was called from home to come to ED for further evaluation.  CTA chest PE showed multiple cavitary and noncavitary nodules consistent with septic emboli, bilateral lower lobe pneumonia and bilateral hilar adenopathy felt to be reactive.  No pulmonary embolus was reported.  Patient was started on IV ceftriaxone and vancomycin.  TTE was performed which showed shaggy vegetations on tricuspid valve.  TEE on 03/15/2020 showed 2 x 2 cm long vegetation on  tricuspid valve with resultant severe TR.  MRI left shoulder showed septic arthropathy, myositis and tenosynovitis.  He underwent washout of left shoulder by orthopedic surgery on 03/15/2020.  He was transferred to [facility] on 03/17/2020 for further evaluation of endocarditis by cardiothoracic surgery.  Subjective: No complaints upon entry into the room.  Apparently sleeping better..  Objective: Vitals:   04/04/20 1439 04/04/20 1958  BP: 114/78 114/73  Pulse: 84 85  Resp: 17 19  Temp: (!) 97.5 F (36.4 C) 98.1 F (36.7 C)  SpO2: 99% 99%    Intake/Output Summary (Last 24 hours) at 04/05/2020 1034 Last data filed at 04/05/2020 0039 Gross per 24 hour  Intake 400 ml  Output 300 ml  Net 100 ml   Filed Weights   03/31/20 0514 04/01/20 0445 04/04/20 0648  Weight: 60.3 kg 57.2 kg 56.6 kg    Exam:  Constitutional: NAD, calm, appears to be comfortable while seated in the bed Respiratory: clear to auscultation bilaterally, no wheezing, no crackles. Normal respiratory effort.RA  Cardiovascular: Regular rate and rhythm, no murmurs / rubs / gallops. No extremity edema. 2+ pedal pulses. No carotid bruits.  Abdomen: no tenderness, no masses palpated.  Tolerating solid diet.  Bowel sounds positive.  Musculoskeletal: no clubbing / cyanosis. No joint deformity upper and lower extremities. Normal muscle tone.  Neurologic: CN 2-12 grossly intact. Sensation intact, DTR normal. Strength 5/5 x all 4 extremities.  Psychiatric: Normal judgment and insight.  Alert and oriented x 3. Normal mood.    Assessment/Plan: Severe sepsis secondary to MRSA and Serratia bacteremia-pulmonary septic emboli, TV endocarditis, left shoulder septic arthritis.   Patient is status post left shoulder arthroscopy with debridement irrigation for septic arthritis as per orthopedics on 03/15/2020.  Echocardiogram showed tricuspid vegetation.  TEE showed 2x2 mobile vegetation with severe TR.   MRI lumbar spine showed no evidence  of epidural abscess.  Patient refused MRI right shoulder ordered by ID.   CT surgery was consulted - s/p angiovac debridement on 03/21/2020.  Infectious disease following.  Continue IV vancomycin, Rocephin was changed to cefepime per ID. Repeat blood cultures drawn on 9/8 negative.  ID recommended 4 more weeks of IV antibiotic therapy starting from 03/21/2020 with vancomycin and cefepime until 04/18/2020.  Rec to call ID back prior to discharge to reassess whether he requires outpatient regimen.  TV endocarditis-s/p Angiovac   CT surgery following.  Insomnia 9/30 begin as needed Benadryl at HS  Hyponatremia sodium is stable at 133 as of 9/23 Has been low since last month.   Serum osmolality is 281.  Substance use disorder Ongoing IV heroin use, last use was 3 days prior to admission.   Patient started on Suboxone 8/2 mg twice daily with as needed Suboxone per opiate withdrawal protocol.   Poke with Dr. Erlinda Hong with Suboxone clinic who agreed patient is an appropriate candidate.  He requested that patient be given number to contact [facility] arrange for outpatient follow-up; patient has completed this request. OUD intake assessment completed by telephone. Patient has been scheduled for 04/24/2020 at 9:45 with Dr. Nedra Hai.  HIV antibodies were nonreactive Hepatitis C antibodies were reactive. Hepatitis C genotype 1b.  Patient can follow-up with GI as outpatient for hepatitis C.  Thrombocytopenia Likely due to sepsis.   Platelet count has improved to 136,000  Iron deficiency anemia Hemoglobin stable at 8.4  Patient's hemoglobin dropped after surgery, and was transfused 1 unit PRBC.    Anemia panel obtained on 9/15 showed iron 25, saturation 13%, TIBC 195, B12 440, reticulocyte count percent 6.1, immature fraction 27.8.   9/22 initiated iron TID with twice daily scheduled Colace to prevent iron-induced constipation Haptoglobin is normal at 202 10/1 will repeat anemia  panel and if iron remains significantly low would likely benefit from IV iron    Data Reviewed: Basic Metabolic Panel: Recent Labs  Lab 04/01/20 0730 04/04/20 0808  NA 130* 132*  K 3.9 4.2  CL 96* 95*  CO2 30 30  GLUCOSE 125* 103*  BUN 20 17  CREATININE 0.55* 0.62  CALCIUM 9.1 9.2   Liver Function Tests: No results for input(s): AST, ALT, ALKPHOS, BILITOT, PROT, ALBUMIN in the last 168 hours. No results for input(s): LIPASE, AMYLASE in the last 168 hours. No results for input(s): AMMONIA in the last 168 hours. CBC: Recent Labs  Lab 04/04/20 0808  WBC 8.1  HGB 8.6*  HCT 28.5*  MCV 90.8  PLT 150   Cardiac Enzymes: No results for input(s): CKTOTAL, CKMB, CKMBINDEX, TROPONINI in the last 168 hours. BNP (last 3 results) No results for input(s): BNP in the last 8760 hours.  ProBNP (last 3 results) No results for input(s): PROBNP in the last 8760 hours.  CBG: No results for input(s): GLUCAP in the last 168 hours.  No results found for this or any previous visit (from the past 240 hour(s)).   Studies: No results found.  Scheduled Meds: . buprenorphine-naloxone  1  tablet Sublingual BID  . Chlorhexidine Gluconate Cloth  6 each Topical Daily  . docusate sodium  100 mg Oral BID  . enoxaparin (LOVENOX) injection  40 mg Subcutaneous Q24H  . feeding supplement (ENSURE ENLIVE)  237 mL Oral TID BM  . ferrous sulfate  325 mg Oral TID WC  . multivitamin with minerals  1 tablet Oral Daily  . sodium chloride flush  10-40 mL Intracatheter Q12H   Continuous Infusions: . ceFEPime (MAXIPIME) IV 2 g (04/05/20 0524)  . vancomycin 1,500 mg (04/05/20 0916)    Principal Problem:   MRSA bacteremia Active Problems:   Polysubstance dependence (HCC)   Hypokalemia   Thrombocytopenia (HCC)   Serratia septicemia (HCC)   Bipolar 1 disorder (HCC)   Heroin abuse (HCC)   Arthritis, septic, shoulder (HCC)   Endocarditis of tricuspid valve   Hepatitis C virus infection   IVDU  (intravenous drug user)   Normocytic anemia   Cigarette smoker   Septic pulmonary embolism (HCC)   Pressure injury of skin   Lumbar back pain   Consultants:  Orthopedics  CVTS  Infectious disease  Procedures: 9/7 2D echocardiogram: 1. Left ventricular ejection fraction, by estimation, is 60 to 65%. The left ventricle has normal function. The left ventricle has no regional wall motion abnormalities. Left ventricular diastolic function could not be evaluated. 2. Right ventricular systolic function is normal. The right ventricular size is normal. There is normal pulmonary artery systolic pressure. The estimated right ventricular systolic pressure is 31.5 mmHg. 3. The mitral valve is normal in structure. No evidence of mitral valve regurgitation. No evidence of mitral stenosis. 4. There are several large shaggy mobile densities one of which measures 1.27 x 1.54cm on the TV worrisome for vegetations. The tricuspid valve is abnormal. 5. The aortic valve is normal in structure. Aortic valve regurgitation is not visualized. No aortic stenosis is present. 6. The inferior vena cava is normal in size with greater than 50% respiratory variability, suggesting right atrial pressure of 3 mmHg.  9/10 TEE: 1. Tricuspid valve endocarditis. 2. Left ventricular ejection fraction, by estimation, is 60 to 65%. The left ventricle has normal function. 3. Right ventricular systolic function is normal. The right ventricular size is normal. 4. No left atrial/left atrial appendage thrombus was detected. 5. Right atrial size was mildly dilated. 6. A small pericardial effusion is present. The pericardial effusion is circumferential. There is no evidence of cardiac tamponade. 7. The mitral valve is normal in structure. Trivial mitral valve regurgitation. 8. There are 2 separate elongated valvular vegetations along the medial and lateral tricuspid valve leaflets that are approximately 2 cm in length, residing  mostly in the right atrial surface. There is associated severe tricuspid regurgitation.. The tricuspid valve is abnormal. Tricuspid valve regurgitation is severe. 9. The aortic valve is normal in structure. Aortic valve regurgitation is not visualized. 10. There are very small shaggy thin mobile echodensities on the pulmonic valve surface. Trivial pulmonic valve regurgitation. This may represent pulmonic  9/10 left shoulder arthroscopy with extensive debridement, I/D, excisional debridement of the labrum and bursa as well as left shoulder subacromial bursectomy  9/16 application of angio VAC with intraoperative TEE: Right heart cannulation - Debridement of right atrial mass - Debridement of tricuspid valve vegetation  Antibiotics: Anti-infectives (From admission, onward)   Start     Dose/Rate Route Frequency Ordered Stop   04/01/20 2000  vancomycin (VANCOREADY) IVPB 1500 mg/300 mL        1,500 mg 150 mL/hr  over 120 Minutes Intravenous Every 12 hours 04/01/20 1140 04/23/20 0759   03/22/20 2000  vancomycin (VANCOREADY) IVPB 1250 mg/250 mL  Status:  Discontinued        1,250 mg 166.7 mL/hr over 90 Minutes Intravenous Every 12 hours 03/22/20 1435 04/01/20 1140   03/20/20 1400  ceFEPIme (MAXIPIME) 2 g in sodium chloride 0.9 % 100 mL IVPB        2 g 200 mL/hr over 30 Minutes Intravenous Every 8 hours 03/20/20 0912 04/22/20 2359   03/19/20 2200  vancomycin (VANCOCIN) IVPB 1000 mg/200 mL premix  Status:  Discontinued        1,000 mg 200 mL/hr over 60 Minutes Intravenous Every 12 hours 03/19/20 1258 03/22/20 1435   03/18/20 1400  cefTRIAXone (ROCEPHIN) 2 g in sodium chloride 0.9 % 100 mL IVPB  Status:  Discontinued        2 g 200 mL/hr over 30 Minutes Intravenous Every 24 hours 03/18/20 1105 03/20/20 0909   03/12/20 2000  cefTRIAXone (ROCEPHIN) 2 g in sodium chloride 0.9 % 100 mL IVPB  Status:  Discontinued        2 g 200 mL/hr over 30 Minutes Intravenous Every 24 hours 03/11/20 2058 03/12/20  0937   03/12/20 1400  ceFEPIme (MAXIPIME) 2 g in sodium chloride 0.9 % 100 mL IVPB  Status:  Discontinued        2 g 200 mL/hr over 30 Minutes Intravenous Every 8 hours 03/12/20 0937 03/18/20 1105   03/12/20 0000  vancomycin (VANCOCIN) IVPB 1000 mg/200 mL premix  Status:  Discontinued        1,000 mg 200 mL/hr over 60 Minutes Intravenous Every 8 hours 03/11/20 1816 03/19/20 1258   03/11/20 1830  cefTRIAXone (ROCEPHIN) 2 g in sodium chloride 0.9 % 100 mL IVPB        2 g 200 mL/hr over 30 Minutes Intravenous  Once 03/11/20 1822 03/11/20 2030   03/11/20 1545  vancomycin (VANCOREADY) IVPB 1500 mg/300 mL        1,500 mg 150 mL/hr over 120 Minutes Intravenous  Once 03/11/20 1532 03/11/20 1819       Time spent: 25 minutes    Junious Silkllison Charlyn Vialpando ANP  [facility] Pager [phone]. If 7PM-7AM, please contact night-coverage at [email or web address] 04/05/2020, 10:34 AM  LOS: 25 days

## 2020-04-05 NOTE — TOC Transition Note (Signed)
Transition of Care [facility]) - CM/SW Discharge Note   Patient Details  Name: Robert Kramer MRN: [id number] Date of Birth: 10-Aug-1975  [facility] CM/SW Contact:  Gala Lewandowsky, RN Phone Number: 04/05/2020, 10:12 AM   Clinical Narrative:  Case Manager spoke with patient regarding plan of care. Patient has declined home health services- patient states he will return to his sisters home with supervision. Patient is agreeable to the rolling walker. Case Manager made the referral via [employer] and durable medical equipment will be delivered today. Case Manager will continue to follow for additional transition of care needs.   Final next level of care: Home/Self Care Barriers to Discharge: Continued Medical Work up   Patient Goals and CMS Choice Patient states their goals for this hospitalization and ongoing recovery are:: to go home CMS Medicare.gov Compare Post Acute Care list provided to:: Patient Choice offered to / list presented to : NA   Discharge Plan and Services In-house Referral: NA Discharge Planning Services: CM Consult            DME Arranged: Dan Humphreys rolling   Date DME Agency Contacted: 04/05/20 Time DME Agency Contacted: 1011 Representative spoke with at DME Agency: Velna Hatchet HH Arranged: Refused HH HH Agency: NA     Readmission Risk Interventions Readmission Risk Prevention Plan 03/27/2020  Transportation Screening Complete  PCP or Specialist Appt within 3-5 Days Complete  HRI or Home Care Consult Complete  Social Work Consult for Recovery Care Planning/Counseling Complete  Palliative Care Screening Not Applicable  Medication Review [employer]) Complete  Some recent data might be hidden

## 2020-04-06 NOTE — Progress Notes (Signed)
Occupational Therapy Treatment Patient Details Name: Robert Kramer MRN: [id number] DOB: 1975-07-14 Today's Date: 04/06/2020    History of present illness Pt is a 44 y.o. male admitted 03/11/20 for evaluation of abnormal blood cultures. Chest CTA shows multiple cavitary and noncavitary nodules consistent with septic emboli; bilateral PNA, bilateral hilar adenopathy. 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 Pt with L shoulder septic arthritis s/p I&D 9/10. S/p catheter-based debridement of tricuspid valve 9/16. PMH includes IVDA, bipolar disorder.  Underwent AngioVac procedure 9/16.   OT comments  Patient progressing with LUE exercises, pt reports completing exercises throughout the day.  Focused on L shoulder ROM today, sitting EOB patient able to complete AROM of L shoulder to approx 60* but when instructed to complete [name] (using R UE to support minimally) pt able to reach 90* comfortably.  Encouraged completion of [name] to increase ROM, strength and tolerance, slowly decreasing reliance on R UE.  Further exercises completed below.  DC plan remains appropriate. Will follow.    Follow Up Recommendations  Supervision - Intermittent    Equipment Recommendations       Recommendations for Other Services      Precautions / Restrictions Precautions Precautions: Fall Restrictions Weight Bearing Restrictions: Yes LUE Weight Bearing: Weight bearing as tolerated Other Position/Activity Restrictions: AROM as tolerated.  No restrictions to L shoulder       Mobility Bed Mobility Overal bed mobility: Independent                Transfers Overall transfer level: Needs assistance Equipment used: None Transfers: Sit to/from Stand Sit to Stand: Supervision         General transfer comment: for safety, pt using IV pole per preference    Balance Overall balance assessment: Needs assistance Sitting-balance support: No upper extremity supported;Feet  supported Sitting balance-Leahy Scale: Good     Standing balance support: Single extremity supported Standing balance-Leahy Scale: Fair Standing balance comment: Can static stand without UE support                           ADL either performed or assessed with clinical judgement   ADL Overall ADL's : Needs assistance/impaired                         Toilet Transfer: Supervision/safety;Ambulation Toilet Transfer Details (indicate cue type and reason): using IV pole, simulated in room           Functional mobility during ADLs: Supervision/safety General ADL Comments: holding IV pole     Vision       Perception     Praxis      Cognition Arousal/Alertness: Awake/alert Behavior During Therapy: WFL for tasks assessed/performed Overall Cognitive Status: Within Functional Limits for tasks assessed                                          Exercises Exercises: General Upper Extremity;Other exercises (2 sets of listed exercises below ) General Exercises - Upper Extremity Shoulder Flexion: [name];Left;10 reps;Seated (to 90*) Shoulder Extension: AROM;Left;10 reps;Seated Shoulder ABduction: AROM;Left;10 reps;Seated (to 45*) Shoulder ADduction: AROM;Left;10 reps;Seated Other Exercises Other Exercises: L shoulder IR/ER, shoulder elevation 2 sets 10 reps  Other Exercises: Wall slides x 5 reps, min cueing for technique    Shoulder Instructions  General Comments VSS on RA     Pertinent Vitals/ Pain       Pain Assessment: 0-10 Pain Score: 7  Pain Location: neck, L shoulder Pain Descriptors / Indicators: Discomfort;Sore Pain Intervention(s): Limited activity within patient's tolerance;Monitored during session;Repositioned  Home Living                                          Prior Functioning/Environment              Frequency  Min 2X/week        Progress Toward Goals  OT Goals(current goals can  now be found in the care plan section)  Progress towards OT goals: Progressing toward goals  Acute Rehab OT Goals Patient Stated Goal: Continue to get my arm better. OT Goal Formulation: With patient  Plan Frequency remains appropriate;Discharge plan remains appropriate    Co-evaluation                 AM-PAC OT "6 Clicks" Daily Activity     Outcome Measure   Help from another person eating meals?: None Help from another person taking care of personal grooming?: None Help from another person toileting, which includes using toliet, bedpan, or urinal?: None Help from another person bathing (including washing, rinsing, drying)?: A Little Help from another person to put on and taking off regular upper body clothing?: A Little Help from another person to put on and taking off regular lower body clothing?: A Little 6 Click Score: 21    End of Session    OT Visit Diagnosis: Pain;Muscle weakness (generalized) (M62.81) Pain - Right/Left: Left Pain - part of body: Shoulder   Activity Tolerance Patient tolerated treatment well   Patient Left in bed;with call bell/phone within reach   Nurse Communication          Time: 1353-1411 OT Time Calculation (min): 18 min  Charges: OT General Charges $OT Visit: 1 Visit OT Treatments $Therapeutic Exercise: 8-22 mins  Barry Brunner, OT Acute Rehabilitation Services Pager [phone] Office [phone]    Chancy Milroy 04/06/2020, 2:52 PM

## 2020-04-07 NOTE — Progress Notes (Signed)
Robert Kramer:[id number] DOB: Jul 14, 1975 DOA: 03/11/2020 PCP: Patient, No Pcp Per  Brief History   44 year old male with history of polysubstance use including IV drug use, bipolar disorder came to ED for evaluation of abnormal blood cultures. Patient was seen in [facility] on 03/06/2020 for evaluation management of heroin withdrawal. He was treated supportively. Blood cultures were obtained and grew MRSA and Serratia marcescens. He was called from home to come to ED for further evaluation. CTA chest PE showed multiple cavitary and noncavitary nodules consistent with septic emboli, bilateral lower lobe pneumonia and bilateral hilar adenopathy felt to be reactive. No pulmonary embolus was reported. Patient was started on IV ceftriaxone and vancomycin. TTE was performed which showed shaggy vegetations on tricuspid valve. TEE on 03/15/2020 showed 2 x 2 cm long vegetation on tricuspid valve with resultant severe TR. MRI left shoulder showed septic arthropathy, myositis and tenosynovitis. He underwent washout of left shoulder by orthopedic surgery on 03/15/2020. He was transferred to [facility] on 03/17/2020 for further evaluation of endocarditis by cardiothoracic surgery.  The patient underwent angiovac debridementon 03/21/2020. Infectious disease was consulted. They have recommended 4 weeks of IV Vancomycin and cefepime following the angiovac debridement to end on 04/18/2020.  Consultants  . CTS . Infectious Disease . Cardiology  Procedures  . TEE . Angiovac debridement . Left shoulder washout  Antibiotics   Anti-infectives (From admission, onward)   Start     Dose/Rate Route Frequency Ordered Stop   04/01/20 2000  vancomycin (VANCOREADY) IVPB 1500 mg/300 mL        1,500 mg 150 mL/hr over 120 Minutes Intravenous Every 12 hours 04/01/20 1140 04/23/20 0759   03/22/20 2000  vancomycin (VANCOREADY) IVPB 1250 mg/250 mL  Status:  Discontinued        1,250 mg 166.7 mL/hr over 90  Minutes Intravenous Every 12 hours 03/22/20 1435 04/01/20 1140   03/20/20 1400  ceFEPIme (MAXIPIME) 2 g in sodium chloride 0.9 % 100 mL IVPB        2 g 200 mL/hr over 30 Minutes Intravenous Every 8 hours 03/20/20 0912 04/22/20 2359   03/19/20 2200  vancomycin (VANCOCIN) IVPB 1000 mg/200 mL premix  Status:  Discontinued        1,000 mg 200 mL/hr over 60 Minutes Intravenous Every 12 hours 03/19/20 1258 03/22/20 1435   03/18/20 1400  cefTRIAXone (ROCEPHIN) 2 g in sodium chloride 0.9 % 100 mL IVPB  Status:  Discontinued        2 g 200 mL/hr over 30 Minutes Intravenous Every 24 hours 03/18/20 1105 03/20/20 0909   03/12/20 2000  cefTRIAXone (ROCEPHIN) 2 g in sodium chloride 0.9 % 100 mL IVPB  Status:  Discontinued        2 g 200 mL/hr over 30 Minutes Intravenous Every 24 hours 03/11/20 2058 03/12/20 0937   03/12/20 1400  ceFEPIme (MAXIPIME) 2 g in sodium chloride 0.9 % 100 mL IVPB  Status:  Discontinued        2 g 200 mL/hr over 30 Minutes Intravenous Every 8 hours 03/12/20 0937 03/18/20 1105   03/12/20 0000  vancomycin (VANCOCIN) IVPB 1000 mg/200 mL premix  Status:  Discontinued        1,000 mg 200 mL/hr over 60 Minutes Intravenous Every 8 hours 03/11/20 1816 03/19/20 1258   03/11/20 1830  cefTRIAXone (ROCEPHIN) 2 g in sodium chloride 0.9 % 100 mL IVPB        2 g 200 mL/hr over 30 Minutes  Intravenous  Once 03/11/20 1822 03/11/20 2030   03/11/20 1545  vancomycin (VANCOREADY) IVPB 1500 mg/300 mL        1,500 mg 150 mL/hr over 120 Minutes Intravenous  Once 03/11/20 1532 03/11/20 1819      Subjective  The patient is resting comfortably. No new complaints.  Objective   Vitals:  Vitals:   04/07/20 0334 04/07/20 1254  BP: 100/68 104/77  Pulse: 88 85  Resp:  16  Temp: 98 F (36.7 C) 98.1 F (36.7 C)  SpO2: 100% 100%   Exam:  Constitutional:  . The patient is awake, alert, and oriented x 3. No acute distress. Respiratory:  . No increased work of breathing. . No wheezes, rales, or  rhonchi . No tactile fremitus Cardiovascular:  . Regular rate and rhythm . No murmurs, ectopy, or gallups. . No lateral PMI. No thrills. Abdomen:  . Abdomen is soft, non-tender, non-distended . No hernias, masses, or organomegaly . Normoactive bowel sounds.  Musculoskeletal:  . No cyanosis, clubbing, or edema Skin:  . No rashes, lesions, ulcers . palpation of skin: no induration or nodules Neurologic:  . CN 2-12 intact . Sensation all 4 extremities intact Psychiatric:  . Mental status o Mood, affect appropriate o Orientation to person, place, time  . judgment and insight appear intact   I have personally reviewed the following:   Today's Data  . Vitals  Scheduled Meds: . buprenorphine-naloxone  1 tablet Sublingual BID  . Chlorhexidine Gluconate Cloth  6 each Topical Daily  . docusate sodium  100 mg Oral BID  . enoxaparin (LOVENOX) injection  40 mg Subcutaneous Q24H  . feeding supplement (ENSURE ENLIVE)  237 mL Oral TID BM  . ferrous sulfate  325 mg Oral TID WC  . multivitamin with minerals  1 tablet Oral Daily  . sodium chloride flush  10-40 mL Intracatheter Q12H   Continuous Infusions: . ceFEPime (MAXIPIME) IV 2 g (04/07/20 1459)  . vancomycin 1,500 mg (04/07/20 0857)    Principal Problem:   MRSA bacteremia Active Problems:   Polysubstance dependence (HCC)   Hypokalemia   Thrombocytopenia (HCC)   Serratia septicemia (HCC)   Bipolar 1 disorder (HCC)   Heroin abuse (HCC)   Arthritis, septic, shoulder (HCC)   Endocarditis of tricuspid valve   Hepatitis C virus infection   IVDU (intravenous drug user)   Normocytic anemia   Cigarette smoker   Septic pulmonary embolism (HCC)   Pressure injury of skin   Lumbar back pain   LOS: 27 days   A & P  Severe sepsis secondary to MRSA and Serratia bacteremia-pulmonary septic emboli, TV endocarditis, left shoulder septic arthritis: Patient is status post left shoulder arthroscopy with debridement irrigation for  septic arthritis as per orthopedics on 03/15/2020. Echocardiogram showed tricuspid vegetation. TEE showed 2x2 mobile vegetation with severe TR. MRI lumbar spine showed no evidence of epidural abscess. Patient refused MRI right shoulder ordered by ID. CT surgery was consulted - s/p angiovac debridement on 03/21/2020.  Infectious disease following. Continue IV vancomycin, Rocephin was changed to cefepime per ID. Repeat blood cultures drawn on 9/8 negative. ID recommended 4 more weeks of IV antibiotic therapy starting from 9/16/2021with vancomycin and cefepime until 04/18/2020.  Rec to callID back prior to discharge to reassess whether he requires outpatient regimen.  TV endocarditis-s/p Angiovac: CT surgery following.  Insomnia: 9/30 begin as needed Benadryl at HS  Hyponatremia: The patient's sodium is stable at 132. On 04/04/2020. Monitor.  Substance use disorder: Ongoing  IV heroin use, last use was 3 days prior to admission. Patient started on Suboxone 8/2 mg twice daily with as needed Suboxone per opiate withdrawal protocol. Spoke with Dr. Erlinda Hong with Suboxone clinic who agreed patient is an appropriate candidate.  He requested that patient be given number to contact [facility] clinic arrange for outpatient follow-up; patient has completed this request. OUD intake assessment completed by telephone. Patient has been scheduled for 04/24/2020 at 9:45 with Dr. Nedra Hai. HIV antibodies were nonreactive Hepatitis C antibodies were reactive. Hepatitis C genotype 1b. Patient can follow-up with GI as outpatient for hepatitis C.  Thrombocytopenia: Resolved. Likely due to sepsis.   Iron deficiency anemia: Hemoglobinstable at 8.6. Patient's hemoglobin dropped after surgery, and was transfused 1 unit PRBC. Anemia panel obtained on 9/15 showed iron 25, saturation 13%, TIBC 195, B12 440, reticulocyte count percent 6.1, immature fraction 27.8. 9/22 initiated iron TID with twice daily  scheduled Colace to prevent iron-induced constipation. Haptoglobin is normal at 202.  I have seen and examined this patient myself. I have spent 32 minutes in his evaluation and care.  Kishaun Erekson, DO [facility] Direct contact: see [email or web address]  7PM-7AM contact night coverage as above 04/07/2020, 6:35 PM  LOS: 27 days

## 2020-04-07 NOTE — Progress Notes (Addendum)
PROGRESS NOTE  Robert Kramer YQM:[id number] DOB: [birth year] DOA: 03/11/2020 PCP: Patient, No Pcp Per  Brief History   44 year old male with history of polysubstance use including IV drug use, bipolar disorder came to ED for evaluation of abnormal blood cultures. Patient was seen in [facility] on 03/06/2020 for evaluation management of heroin withdrawal. He was treated supportively. Blood cultures were obtained and grew MRSA and Serratia marcescens. He was called from home to come to ED for further evaluation. CTA chest PE showed multiple cavitary and noncavitary nodules consistent with septic emboli, bilateral lower lobe pneumonia and bilateral hilar adenopathy felt to be reactive. No pulmonary embolus was reported. Patient was started on IV ceftriaxone and vancomycin. TTE was performed which showed shaggy vegetations on tricuspid valve. TEE on 03/15/2020 showed 2 x 2 cm long vegetation on tricuspid valve with resultant severe TR. MRI left shoulder showed septic arthropathy, myositis and tenosynovitis. He underwent washout of left shoulder by orthopedic surgery on 03/15/2020. He was transferred to [facility] on 03/17/2020 for further evaluation of endocarditis by cardiothoracic surgery.  The patient underwent angiovac debridementon 03/21/2020. Infectious disease was consulted. They have recommended 4 weeks of IV Vancomycin and cefepime following the angiovac debridement to end on 04/18/2020.  Consultants  . CTS . Infectious Disease . Cardiology  Procedures  . TEE . Angiovac debridement . Left shoulder washout  Antibiotics   Anti-infectives (From admission, onward)   Start     Dose/Rate Route Frequency Ordered Stop   04/01/20 2000  vancomycin (VANCOREADY) IVPB 1500 mg/300 mL        1,500 mg 150 mL/hr over 120 Minutes Intravenous Every 12 hours 04/01/20 1140 04/23/20 0759   03/22/20 2000  vancomycin (VANCOREADY) IVPB 1250 mg/250 mL  Status:  Discontinued        1,250 mg 166.7 mL/hr over 90  Minutes Intravenous Every 12 hours 03/22/20 1435 04/01/20 1140   03/20/20 1400  ceFEPIme (MAXIPIME) 2 g in sodium chloride 0.9 % 100 mL IVPB        2 g 200 mL/hr over 30 Minutes Intravenous Every 8 hours 03/20/20 0912 04/22/20 2359   03/19/20 2200  vancomycin (VANCOCIN) IVPB 1000 mg/200 mL premix  Status:  Discontinued        1,000 mg 200 mL/hr over 60 Minutes Intravenous Every 12 hours 03/19/20 1258 03/22/20 1435   03/18/20 1400  cefTRIAXone (ROCEPHIN) 2 g in sodium chloride 0.9 % 100 mL IVPB  Status:  Discontinued        2 g 200 mL/hr over 30 Minutes Intravenous Every 24 hours 03/18/20 1105 03/20/20 0909   03/12/20 2000  cefTRIAXone (ROCEPHIN) 2 g in sodium chloride 0.9 % 100 mL IVPB  Status:  Discontinued        2 g 200 mL/hr over 30 Minutes Intravenous Every 24 hours 03/11/20 2058 03/12/20 0937   03/12/20 1400  ceFEPIme (MAXIPIME) 2 g in sodium chloride 0.9 % 100 mL IVPB  Status:  Discontinued        2 g 200 mL/hr over 30 Minutes Intravenous Every 8 hours 03/12/20 0937 03/18/20 1105   03/12/20 0000  vancomycin (VANCOCIN) IVPB 1000 mg/200 mL premix  Status:  Discontinued        1,000 mg 200 mL/hr over 60 Minutes Intravenous Every 8 hours 03/11/20 1816 03/19/20 1258   03/11/20 1830  cefTRIAXone (ROCEPHIN) 2 g in sodium chloride 0.9 % 100 mL IVPB        2 g 200 mL/hr over 30 Minutes  Intravenous  Once 03/11/20 1822 03/11/20 2030   03/11/20 1545  vancomycin (VANCOREADY) IVPB 1500 mg/300 mL        1,500 mg 150 mL/hr over 120 Minutes Intravenous  Once 03/11/20 1532 03/11/20 1819    .   Subjective  The patient is resting comfortably. No new complaints.  Objective   Vitals:  Vitals:   04/06/20 2345 04/07/20 0334  BP: 112/66 100/68  Pulse: 88 88  Resp: 20   Temp: 98.7 F (37.1 C) 98 F (36.7 C)  SpO2: 100% 100%   Exam:  Constitutional:  . The patient is awake, alert, and oriented x 3. No acute distress. Respiratory:  . No increased work of breathing. . No wheezes, rales,  or rhonchi . No tactile fremitus Cardiovascular:  . Regular rate and rhythm . No murmurs, ectopy, or gallups. . No lateral PMI. No thrills. Abdomen:  . Abdomen is soft, non-tender, non-distended . No hernias, masses, or organomegaly . Normoactive bowel sounds.  Musculoskeletal:  . No cyanosis, clubbing, or edema Skin:  . No rashes, lesions, ulcers . palpation of skin: no induration or nodules Neurologic:  . CN 2-12 intact . Sensation all 4 extremities intact Psychiatric:  . Mental status o Mood, affect appropriate o Orientation to person, place, time  . judgment and insight appear intact   I have personally reviewed the following:   Today's Data  . Vitals  Scheduled Meds: . buprenorphine-naloxone  1 tablet Sublingual BID  . Chlorhexidine Gluconate Cloth  6 each Topical Daily  . docusate sodium  100 mg Oral BID  . enoxaparin (LOVENOX) injection  40 mg Subcutaneous Q24H  . feeding supplement (ENSURE ENLIVE)  237 mL Oral TID BM  . ferrous sulfate  325 mg Oral TID WC  . multivitamin with minerals  1 tablet Oral Daily  . sodium chloride flush  10-40 mL Intracatheter Q12H   Continuous Infusions: . ceFEPime (MAXIPIME) IV 2 g (04/07/20 0627)  . vancomycin 1,500 mg (04/06/20 2029)    Principal Problem:   MRSA bacteremia Active Problems:   Polysubstance dependence (HCC)   Hypokalemia   Thrombocytopenia (HCC)   Serratia septicemia (HCC)   Bipolar 1 disorder (HCC)   Heroin abuse (HCC)   Arthritis, septic, shoulder (HCC)   Endocarditis of tricuspid valve   Hepatitis C virus infection   IVDU (intravenous drug user)   Normocytic anemia   Cigarette smoker   Septic pulmonary embolism (HCC)   Pressure injury of skin   Lumbar back pain   LOS: 27 days   A & P  Severe sepsis secondary to MRSA and Serratia bacteremia-pulmonary septic emboli, TV endocarditis, left shoulder septic arthritis: Patient is status post left shoulder arthroscopy with debridement irrigation for  septic arthritis as per orthopedics on 03/15/2020. Echocardiogram showed tricuspid vegetation. TEE showed 2x2 mobile vegetation with severe TR. MRI lumbar spine showed no evidence of epidural abscess. Patient refused MRI right shoulder ordered by ID. CT surgery was consulted - s/p angiovac debridement on 03/21/2020.  Infectious disease following. Continue IV vancomycin, Rocephin was changed to cefepime per ID. Repeat blood cultures drawn on 9/8 negative. ID recommended 4 more weeks of IV antibiotic therapy starting from 9/16/2021with vancomycin and cefepime until 04/18/2020.  Rec to callID back prior to discharge to reassess whether he requires outpatient regimen.  TV endocarditis-s/p Angiovac: CT surgery following.  Insomnia: 9/30 begin as needed Benadryl at HS  Hyponatremia: The patient's sodium is stable at 132. On 04/04/2020. Monitor.  Substance use disorder:  Ongoing IV heroin use, last use was 3 days prior to admission. Patient started on Suboxone 8/2 mg twice daily with as needed Suboxone per opiate withdrawal protocol. Spoke with Dr. Erlinda Hong with Suboxone clinic who agreed patient is an appropriate candidate.  He requested that patient be given number to contact [facility] Sub[facility] clinic arrange for outpatient follow-up; patient has completed this request. OUD intake assessment completed by telephone. Patient has been scheduled for 04/24/2020 at 9:45 with Dr. Nedra Hai. HIV antibodies were nonreactive Hepatitis C antibodies were reactive. Hepatitis C genotype 1b. Patient can follow-up with GI as outpatient for hepatitis C.  Thrombocytopenia: Resolved. Likely due to sepsis.   Iron deficiency anemia: Hemoglobinstable at 8.6. Patient's hemoglobin dropped after surgery, and was transfused 1 unit PRBC. Anemia panel obtained on 9/15 showed iron 25, saturation 13%, TIBC 195, B12 440, reticulocyte count percent 6.1, immature fraction 27.8. 9/22 initiated iron TID with twice daily  scheduled Colace to prevent iron-induced constipation. Haptoglobin is normal at 202.  I have seen and examined this patient myself. I have spent 32 minutes in his evaluation and care.  Brookelyn Gaynor, DO [facility] Direct contact: see [email or web address]  7PM-7AM contact night coverage as above 04/06/2020, 20:41 PM  LOS: 27 days

## 2020-04-07 NOTE — Progress Notes (Signed)
Pharmacy Antibiotic Note  Robert Kramer is a 44 y.o. 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 Angiovac procedure completed 9/16. Additionally, patient has L shoulder septic arthritis s/p I&D 9/10 with culture growing rare MRSA. Pharmacy has been consulted for cefepime dosing for Serratia bacteremia/endocarditis (given ampC organism).  9/30 vancomycin trough came back therapeutic at 19 on 1500 mg IV every 12 hours. Scr 0.62 (CrCl 92 mL/min), afebrile. WBC WNL.   No new labs, still afebrile. Scr ordered for tomorrow.   Plan: Continue cefepime 2g q8 hours Continue vancomycin 1500mg  IV q12h Consider a vancomycin level later weekly or if Scr changes   Height: 5\' 11"  (180.3 cm) Weight: 57.8 kg (127 lb 6.8 oz) IBW/kg (Calculated) : 75.3  Temp (24hrs), Avg:98.3 F (36.8 C), Min:98 F (36.7 C), Max:98.7 F (37.1 C)  Recent Labs  Lab 04/01/20 0730 04/04/20 0808  WBC  --  8.1  CREATININE 0.55* 0.62  VANCOTROUGH 14* 19    Estimated Creatinine Clearance: 94.3 mL/min (by C-G formula based on SCr of 0.62 mg/dL).    Allergies  Allergen Reactions  . Tramadol Other (See Comments)    Upset stomach   Antimicrobials this admission: Vancomycin 9/6 >>  Ceftriaxone 9/6 >>9/7, 9/13>>9/15 Cefepime 9/7 >>9/13, 9/15>>  Microbiology results: 9/1 BCx x1: MRSA 9/1 BCx x1: Serratia Marcescens (S= Bactrim, Cipro; R=Cefazolin) 9/6 UCx: mult sp F 9/6 BCx: MRSA F 9/6 COVID: neg 9/8 Hep A reactive 9/8 Hep B NR 9/8 Hep C reactive 9/8 BCx2 ngtd 9/10 MRSA PCR neg  9/10 L shoulder synovial fluid: rare MRSA 9/16 tissue/veg: MRSA  Thank you for allowing pharmacy to be a part of this patient's care.  11/8, PharmD PGY1 Pharmacy Resident 04/07/2020 1:47 PM  Please check [email or web address] for unit-specific pharmacy phone numbers.

## 2020-04-08 ENCOUNTER — Inpatient Hospital Stay (HOSPITAL_COMMUNITY): Payer: Self-pay

## 2020-04-08 MED ORDER — ALTEPLASE 2 MG IJ SOLR
2.0000 mg | Freq: Once | INTRAMUSCULAR | Status: AC
  Administered 2020-04-08: 2 mg
  Filled 2020-04-08: qty 2

## 2020-04-08 NOTE — Progress Notes (Signed)
TPA dose given not successful. Dr. Gerri Lins made aware and ordered a chest x-ray to verify placement and then second dose of TPA. Orders placed. RN made aware.

## 2020-04-08 NOTE — Progress Notes (Signed)
Assessed PICC line per request. Line flushes, however cap changed and still no blood return. RN aware will need TPA, however patient is due to get vancomycin at this time. TPA ordered and RN to place consult when antibiotic is finished to place tpa.

## 2020-04-08 NOTE — Progress Notes (Signed)
[facility] PROGRESS NOTE  Robert Kramer UVO:[id number] DOB: 1976-02-15 DOA: 03/11/2020 PCP: Patient, No Pcp Per  Status: Inpatient--Remains inpatient appropriate because:Unsafe d/c plan and IV treatments appropriate due to intensity of illness or inability to take PO   Dispo: The patient is from: Home              Anticipated d/c is to: Home              Anticipated d/c date is: > 3 days              Patient currently is not medically stable to d/c.  Needs to complete a full course of IV antibiotics to treat bacteremia and endocarditis.  Patient is an IV drug abuser and it is unsafe for patient to discharge outside of hospital environment with PICC line in place.  9/28 face-to-face completed regarding home health RN, PT and social work-as of 10/1 patient ready refused home health services including PT.  He is agreeable to receipt of a rolling walker.   Code Status: Full Family Communication: Patient only DVT prophylaxis: Lovenox Vaccination status: ?  Has been given [employer] Covid vaccine prior to current admission; was last immunized against influenza in 2018 and did receive pneumococcal vaccine September 2021  HPI: 44 year old male with history of polysubstance use including IV drug use, bipolar disorder came to ED for evaluation of abnormal blood cultures. Patient was seen in [facility] on 03/06/2020 for evaluation management of heroin withdrawal.  He was treated supportively.  Blood cultures were obtained and grew MRSA and Serratia marcescens.  He was called from home to come to ED for further evaluation.  CTA chest PE showed multiple cavitary and noncavitary nodules consistent with septic emboli, bilateral lower lobe pneumonia and bilateral hilar adenopathy felt to be reactive.  No pulmonary embolus was reported.  Patient was started on IV ceftriaxone and vancomycin.  TTE was performed which showed shaggy vegetations on tricuspid valve.  TEE on 03/15/2020 showed 2 x 2 cm long  vegetation on tricuspid valve with resultant severe TR.  MRI left shoulder showed septic arthropathy, myositis and tenosynovitis.  He underwent washout of left shoulder by orthopedic surgery on 03/15/2020.  He was transferred to [facility] on 03/17/2020 for further evaluation of endocarditis by cardiothoracic surgery.  Subjective:  Sleeping soundly.  Awakened.  Patient reports that he did not sleep well last night.  When asked if he took the Benadryl as prescribed he stated no.  Objective: Vitals:   04/08/20 0800 04/08/20 0849  BP:  91/76  Pulse:  80  Resp: 11 14  Temp:  98.2 F (36.8 C)  SpO2:  98%    Intake/Output Summary (Last 24 hours) at 04/08/2020 1239 Last data filed at 04/08/2020 1046 Gross per 24 hour  Intake 980 ml  Output 2750 ml  Net -1770 ml   Filed Weights   04/06/20 0554 04/07/20 0334 04/08/20 0451  Weight: 56.9 kg 57.8 kg 58.8 kg    Exam:  Constitutional: NAD, calm, awake and and appears sleepy Respiratory: clear to auscultation bilaterally, no wheezing, no crackles. Normal respiratory effort.RA  Cardiovascular: Regular rate and rhythm, no murmurs / rubs / gallops. No extremity edema. 2+ pedal pulses. No carotid bruits.  Abdomen: no tenderness, no masses palpated.  Tolerating solid diet.  Bowel sounds positive.  Musculoskeletal: no clubbing / cyanosis. No joint deformity upper and lower extremities. Normal muscle tone.  Neurologic: CN 2-12 grossly intact. Sensation intact, DTR normal. Strength  5/5 x all 4 extremities.  Psychiatric: Normal judgment and insight. Alert and oriented x 3. Normal mood.    Assessment/Plan: Severe sepsis secondary to MRSA and Serratia bacteremia-pulmonary septic emboli, TV endocarditis, left shoulder septic arthritis.   Patient is status post left shoulder arthroscopy with debridement irrigation for septic arthritis as per orthopedics on 03/15/2020.  Echocardiogram showed tricuspid vegetation.  TEE showed 2x2 mobile vegetation with severe  TR.   MRI lumbar spine showed no evidence of epidural abscess.  Patient refused MRI right shoulder ordered by ID.   CT surgery was consulted - s/p angiovac debridement on 03/21/2020.  Infectious disease following.  Continue IV vancomycin, Rocephin was changed to cefepime per ID. Repeat blood cultures drawn on 9/8 negative.  ID recommended 4 more weeks of IV antibiotic therapy starting from 03/21/2020 with vancomycin and cefepime until 04/18/2020.  Rec to call ID back prior to discharge to reassess whether he requires outpatient regimen.  TV endocarditis-s/p Angiovac   CT surgery following.  Insomnia 9/30 begin as needed Benadryl at HS  Hyponatremia sodium is stable at 133 as of 9/23 Has been low since last month.   Serum osmolality is 281.  Substance use disorder Ongoing IV heroin use, last use was 3 days prior to admission.   Patient started on Suboxone 8/2 mg twice daily with as needed Suboxone per opiate withdrawal protocol.   Poke with Dr. Erlinda Honguncan Vincent with Suboxone clinic who agreed patient is an appropriate candidate.  He requested that patient be given number to contact [facility] arrange for outpatient follow-up; patient has completed this request. OUD intake assessment completed by telephone. Patient has been scheduled for 04/24/2020 at 9:45 with Dr. Nedra HaiLee.  HIV antibodies were nonreactive Hepatitis C antibodies were reactive. Hepatitis C genotype 1b.  Patient can follow-up with GI as outpatient for hepatitis C.  Thrombocytopenia Likely due to sepsis.   Platelet count has improved to 136,000  Iron deficiency anemia Hemoglobin stable at 8.4  Patient's hemoglobin dropped after surgery, and was transfused 1 unit PRBC.    Anemia panel obtained on 9/15 showed iron 25, saturation 13%, TIBC 195, B12 440, reticulocyte count percent 6.1, immature fraction 27.8.   9/22 initiated iron TID with twice daily scheduled Colace to prevent iron-induced constipation Haptoglobin is  normal at 202 10/1 will repeat anemia panel and if iron remains significantly low would likely benefit from IV iron    Data Reviewed: Basic Metabolic Panel: Recent Labs  Lab 04/04/20 0808  NA 132*  K 4.2  CL 95*  CO2 30  GLUCOSE 103*  BUN 17  CREATININE 0.62  CALCIUM 9.2   Liver Function Tests: No results for input(s): AST, ALT, ALKPHOS, BILITOT, PROT, ALBUMIN in the last 168 hours. No results for input(s): LIPASE, AMYLASE in the last 168 hours. No results for input(s): AMMONIA in the last 168 hours. CBC: Recent Labs  Lab 04/04/20 0808  WBC 8.1  HGB 8.6*  HCT 28.5*  MCV 90.8  PLT 150   Cardiac Enzymes: No results for input(s): CKTOTAL, CKMB, CKMBINDEX, TROPONINI in the last 168 hours. BNP (last 3 results) No results for input(s): BNP in the last 8760 hours.  ProBNP (last 3 results) No results for input(s): PROBNP in the last 8760 hours.  CBG: No results for input(s): GLUCAP in the last 168 hours.  No results found for this or any previous visit (from the past 240 hour(s)).   Studies: No results found.  Scheduled Meds: . buprenorphine-naloxone  1 tablet Sublingual BID  . Chlorhexidine Gluconate Cloth  6 each Topical Daily  . docusate sodium  100 mg Oral BID  . enoxaparin (LOVENOX) injection  40 mg Subcutaneous Q24H  . feeding supplement (ENSURE ENLIVE)  237 mL Oral TID BM  . ferrous sulfate  325 mg Oral TID WC  . multivitamin with minerals  1 tablet Oral Daily  . sodium chloride flush  10-40 mL Intracatheter Q12H   Continuous Infusions: . ceFEPime (MAXIPIME) IV 2 g (04/08/20 0612)  . vancomycin 1,500 mg (04/08/20 0910)    Principal Problem:   MRSA bacteremia Active Problems:   Polysubstance dependence (HCC)   Hypokalemia   Thrombocytopenia (HCC)   Serratia septicemia (HCC)   Bipolar 1 disorder (HCC)   Heroin abuse (HCC)   Arthritis, septic, shoulder (HCC)   Endocarditis of tricuspid valve   Hepatitis C virus infection   IVDU (intravenous drug  user)   Normocytic anemia   Cigarette smoker   Septic pulmonary embolism (HCC)   Pressure injury of skin   Lumbar back pain   Consultants:  Orthopedics  CVTS  Infectious disease  Procedures: 9/7 2D echocardiogram: 1. Left ventricular ejection fraction, by estimation, is 60 to 65%. The left ventricle has normal function. The left ventricle has no regional wall motion abnormalities. Left ventricular diastolic function could not be evaluated. 2. Right ventricular systolic function is normal. The right ventricular size is normal. There is normal pulmonary artery systolic pressure. The estimated right ventricular systolic pressure is 31.5 mmHg. 3. The mitral valve is normal in structure. No evidence of mitral valve regurgitation. No evidence of mitral stenosis. 4. There are several large shaggy mobile densities one of which measures 1.27 x 1.54cm on the TV worrisome for vegetations. The tricuspid valve is abnormal. 5. The aortic valve is normal in structure. Aortic valve regurgitation is not visualized. No aortic stenosis is present. 6. The inferior vena cava is normal in size with greater than 50% respiratory variability, suggesting right atrial pressure of 3 mmHg.  9/10 TEE: 1. Tricuspid valve endocarditis. 2. Left ventricular ejection fraction, by estimation, is 60 to 65%. The left ventricle has normal function. 3. Right ventricular systolic function is normal. The right ventricular size is normal. 4. No left atrial/left atrial appendage thrombus was detected. 5. Right atrial size was mildly dilated. 6. A small pericardial effusion is present. The pericardial effusion is circumferential. There is no evidence of cardiac tamponade. 7. The mitral valve is normal in structure. Trivial mitral valve regurgitation. 8. There are 2 separate elongated valvular vegetations along the medial and lateral tricuspid valve leaflets that are approximately 2 cm in length, residing mostly in the right  atrial surface. There is associated severe tricuspid regurgitation.. The tricuspid valve is abnormal. Tricuspid valve regurgitation is severe. 9. The aortic valve is normal in structure. Aortic valve regurgitation is not visualized. 10. There are very small shaggy thin mobile echodensities on the pulmonic valve surface. Trivial pulmonic valve regurgitation. This may represent pulmonic  9/10 left shoulder arthroscopy with extensive debridement, I/D, excisional debridement of the labrum and bursa as well as left shoulder subacromial bursectomy  9/16 application of angio VAC with intraoperative TEE: Right heart cannulation - Debridement of right atrial mass - Debridement of tricuspid valve vegetation  Antibiotics: Anti-infectives (From admission, onward)   Start     Dose/Rate Route Frequency Ordered Stop   04/01/20 2000  vancomycin (VANCOREADY) IVPB 1500 mg/300 mL        1,500 mg 150  mL/hr over 120 Minutes Intravenous Every 12 hours 04/01/20 1140 04/23/20 0759   03/22/20 2000  vancomycin (VANCOREADY) IVPB 1250 mg/250 mL  Status:  Discontinued        1,250 mg 166.7 mL/hr over 90 Minutes Intravenous Every 12 hours 03/22/20 1435 04/01/20 1140   03/20/20 1400  ceFEPIme (MAXIPIME) 2 g in sodium chloride 0.9 % 100 mL IVPB        2 g 200 mL/hr over 30 Minutes Intravenous Every 8 hours 03/20/20 0912 04/22/20 2359   03/19/20 2200  vancomycin (VANCOCIN) IVPB 1000 mg/200 mL premix  Status:  Discontinued        1,000 mg 200 mL/hr over 60 Minutes Intravenous Every 12 hours 03/19/20 1258 03/22/20 1435   03/18/20 1400  cefTRIAXone (ROCEPHIN) 2 g in sodium chloride 0.9 % 100 mL IVPB  Status:  Discontinued        2 g 200 mL/hr over 30 Minutes Intravenous Every 24 hours 03/18/20 1105 03/20/20 0909   03/12/20 2000  cefTRIAXone (ROCEPHIN) 2 g in sodium chloride 0.9 % 100 mL IVPB  Status:  Discontinued        2 g 200 mL/hr over 30 Minutes Intravenous Every 24 hours 03/11/20 2058 03/12/20 0937   03/12/20  1400  ceFEPIme (MAXIPIME) 2 g in sodium chloride 0.9 % 100 mL IVPB  Status:  Discontinued        2 g 200 mL/hr over 30 Minutes Intravenous Every 8 hours 03/12/20 0937 03/18/20 1105   03/12/20 0000  vancomycin (VANCOCIN) IVPB 1000 mg/200 mL premix  Status:  Discontinued        1,000 mg 200 mL/hr over 60 Minutes Intravenous Every 8 hours 03/11/20 1816 03/19/20 1258   03/11/20 1830  cefTRIAXone (ROCEPHIN) 2 g in sodium chloride 0.9 % 100 mL IVPB        2 g 200 mL/hr over 30 Minutes Intravenous  Once 03/11/20 1822 03/11/20 2030   03/11/20 1545  vancomycin (VANCOREADY) IVPB 1500 mg/300 mL        1,500 mg 150 mL/hr over 120 Minutes Intravenous  Once 03/11/20 1532 03/11/20 1819       Time spent: 20 minutes    Junious Silk ANP  [facility] Pager [phone]. If 7PM-7AM, please contact night-coverage at [email or web address] 04/08/2020, 12:39 PM  LOS: 28 days

## 2020-04-08 NOTE — Progress Notes (Signed)
Physical Therapy Treatment Patient Details Name: Robert Kramer MRN: [id number] DOB: 05-24-1976 Today's Date: 04/08/2020    History of Present Illness Pt is a 44 y.o. male admitted 03/11/20 for evaluation of abnormal blood cultures. Chest CTA shows multiple cavitary and noncavitary nodules consistent with septic emboli; bilateral PNA, bilateral hilar adenopathy. 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 Pt with L shoulder septic arthritis s/p I&D 9/10. S/p catheter-based debridement of tricuspid valve 9/16. PMH includes IVDA, bipolar disorder.  Underwent AngioVac procedure 9/16.    PT Comments    Pt agreeable to ambulation; pt able to ambulate with RW with S; pt with increased reliance on UEs during ambulation; pt progressed to balance exercises wtihout RW, pt requiring min guard to maintain balance; pt fatigued quickly performing standing activities without RW; pt continues to demonstrate deficits in balance, strength, gait and endurance and will benefit from skilled PT to address deficits to maximize independence with functional mobility prior to discharge.     Follow Up Recommendations  Home health PT;Supervision/Assistance - 24 hour     Equipment Recommendations  Rolling walker with 5" wheels    Recommendations for Other Services       Precautions / Restrictions Precautions Precautions: Fall    Mobility  Bed Mobility Overal bed mobility: Independent                Transfers Overall transfer level: Needs assistance   Transfers: Sit to/from Stand Sit to Stand: Supervision         General transfer comment: S due to LOB when fatigued requiring use of bedrails to regain balance  Ambulation/Gait Ambulation/Gait assistance: Supervision Gait Distance (Feet): 150 Feet Assistive device: Rolling walker (2 wheeled) Gait Pattern/deviations: Step-through pattern Gait velocity: Decreased   General Gait Details: increased weight bearing through RW,  forward flexed posture   Stairs             Wheelchair Mobility    Modified Rankin (Stroke Patients Only)       Balance               Standing balance comment: performed standing marches with therapist providing min guard to maintain balance, performed 3 trials of 10 marches                            Cognition                                              Exercises      General Comments General comments (skin integrity, edema, etc.): VSS      Pertinent Vitals/Pain Pain Location: pt states he has soreness and stiffness in neck, unable to give a number    Home Living                      Prior Function            PT Goals (current goals can now be found in the care plan section) Acute Rehab PT Goals Patient Stated Goal: Continue to get my arm better. PT Goal Formulation: With patient Time For Goal Achievement: 04/15/20 Potential to Achieve Goals: Good Progress towards PT goals: Progressing toward goals    Frequency    Min 3X/week      PT Plan Current plan remains appropriate  Co-evaluation              AM-PAC PT "6 Clicks" Mobility   Outcome Measure  Help needed turning from your back to your side while in a flat bed without using bedrails?: None Help needed moving from lying on your back to sitting on the side of a flat bed without using bedrails?: None Help needed moving to and from a bed to a chair (including a wheelchair)?: None Help needed standing up from a chair using your arms (e.g., wheelchair or bedside chair)?: A Little Help needed to walk in hospital room?: A Little Help needed climbing 3-5 steps with a railing? : A Little 6 Click Score: 21    End of Session Equipment Utilized During Treatment: Gait belt Activity Tolerance: Patient tolerated treatment well Patient left: in bed;with call bell/phone within reach Nurse Communication: Mobility status PT Visit Diagnosis: Difficulty  in walking, not elsewhere classified (R26.2);Other abnormalities of gait and mobility (R26.89)     Time: 6283-1517 PT Time Calculation (min) (ACUTE ONLY): 9 min  Charges:  $Gait Training: 8-22 mins                     Ginette Otto, DPT Acute Rehabilitation Services [phone]   Lucretia Field 04/08/2020, 1:38 PM

## 2020-04-09 LAB — CREATININE, SERUM
Creatinine, Ser: 0.61 mg/dL (ref 0.61–1.24)
GFR calc Af Amer: >60 mL/min (ref >60)
GFR calc non Af Amer: >60 mL/min (ref >60)

## 2020-04-09 NOTE — Progress Notes (Signed)
[facility] PROGRESS NOTE  Robert KluverJames E Kramer ONG:[id number] DOB: Feb 01, 1976 DOA: 03/11/2020 PCP: Robert Kramer, No Pcp Per  Status: Inpatient--Remains inpatient appropriate because:Unsafe d/c plan and IV treatments appropriate due to intensity of illness or inability to take PO   Dispo: The Robert Kramer is from: Home              Anticipated d/c is to: Home              Anticipated d/c date is: > 3 days              Robert Kramer currently is not medically stable to d/c.  Needs to complete a full course of IV antibiotics to treat bacteremia and endocarditis.  Robert Kramer is an IV drug abuser and it is unsafe for Robert Kramer to discharge outside of hospital environment with PICC line in place.  9/28 face-to-face completed regarding home health RN, PT and social work-as of 10/1 Robert Kramer ready refused home health services including PT.  He is agreeable to receipt of a rolling walker.   Code Status: Full Family Communication: Robert Kramer only DVT prophylaxis: Lovenox Vaccination status: ?  Has been given Robert Kramer & Robert Kramer Covid vaccine prior to current admission; was last immunized against influenza in 2018 and did receive pneumococcal vaccine September 2021  HPI: 44 year old male with history of polysubstance use including IV drug use, bipolar disorder came to ED for evaluation of abnormal blood cultures. Robert Kramer was seen in [facility] on 03/06/2020 for evaluation management of heroin withdrawal.  He was treated supportively.  Blood cultures were obtained and grew MRSA and Serratia marcescens.  He was called from home to come to ED for further evaluation.  CTA chest PE showed multiple cavitary and noncavitary nodules consistent with septic emboli, bilateral lower lobe pneumonia and bilateral hilar adenopathy felt to be reactive.  No pulmonary embolus was reported.  Robert Kramer was started on IV ceftriaxone and vancomycin.  TTE was performed which showed shaggy vegetations on tricuspid valve.  TEE on 03/15/2020 showed 2 x 2 cm long  vegetation on tricuspid valve with resultant severe TR.  MRI left shoulder showed septic arthropathy, myositis and tenosynovitis.  He underwent washout of left shoulder by orthopedic surgery on 03/15/2020.  He was transferred to [facility] on 03/17/2020 for further evaluation of endocarditis by cardiothoracic surgery.  Subjective:  Again reports difficulty sleeping and again did not take prescribed as needed Benadryl.  Objective: Vitals:   04/08/20 2100 04/09/20 0500  BP: 104/73 101/74  Pulse: 87 77  Resp: 17 14  Temp: 99.7 F (37.6 C) 98.2 F (36.8 C)  SpO2: 99% 96%    Intake/Output Summary (Last 24 hours) at 04/09/2020 1143 Last data filed at 04/08/2020 1700 Gross per 24 hour  Intake --  Output 200 ml  Net -200 ml   Filed Weights   04/06/20 0554 04/07/20 0334 04/08/20 0451  Weight: 56.9 kg 57.8 kg 58.8 kg    Exam:  Constitutional: NAD, calm, awake and and appears sleepy Respiratory: clear to auscultation bilaterally, Normal respiratory effort. RA  Cardiovascular: Regular rate and rhythm, no murmurs / rubs / gallops. No extremity edema. 2+ pedal pulses. No carotid bruits.  Abdomen: no tenderness, no masses palpated.  Tolerating solid diet.  Bowel sounds positive.  Musculoskeletal: no clubbing / cyanosis. No joint deformity upper and lower extremities. Normal muscle tone.  Neurologic: CN 2-12 grossly intact. Sensation intact, DTR normal. Strength 5/5 x all 4 extremities.  Psychiatric: Normal judgment and insight. Alert and oriented x 3.  Normal mood.    Assessment/Plan: Severe sepsis secondary to MRSA and Serratia bacteremia-pulmonary septic emboli, TV endocarditis, left shoulder septic arthritis.   Robert Kramer is status post left shoulder arthroscopy with debridement irrigation for septic arthritis as per orthopedics on 03/15/2020.  Echocardiogram showed tricuspid vegetation.  TEE showed 2x2 mobile vegetation with severe TR.   MRI lumbar spine showed no evidence of epidural abscess.   Robert Kramer refused MRI right shoulder ordered by ID.   CT surgery was consulted - s/p angiovac debridement on 03/21/2020.  Infectious disease following.  Continue IV vancomycin, Rocephin was changed to cefepime per ID. Repeat blood cultures drawn on 9/8 negative.  ID recommended 4 more weeks of IV antibiotic therapy starting from 03/21/2020 with vancomycin and cefepime until 04/18/2020.  Rec to call ID back prior to discharge to reassess whether he requires outpatient regimen. 10/4 became occluded and required the lytics to resolve  TV endocarditis-s/p Angiovac   CT surgery following prn  Insomnia 9/30 begin as needed Benadryl at HS  Hyponatremia sodium is stable at 133 as of 9/23 Has been low since last month.   Serum osmolality is 281.  Substance use disorder Ongoing IV heroin use, last use was 3 days prior to admission.   Robert Kramer started on Suboxone 8/2 mg twice daily with as needed Suboxone per opiate withdrawal protocol.   Poke with Dr. Erlinda Hong with Suboxone clinic who agreed Robert Kramer is an appropriate candidate.  He requested that Robert Kramer be given number to contact [facility] arrange for outpatient follow-up; Robert Kramer has completed this request. OUD intake assessment completed by telephone. Robert Kramer has been scheduled for 04/24/2020 at 9:45 with Dr. Nedra Hai.  HIV antibodies were nonreactive Hepatitis C antibodies were reactive. Hepatitis C genotype 1b.  Robert Kramer can follow-up with GI as outpatient for hepatitis C.  Thrombocytopenia Likely due to sepsis.   Platelet count has improved to 136,000  Iron deficiency anemia Hemoglobin stable at 8.4  Robert Kramer's hemoglobin dropped after surgery, and was transfused 1 unit PRBC.    Anemia panel obtained on 9/15 showed iron 25, saturation 13%, TIBC 195, B12 440, reticulocyte count percent 6.1, immature fraction 27.8.   9/22 initiated iron TID with twice daily scheduled Colace to prevent iron-induced constipation Haptoglobin is  normal at 202 10/1 will repeat anemia panel and if iron remains significantly low would likely benefit from IV iron    Data Reviewed: Basic Metabolic Panel: Recent Labs  Lab 04/04/20 0808 04/09/20 0534  NA 132*  --   K 4.2  --   CL 95*  --   CO2 30  --   GLUCOSE 103*  --   BUN 17  --   CREATININE 0.62 0.61  CALCIUM 9.2  --    Liver Function Tests: No results for input(s): AST, ALT, ALKPHOS, BILITOT, PROT, ALBUMIN in the last 168 hours. No results for input(s): LIPASE, AMYLASE in the last 168 hours. No results for input(s): AMMONIA in the last 168 hours. CBC: Recent Labs  Lab 04/04/20 0808  WBC 8.1  HGB 8.6*  HCT 28.5*  MCV 90.8  PLT 150   Cardiac Enzymes: No results for input(s): CKTOTAL, CKMB, CKMBINDEX, TROPONINI in the last 168 hours. BNP (last 3 results) No results for input(s): BNP in the last 8760 hours.  ProBNP (last 3 results) No results for input(s): PROBNP in the last 8760 hours.  CBG: No results for input(s): GLUCAP in the last 168 hours.  No results found for this or any previous  visit (from the past 240 hour(s)).   Studies: DG CHEST PORT 1 VIEW  Result Date: 04/08/2020 CLINICAL DATA:  Check PICC line placement EXAM: PORTABLE CHEST 1 VIEW COMPARISON:  03/21/2020 FINDINGS: Cardiac shadow is within normal limits. Right-sided PICC line is noted with the tip in the distal superior vena cava. Improved aeration is noted in the lungs bilaterally. Small left-sided pleural effusion is seen smaller right-sided effusion is noted. No bony abnormality is seen. IMPRESSION: Improved aeration when compared with the prior exam. PICC line is noted in satisfactory position. Electronically Signed   By: Alcide Clever M.D.   On: 04/08/2020 16:42    Scheduled Meds: . buprenorphine-naloxone  1 tablet Sublingual BID  . Chlorhexidine Gluconate Cloth  6 each Topical Daily  . docusate sodium  100 mg Oral BID  . enoxaparin (LOVENOX) injection  40 mg Subcutaneous Q24H  .  feeding supplement (ENSURE ENLIVE)  237 mL Oral TID BM  . ferrous sulfate  325 mg Oral TID WC  . multivitamin with minerals  1 tablet Oral Daily  . sodium chloride flush  10-40 mL Intracatheter Q12H   Continuous Infusions: . ceFEPime (MAXIPIME) IV 2 g (04/09/20 0533)  . vancomycin 1,500 mg (04/09/20 9678)    Principal Problem:   MRSA bacteremia Active Problems:   Polysubstance dependence (HCC)   Hypokalemia   Thrombocytopenia (HCC)   Serratia septicemia (HCC)   Bipolar 1 disorder (HCC)   Heroin abuse (HCC)   Arthritis, septic, shoulder (HCC)   Endocarditis of tricuspid valve   Hepatitis C virus infection   IVDU (intravenous drug user)   Normocytic anemia   Cigarette smoker   Septic pulmonary embolism (HCC)   Pressure injury of skin   Lumbar back pain   Consultants:  Orthopedics  CVTS  Infectious disease  Procedures: 9/7 2D echocardiogram: 1. Left ventricular ejection fraction, by estimation, is 60 to 65%. The left ventricle has normal function. The left ventricle has no regional wall motion abnormalities. Left ventricular diastolic function could not be evaluated. 2. Right ventricular systolic function is normal. The right ventricular size is normal. There is normal pulmonary artery systolic pressure. The estimated right ventricular systolic pressure is 31.5 mmHg. 3. The mitral valve is normal in structure. No evidence of mitral valve regurgitation. No evidence of mitral stenosis. 4. There are several large shaggy mobile densities one of which measures 1.27 x 1.54cm on the TV worrisome for vegetations. The tricuspid valve is abnormal. 5. The aortic valve is normal in structure. Aortic valve regurgitation is not visualized. No aortic stenosis is present. 6. The inferior vena cava is normal in size with greater than 50% respiratory variability, suggesting right atrial pressure of 3 mmHg.  9/10 TEE: 1. Tricuspid valve endocarditis. 2. Left ventricular ejection fraction,  by estimation, is 60 to 65%. The left ventricle has normal function. 3. Right ventricular systolic function is normal. The right ventricular size is normal. 4. No left atrial/left atrial appendage thrombus was detected. 5. Right atrial size was mildly dilated. 6. A small pericardial effusion is present. The pericardial effusion is circumferential. There is no evidence of cardiac tamponade. 7. The mitral valve is normal in structure. Trivial mitral valve regurgitation. 8. There are 2 separate elongated valvular vegetations along the medial and lateral tricuspid valve leaflets that are approximately 2 cm in length, residing mostly in the right atrial surface. There is associated severe tricuspid regurgitation.. The tricuspid valve is abnormal. Tricuspid valve regurgitation is severe. 9. The aortic valve is normal  in structure. Aortic valve regurgitation is not visualized. 10. There are very small shaggy thin mobile echodensities on the pulmonic valve surface. Trivial pulmonic valve regurgitation. This may represent pulmonic  9/10 left shoulder arthroscopy with extensive debridement, I/D, excisional debridement of the labrum and bursa as well as left shoulder subacromial bursectomy  9/16 application of angio VAC with intraoperative TEE: Right heart cannulation - Debridement of right atrial mass - Debridement of tricuspid valve vegetation  10/4 tPA for occluded PICC  Antibiotics: Anti-infectives (From admission, onward)   Start     Dose/Rate Route Frequency Ordered Stop   04/01/20 2000  vancomycin (VANCOREADY) IVPB 1500 mg/300 mL        1,500 mg 150 mL/hr over 120 Minutes Intravenous Every 12 hours 04/01/20 1140 04/23/20 0759   03/22/20 2000  vancomycin (VANCOREADY) IVPB 1250 mg/250 mL  Status:  Discontinued        1,250 mg 166.7 mL/hr over 90 Minutes Intravenous Every 12 hours 03/22/20 1435 04/01/20 1140   03/20/20 1400  ceFEPIme (MAXIPIME) 2 g in sodium chloride 0.9 % 100 mL IVPB        2  g 200 mL/hr over 30 Minutes Intravenous Every 8 hours 03/20/20 0912 04/22/20 2359   03/19/20 2200  vancomycin (VANCOCIN) IVPB 1000 mg/200 mL premix  Status:  Discontinued        1,000 mg 200 mL/hr over 60 Minutes Intravenous Every 12 hours 03/19/20 1258 03/22/20 1435   03/18/20 1400  cefTRIAXone (ROCEPHIN) 2 g in sodium chloride 0.9 % 100 mL IVPB  Status:  Discontinued        2 g 200 mL/hr over 30 Minutes Intravenous Every 24 hours 03/18/20 1105 03/20/20 0909   03/12/20 2000  cefTRIAXone (ROCEPHIN) 2 g in sodium chloride 0.9 % 100 mL IVPB  Status:  Discontinued        2 g 200 mL/hr over 30 Minutes Intravenous Every 24 hours 03/11/20 2058 03/12/20 0937   03/12/20 1400  ceFEPIme (MAXIPIME) 2 g in sodium chloride 0.9 % 100 mL IVPB  Status:  Discontinued        2 g 200 mL/hr over 30 Minutes Intravenous Every 8 hours 03/12/20 0937 03/18/20 1105   03/12/20 0000  vancomycin (VANCOCIN) IVPB 1000 mg/200 mL premix  Status:  Discontinued        1,000 mg 200 mL/hr over 60 Minutes Intravenous Every 8 hours 03/11/20 1816 03/19/20 1258   03/11/20 1830  cefTRIAXone (ROCEPHIN) 2 g in sodium chloride 0.9 % 100 mL IVPB        2 g 200 mL/hr over 30 Minutes Intravenous  Once 03/11/20 1822 03/11/20 2030   03/11/20 1545  vancomycin (VANCOREADY) IVPB 1500 mg/300 mL        1,500 mg 150 mL/hr over 120 Minutes Intravenous  Once 03/11/20 1532 03/11/20 1819       Time spent: 15 minutes    Junious Silk ANP  [facility] Pager [phone]. If 7PM-7AM, please contact night-coverage at [email or web address] 04/09/2020, 11:43 AM  LOS: 29 days

## 2020-04-09 NOTE — Progress Notes (Signed)
Occupational Therapy Treatment Patient Details Name: Robert Kramer MRN: [id number] DOB: 13-Aug-1975 Today's Date: 04/09/2020    History of present illness Pt is a 44 y.o. male admitted 03/11/20 for evaluation of abnormal blood cultures. Chest CTA shows multiple cavitary and noncavitary nodules consistent with septic emboli; bilateral PNA, bilateral hilar adenopathy. 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 Pt with L shoulder septic arthritis s/p I&D 9/10. S/p catheter-based debridement of tricuspid valve 9/16. PMH includes IVDA, bipolar disorder.  Underwent AngioVac procedure 9/16.   OT comments  Pt. Was cooperative perform ADLs while sitting/standing EOB. Pt. Is S with mobility without ad but using pole. Pt. Performed L UE hep with cues while sitting EOB.   Follow Up Recommendations       Equipment Recommendations       Recommendations for Other Services      Precautions / Restrictions Precautions Precautions: Fall Restrictions LUE Weight Bearing: Weight bearing as tolerated Other Position/Activity Restrictions: AROM as tolerated.  No restrictions to L shoulder       Mobility Bed Mobility         Supine to sit: Independent Sit to supine: Independent      Transfers Overall transfer level: Needs assistance Equipment used: None Transfers: Sit to/from Stand Sit to Stand: Supervision              Balance     Sitting balance-Leahy Scale: Good       Standing balance-Leahy Scale: Fair                             ADL either performed or assessed with clinical judgement   ADL       Grooming: Supervision/safety;Wash/dry face;Wash/dry hands;Oral care;Standing               Lower Body Dressing: Min guard;Sit to/from stand   Toilet Transfer: Supervision/safety;Ambulation;BSC Toilet Transfer Details (indicate cue type and reason): using IV pole, simulated in room   Toileting- Clothing Manipulation and Hygiene:  Supervision/safety;Sit to/from stand       Functional mobility during ADLs: Supervision/safety General ADL Comments: Pt. performed ADLs sitting EOB.     Vision   Vision Assessment?: No apparent visual deficits   Perception     Praxis      Cognition Arousal/Alertness: Awake/alert Behavior During Therapy: WFL for tasks assessed/performed Overall Cognitive Status: Within Functional Limits for tasks assessed                         Following Commands: Follows multi-step commands consistently                Exercises Exercises: General Upper Extremity;Other exercises General Exercises - Upper Extremity Shoulder Flexion: AROM;[name];10 reps;Seated Shoulder Extension: AROM;10 reps;Seated Shoulder ABduction: AROM;Left;10 reps;Seated Shoulder ADduction: AROM;Left;10 reps;Seated Elbow Flexion: AROM;10 reps;Seated Elbow Extension: AROM;Left;Seated;10 reps Wrist Flexion: AROM;Left;Seated;10 reps Wrist Extension: AROM;Left;Seated;10 reps Digit Composite Flexion: AROM;Left;10 reps Composite Extension: AROM;Left;10 reps Shoulder Exercises Shoulder Flexion: AROM;[name];Seated;10 reps;Supine Shoulder ABduction: AROM;[name];Left;Seated;Supine Elbow Flexion: AROM;Left Elbow Extension: AROM;Left Wrist Flexion: AROM;Left Wrist Extension: AROM;Left   Shoulder Instructions       General Comments      Pertinent Vitals/ Pain       Pain Assessment: 0-10 Pain Score: 5  Pain Location: L shld Pain Descriptors / Indicators: Aching;Constant Pain Intervention(s): Premedicated before session;Monitored during session  Home Living  Prior Functioning/Environment              Frequency  Min 2X/week        Progress Toward Goals  OT Goals(current goals can now be found in the care plan section)  Progress towards OT goals: Progressing toward goals  Acute Rehab OT Goals Patient Stated Goal: for arm to  heal OT Goal Formulation: With patient Time For Goal Achievement: 04/12/20 Potential to Achieve Goals: Good ADL Goals Pt Will Perform Upper Body Dressing: Independently;sitting Pt Will Perform Lower Body Dressing: Independently;sit to/from stand Pt Will Transfer to Toilet: with modified independence Pt Will Perform Toileting - Clothing Manipulation and hygiene: Independently Pt/caregiver will Perform Home Exercise Program: Increased ROM;Increased strength;With written HEP provided  Plan Frequency remains appropriate;Discharge plan remains appropriate    Co-evaluation                 AM-PAC OT "6 Clicks" Daily Activity     Outcome Measure   Help from another person eating meals?: None Help from another person taking care of personal grooming?: None Help from another person toileting, which includes using toliet, bedpan, or urinal?: None Help from another person bathing (including washing, rinsing, drying)?: A Little Help from another person to put on and taking off regular upper body clothing?: A Little Help from another person to put on and taking off regular lower body clothing?: A Little 6 Click Score: 21    End of Session    OT Visit Diagnosis: Pain;Muscle weakness (generalized) (M62.81) Pain - Right/Left: Left Pain - part of body: Shoulder   Activity Tolerance Patient tolerated treatment well   Patient Left in bed;with call bell/phone within reach   Nurse Communication          Time:  -     Charges: OT General Charges $OT Visit: 1 Visit OT Treatments $Self Care/Home Management : 8-22 mins $Therapeutic Exercise: 8-22 mins  Robert Kramer OT/L    Robert Kramer 04/09/2020, 9:56 AM

## 2020-04-09 NOTE — Progress Notes (Signed)
     Subjective:  States left shoulder is sore, otherwise no complaints. Denies pain anywhere else.    Objective:  PE: VITALS:   Vitals:   04/08/20 2100 04/09/20 0500 04/09/20 1430 04/09/20 1431  BP: 104/73 101/74 100/86   Pulse: 87 77 78   Resp: 17 14 13 12   Temp: 99.7 F (37.6 C) 98.2 F (36.8 C) 98.1 F (36.7 C)   TempSrc: Oral Oral Oral   SpO2: 99% 96%    Weight:      Height:       General: sitting up in bed, in no acute distress MSK: 0-90degrees forward flexion of left shoulder while sitting up in bed, 15 degrees external rotation, internal rotation to S1.No TTP toshoulder. Surgical wounds have healed well with no drainage.Full ROM through left elbow, wrist, and all fingers of left hand. Distal sensation intact.+ radial pulse.  LABS  Results for orders placed or performed during the hospital encounter of 03/11/20 (from the past 24 hour(s))  Creatinine, serum     Status: None   Collection Time: 04/09/20  5:34 AM  Result Value Ref Range   Creatinine, Ser 0.61 0.61 - 1.24 mg/dL   GFR calc non Af Amer >60 >60 mL/min   GFR calc Af Amer >60 >60 mL/min    DG CHEST PORT 1 VIEW  Result Date: 04/08/2020 CLINICAL DATA:  Check PICC line placement EXAM: PORTABLE CHEST 1 VIEW COMPARISON:  03/21/2020 FINDINGS: Cardiac shadow is within normal limits. Right-sided PICC line is noted with the tip in the distal superior vena cava. Improved aeration is noted in the lungs bilaterally. Small left-sided pleural effusion is seen smaller right-sided effusion is noted. No bony abnormality is seen. IMPRESSION: Improved aeration when compared with the prior exam. PICC line is noted in satisfactory position. Electronically Signed   By: 03/23/2020 M.D.   On: 04/08/2020 16:42    Assessment/Plan:  L shoulder septic arthritis: - no new pain in shoulder or pain elsewhere, continued ROM improvements - continue PT/OT - WBAT as tolerated, can perform motion as tolerated   Will continue to  follow along intermittently   Contact information:   Weekdays 8-5 03-09-2006, PA-C [phone] A fter hours and holidays please check [email or web address] for group call information for [facility]  [phone] 04/09/2020, 3:14 PM

## 2020-04-09 NOTE — Progress Notes (Signed)
Nutrition Follow-up  DOCUMENTATION CODES:   Not applicable  INTERVENTION:   Continue Ensure Enlive po TID, each supplement provides 350 kcal and 20 grams of protein  Continue MVI with Minerals   NUTRITION DIAGNOSIS:   Increased nutrient needs related to acute illness (sepsis) as evidenced by estimated needs.  Being addressed via po intake, supplements  GOAL:   Patient will meet greater than or equal to 90% of their needs  Progressing  MONITOR:   PO intake, Supplement acceptance, Labs, Weight trends, I & O's  REASON FOR ASSESSMENT:   Consult Assessment of nutrition requirement/status  ASSESSMENT:   44 y.o. male with medical history significant for polysubstance use disorder including injection drug use and bipolar disorder who presents to the ED for evaluation of abnormal blood cultures.  9/10 Shoulder surgery 9/16 Tricuspid valve endocarditis s/p angio vac procedure  Pt remains in hospital for IV antibioitcs  Recorded po intake 50-100% of meals, taking supplements  Current wt 58.8 kg; stable over the past week or so. Weight down from admission. Net negative 8 L per I/O flow sheet  Labs: sodium 132 (L), Creatinine wdl Meds: MVI with Minerals, ferrous sulfate, colace    Diet Order:   Diet Order            Diet regular Room service appropriate? Yes; Fluid consistency: Thin  Diet effective now                 EDUCATION NEEDS:   No education needs have been identified at this time  Skin:  Skin Assessment: Skin Integrity Issues: Skin Integrity Issues:: Stage II Stage II: coccyx Incisions: closed incisions to L shoulder, R chest, R groin  Last BM:  10/3  Height:   Ht Readings from Last 1 Encounters:  03/11/20 5\' 11"  (1.803 m)    Weight:   Wt Readings from Last 1 Encounters:  04/08/20 58.8 kg    BMI:  Body mass index is 18.07 kg/m.  Estimated Nutritional Needs:   Kcal:  2000-2200 kcals  Protein:  100-115 grams  Fluid:  >/= 2  L   06/08/20 MS, RDN, LDN, CNSC Registered Dietitian III Clinical Nutrition RD Pager and On-Call Pager Number Located in [place]

## 2020-04-10 NOTE — Consult Note (Signed)
WOC Nurse Consult Note: Patient receiving care in [facility] [phone]. Reason for Consult: wound on right ankle Wound type: Unstageable PI to right lateral ankle Pressure Injury POA: No Measurement: 1.5 cm x 1.1 cm Wound bed: 100% yellow slough Drainage (amount, consistency, odor) none.  Patient endorses mild pain to area. When I asked the patient how the wound developed, he responded "see how my leg flops out to the side when I am in bed?  It laid on the mattress until it got a sore on it".  It is important to note the patient is alert, oriented, lying fully dressed in bed, and gets up and walks around the room.  According to his primary RN, staff had been placing a foam dressing to the area. Periwound: intact Dressing procedure/placement/frequency: Using gauze, soap and water, lightly scrub the right lateral ankle wound to remove as much of the slough as possible with each dressing change.  Rinse, pat dry, then place a hydrocolloid dressing [facility] [place] [phone]) over the wound. Perform daily. Helmut Muster, RN, MSN, CWOCN, CNS-BC, pager [phone]

## 2020-04-10 NOTE — Progress Notes (Signed)
[facility] PROGRESS NOTE  Robert Kramer OFB:[id number] DOB: 12/17/1975 DOA: 03/11/2020 PCP: Patient, No Pcp Per  Status: Inpatient--Remains inpatient appropriate because:Unsafe d/c plan and IV treatments appropriate due to intensity of illness or inability to take PO   Dispo: The patient is from: Home              Anticipated d/c is to: Home              Anticipated d/c date is: > 3 days              Patient currently is not medically stable to d/c.  Needs to complete a full course of IV antibiotics to treat bacteremia and endocarditis.  Patient is an IV drug abuser and it is unsafe for patient to discharge outside of hospital environment with PICC line in place.  9/28 face-to-face completed regarding home health RN, PT and social work-as of 10/1 patient ready refused home health services including PT.  He is agreeable to receipt of a rolling walker.   Code Status: Full Family Communication: Patient only DVT prophylaxis: Lovenox Vaccination status: ?  Has been given [employer] Covid vaccine prior to current admission; was last immunized against influenza in 2018 and did receive pneumococcal vaccine September 2021  HPI: 44 year old male with history of polysubstance use including IV drug use, bipolar disorder came to ED for evaluation of abnormal blood cultures. Patient was seen in [facility] on 03/06/2020 for evaluation management of heroin withdrawal.  He was treated supportively.  Blood cultures were obtained and grew MRSA and Serratia marcescens.  He was called from home to come to ED for further evaluation.  CTA chest PE showed multiple cavitary and noncavitary nodules consistent with septic emboli, bilateral lower lobe pneumonia and bilateral hilar adenopathy felt to be reactive.  No pulmonary embolus was reported.  Patient was started on IV ceftriaxone and vancomycin.  TTE was performed which showed shaggy vegetations on tricuspid valve.  TEE on 03/15/2020 showed 2 x 2 cm long  vegetation on tricuspid valve with resultant severe TR.  MRI left shoulder showed septic arthropathy, myositis and tenosynovitis.  He underwent washout of left shoulder by orthopedic surgery on 03/15/2020.  He was transferred to [facility] on 03/17/2020 for further evaluation of endocarditis by cardiothoracic surgery.  Subjective:  Patient reports slept better after taking Benadryl last night.  No other complaints or issues reported.  Objective: Vitals:   04/09/20 1737 04/09/20 2030  BP: 102/74 109/79  Pulse: 81   Resp: 14   Temp: 97.9 F (36.6 C) 98.2 F (36.8 C)  SpO2:  100%    Intake/Output Summary (Last 24 hours) at 04/10/2020 1308 Last data filed at 04/10/2020 0809 Gross per 24 hour  Intake 240 ml  Output 850 ml  Net -610 ml   Filed Weights   04/06/20 0554 04/07/20 0334 04/08/20 0451  Weight: 56.9 kg 57.8 kg 58.8 kg    Exam:  Constitutional: NAD, calm, awake and appears to be comfortable Respiratory: clear to auscultation bilaterally, Normal respiratory effort. RA  Cardiovascular: Regular rate and rhythm, no murmurs / rubs / gallops. No extremity edema. 2+ pedal pulses. No carotid bruits.  Abdomen: no tenderness, no masses palpated.  Tolerating solid diet.  Bowel sounds positive.  Musculoskeletal: no clubbing / cyanosis. No joint deformity upper and lower extremities. Normal muscle tone.  Neurologic: CN 2-12 grossly intact. Sensation intact, DTR normal. Strength 5/5 x all 4 extremities.  Psychiatric: Normal judgment and insight.  Alert and oriented x 3. Normal mood.    Assessment/Plan: Severe sepsis secondary to MRSA and Serratia bacteremia-pulmonary septic emboli, TV endocarditis, left shoulder septic arthritis.   Patient is status post left shoulder arthroscopy with debridement irrigation for septic arthritis as per orthopedics on 03/15/2020.  Echocardiogram showed tricuspid vegetation.  TEE showed 2x2 mobile vegetation with severe TR.   MRI lumbar spine showed no evidence  of epidural abscess.  Patient refused MRI right shoulder ordered by ID.   CT surgery was consulted - s/p angiovac debridement on 03/21/2020.  Infectious disease following.  Continue IV vancomycin, Rocephin was changed to cefepime per ID. Repeat blood cultures drawn on 9/8 negative.  ID recommended 4 more weeks of IV antibiotic therapy starting from 03/21/2020 with vancomycin and cefepime until 04/18/2020.  Rec to call ID back prior to discharge to reassess whether he requires outpatient regimen. 10/4 became occluded and required the lytics to resolve  TV endocarditis-s/p Angiovac   CT surgery following prn  Insomnia 9/30 begin as needed Benadryl at HS  Hyponatremia sodium is stable at 133 as of 9/23 Has been low since last month.   Serum osmolality is 281.  Substance use disorder Ongoing IV heroin use, last use was 3 days prior to admission.   Patient started on Suboxone 8/2 mg twice daily with as needed Suboxone per opiate withdrawal protocol.   Poke with Dr. Erlinda Honguncan Vincent with Suboxone clinic who agreed patient is an appropriate candidate.  He requested that patient be given number to contact [facility] arrange for outpatient follow-up; patient has completed this request. OUD intake assessment completed by telephone. Patient has been scheduled for 04/24/2020 at 9:45 with Dr. Nedra HaiLee.  HIV antibodies were nonreactive Hepatitis C antibodies were reactive. Hepatitis C genotype 1b.  Patient can follow-up with GI as outpatient for hepatitis C.  Thrombocytopenia Likely due to sepsis.   Platelet count has improved to 136,000  Iron deficiency anemia Hemoglobin stable at 8.4  Patient's hemoglobin dropped after surgery, and was transfused 1 unit PRBC.    Anemia panel obtained on 9/15 showed iron 25, saturation 13%, TIBC 195, B12 440, reticulocyte count percent 6.1, immature fraction 27.8.   9/22 initiated iron TID with twice daily scheduled Colace to prevent iron-induced  constipation Haptoglobin is normal at 202 10/1 will repeat anemia panel and if iron remains significantly low would likely benefit from IV iron    Data Reviewed: Basic Metabolic Panel: Recent Labs  Lab 04/04/20 0808 04/09/20 0534  NA 132*  --   K 4.2  --   CL 95*  --   CO2 30  --   GLUCOSE 103*  --   BUN 17  --   CREATININE 0.62 0.61  CALCIUM 9.2  --    Liver Function Tests: No results for input(s): AST, ALT, ALKPHOS, BILITOT, PROT, ALBUMIN in the last 168 hours. No results for input(s): LIPASE, AMYLASE in the last 168 hours. No results for input(s): AMMONIA in the last 168 hours. CBC: Recent Labs  Lab 04/04/20 0808  WBC 8.1  HGB 8.6*  HCT 28.5*  MCV 90.8  PLT 150   Cardiac Enzymes: No results for input(s): CKTOTAL, CKMB, CKMBINDEX, TROPONINI in the last 168 hours. BNP (last 3 results) No results for input(s): BNP in the last 8760 hours.  ProBNP (last 3 results) No results for input(s): PROBNP in the last 8760 hours.  CBG: No results for input(s): GLUCAP in the last 168 hours.  No results found  for this or any previous visit (from the past 240 hour(s)).   Studies: DG CHEST PORT 1 VIEW  Result Date: 04/08/2020 CLINICAL DATA:  Check PICC line placement EXAM: PORTABLE CHEST 1 VIEW COMPARISON:  03/21/2020 FINDINGS: Cardiac shadow is within normal limits. Right-sided PICC line is noted with the tip in the distal superior vena cava. Improved aeration is noted in the lungs bilaterally. Small left-sided pleural effusion is seen smaller right-sided effusion is noted. No bony abnormality is seen. IMPRESSION: Improved aeration when compared with the prior exam. PICC line is noted in satisfactory position. Electronically Signed   By: Alcide Clever M.D.   On: 04/08/2020 16:42    Scheduled Meds: . buprenorphine-naloxone  1 tablet Sublingual BID  . Chlorhexidine Gluconate Cloth  6 each Topical Daily  . docusate sodium  100 mg Oral BID  . enoxaparin (LOVENOX) injection  40 mg  Subcutaneous Q24H  . feeding supplement (ENSURE ENLIVE)  237 mL Oral TID BM  . ferrous sulfate  325 mg Oral TID WC  . multivitamin with minerals  1 tablet Oral Daily  . sodium chloride flush  10-40 mL Intracatheter Q12H   Continuous Infusions: . ceFEPime (MAXIPIME) IV 2 g (04/10/20 1131)  . vancomycin 1,500 mg (04/10/20 0809)    Principal Problem:   MRSA bacteremia Active Problems:   Polysubstance dependence (HCC)   Hypokalemia   Thrombocytopenia (HCC)   Serratia septicemia (HCC)   Bipolar 1 disorder (HCC)   Heroin abuse (HCC)   Arthritis, septic, shoulder (HCC)   Endocarditis of tricuspid valve   Hepatitis C virus infection   IVDU (intravenous drug user)   Normocytic anemia   Cigarette smoker   Septic pulmonary embolism (HCC)   Pressure injury of skin   Lumbar back pain   Consultants:  Orthopedics  CVTS  Infectious disease  Procedures: 9/7 2D echocardiogram: 1. Left ventricular ejection fraction, by estimation, is 60 to 65%. The left ventricle has normal function. The left ventricle has no regional wall motion abnormalities. Left ventricular diastolic function could not be evaluated. 2. Right ventricular systolic function is normal. The right ventricular size is normal. There is normal pulmonary artery systolic pressure. The estimated right ventricular systolic pressure is 31.5 mmHg. 3. The mitral valve is normal in structure. No evidence of mitral valve regurgitation. No evidence of mitral stenosis. 4. There are several large shaggy mobile densities one of which measures 1.27 x 1.54cm on the TV worrisome for vegetations. The tricuspid valve is abnormal. 5. The aortic valve is normal in structure. Aortic valve regurgitation is not visualized. No aortic stenosis is present. 6. The inferior vena cava is normal in size with greater than 50% respiratory variability, suggesting right atrial pressure of 3 mmHg.  9/10 TEE: 1. Tricuspid valve endocarditis. 2. Left  ventricular ejection fraction, by estimation, is 60 to 65%. The left ventricle has normal function. 3. Right ventricular systolic function is normal. The right ventricular size is normal. 4. No left atrial/left atrial appendage thrombus was detected. 5. Right atrial size was mildly dilated. 6. A small pericardial effusion is present. The pericardial effusion is circumferential. There is no evidence of cardiac tamponade. 7. The mitral valve is normal in structure. Trivial mitral valve regurgitation. 8. There are 2 separate elongated valvular vegetations along the medial and lateral tricuspid valve leaflets that are approximately 2 cm in length, residing mostly in the right atrial surface. There is associated severe tricuspid regurgitation.. The tricuspid valve is abnormal. Tricuspid valve regurgitation is severe. 9.  The aortic valve is normal in structure. Aortic valve regurgitation is not visualized. 10. There are very small shaggy thin mobile echodensities on the pulmonic valve surface. Trivial pulmonic valve regurgitation. This may represent pulmonic  9/10 left shoulder arthroscopy with extensive debridement, I/D, excisional debridement of the labrum and bursa as well as left shoulder subacromial bursectomy  9/16 application of angio VAC with intraoperative TEE: Right heart cannulation - Debridement of right atrial mass - Debridement of tricuspid valve vegetation  10/4 tPA for occluded PICC  Antibiotics: Anti-infectives (From admission, onward)   Start     Dose/Rate Route Frequency Ordered Stop   04/01/20 2000  vancomycin (VANCOREADY) IVPB 1500 mg/300 mL        1,500 mg 150 mL/hr over 120 Minutes Intravenous Every 12 hours 04/01/20 1140 04/23/20 0759   03/22/20 2000  vancomycin (VANCOREADY) IVPB 1250 mg/250 mL  Status:  Discontinued        1,250 mg 166.7 mL/hr over 90 Minutes Intravenous Every 12 hours 03/22/20 1435 04/01/20 1140   03/20/20 1400  ceFEPIme (MAXIPIME) 2 g in sodium  chloride 0.9 % 100 mL IVPB        2 g 200 mL/hr over 30 Minutes Intravenous Every 8 hours 03/20/20 0912 04/22/20 2359   03/19/20 2200  vancomycin (VANCOCIN) IVPB 1000 mg/200 mL premix  Status:  Discontinued        1,000 mg 200 mL/hr over 60 Minutes Intravenous Every 12 hours 03/19/20 1258 03/22/20 1435   03/18/20 1400  cefTRIAXone (ROCEPHIN) 2 g in sodium chloride 0.9 % 100 mL IVPB  Status:  Discontinued        2 g 200 mL/hr over 30 Minutes Intravenous Every 24 hours 03/18/20 1105 03/20/20 0909   03/12/20 2000  cefTRIAXone (ROCEPHIN) 2 g in sodium chloride 0.9 % 100 mL IVPB  Status:  Discontinued        2 g 200 mL/hr over 30 Minutes Intravenous Every 24 hours 03/11/20 2058 03/12/20 0937   03/12/20 1400  ceFEPIme (MAXIPIME) 2 g in sodium chloride 0.9 % 100 mL IVPB  Status:  Discontinued        2 g 200 mL/hr over 30 Minutes Intravenous Every 8 hours 03/12/20 0937 03/18/20 1105   03/12/20 0000  vancomycin (VANCOCIN) IVPB 1000 mg/200 mL premix  Status:  Discontinued        1,000 mg 200 mL/hr over 60 Minutes Intravenous Every 8 hours 03/11/20 1816 03/19/20 1258   03/11/20 1830  cefTRIAXone (ROCEPHIN) 2 g in sodium chloride 0.9 % 100 mL IVPB        2 g 200 mL/hr over 30 Minutes Intravenous  Once 03/11/20 1822 03/11/20 2030   03/11/20 1545  vancomycin (VANCOREADY) IVPB 1500 mg/300 mL        1,500 mg 150 mL/hr over 120 Minutes Intravenous  Once 03/11/20 1532 03/11/20 1819       Time spent: 15 minutes    Junious Silk ANP  [facility] Pager [phone]. If 7PM-7AM, please contact night-coverage at [email or web address] 04/10/2020, 1:08 PM  LOS: 30 days

## 2020-04-10 NOTE — Progress Notes (Signed)
Physical Therapy Treatment Patient Details Name: Robert Kramer MRN: [id number] DOB: 1975/10/18 Today's Date: 04/10/2020    History of Present Illness Pt is a 44 y.o. male admitted 03/11/20 for evaluation of abnormal blood cultures. Chest CTA shows multiple cavitary and noncavitary nodules consistent with septic emboli; bilateral PNA, bilateral hilar adenopathy. 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 Pt with L shoulder septic arthritis s/p I&D 9/10. S/p catheter-based debridement of tricuspid valve 9/16. PMH includes IVDA, bipolar disorder.  Underwent AngioVac procedure 9/16.    PT Comments    Pt fully participated in session; pt limited by fatigue; pt demonstrating increased balance deficits during ambulation without AD: pt motivated to improve balance and strength; pt will continues to benefit from skilled PT to address deficits in strength, balance, gait, endurance and L shoulder ROM to maximize independence with functional mobility prior to discharge.     Follow Up Recommendations  Home health PT;Supervision/Assistance - 24 hour     Equipment Recommendations  Rolling walker with 5" wheels    Recommendations for Other Services       Precautions / Restrictions Precautions Precautions: Fall Restrictions Weight Bearing Restrictions: No Other Position/Activity Restrictions: AROM as tolerated.  No restrictions to L shoulder    Mobility  Bed Mobility pt performed supine<>sit without assist with HOB lowered                  Transfers stand step transfers without RW performed from EOB with S; performed transfer on/off toilet with need of grab bars and S from therapist for safety                    Ambulation/Gait 150 feet min assist initially, with continued ambulation min guard; pt with increased stepping reactions needed initially to maintain balance and min A from therapist; pt with wide BOS and varying step lengths; pt able to improve  ambulation and balance with increased distance; pt performed ambulation into bathroom with 1 LOB requiring use of walls to regain balance; pt extremely fatigued following ambulation and toileting requiring increased time resting EOB                 Stairs             Wheelchair Mobility    Modified Rankin (Stroke Patients Only)       Balance                                            Cognition Arousal/Alertness: Awake/alert Behavior During Therapy: WFL for tasks assessed/performed Overall Cognitive Status: Within Functional Limits for tasks assessed                                        Exercises  sit<>stand from EOB with UE support on knees 10 reps without assist or LOB; performed for LE strengthening and overall endurance    General Comments        Pertinent Vitals/Pain Pain Assessment: 0-10 Faces Pain Scale: Hurts little more Pain Location: L shld Pain Descriptors / Indicators: Aching    Home Living                      Prior Function  PT Goals (current goals can now be found in the care plan section) Acute Rehab PT Goals Patient Stated Goal: for arm to heal PT Goal Formulation: With patient Potential to Achieve Goals: Good Progress towards PT goals: Progressing toward goals    Frequency    Min 3X/week      PT Plan Current plan remains appropriate    Co-evaluation              AM-PAC PT "6 Clicks" Mobility   Outcome Measure  Help needed turning from your back to your side while in a flat bed without using bedrails?: None Help needed moving from lying on your back to sitting on the side of a flat bed without using bedrails?: None Help needed moving to and from a bed to a chair (including a wheelchair)?: None Help needed standing up from a chair using your arms (e.g., wheelchair or bedside chair)?: None Help needed to walk in hospital room?: A Little Help needed climbing 3-5  steps with a railing? : A Little 6 Click Score: 22    End of Session Equipment Utilized During Treatment: Gait belt Activity Tolerance: Patient tolerated treatment well Patient left: in bed;with call bell/phone within reach Nurse Communication: Mobility status PT Visit Diagnosis: Difficulty in walking, not elsewhere classified (R26.2);Other abnormalities of gait and mobility (R26.89)     Time: 7253-6644 PT Time Calculation (min) (ACUTE ONLY): 19 min  Charges:  $Gait Training: 8-22 mins                     Ginette Otto, DPT Acute Rehabilitation Services [phone]  Lucretia Field 04/10/2020, 12:33 PM

## 2020-04-10 NOTE — Progress Notes (Signed)
Pharmacy Antibiotic Note  Robert Kramer is a 44 y.o. 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 Angiovac procedure completed 9/16. Additionally, patient has L shoulder septic arthritis s/p I&D 9/10 with culture growing rare MRSA. Pharmacy has been consulted for cefepime dosing for Serratia bacteremia/endocarditis (given ampC organism). Plans to continue antibiotics until 04/22/20 -SCr= 0.61 on 10.5  Plan: Continue cefepime 2g q8 hours Continue vancomycin 1500mg  IV q12h Vancomycin trough on 10/7  Height: 5\' 11"  (180.3 cm) Weight: 58.8 kg (129 lb 8.9 oz) IBW/kg (Calculated) : 75.3  Temp (24hrs), Avg:98.1 F (36.7 C), Min:97.9 F (36.6 C), Max:98.2 F (36.8 C)  Recent Labs  Lab 04/04/20 0808 04/09/20 0534  WBC 8.1  --   CREATININE 0.62 0.61  VANCOTROUGH 19  --     Estimated Creatinine Clearance: 96 mL/min (by C-G formula based on SCr of 0.61 mg/dL).    Allergies  Allergen Reactions  . Tramadol Other (See Comments)    Upset stomach   Antimicrobials this admission: Vancomycin 9/6 >>  Ceftriaxone 9/6 >>9/7, 9/13>>9/15 Cefepime 9/7 >>9/13, 9/15>>  Microbiology results: 9/1 BCx x1: MRSA 9/1 BCx x1: Serratia Marcescens (S= Bactrim, Cipro; R=Cefazolin) 9/6 UCx: mult sp F 9/6 BCx: MRSA F 9/6 COVID: neg 9/8 Hep A reactive 9/8 Hep B NR 9/8 Hep C reactive 9/8 BCx2 ngtd 9/10 MRSA PCR neg  9/10 L shoulder synovial fluid: rare MRSA 9/16 tissue/veg: MRSA  Thank you for allowing pharmacy to be a part of this patient's care.  11/8, PharmD Clinical Pharmacist **Pharmacist phone directory can now be found on [email or web address] ([email or web address]).  Listed under [facility].

## 2020-04-11 LAB — VANCOMYCIN, TROUGH: Vancomycin Tr: 28 ug/mL (ref 15–20)

## 2020-04-11 MED ORDER — VANCOMYCIN HCL IN DEXTROSE 1-5 GM/200ML-% IV SOLN
1000.0000 mg | Freq: Two times a day (BID) | INTRAVENOUS | Status: DC
  Administered 2020-04-12 – 2020-04-16 (×10): 1000 mg via INTRAVENOUS
  Filled 2020-04-11 (×10): qty 200

## 2020-04-11 NOTE — Progress Notes (Addendum)
[facility] PROGRESS NOTE  Robert KluverJames E Kramer WGN:[id number] DOB: [birth year] DOA: 03/11/2020 PCP: Patient, No Pcp Per  Status: Inpatient--Remains inpatient appropriate because:Unsafe d/c plan and IV treatments appropriate due to intensity of illness or inability to take PO   Dispo: The patient is from: Home              Anticipated d/c is to: Home              Anticipated d/c date is: > 3 days              Patient currently is not medically stable to d/c.  Needs to complete a full course of IV antibiotics to treat bacteremia and endocarditis.  Patient is an IV drug abuser and it is unsafe for patient to discharge outside of hospital environment with PICC line in place.  9/28 face-to-face completed regarding home health RN, PT and social work-as of 10/1 patient ready refused home health services including PT.  He is agreeable to receipt of a rolling walker.   Code Status: Full Family Communication: Patient only DVT prophylaxis: Lovenox Vaccination status: ?  Has been given Laural BenesJohnson & Laural BenesJohnson Covid vaccine prior to current admission; was last immunized against influenza in 2018 and did receive pneumococcal vaccine September 2021  HPI: 44 year old male with history of polysubstance use including IV drug use, bipolar disorder came to ED for evaluation of abnormal blood cultures. Patient was seen in [facility] on 03/06/2020 for evaluation management of heroin withdrawal.  He was treated supportively.  Blood cultures were obtained and grew MRSA and Serratia marcescens.  He was called from home to come to ED for further evaluation.  CTA chest PE showed multiple cavitary and noncavitary nodules consistent with septic emboli, bilateral lower lobe pneumonia and bilateral hilar adenopathy felt to be reactive.  No pulmonary embolus was reported.  Patient was started on IV ceftriaxone and vancomycin.  TTE was performed which showed shaggy vegetations on tricuspid valve.  TEE on 03/15/2020 showed 2 x 2 cm long  vegetation on tricuspid valve with resultant severe TR.  MRI left shoulder showed septic arthropathy, myositis and tenosynovitis.  He underwent washout of left shoulder by orthopedic surgery on 03/15/2020.  He was [facility] to [facility] on 03/17/2020 for further evaluation of endocarditis by cardiothoracic surgery.  Subjective:  Awakened.  Slept well last night and did not require Benadryl.  Updated patient on anticipated discharge date next Thursday 10/13 but he would be responsible for the co-pay for the Suboxone of $25 and recommended he contact family to ensure they can pick this prescription up once available.  Objective: Vitals:   04/10/20 2305 04/11/20 0631  BP: 101/76 102/80  Pulse: 82 78  Resp: 17 14  Temp: 98.1 F (36.7 C) 97.7 F (36.5 C)  SpO2: 96% 96%    Intake/Output Summary (Last 24 hours) at 04/11/2020 0858 Last data filed at 04/11/2020 [id number] Gross per 24 hour  Intake 240 ml  Output 2250 ml  Net -2010 ml   Filed Weights   04/07/20 0334 04/08/20 0451 04/11/20 0631  Weight: 57.8 kg 58.8 kg 58 kg    Exam:  Constitutional: NAD, calm, awakened and comfortable Respiratory: clear to auscultation bilaterally, Normal respiratory effort. RA  Cardiovascular: Regular rate and rhythm, no murmurs / rubs / gallops. No extremity edema. 2+ pedal pulses. No carotid bruits.  Abdomen: no tenderness, no masses palpated.  Tolerating solid diet.  Bowel sounds positive.  Musculoskeletal: no clubbing / cyanosis. No joint  deformity upper and lower extremities. Normal muscle tone.  Neurologic: CN 2-12 grossly intact. Sensation intact, DTR normal. Strength 5/5 x all 4 extremities.  Psychiatric: Normal judgment and insight. Alert and oriented x 3. Normal mood.    Assessment/Plan: Severe sepsis secondary to MRSA and Serratia bacteremia-pulmonary septic emboli, TV endocarditis, left shoulder septic arthritis.   Patient is status post left shoulder arthroscopy with debridement irrigation for  septic arthritis as per orthopedics on 03/15/2020.  Echocardiogram showed tricuspid vegetation.  TEE showed 2x2 mobile vegetation with severe TR.   MRI lumbar spine showed no evidence of epidural abscess.  Patient refused MRI right shoulder ordered by ID.   CT surgery was consulted - s/p angiovac debridement on 03/21/2020.  Infectious disease following.  Continue IV vancomycin, Rocephin was changed to cefepime per ID. Repeat blood cultures drawn on 9/8 negative.  ID recommended 4 more weeks of IV antibiotic therapy starting from 03/21/2020 with vancomycin and cefepime until 04/22/2020.  Rec to call ID back prior to discharge to reassess whether he requires outpatient regimen. 10/4 became occluded and required the lytics to resolve  TV endocarditis-s/p Angiovac   CT surgery following prn  Insomnia Had successful improvement in insomnia symptoms with use of prn Benadryl  Hyponatremia sodium is stable at 133 as of 9/23 Has been low since last month.   Serum osmolality is 281.  Substance use disorder Ongoing IV heroin use, last use was 3 days prior to admission.   Patient started on Suboxone 8/2 mg twice daily with as needed Suboxone per opiate withdrawal protocol.   Poke with Dr. Erlinda Hong with Suboxone clinic who agreed patient is an appropriate candidate.  He requested that patient be given number to contact [facility] clinic arrange for outpatient follow-up; patient has completed this request. OUD intake assessment completed by telephone. Patient has been scheduled for 04/24/2020 at 9:45 with Dr. Nedra Hai.  HIV antibodies were nonreactive Hepatitis C antibodies were reactive. Hepatitis C genotype 1b.  Patient can follow-up with GI as outpatient for hepatitis C.  Thrombocytopenia Likely due to sepsis.   Platelet count has improved to 136,000  Iron deficiency anemia Hemoglobin stable at 8.4  Patient's hemoglobin dropped after surgery, and was transfused 1 unit PRBC.    Anemia panel  obtained on 9/15 showed iron 25, saturation 13%, TIBC 195, B12 440, reticulocyte count percent 6.1, immature fraction 27.8.   9/22 initiated iron TID with twice daily scheduled Colace to prevent iron-induced constipation Haptoglobin is normal at 202 10/1 will repeat anemia panel and if iron remains significantly low would likely benefit from IV iron    Data Reviewed: Basic Metabolic Panel: Recent Labs  Lab 04/09/20 0534  CREATININE 0.61   Liver Function Tests: No results for input(s): AST, ALT, ALKPHOS, BILITOT, PROT, ALBUMIN in the last 168 hours. No results for input(s): LIPASE, AMYLASE in the last 168 hours. No results for input(s): AMMONIA in the last 168 hours. CBC: No results for input(s): WBC, NEUTROABS, HGB, HCT, MCV, PLT in the last 168 hours. Cardiac Enzymes: No results for input(s): CKTOTAL, CKMB, CKMBINDEX, TROPONINI in the last 168 hours. BNP (last 3 results) No results for input(s): BNP in the last 8760 hours.  ProBNP (last 3 results) No results for input(s): PROBNP in the last 8760 hours.  CBG: No results for input(s): GLUCAP in the last 168 hours.  No results found for this or any previous visit (from the past 240 hour(s)).   Studies: No results found.  Scheduled  Meds: . buprenorphine-naloxone  1 tablet Sublingual BID  . Chlorhexidine Gluconate Cloth  6 each Topical Daily  . docusate sodium  100 mg Oral BID  . enoxaparin (LOVENOX) injection  40 mg Subcutaneous Q24H  . feeding supplement (ENSURE ENLIVE)  237 mL Oral TID BM  . ferrous sulfate  325 mg Oral TID WC  . multivitamin with minerals  1 tablet Oral Daily  . sodium chloride flush  10-40 mL Intracatheter Q12H   Continuous Infusions: . ceFEPime (MAXIPIME) IV 2 g (04/11/20 1749)  . vancomycin 1,500 mg (04/11/20 0739)    Principal Problem:   MRSA bacteremia Active Problems:   Polysubstance dependence (HCC)   Hypokalemia   Thrombocytopenia (HCC)   Serratia septicemia (HCC)   Bipolar 1 disorder  (HCC)   Heroin abuse (HCC)   Arthritis, septic, shoulder (HCC)   Endocarditis of tricuspid valve   Hepatitis C virus infection   IVDU (intravenous drug user)   Normocytic anemia   Cigarette smoker   Septic pulmonary embolism (HCC)   Pressure injury of skin   Lumbar back pain   Consultants:  Orthopedics  CVTS  Infectious disease  Procedures: 9/7 2D echocardiogram: 1. Left ventricular ejection fraction, by estimation, is 60 to 65%. The left ventricle has normal function. The left ventricle has no regional wall motion abnormalities. Left ventricular diastolic function could not be evaluated. 2. Right ventricular systolic function is normal. The right ventricular size is normal. There is normal pulmonary artery systolic pressure. The estimated right ventricular systolic pressure is 31.5 mmHg. 3. The mitral valve is normal in structure. No evidence of mitral valve regurgitation. No evidence of mitral stenosis. 4. There are several large shaggy mobile densities one of which measures 1.27 x 1.54cm on the TV worrisome for vegetations. The tricuspid valve is abnormal. 5. The aortic valve is normal in structure. Aortic valve regurgitation is not visualized. No aortic stenosis is present. 6. The inferior vena cava is normal in size with greater than 50% respiratory variability, suggesting right atrial pressure of 3 mmHg.  9/10 TEE: 1. Tricuspid valve endocarditis. 2. Left ventricular ejection fraction, by estimation, is 60 to 65%. The left ventricle has normal function. 3. Right ventricular systolic function is normal. The right ventricular size is normal. 4. No left atrial/left atrial appendage thrombus was detected. 5. Right atrial size was mildly dilated. 6. A small pericardial effusion is present. The pericardial effusion is circumferential. There is no evidence of cardiac tamponade. 7. The mitral valve is normal in structure. Trivial mitral valve regurgitation. 8. There are 2 separate  elongated valvular vegetations along the medial and lateral tricuspid valve leaflets that are approximately 2 cm in length, residing mostly in the right atrial surface. There is associated severe tricuspid regurgitation.. The tricuspid valve is abnormal. Tricuspid valve regurgitation is severe. 9. The aortic valve is normal in structure. Aortic valve regurgitation is not visualized. 10. There are very small shaggy thin mobile echodensities on the pulmonic valve surface. Trivial pulmonic valve regurgitation. This may represent pulmonic  9/10 left shoulder arthroscopy with extensive debridement, I/D, excisional debridement of the labrum and bursa as well as left shoulder subacromial bursectomy  9/16 application of angio VAC with intraoperative TEE: Right heart cannulation - Debridement of right atrial mass - Debridement of tricuspid valve vegetation  10/4 tPA for occluded PICC  Antibiotics: Anti-infectives (From admission, onward)   Start     Dose/Rate Route Frequency Ordered Stop   04/01/20 2000  vancomycin (VANCOREADY) IVPB 1500 mg/300 mL  1,500 mg 150 mL/hr over 120 Minutes Intravenous Every 12 hours 04/01/20 1140 04/23/20 0759   03/22/20 2000  vancomycin (VANCOREADY) IVPB 1250 mg/250 mL  Status:  Discontinued        1,250 mg 166.7 mL/hr over 90 Minutes Intravenous Every 12 hours 03/22/20 1435 04/01/20 1140   03/20/20 1400  ceFEPIme (MAXIPIME) 2 g in sodium chloride 0.9 % 100 mL IVPB        2 g 200 mL/hr over 30 Minutes Intravenous Every 8 hours 03/20/20 0912 04/22/20 2359   03/19/20 2200  vancomycin (VANCOCIN) IVPB 1000 mg/200 mL premix  Status:  Discontinued        1,000 mg 200 mL/hr over 60 Minutes Intravenous Every 12 hours 03/19/20 1258 03/22/20 1435   03/18/20 1400  cefTRIAXone (ROCEPHIN) 2 g in sodium chloride 0.9 % 100 mL IVPB  Status:  Discontinued        2 g 200 mL/hr over 30 Minutes Intravenous Every 24 hours 03/18/20 1105 03/20/20 0909   03/12/20 2000  cefTRIAXone  (ROCEPHIN) 2 g in sodium chloride 0.9 % 100 mL IVPB  Status:  Discontinued        2 g 200 mL/hr over 30 Minutes Intravenous Every 24 hours 03/11/20 2058 03/12/20 0937   03/12/20 1400  ceFEPIme (MAXIPIME) 2 g in sodium chloride 0.9 % 100 mL IVPB  Status:  Discontinued        2 g 200 mL/hr over 30 Minutes Intravenous Every 8 hours 03/12/20 0937 03/18/20 1105   03/12/20 0000  vancomycin (VANCOCIN) IVPB 1000 mg/200 mL premix  Status:  Discontinued        1,000 mg 200 mL/hr over 60 Minutes Intravenous Every 8 hours 03/11/20 1816 03/19/20 1258   03/11/20 1830  cefTRIAXone (ROCEPHIN) 2 g in sodium chloride 0.9 % 100 mL IVPB        2 g 200 mL/hr over 30 Minutes Intravenous  Once 03/11/20 1822 03/11/20 2030   03/11/20 1545  vancomycin (VANCOREADY) IVPB 1500 mg/300 mL        1,500 mg 150 mL/hr over 120 Minutes Intravenous  Once 03/11/20 1532 03/11/20 1819       Time spent: 15 minutes    Robert Kramer ANP  [facility] Pager [phone]. If 7PM-7AM, please contact night-coverage at [email or web address] 04/11/2020, 8:58 AM  LOS: 31 days

## 2020-04-11 NOTE — Progress Notes (Addendum)
Pharmacy Antibiotic Note  Robert Kramer is a 44 y.o. 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 Angiovac procedure completed 9/16. Additionally, patient has L shoulder septic arthritis s/p I&D 9/10 with culture growing rare MRSA. Pharmacy has been consulted for cefepime dosing for Serratia bacteremia/endocarditis (given ampC organism). Plans to continue antibiotics until 04/22/20 -SCr= 0.61 on 10/5 -vancomycin trough = 28 (collected prior to dose) on 1500mg  IV q12h -vancomycin 1500mg  Iv given ~ 7:30am  Plan: Continue cefepime 2g q8 hours Change vancomycin to 1000mg  IV q12h BMET in am   Height: 5\' 11"  (180.3 cm) Weight: 58 kg (127 lb 14.4 oz) IBW/kg (Calculated) : 75.3  Temp (24hrs), Avg:98 F (36.7 C), Min:97.7 F (36.5 C), Max:98.2 F (36.8 C)  Recent Labs  Lab 04/09/20 0534 04/11/20 0738  CREATININE 0.61  --   VANCOTROUGH  --  28*    Estimated Creatinine Clearance: 94.7 mL/min (by C-G formula based on SCr of 0.61 mg/dL).    Allergies  Allergen Reactions   Tramadol Other (See Comments)    Upset stomach   Antimicrobials this admission: Vancomycin 9/6 >>  Ceftriaxone 9/6 >>9/7, 9/13>>9/15 Cefepime 9/7 >>9/13, 9/15>>  Microbiology results: 9/1 BCx x1: MRSA 9/1 BCx x1: Serratia Marcescens (S= Bactrim, Cipro; R=Cefazolin) 9/6 UCx: mult sp F 9/6 BCx: MRSA F 9/6 COVID: neg 9/8 Hep A reactive 9/8 Hep B NR 9/8 Hep C reactive 9/8 BCx2 ngtd 9/10 MRSA PCR neg  9/10 L shoulder synovial fluid: rare MRSA 9/16 tissue/veg: MRSA  Thank you for allowing pharmacy to be a part of this patients care.  11/8, PharmD Clinical Pharmacist **Pharmacist phone directory can now be found on [email or web address] ([email or web address]).  Listed under [facility].

## 2020-04-11 NOTE — Care Management (Signed)
04-11-20 1051 Spoke with patient regarding clarification of discharge date and confirmed  the Suboxone Clinic appointment date and time with the patient. Patient was very appreciative of the information. Case Manager will continue to follow. Gala Lewandowsky, RN,BSN Case Manager

## 2020-04-12 LAB — BASIC METABOLIC PANEL
Anion gap: 6 (ref 5–15)
BUN: 25 mg/dL — ABNORMAL HIGH (ref 6–20)
CO2: 32 mmol/L (ref 22–32)
Calcium: 9.7 mg/dL (ref 8.9–10.3)
Chloride: 96 mmol/L — ABNORMAL LOW (ref 98–111)
Creatinine, Ser: 0.64 mg/dL (ref 0.61–1.24)
GFR calc non Af Amer: >60 mL/min (ref >60)
Glucose, Bld: 99 mg/dL (ref 70–99)
Potassium: 4.1 mmol/L (ref 3.5–5.1)
Sodium: 134 mmol/L — ABNORMAL LOW (ref 135–145)

## 2020-04-12 NOTE — Progress Notes (Signed)
[facility] PROGRESS NOTE  Robert Kramer HER:[id number] DOB: [birth year] DOA: 03/11/2020 PCP: Patient, No Pcp Per  Status: Inpatient--Remains inpatient appropriate because:Unsafe d/c plan and IV treatments appropriate due to intensity of illness or inability to take PO   Dispo: The patient is from: Home              Anticipated d/c is to: Home              Anticipated d/c date is: > 3 days              Patient currently is not medically stable to d/c.  Needs to complete a full course of IV antibiotics to treat bacteremia and endocarditis.  Patient is an IV drug abuser and it is unsafe for patient to discharge outside of hospital environment with PICC line in place.  9/28 face-to-face completed regarding home health RN, PT and social work-as of 10/1 patient ready refused home health services including PT.  He is agreeable to receipt of a rolling walker.   Code Status: Full Family Communication: Patient only DVT prophylaxis: Lovenox Vaccination status: ?  Has been given [employer] Covid vaccine prior to current admission; was last immunized against influenza in 2018 and did receive pneumococcal vaccine September 2021  HPI: 44 year old male with history of polysubstance use including IV drug use, bipolar disorder came to ED for evaluation of abnormal blood cultures. Patient was seen in [facility] on 03/06/2020 for evaluation management of heroin withdrawal.  He was treated supportively.  Blood cultures were obtained and grew MRSA and Serratia marcescens.  He was called from home to come to ED for further evaluation.  CTA chest PE showed multiple cavitary and noncavitary nodules consistent with septic emboli, bilateral lower lobe pneumonia and bilateral hilar adenopathy felt to be reactive.  No pulmonary embolus was reported.  Patient was started on IV ceftriaxone and vancomycin.  TTE was performed which showed shaggy vegetations on tricuspid valve.  TEE on 03/15/2020 showed 2 x 2 cm long  vegetation on tricuspid valve with resultant severe TR.  MRI left shoulder showed septic arthropathy, myositis and tenosynovitis.  He underwent washout of left shoulder by orthopedic surgery on 03/15/2020.  He was transferred to [facility] on 03/17/2020 for further evaluation of endocarditis by cardiothoracic surgery.  Subjective:  Awake.  No complaints.  Answered questions regarding PCP follow-up cost of any out-of-pocket prescriptions.  Objective: Vitals:   04/11/20 1933 04/12/20 0442  BP: 110/68 115/78  Pulse: 88 85  Resp:    Temp: 98 F (36.7 C) 97.8 F (36.6 C)  SpO2: 98% 98%    Intake/Output Summary (Last 24 hours) at 04/12/2020 1248 Last data filed at 04/12/2020 0443 Gross per 24 hour  Intake 713.79 ml  Output 1100 ml  Net -386.21 ml   Filed Weights   04/08/20 0451 04/11/20 0631 04/12/20 0442  Weight: 58.8 kg 58 kg 57.8 kg    Exam:  Constitutional: NAD, calm, awake Respiratory: clear to auscultation bilaterally, Normal respiratory effort. RA  Cardiovascular: Regular rate and rhythm, no murmurs / rubs / gallops. No extremity edema. 2+ pedal pulses.  Abdomen: no tenderness, no masses palpated. Bowel sounds positive.  Musculoskeletal: no clubbing / cyanosis. No joint deformity upper and lower extremities. Normal muscle tone.  Neurologic: CN 2-12 grossly intact. Sensation intact, DTR normal. Strength 5/5 x all 4 extremities.  Psychiatric: Normal judgment and insight. Alert and oriented x 3. Normal mood.    Assessment/Plan: Severe sepsis secondary  to MRSA and Serratia bacteremia-pulmonary septic emboli, TV endocarditis, left shoulder septic arthritis.   Patient is status post left shoulder arthroscopy with debridement irrigation for septic arthritis as per orthopedics on 03/15/2020.  Echocardiogram showed tricuspid vegetation.  TEE showed 2x2 mobile vegetation with severe TR.   MRI lumbar spine showed no evidence of epidural abscess.  Patient refused MRI right shoulder ordered  by ID.   CT surgery was consulted - s/p angiovac debridement on 03/21/2020.  Infectious disease following.  Continue IV vancomycin, Rocephin was changed to cefepime per ID. Repeat blood cultures drawn on 9/8 negative.  ID recommended 4 more weeks of IV antibiotic therapy starting from 03/21/2020 with vancomycin and cefepime until 04/22/2020.  Rec to call ID back prior to discharge to reassess whether he requires outpatient regimen. 10/4 became occluded and required the lytics to resolve  TV endocarditis-s/p Angiovac   CT surgery following prn  Insomnia Had successful improvement in insomnia symptoms with use of prn Benadryl  Hyponatremia sodium is stable at 133 as of 9/23 Has been low since last month.   Serum osmolality is 281.  Substance use disorder Ongoing IV heroin use, last use was 3 days prior to admission.   Patient started on Suboxone 8/2 mg twice daily with as needed Suboxone per opiate withdrawal protocol.   Poke with Dr. Erlinda Hong with Suboxone clinic who agreed patient is an appropriate candidate.  He requested that patient be given number to contact [facility] arrange for outpatient follow-up; patient has completed this request. OUD intake assessment completed by telephone. Patient has been scheduled for 04/24/2020 at 9:45 with Dr. Nedra Hai.  HIV antibodies were nonreactive Hepatitis C antibodies were reactive. Hepatitis C genotype 1b.  Patient can follow-up with GI as outpatient for hepatitis C.  Thrombocytopenia Likely due to sepsis.   Platelet count has improved to 136,000  Iron deficiency anemia Hemoglobin stable at 8.4  Patient's hemoglobin dropped after surgery, and was transfused 1 unit PRBC.    Anemia panel obtained on 9/15 showed iron 25, saturation 13%, TIBC 195, B12 440, reticulocyte count percent 6.1, immature fraction 27.8.   9/22 initiated iron TID with twice daily scheduled Colace to prevent iron-induced constipation Haptoglobin is normal at  202 10/1 will repeat anemia panel and if iron remains significantly low would likely benefit from IV iron    Data Reviewed: Basic Metabolic Panel: Recent Labs  Lab 04/09/20 0534 04/12/20 0630  NA  --  134*  K  --  4.1  CL  --  96*  CO2  --  32  GLUCOSE  --  99  BUN  --  25*  CREATININE 0.61 0.64  CALCIUM  --  9.7   Liver Function Tests: No results for input(s): AST, ALT, ALKPHOS, BILITOT, PROT, ALBUMIN in the last 168 hours. No results for input(s): LIPASE, AMYLASE in the last 168 hours. No results for input(s): AMMONIA in the last 168 hours. CBC: No results for input(s): WBC, NEUTROABS, HGB, HCT, MCV, PLT in the last 168 hours. Cardiac Enzymes: No results for input(s): CKTOTAL, CKMB, CKMBINDEX, TROPONINI in the last 168 hours. BNP (last 3 results) No results for input(s): BNP in the last 8760 hours.  ProBNP (last 3 results) No results for input(s): PROBNP in the last 8760 hours.  CBG: No results for input(s): GLUCAP in the last 168 hours.  No results found for this or any previous visit (from the past 240 hour(s)).   Studies: No results found.  Scheduled Meds: . buprenorphine-naloxone  1 tablet Sublingual BID  . Chlorhexidine Gluconate Cloth  6 each Topical Daily  . docusate sodium  100 mg Oral BID  . enoxaparin (LOVENOX) injection  40 mg Subcutaneous Q24H  . feeding supplement (ENSURE ENLIVE)  237 mL Oral TID BM  . ferrous sulfate  325 mg Oral TID WC  . multivitamin with minerals  1 tablet Oral Daily  . sodium chloride flush  10-40 mL Intracatheter Q12H   Continuous Infusions: . ceFEPime (MAXIPIME) IV 2 g (04/12/20 [id number])  . vancomycin 1,000 mg (04/12/20 0313)    Principal Problem:   MRSA bacteremia Active Problems:   Polysubstance dependence (HCC)   Hypokalemia   Thrombocytopenia (HCC)   Serratia septicemia (HCC)   Bipolar 1 disorder (HCC)   Heroin abuse (HCC)   Arthritis, septic, shoulder (HCC)   Endocarditis of tricuspid valve   Hepatitis C  virus infection   IVDU (intravenous drug user)   Normocytic anemia   Cigarette smoker   Septic pulmonary embolism (HCC)   Pressure injury of skin   Lumbar back pain   Consultants:  Orthopedics  CVTS  Infectious disease  Procedures: 9/7 2D echocardiogram: 1. Left ventricular ejection fraction, by estimation, is 60 to 65%. The left ventricle has normal function. The left ventricle has no regional wall motion abnormalities. Left ventricular diastolic function could not be evaluated. 2. Right ventricular systolic function is normal. The right ventricular size is normal. There is normal pulmonary artery systolic pressure. The estimated right ventricular systolic pressure is 31.5 mmHg. 3. The mitral valve is normal in structure. No evidence of mitral valve regurgitation. No evidence of mitral stenosis. 4. There are several large shaggy mobile densities one of which measures 1.27 x 1.54cm on the TV worrisome for vegetations. The tricuspid valve is abnormal. 5. The aortic valve is normal in structure. Aortic valve regurgitation is not visualized. No aortic stenosis is present. 6. The inferior vena cava is normal in size with greater than 50% respiratory variability, suggesting right atrial pressure of 3 mmHg.  9/10 TEE: 1. Tricuspid valve endocarditis. 2. Left ventricular ejection fraction, by estimation, is 60 to 65%. The left ventricle has normal function. 3. Right ventricular systolic function is normal. The right ventricular size is normal. 4. No left atrial/left atrial appendage thrombus was detected. 5. Right atrial size was mildly dilated. 6. A small pericardial effusion is present. The pericardial effusion is circumferential. There is no evidence of cardiac tamponade. 7. The mitral valve is normal in structure. Trivial mitral valve regurgitation. 8. There are 2 separate elongated valvular vegetations along the medial and lateral tricuspid valve leaflets that are approximately 2 cm  in length, residing mostly in the right atrial surface. There is associated severe tricuspid regurgitation.. The tricuspid valve is abnormal. Tricuspid valve regurgitation is severe. 9. The aortic valve is normal in structure. Aortic valve regurgitation is not visualized. 10. There are very small shaggy thin mobile echodensities on the pulmonic valve surface. Trivial pulmonic valve regurgitation. This may represent pulmonic  9/10 left shoulder arthroscopy with extensive debridement, I/D, excisional debridement of the labrum and bursa as well as left shoulder subacromial bursectomy  9/16 application of angio VAC with intraoperative TEE: Right heart cannulation - Debridement of right atrial mass - Debridement of tricuspid valve vegetation  10/4 tPA for occluded PICC  Antibiotics: Anti-infectives (From admission, onward)   Start     Dose/Rate Route Frequency Ordered Stop   04/12/20 0200  vancomycin (VANCOCIN) IVPB 1000 mg/200  mL premix        1,000 mg 200 mL/hr over 60 Minutes Intravenous Every 12 hours 04/11/20 1025     04/01/20 2000  vancomycin (VANCOREADY) IVPB 1500 mg/300 mL  Status:  Discontinued        1,500 mg 150 mL/hr over 120 Minutes Intravenous Every 12 hours 04/01/20 1140 04/11/20 1025   03/22/20 2000  vancomycin (VANCOREADY) IVPB 1250 mg/250 mL  Status:  Discontinued        1,250 mg 166.7 mL/hr over 90 Minutes Intravenous Every 12 hours 03/22/20 1435 04/01/20 1140   03/20/20 1400  ceFEPIme (MAXIPIME) 2 g in sodium chloride 0.9 % 100 mL IVPB        2 g 200 mL/hr over 30 Minutes Intravenous Every 8 hours 03/20/20 0912 04/22/20 2359   03/19/20 2200  vancomycin (VANCOCIN) IVPB 1000 mg/200 mL premix  Status:  Discontinued        1,000 mg 200 mL/hr over 60 Minutes Intravenous Every 12 hours 03/19/20 1258 03/22/20 1435   03/18/20 1400  cefTRIAXone (ROCEPHIN) 2 g in sodium chloride 0.9 % 100 mL IVPB  Status:  Discontinued        2 g 200 mL/hr over 30 Minutes Intravenous Every 24  hours 03/18/20 1105 03/20/20 0909   03/12/20 2000  cefTRIAXone (ROCEPHIN) 2 g in sodium chloride 0.9 % 100 mL IVPB  Status:  Discontinued        2 g 200 mL/hr over 30 Minutes Intravenous Every 24 hours 03/11/20 2058 03/12/20 0937   03/12/20 1400  ceFEPIme (MAXIPIME) 2 g in sodium chloride 0.9 % 100 mL IVPB  Status:  Discontinued        2 g 200 mL/hr over 30 Minutes Intravenous Every 8 hours 03/12/20 0937 03/18/20 1105   03/12/20 0000  vancomycin (VANCOCIN) IVPB 1000 mg/200 mL premix  Status:  Discontinued        1,000 mg 200 mL/hr over 60 Minutes Intravenous Every 8 hours 03/11/20 1816 03/19/20 1258   03/11/20 1830  cefTRIAXone (ROCEPHIN) 2 g in sodium chloride 0.9 % 100 mL IVPB        2 g 200 mL/hr over 30 Minutes Intravenous  Once 03/11/20 1822 03/11/20 2030   03/11/20 1545  vancomycin (VANCOREADY) IVPB 1500 mg/300 mL        1,500 mg 150 mL/hr over 120 Minutes Intravenous  Once 03/11/20 1532 03/11/20 1819       Time spent: 15 minutes    Junious Silk ANP  [facility] Pager [phone]. If 7PM-7AM, please contact night-coverage at [email or web address] 04/12/2020, 12:48 PM  LOS: 32 days

## 2020-04-12 NOTE — Progress Notes (Signed)
Occupational Therapy Treatment Patient Details Name: Robert Kramer MRN: [id number] DOB: 02-Mar-1976 Today's Date: 04/12/2020    History of present illness Pt is a 44 y.o. male admitted 03/11/20 for evaluation of abnormal blood cultures. Chest CTA shows multiple cavitary and noncavitary nodules consistent with septic emboli; bilateral PNA, bilateral hilar adenopathy. 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 Pt with L shoulder septic arthritis s/p I&D 9/10. S/p catheter-based debridement of tricuspid valve 9/16. PMH includes IVDA, bipolar disorder.  Underwent AngioVac procedure 9/16.   OT comments  Patient feeling better, given med for nausea.  Nurse placed patient on portable monitor this date.  Decreased activity tolerance and fair dynamic stand balance, and central line impact ADL and mobility independence.  OT frequency dropped to 1x/wk for HEP compliance to L shoulder.    Follow Up Recommendations  Supervision - Intermittent;No OT follow up    Equipment Recommendations    none   Recommendations for Other Services      Precautions / Restrictions Precautions Precautions: Fall Precaution Comments: Contact precautions Restrictions Weight Bearing Restrictions: No LUE Weight Bearing: Weight bearing as tolerated LLE Weight Bearing: Weight bearing as tolerated Other Position/Activity Restrictions: AROM as tolerated.  No restrictions to L shoulder       Mobility Bed Mobility Overal bed mobility: Independent       Supine to sit: Independent Sit to supine: Independent   General bed mobility comments: exercise focus  Transfers Overall transfer level: Needs assistance   Transfers: Sit to/from Stand Sit to Stand: Modified independent (Device/Increase time)         General transfer comment: Patient completed HEP to L shoulder with PT.  OT focused on in hall mobility for increased acitivty tolerance for ADL and improved dynamic balance.    Balance Overall  balance assessment: Needs assistance Sitting-balance support: No upper extremity supported;Feet supported Sitting balance-Leahy Scale: Good     Standing balance support: No upper extremity supported Standing balance-Leahy Scale: Fair Standing balance comment: patient had one knee buckle and weaving noted with reported fatigue.                            Praxis      Cognition Arousal/Alertness: Awake/alert   Overall Cognitive Status: Within Functional Limits for tasks assessed                                 General Comments: requested shoulder exercises due to severe nausea this afternoon                          Pertinent Vitals/ Pain       Pain Assessment: Faces Faces Pain Scale: Hurts little more Pain Location: L shld Pain Descriptors / Indicators: Aching Pain Intervention(s): Monitored during session                                                          Frequency  Min 1X/week        Progress Toward Goals  OT Goals(current goals can now be found in the care plan section)  Progress towards OT goals: Progressing toward goals  Acute Rehab OT Goals Patient Stated  Goal: Patient is anxious to go home. OT Goal Formulation: With patient Time For Goal Achievement: 04/26/20 Potential to Achieve Goals: Good ADL Goals Pt Will Transfer to Toilet: Independently  Plan Frequency needs to be updated    Co-evaluation                 AM-PAC OT "6 Clicks" Daily Activity     Outcome Measure   Help from another person eating meals?: None Help from another person taking care of personal grooming?: None Help from another person toileting, which includes using toliet, bedpan, or urinal?: None Help from another person bathing (including washing, rinsing, drying)?: A Little Help from another person to put on and taking off regular upper body clothing?: None Help from another person to put on and taking off  regular lower body clothing?: A Little 6 Click Score: 22    End of Session Equipment Utilized During Treatment: Gait belt  OT Visit Diagnosis: Unsteadiness on feet (R26.81);Muscle weakness (generalized) (M62.81);Pain Pain - Right/Left: Left Pain - part of body: Shoulder   Activity Tolerance Patient tolerated treatment well   Patient Left in bed;with call bell/phone within reach   Nurse Communication          Time: 1700-1718 OT Time Calculation (min): 18 min  Charges: OT General Charges $OT Visit: 1 Visit OT Treatments $Therapeutic Activity: 8-22 mins  04/12/2020  Rich, OTR/L  Acute Rehabilitation Services  Office:  [phone]    Robert Kramer 04/12/2020, 5:26 PM

## 2020-04-12 NOTE — Progress Notes (Signed)
Physical Therapy Treatment Patient Details Name: Robert Kramer MRN: [id number] DOB: [birth year] Today's Date: 04/12/2020    History of Present Illness Pt is a 44 y.o. male admitted 03/11/20 for evaluation of abnormal blood cultures. Chest CTA shows multiple cavitary and noncavitary nodules consistent with septic emboli; bilateral PNA, bilateral hilar adenopathy. 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 Pt with L shoulder septic arthritis s/p I&D 9/10. S/p catheter-based debridement of tricuspid valve 9/16. PMH includes IVDA, bipolar disorder.  Underwent AngioVac procedure 9/16.    PT Comments    Patient received in bed, very pleasant and politely declining OOB activity/gait due to severe nausea this afternoon, requests that therapist work on L shoulder as it has been bothering him all day. Focused session on bed level activities for shoulder including stretches into flexion gradually increasing to 90 degrees, stretches into abduction gradually increasing to 90 degrees, full range IR, and  Stretches into approximately 30 degrees of ER at 30 degrees abduction. Also worked on [employer] in flexion and abduction but limited by pain when he got close to 90 degrees of range in both directions. Left in bed with all needs met, bed alarm active, RN aware of request for nausea medication.   Follow Up Recommendations  Home health PT;Supervision/Assistance - 24 hour     Equipment Recommendations  Rolling walker with 5" wheels    Recommendations for Other Services       Precautions / Restrictions Precautions Precautions: Fall Restrictions Weight Bearing Restrictions: No LUE Weight Bearing: Weight bearing as tolerated LLE Weight Bearing: Weight bearing as tolerated Other Position/Activity Restrictions: AROM as tolerated.  No restrictions to L shoulder    Mobility  Bed Mobility               General bed mobility comments: exercise focus  Transfers                  General transfer comment: exercise focus  Ambulation/Gait             General Gait Details: exercise focus   Stairs             Wheelchair Mobility    Modified Rankin (Stroke Patients Only)       Balance Overall balance assessment: Needs assistance Sitting-balance support: No upper extremity supported;Feet supported Sitting balance-Leahy Scale: Good     Standing balance support: Single extremity supported Standing balance-Leahy Scale: Fair                              Cognition Arousal/Alertness: Awake/alert   Overall Cognitive Status: Within Functional Limits for tasks assessed                                 General Comments: requested shoulder exercises due to severe nausea this afternoon      Exercises      General Comments        Pertinent Vitals/Pain Pain Assessment: Faces Faces Pain Scale: Hurts whole lot Pain Location: L shld Pain Descriptors / Indicators: Aching;Sharp Pain Intervention(s): Limited activity within patient's tolerance;Monitored during session    Home Living                      Prior Function            PT Goals (current goals can now be found in  the care plan section) Acute Rehab PT Goals Patient Stated Goal: for arm to heal PT Goal Formulation: With patient Time For Goal Achievement: 04/15/20 Potential to Achieve Goals: Good Progress towards PT goals: Progressing toward goals    Frequency    Min 3X/week      PT Plan Current plan remains appropriate    Co-evaluation              AM-PAC PT "6 Clicks" Mobility   Outcome Measure  Help needed turning from your back to your side while in a flat bed without using bedrails?: None Help needed moving from lying on your back to sitting on the side of a flat bed without using bedrails?: None Help needed moving to and from a bed to a chair (including a wheelchair)?: None Help needed standing up from a chair using your  arms (e.g., wheelchair or bedside chair)?: None Help needed to walk in hospital room?: A Little Help needed climbing 3-5 steps with a railing? : A Little 6 Click Score: 22    End of Session   Activity Tolerance: Other (comment);Patient tolerated treatment well (limited by nausea) Patient left: in bed;with call bell/phone within reach;with bed alarm set Nurse Communication: Other (comment) (severe nausea, asking for nausea meds) PT Visit Diagnosis: Difficulty in walking, not elsewhere classified (R26.2);Other abnormalities of gait and mobility (R26.89)     Time: 1425-1445 PT Time Calculation (min) (ACUTE ONLY): 20 min  Charges:  $Therapeutic Exercise: 8-22 mins                     Windell Norfolk, DPT, PN1   Supplemental Physical Therapist [facility]    Pager [phone] Acute Rehab Office [phone]

## 2020-04-13 NOTE — Progress Notes (Signed)
PROGRESS NOTE    Robert Kramer  BJS:[id number] DOB: Nov 02, 1975 DOA: 03/11/2020 PCP: Patient, No Pcp Per    Brief Narrative:  Robert Kramer has been admitted with a working diagnosis of severe sepsis secondary to tricuspid valve endocarditis, left shoulder septic arthritis, septic pulmonary emboli, MRSA and Serratia bacteremia.  He had a prolonged hospital stay due to unsafe discharge, plan to complete antibiotic therapy during his hospitalization.  44 year old male with past medical history for polysubstance abuse, and bipolar disorder.  Patient was seen in the ED 03/06/2020 for heroin withdrawal.  He received supportive medical therapy, and had blood cultures drawn.  He was discharged home in a stable condition from the ED.  His cultures were positive for Serratia and MRSA and he was called to come back to the hospital. At home patient had fevers, chills, diaphoresis, dyspnea, nonproductive cough, nausea, abdominal pain and not feeling well in general.  He continued to use IV drugs. On his initial physical examination his temperature was 98.1, blood pressure 121/73, heart rate 125 and respirate of 24.  His lungs had bibasilar rales, heart S1-S2, present rhythmic, positive systolic murmur at the left sternal border, abdomen soft, no lower extremity edema.  He was started on broad-spectrum antibiotic therapy with ceftriaxone and vancomycin.  Further work-up with CT chest revealed pulmonary embolism, cavitary noncavitary nodules consistent with septic emboli.  Bilateral pneumonia and bilateral hilar adenopathy. Transthoracic echocardiography showed vegetation on the tricuspid valve.  Transesophageal echocardiography confirmed 2 x 2 cm long vegetation on the tricuspid valve with resultant severe tricuspid regurgitation.  MRI of his left shoulder shows septic arthropathy, myositis and tenosynovitis.  Orthopedics were consulted and he underwent left shoulder washout on 9/10.  He was initially admitted to  [facility], on 9/12 transferred to [facility] for further management of endocarditis.  Patient was evaluated by CT surgery, and he underwent debridement of right atrial mass and debridement of tricuspid valve vegetation on 9/16.  Infectious disease has recommended continue antibiotic therapy until 04/22/2020 with vancomycin and cefepime.  Assessment & Plan:   Principal Problem:   MRSA bacteremia Active Problems:   Polysubstance dependence (HCC)   Hypokalemia   Thrombocytopenia (HCC)   Serratia septicemia (HCC)   Bipolar 1 disorder (HCC)   Heroin abuse (HCC)   Arthritis, septic, shoulder (HCC)   Endocarditis of tricuspid valve   Hepatitis C virus infection   IVDU (intravenous drug user)   Normocytic anemia   Cigarette smoker   Septic pulmonary embolism (HCC)   Pressure injury of skin   Lumbar back pain   1. Severe sepsis due to tricuspid valve endocarditis, (present on admission), MRSA and serratia. Patient had septic emboli complication with left shoulder septic arthritis and septic pulmonary embolism.   Tolerating well antibiotic therapy with vancomycin and cefepime, plan to complete in the hospital on 10.18.21.  Patient is unsafe discharge due to IV drug use.   2. Anemia of iron deficiency/ thrombocytopenia. Sp one units prbc transfusion.  Continue with iron supplementation.   3. Substance abuse. Patient will continue with suboxone at discharge follow up with Dr. Para March   4. Hepatitis C. Chronic infection, plan to follow up as outpatient, no clinical signs of liver failure.     Status is: Inpatient  Remains inpatient appropriate because:Unsafe d/c plan and IV treatments appropriate due to intensity of illness or inability to take PO   Dispo: The patient is from: Home  Anticipated d/c is to: Home              Anticipated d/c date is: > 3 days              Patient currently is not medically stable to d/c. plan for dc home when completed antibiotic  therapy on 10.18.21    DVT prophylaxis:  enoxaparin   Code Status:   full  Family Communication:  No family at the bedside      Nutrition Status: Nutrition Problem: Increased nutrient needs Etiology: acute illness (sepsis) Signs/Symptoms: estimated needs Interventions: Ensure Enlive (each supplement provides 350kcal and 20 grams of protein), MVI     Skin Documentation: Pressure Injury 03/19/20 Coccyx Mid Stage 2 -  Partial thickness loss of dermis presenting as a shallow open injury with a red, pink wound bed without slough. (Active)  03/19/20 2100  Location: Coccyx  Location Orientation: Mid  Staging: Stage 2 -  Partial thickness loss of dermis presenting as a shallow open injury with a red, pink wound bed without slough.  Wound Description (Comments):   Present on Admission: No     Pressure Injury 04/02/20 Ankle Right Stage 2 -  Partial thickness loss of dermis presenting as a shallow open injury with a red, pink wound bed without slough. (Active)  04/02/20 0730  Location: Ankle  Location Orientation: Right  Staging: Stage 2 -  Partial thickness loss of dermis presenting as a shallow open injury with a red, pink wound bed without slough.  Wound Description (Comments):   Present on Admission:      Consultants:   ID   Vascular surgery    Procedures:  debridement of right atrial mass and debridement of tricuspid valve vegetation on 9/16  Antimicrobials:   Vancomycin and cefepime    Subjective: Patient is feeling well, no nausea or vomiting, no diarrhea no chest pain or dyspnea,   Objective: Vitals:   04/12/20 0442 04/12/20 1500 04/12/20 2011 04/13/20 1346  BP: 115/78 109/72 110/74 101/70  Pulse: 85 89 85 81  Resp:  14 18 18   Temp: 97.8 F (36.6 C) 98.4 F (36.9 C) 98.4 F (36.9 C) 98.1 F (36.7 C)  TempSrc: Oral Oral Oral Oral  SpO2: 98% 97% 98% 98%  Weight: 57.8 kg     Height:        Intake/Output Summary (Last 24 hours) at 04/13/2020 1530 Last  data filed at 04/13/2020 1321 Gross per 24 hour  Intake 240 ml  Output 1550 ml  Net -1310 ml   Filed Weights   04/08/20 0451 04/11/20 0631 04/12/20 0442  Weight: 58.8 kg 58 kg 57.8 kg    Examination:   General: Not in pain or dyspnea. Deconditioned  Neurology: Awake and alert, non focal  E ENT: mild pallor, no icterus, oral mucosa moist Cardiovascular: No JVD. S1-S2 present, rhythmic, no gallops, rubs, or murmurs. No lower extremity edema. Pulmonary: positive breath sounds bilaterally, adequate air movement, no wheezing, rhonchi or rales. Gastrointestinal. Abdomen soft and non tender Skin. No rashes Musculoskeletal: no joint deformities     Data Reviewed: I have personally reviewed following labs and imaging studies  CBC: No results for input(s): WBC, NEUTROABS, HGB, HCT, MCV, PLT in the last 168 hours. Basic Metabolic Panel: Recent Labs  Lab 04/09/20 0534 04/12/20 0630  NA  --  134*  K  --  4.1  CL  --  96*  CO2  --  32  GLUCOSE  --  99  BUN  --  25*  CREATININE 0.61 0.64  CALCIUM  --  9.7   GFR: Estimated Creatinine Clearance: 94.3 mL/min (by C-G formula based on SCr of 0.64 mg/dL). Liver Function Tests: No results for input(s): AST, ALT, ALKPHOS, BILITOT, PROT, ALBUMIN in the last 168 hours. No results for input(s): LIPASE, AMYLASE in the last 168 hours. No results for input(s): AMMONIA in the last 168 hours. Coagulation Profile: No results for input(s): INR, PROTIME in the last 168 hours. Cardiac Enzymes: No results for input(s): CKTOTAL, CKMB, CKMBINDEX, TROPONINI in the last 168 hours. BNP (last 3 results) No results for input(s): PROBNP in the last 8760 hours. HbA1C: No results for input(s): HGBA1C in the last 72 hours. CBG: No results for input(s): GLUCAP in the last 168 hours. Lipid Profile: No results for input(s): CHOL, HDL, LDLCALC, TRIG, CHOLHDL, LDLDIRECT in the last 72 hours. Thyroid Function Tests: No results for input(s): TSH, T4TOTAL,  FREET4, T3FREE, THYROIDAB in the last 72 hours. Anemia Panel: No results for input(s): VITAMINB12, FOLATE, FERRITIN, TIBC, IRON, RETICCTPCT in the last 72 hours.    Radiology Studies: I have reviewed all of the imaging during this hospital visit personally     Scheduled Meds: . buprenorphine-naloxone  1 tablet Sublingual BID  . Chlorhexidine Gluconate Cloth  6 each Topical Daily  . docusate sodium  100 mg Oral BID  . enoxaparin (LOVENOX) injection  40 mg Subcutaneous Q24H  . feeding supplement (ENSURE ENLIVE)  237 mL Oral TID BM  . ferrous sulfate  325 mg Oral TID WC  . multivitamin with minerals  1 tablet Oral Daily  . sodium chloride flush  10-40 mL Intracatheter Q12H   Continuous Infusions: . ceFEPime (MAXIPIME) IV 2 g (04/13/20 1321)  . vancomycin 1,000 mg (04/13/20 1423)     LOS: 33 days        Ezekiah Massie Annett Gula, MD

## 2020-04-14 DIAGNOSIS — L89302 Pressure ulcer of unspecified buttock, stage 2: Secondary | ICD-10-CM

## 2020-04-14 DIAGNOSIS — F17210 Nicotine dependence, cigarettes, uncomplicated: Secondary | ICD-10-CM

## 2020-04-14 LAB — CREATININE, SERUM
Creatinine, Ser: 0.67 mg/dL (ref 0.61–1.24)
GFR, Estimated: >60 mL/min (ref >60)

## 2020-04-14 NOTE — Progress Notes (Signed)
Pharmacy Antibiotic Note  Robert Kramer is a 44 y.o. 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 Angiovac procedure completed 9/16. Additionally, patient has L shoulder septic arthritis s/p I&D 9/10 with culture growing rare MRSA. Pharmacy has been consulted for cefepime dosing for Serratia bacteremia/endocarditis (given ampC organism).  Plans to continue antibiotics until 04/22/20. -SCr= 0.67 on 10/10 (CrCl ~94 mL/min) -WBC wnl, afebrile -10/7 vancomycin trough = 28 (collected prior to dose) on 1500mg  IV q12h, decreased to 1000 mg IV q12h  Plan: Continue cefepime 2g q8 hours Continue vancomycin to 1000mg  IV q12h Consider vancomycin trough in 2-3 days to assess steady state  Height: 5\' 11"  (180.3 cm) Weight: kg (127 lb 6.8 oz) IBW/kg (Calculated) : 75.3  Temp (24hrs), Avg:98.4 F (36.9 C), Min:98.1 F (36.7 C), Max:98.9 F (37.2 C)  Recent Labs  Lab 04/09/20 0534 04/11/20 0738 04/12/20 0630 04/14/20 0620  CREATININE 0.61  --  0.64 0.67  VANCOTROUGH  --  28*  --   --     Estimated Creatinine Clearance: 94.3 mL/min (by C-G formula based on SCr of 0.67 mg/dL).    Allergies  Allergen Reactions  . Tramadol Other (See Comments)    Upset stomach    Antimicrobials this admission: Vancomycin 9/6 >> (10/18) Ceftriaxone 9/6 >>9/7, 9/13>>9/15 Cefepime 9/7 >>9/13, 9/15>>(10/18)  Microbiology results: 9/1 BCx x1: MRSA 9/1 BCx x1: Serratia Marcescens (S=Rocephin, Bactrim, Cipro; R=Cefazolin) 9/6 UCx: mult sp F 9/6 BCx: MRSA F 9/6 COVID: neg 9/8 Hep A reactive 9/8 Hep B NR 9/8 Hep C reactive 9/8 BCx2 ng 9/10 MRSA PCR neg MSSA PCR neg 9/10 L shoulder synovial fluid: rare SA 9/16 tissue/veg: GPC - reincubate 9/25 fungus neg, culture w/ stain pending   Thank you for allowing pharmacy to be a part of this patient's care.  11/10, PharmD Clinical Pharmacist **Pharmacist phone directory can now be  found on [email or web address] ([email or web address]).  Listed under [facility].

## 2020-04-14 NOTE — Progress Notes (Signed)
PROGRESS NOTE    Robert Kramer  NOM:[id number] DOB: 11-06-1975 DOA: 03/11/2020 PCP: Patient, No Pcp Per    Brief Narrative:  Mr. Zeiner has been admitted with a working diagnosis of severe sepsis secondary to tricuspid valve endocarditis, left shoulder septic arthritis, septic pulmonary emboli, MRSA and Serratia bacteremia.  He had a prolonged hospital stay due to unsafe discharge, plan to complete antibiotic therapy during his hospitalization.  44 year old male with past medical history for polysubstance abuse, and bipolar disorder.  Patient was seen in the ED 03/06/2020 for heroin withdrawal.  He received supportive medical therapy, and had blood cultures drawn.  He was discharged home in a stable condition from the ED.  His cultures were positive for Serratia and MRSA and he was called to come back to the hospital. At home patient had fevers, chills, diaphoresis, dyspnea, nonproductive cough, nausea, abdominal pain and not feeling well in general.  He continued to use IV drugs. On his initial physical examination his temperature was 98.1, blood pressure 121/73, heart rate 125 and respirate of 24.  His lungs had bibasilar rales, heart S1-S2, present rhythmic, positive systolic murmur at the left sternal border, abdomen soft, no lower extremity edema.  He was started on broad-spectrum antibiotic therapy with ceftriaxone and vancomycin.  Further work-up with CT chest revealed pulmonary embolism, cavitary noncavitary nodules consistent with septic emboli.  Bilateral pneumonia and bilateral hilar adenopathy. Transthoracic echocardiography showed vegetation on the tricuspid valve.  Transesophageal echocardiography confirmed 2 x 2 cm long vegetation on the tricuspid valve with resultant severe tricuspid regurgitation.  MRI of his left shoulder shows septic arthropathy, myositis and tenosynovitis.  Orthopedics were consulted and he underwent left shoulder washout on 9/10.  He was initially admitted  to [facility], on 9/12 transferred to [facility] for further management of endocarditis.  Patient was evaluated by CT surgery, and he underwent debridement of right atrial mass and debridement of tricuspid valve vegetation on 9/16.  Infectious disease has recommended continue antibiotic therapy until 04/22/2020 with vancomycin and cefepime.    Assessment & Plan:   Principal Problem:   MRSA bacteremia Active Problems:   Polysubstance dependence (HCC)   Hypokalemia   Thrombocytopenia (HCC)   Serratia septicemia (HCC)   Bipolar 1 disorder (HCC)   Heroin abuse (HCC)   Arthritis, septic, shoulder (HCC)   Endocarditis of tricuspid valve   Hepatitis C virus infection   IVDU (intravenous drug user)   Normocytic anemia   Cigarette smoker   Septic pulmonary embolism (HCC)   Pressure injury of skin   Lumbar back pain   1. Severe sepsis due to tricuspid valve endocarditis, (present on admission), MRSA and serratia. Patient had septic emboli complication with left shoulder septic arthritis and septic pulmonary embolism.   Continue with current antibiotic therapy with vancomycin and cefepime per ID recommendations.  Scheduled to complete in the hospital on 10.18.21. Continue to be unsafe discharge due to IV drug abuse.   2. Anemia of iron deficiency/ thrombocytopenia. Sp one units prbc transfusion.  On po iron supplementation.   3. Substance abuse/ tobacco abuse. Tolerating well suboxone that he will continue at discharge, plan for follow up with Dr. Para March   4. Hepatitis C. Chronic infection, plan to follow up as outpatient, no clinical signs of liver failure.   5. Coccyx and right heal pressure ulcer stage 2. Not present on admission. Continue local skin care, continue to encourage mobility.   6. Hypokalemia. Electrolytes have been corrected. Patient is tolerating  po well.      Status is: Inpatient  Remains inpatient appropriate because:IV treatments appropriate due to  intensity of illness or inability to take PO   Dispo: The patient is from: Home              Anticipated d/c is to: Home              Anticipated d/c date is: > 3 days              Patient currently is not medically stable to d/c.   DVT prophylaxis: Enoxaparin   Code Status:   full  Family Communication:  No family at the bedside      Nutrition Status: Nutrition Problem: Increased nutrient needs Etiology: acute illness (sepsis) Signs/Symptoms: estimated needs Interventions: Ensure Enlive (each supplement provides 350kcal and 20 grams of protein), MVI     Skin Documentation: Pressure Injury 03/19/20 Coccyx Mid Stage 2 -  Partial thickness loss of dermis presenting as a shallow open injury with a red, pink wound bed without slough. (Active)  03/19/20 2100  Location: Coccyx  Location Orientation: Mid  Staging: Stage 2 -  Partial thickness loss of dermis presenting as a shallow open injury with a red, pink wound bed without slough.  Wound Description (Comments):   Present on Admission: No     Pressure Injury 04/02/20 Ankle Right Stage 2 -  Partial thickness loss of dermis presenting as a shallow open injury with a red, pink wound bed without slough. (Active)  04/02/20 0730  Location: Ankle  Location Orientation: Right  Staging: Stage 2 -  Partial thickness loss of dermis presenting as a shallow open injury with a red, pink wound bed without slough.  Wound Description (Comments):   Present on Admission:      Consultants:   ID   Vascular surgery    Procedures:  debridement of right atrial mass and debridement of tricuspid valve vegetation on 9/16  Antimicrobials:   Vancomycin and cefepime     Subjective: Patient has remained stable, no withdrawal symptoms, no chest pain or dsypnea, no nausea, vomiting, or diarrhea.   Objective: Vitals:   04/12/20 2011 04/13/20 1346 04/13/20 2052 04/14/20 0445  BP: 110/74 101/70 111/81 98/78  Pulse: 85 81 86 76  Resp:  18 18 20 18   Temp: 98.4 F (36.9 C) 98.1 F (36.7 C) 98.9 F (37.2 C) 98.1 F (36.7 C)  TempSrc: Oral Oral Oral Oral  SpO2: 98% 98% 98% 98%  Weight:      Height:        Intake/Output Summary (Last 24 hours) at 04/14/2020 1056 Last data filed at 04/14/2020 1000 Gross per 24 hour  Intake 1506.28 ml  Output 1725 ml  Net -218.72 ml   Filed Weights   04/08/20 0451 04/11/20 0631 04/12/20 0442  Weight: 58.8 kg 58 kg 57.8 kg    Examination:   General: Not in pain or dyspnea. Deconditioned  Neurology: Awake and alert, non focal  E ENT: mild pallor, no icterus, oral mucosa moist Cardiovascular: No JVD. S1-S2 present, rhythmic, Pulmonary: positive breath sounds bilaterally, adequate air movement, no wheezing, rhonchi or rales. Gastrointestinal. Abdomen soft and non tender Skin. No rashes Musculoskeletal: no joint deformities     Data Reviewed: I have personally reviewed following labs and imaging studies  CBC: No results for input(s): WBC, NEUTROABS, HGB, HCT, MCV, PLT in the last 168 hours. Basic Metabolic Panel: Recent Labs  Lab 04/09/20 0534 04/12/20 0630 04/14/20 06/14/20  NA  --  134*  --   K  --  4.1  --   CL  --  96*  --   CO2  --  32  --   GLUCOSE  --  99  --   BUN  --  25*  --   CREATININE 0.61 0.64 0.67  CALCIUM  --  9.7  --    GFR: Estimated Creatinine Clearance: 94.3 mL/min (by C-G formula based on SCr of 0.67 mg/dL). Liver Function Tests: No results for input(s): AST, ALT, ALKPHOS, BILITOT, PROT, ALBUMIN in the last 168 hours. No results for input(s): LIPASE, AMYLASE in the last 168 hours. No results for input(s): AMMONIA in the last 168 hours. Coagulation Profile: No results for input(s): INR, PROTIME in the last 168 hours. Cardiac Enzymes: No results for input(s): CKTOTAL, CKMB, CKMBINDEX, TROPONINI in the last 168 hours. BNP (last 3 results) No results for input(s): PROBNP in the last 8760 hours. HbA1C: No results for input(s): HGBA1C in the last  72 hours. CBG: No results for input(s): GLUCAP in the last 168 hours. Lipid Profile: No results for input(s): CHOL, HDL, LDLCALC, TRIG, CHOLHDL, LDLDIRECT in the last 72 hours. Thyroid Function Tests: No results for input(s): TSH, T4TOTAL, FREET4, T3FREE, THYROIDAB in the last 72 hours. Anemia Panel: No results for input(s): VITAMINB12, FOLATE, FERRITIN, TIBC, IRON, RETICCTPCT in the last 72 hours.    Radiology Studies: I have reviewed all of the imaging during this hospital visit personally     Scheduled Meds: . buprenorphine-naloxone  1 tablet Sublingual BID  . Chlorhexidine Gluconate Cloth  6 each Topical Daily  . docusate sodium  100 mg Oral BID  . enoxaparin (LOVENOX) injection  40 mg Subcutaneous Q24H  . feeding supplement (ENSURE ENLIVE)  237 mL Oral TID BM  . ferrous sulfate  325 mg Oral TID WC  . multivitamin with minerals  1 tablet Oral Daily  . sodium chloride flush  10-40 mL Intracatheter Q12H   Continuous Infusions: . ceFEPime (MAXIPIME) IV Stopped (04/14/20 0700)  . vancomycin Stopped (04/14/20 0241)     LOS: 34 days        Romi Rathel Annett Gula, MD

## 2020-04-15 NOTE — Progress Notes (Signed)
PROGRESS NOTE    Robert Kramer  FGH:[id number] DOB: 1975-09-04 DOA: 03/11/2020 PCP: Patient, No Pcp Per    Brief Narrative:  Robert Kramer has been admitted with a working diagnosis of severe sepsis secondary to tricuspid valve endocarditis, left shoulder septic arthritis, septic pulmonary emboli, MRSA and Serratia bacteremia.  He had a prolonged hospital stay due to unsafe discharge, plan to complete antibiotic therapy during his hospitalization.  44 year old male with past medical history for polysubstance abuse, and bipolar disorder. Patient was seen in the ED 03/06/2020 for heroin withdrawal. He received supportive medical therapy, and had blood cultures drawn. He was discharged home in a stable condition from the ED. His cultures were positive for Serratia and MRSA and he was called to come back to the hospital. At home patient had fevers, chills, diaphoresis, dyspnea, nonproductive cough, nausea, abdominal pain and not feeling well in general. He continued to use IV drugs. On his initial physical examination his temperature was 98.1, blood pressure 121/73, heart rate 125 and respirate of 24. His lungs had bibasilar rales, heart S1-S2, present rhythmic, positive systolic murmur at the left sternal border, abdomen soft, no lower extremity edema.  He was started on broad-spectrum antibiotic therapy with ceftriaxone and vancomycin.  Further work-up with CT chest revealed pulmonary embolism, cavitary noncavitary nodules consistent with septic emboli. Bilateral pneumonia and bilateral hilar adenopathy. Transthoracic echocardiography showed vegetation on the tricuspid valve. Transesophageal echocardiography confirmed 2 x 2 cm long vegetation on the tricuspid valve with resultant severe tricuspid regurgitation.  MRI of his left shoulder shows septic arthropathy, myositis and tenosynovitis. Orthopedics were consulted and he underwent left shoulder washout on 9/10.  He was initially admitted  to [facility], on 9/12 transferred to [facility] for further management of endocarditis.  Patient was evaluated by CT surgery, and he underwent debridement of right atrial mass and debridement of tricuspid valve vegetation on 9/16.  Infectious disease has recommended continue antibiotic therapy until 04/22/2020 with vancomycin and cefepime.    Assessment & Plan:   Principal Problem:   MRSA bacteremia Active Problems:   Polysubstance dependence (HCC)   Hypokalemia   Thrombocytopenia (HCC)   Serratia septicemia (HCC)   Bipolar 1 disorder (HCC)   Heroin abuse (HCC)   Arthritis, septic, shoulder (HCC)   Endocarditis of tricuspid valve   Hepatitis C virus infection   IVDU (intravenous drug user)   Normocytic anemia   Cigarette smoker   Septic pulmonary embolism (HCC)   Pressure injury of skin   Lumbar back pain    1. Severe sepsis due to tricuspid valve endocarditis, (present on admission), MRSA and serratia. Patient had septic emboli complication with left shoulder septic arthritis and septic pulmonary embolism.   Tolerating well antibiotic therapy with vancomycin and cefepime per ID recommendations, plan to complete in the hospital on 10.18.21.  Continue to be unsafe for outpatient antibiotic therapy due to history of IV drug abuse.  2. Anemia of iron deficiency/ thrombocytopenia. Sp one units prbc transfusion.  Tolerating well po iron, no constipation.    3. Substance abuse/ tobacco abuse. continue with suboxone for pain control and opioid dependence.  Plan to follow up in the outpatient clinic with Dr. Para March   4. Hepatitis C. Chronic infection, plan to follow up as outpatient, no clinical signs of liver failure.  5. Coccyx and right heal pressure ulcer stage 2. Not present on admission. Out of bed to chair as tolerated, continue with wound care.   6. Hypokalemia. Resolved, patient  is tolerating po well.    Status is: Inpatient  Remains inpatient  appropriate because:IV treatments appropriate due to intensity of illness or inability to take PO   Dispo: The patient is from: Home              Anticipated d/c is to: Home              Anticipated d/c date is: > 3 days              Patient currently is not medically stable to d/c.   DVT prophylaxis: Enoxaparin   Code Status:   full  Family Communication:  No family at the bedside      Nutrition Status: Nutrition Problem: Increased nutrient needs Etiology: acute illness (sepsis) Signs/Symptoms: estimated needs Interventions: Ensure Enlive (each supplement provides 350kcal and 20 grams of protein), MVI     Skin Documentation: Pressure Injury 03/19/20 Coccyx Mid Stage 2 -  Partial thickness loss of dermis presenting as a shallow open injury with a red, pink wound bed without slough. (Active)  03/19/20 2100  Location: Coccyx  Location Orientation: Mid  Staging: Stage 2 -  Partial thickness loss of dermis presenting as a shallow open injury with a red, pink wound bed without slough.  Wound Description (Comments):   Present on Admission: No     Pressure Injury 04/02/20 Ankle Right Stage 2 -  Partial thickness loss of dermis presenting as a shallow open injury with a red, pink wound bed without slough. (Active)  04/02/20 0730  Location: Ankle  Location Orientation: Right  Staging: Stage 2 -  Partial thickness loss of dermis presenting as a shallow open injury with a red, pink wound bed without slough.  Wound Description (Comments):   Present on Admission:     Consultants:  ID   Vascular surgery   Procedures: debridement of right atrial mass and debridement of tricuspid valve vegetation on 9/16  Antimicrobials:  Vancomycin and cefepime  Subjective: Patient feeling well this am, he is seating at the edge of the bed. Denies any nausea, vomiting, constipation or diarrhea, no chest pain or dyspnea,   Objective: Vitals:   04/14/20 1341 04/14/20 1906 04/14/20  2252 04/15/20 0550  BP: 108/74 107/80 109/74   Pulse: 78     Resp: 15 16 16 18   Temp: (!) 97.5 F (36.4 C) 98.5 F (36.9 C) 98.5 F (36.9 C) 98.6 F (37 C)  TempSrc: Oral Oral Oral Oral  SpO2: 99% 99% 96%   Weight:    60.4 kg  Height:        Intake/Output Summary (Last 24 hours) at 04/15/2020 1213 Last data filed at 04/15/2020 0201 Gross per 24 hour  Intake 840 ml  Output 1575 ml  Net -735 ml   Filed Weights   04/11/20 0631 04/12/20 0442 04/15/20 0550  Weight: 58 kg 57.8 kg 60.4 kg    Examination:   General: Not in pain or dyspnea, deconditioned  Neurology: Awake and alert, non focal  E ENT: no pallor, no icterus, oral mucosa moist Cardiovascular: No JVD. S1-S2 present, rhythmic,  Pulmonary: positive breath sounds bilaterally, Gastrointestinal. Abdomen soft and non tender Skin. No rashes Musculoskeletal: no joint deformities     Data Reviewed: I have personally reviewed following labs and imaging studies  CBC: No results for input(s): WBC, NEUTROABS, HGB, HCT, MCV, PLT in the last 168 hours. Basic Metabolic Panel: Recent Labs  Lab 04/09/20 0534 04/12/20 0630 04/14/20 0620  NA  --  134*  --   K  --  4.1  --   CL  --  96*  --   CO2  --  32  --   GLUCOSE  --  99  --   BUN  --  25*  --   CREATININE 0.61 0.64 0.67  CALCIUM  --  9.7  --    GFR: Estimated Creatinine Clearance: 98.6 mL/min (by C-G formula based on SCr of 0.67 mg/dL). Liver Function Tests: No results for input(s): AST, ALT, ALKPHOS, BILITOT, PROT, ALBUMIN in the last 168 hours. No results for input(s): LIPASE, AMYLASE in the last 168 hours. No results for input(s): AMMONIA in the last 168 hours. Coagulation Profile: No results for input(s): INR, PROTIME in the last 168 hours. Cardiac Enzymes: No results for input(s): CKTOTAL, CKMB, CKMBINDEX, TROPONINI in the last 168 hours. BNP (last 3 results) No results for input(s): PROBNP in the last 8760 hours. HbA1C: No results for input(s):  HGBA1C in the last 72 hours. CBG: No results for input(s): GLUCAP in the last 168 hours. Lipid Profile: No results for input(s): CHOL, HDL, LDLCALC, TRIG, CHOLHDL, LDLDIRECT in the last 72 hours. Thyroid Function Tests: No results for input(s): TSH, T4TOTAL, FREET4, T3FREE, THYROIDAB in the last 72 hours. Anemia Panel: No results for input(s): VITAMINB12, FOLATE, FERRITIN, TIBC, IRON, RETICCTPCT in the last 72 hours.    Radiology Studies: I have reviewed all of the imaging during this hospital visit personally     Scheduled Meds: . buprenorphine-naloxone  1 tablet Sublingual BID  . Chlorhexidine Gluconate Cloth  6 each Topical Daily  . docusate sodium  100 mg Oral BID  . enoxaparin (LOVENOX) injection  40 mg Subcutaneous Q24H  . feeding supplement (ENSURE ENLIVE)  237 mL Oral TID BM  . ferrous sulfate  325 mg Oral TID WC  . multivitamin with minerals  1 tablet Oral Daily  . sodium chloride flush  10-40 mL Intracatheter Q12H   Continuous Infusions: . ceFEPime (MAXIPIME) IV 2 g (04/15/20 0552)  . vancomycin 1,000 mg (04/15/20 0201)     LOS: 35 days        Yazeed Pryer Annett Gula, MD

## 2020-04-15 NOTE — Progress Notes (Signed)
Physical Therapy Treatment Patient Details Name: Robert Kramer MRN: [id number] DOB: 07/04/1976 Today's Date: 04/15/2020    History of Present Illness Pt is a 44 y.o. male admitted 03/11/20 for evaluation of abnormal blood cultures. Chest CTA shows multiple cavitary and noncavitary nodules consistent with septic emboli; bilateral PNA, bilateral hilar adenopathy. 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 Pt with L shoulder septic arthritis s/p I&D 9/10. S/p catheter-based debridement of tricuspid valve 9/16. PMH includes IVDA, bipolar disorder.  Underwent AngioVac procedure 9/16.    PT Comments    Patient received in bed, very pleasant and cooperative today. Able to mobilize on a Mod(I) to min guard level with no device this morning. Initially much more steady on his feet, however R/L weave and drift and gross unsteadiness all increased as fatigue worsened. Impaired insight into deficits- walking much faster than was safe given balance impairment and obstacles in hallway and needed repeated cues for safety. Able to progress gait distance significantly since last PT session. Left sitting at EOB to eat with bed alarm active and all needs otherwise met this morning, will continue to follow acutely.    Follow Up Recommendations  Home health PT;Supervision/Assistance - 24 hour     Equipment Recommendations  Rolling walker with 5" wheels    Recommendations for Other Services       Precautions / Restrictions Precautions Precautions: Fall Precaution Comments: Contact precautions Restrictions Weight Bearing Restrictions: No LUE Weight Bearing: Weight bearing as tolerated LLE Weight Bearing: Weight bearing as tolerated Other Position/Activity Restrictions: AROM as tolerated.  No restrictions to L shoulder    Mobility  Bed Mobility Overal bed mobility: Independent       Supine to sit: Independent Sit to supine: Independent      Transfers Overall transfer level:  Modified independent Equipment used: None Transfers: Sit to/from Stand Sit to Stand: Modified independent (Device/Increase time)         General transfer comment: no physical assist given, did help to manage lines however  Ambulation/Gait Ambulation/Gait assistance: Min guard Gait Distance (Feet): 300 Feet Assistive device: None Gait Pattern/deviations: Step-through pattern;Drifts right/left;Trunk flexed Gait velocity: WNL   General Gait Details: close S to min guard and cues for safety- walking faster than was necessarily safe given obstacles in hallway and room, also become more unsteady as fatigue increased   Stairs             Wheelchair Mobility    Modified Rankin (Stroke Patients Only)       Balance Overall balance assessment: Needs assistance Sitting-balance support: No upper extremity supported;Feet supported Sitting balance-Leahy Scale: Good     Standing balance support: No upper extremity supported Standing balance-Leahy Scale: Fair Standing balance comment: increased drift L/R as fatigue increased                            Cognition Arousal/Alertness: Awake/alert Behavior During Therapy: WFL for tasks assessed/performed Overall Cognitive Status: Within Functional Limits for tasks assessed                                 General Comments: slight impairment in safety- ambulating faster than was necessarily safe given obstacles in room and hallway      Exercises      General Comments General comments (skin integrity, edema, etc.): VSS      Pertinent Vitals/Pain Pain Assessment:  Faces Faces Pain Scale: No hurt Pain Intervention(s): Limited activity within patient's tolerance;Monitored during session    Home Living                      Prior Function            PT Goals (current goals can now be found in the care plan section) Acute Rehab PT Goals Patient Stated Goal: Patient is anxious to go home. PT  Goal Formulation: With patient Time For Goal Achievement: 04/29/20 Potential to Achieve Goals: Good Progress towards PT goals: Progressing toward goals    Frequency    Min 3X/week      PT Plan Current plan remains appropriate    Co-evaluation              AM-PAC PT "6 Clicks" Mobility   Outcome Measure  Help needed turning from your back to your side while in a flat bed without using bedrails?: None Help needed moving from lying on your back to sitting on the side of a flat bed without using bedrails?: None Help needed moving to and from a bed to a chair (including a wheelchair)?: None Help needed standing up from a chair using your arms (e.g., wheelchair or bedside chair)?: None Help needed to walk in hospital room?: A Little Help needed climbing 3-5 steps with a railing? : A Little 6 Click Score: 22    End of Session   Activity Tolerance: Patient tolerated treatment well Patient left: in bed;with call bell/phone within reach;with bed alarm set (sitting at EOB per his request) Nurse Communication: Mobility status PT Visit Diagnosis: Difficulty in walking, not elsewhere classified (R26.2);Other abnormalities of gait and mobility (R26.89)     Time: 1150-1210 PT Time Calculation (min) (ACUTE ONLY): 20 min  Charges:  $Gait Training: 8-22 mins                     Windell Norfolk, DPT, PN1   Supplemental Physical Therapist [place]    Pager [phone] Acute Rehab Office [phone]

## 2020-04-15 NOTE — Consult Note (Signed)
WOC Nurse wound follow up note: Wound type: Full thickness, Unstageable PI to right lateral malleolus Measurement:1.5cm x 1.2cm with depth unable to be determined due to the presence of yellow fibrinous slough Wound bed: 30% red, 70% fibrinous yellow slough (slightly less slough than was noted at last assessment) Drainage (amount, consistency, odor) small amount of light yellow Periwound:Mild erythema at periphery Dressing procedure/placement/frequency: Hydrocolloid dressing is still appropriate for the autolysis of the yellow fibrinous slough. No change made to POC.   WOC nursing team will follow, seeing patient every 7-10 days and will remain available to this patient, the nursing and medical teams.  Please re-consult if needed between visits. Thanks, Ladona Mow, MSN, RN, GNP, [place]  Pager# [phone]

## 2020-04-16 LAB — BASIC METABOLIC PANEL
Anion gap: 8 (ref 5–15)
BUN: 25 mg/dL — ABNORMAL HIGH (ref 6–20)
CO2: 31 mmol/L (ref 22–32)
Calcium: 9.7 mg/dL (ref 8.9–10.3)
Chloride: 96 mmol/L — ABNORMAL LOW (ref 98–111)
Creatinine, Ser: 0.76 mg/dL (ref 0.61–1.24)
GFR, Estimated: >60 mL/min (ref >60)
Glucose, Bld: 126 mg/dL — ABNORMAL HIGH (ref 70–99)
Potassium: 4.4 mmol/L (ref 3.5–5.1)
Sodium: 135 mmol/L (ref 135–145)

## 2020-04-16 LAB — VANCOMYCIN, TROUGH: Vancomycin Tr: 23 ug/mL (ref 15–20)

## 2020-04-16 MED ORDER — VANCOMYCIN HCL 750 MG/150ML IV SOLN
750.0000 mg | Freq: Two times a day (BID) | INTRAVENOUS | Status: AC
  Administered 2020-04-17 – 2020-04-22 (×12): 750 mg via INTRAVENOUS
  Filled 2020-04-16 (×12): qty 150

## 2020-04-16 NOTE — Progress Notes (Signed)
Nutrition Follow-up  DOCUMENTATION CODES:   Not applicable  INTERVENTION:    Continue to supplement regular diet with Ensure Enlive po TID, each supplement provides 350 kcal and 20 grams of protein.  Continue MVI with minerals.  NUTRITION DIAGNOSIS:   Increased nutrient needs related to acute illness (sepsis) as evidenced by estimated needs.  Ongoing   GOAL:   Patient will meet greater than or equal to 90% of their needs  Progressing   MONITOR:   PO intake, Supplement acceptance, Labs, Weight trends, I & O's  REASON FOR ASSESSMENT:   Consult Assessment of nutrition requirement/status  ASSESSMENT:   44 y.o. male with medical history significant for polysubstance use disorder including injection drug use and bipolar disorder who presents to the ED for evaluation of abnormal blood cultures.  Patient remains hospitalized for IV antibiotics. IV antibiotics are scheduled to finish on 10/18 and patient to be discharged home.   Meal completion 75-100%, average 90%  Labs reviewed.  Medications reviewed and include Colace, ferrous sulfate, MVI with minerals.  Diet Order:   Diet Order            Diet regular Room service appropriate? Yes; Fluid consistency: Thin  Diet effective now                 EDUCATION NEEDS:   No education needs have been identified at this time  Skin:  Skin Assessment: Skin Integrity Issues: Skin Integrity Issues:: Stage II Stage II: coccyx and R ankle Incisions: closed incisions to L shoulder, R chest, R groin  Last BM:  10/12  Height:   Ht Readings from Last 1 Encounters:  03/11/20 5\' 11"  (1.803 m)    Weight:   Wt Readings from Last 1 Encounters:  04/16/20 60 kg   BMI:  Body mass index is 18.45 kg/m.  Estimated Nutritional Needs:   Kcal:  2000-2200 kcals  Protein:  100-115 grams  Fluid:  >/= 2 L    06/16/20, RD, LDN, CNSC Please refer to Amion for contact information.

## 2020-04-16 NOTE — Progress Notes (Signed)
PROGRESS NOTE    Robert Kramer  TKZ:[id number] DOB: Jul 29, 1975 DOA: 03/11/2020 PCP: Patient, No Pcp Per    Brief Narrative:  Robert Kramer has been admitted with a working diagnosis of severe sepsis secondary to tricuspid valve endocarditis, left shoulder septic arthritis, septic pulmonary emboli, MRSA and Serratia bacteremia.  He had a prolonged hospital stay due to unsafe discharge, plan to complete antibiotic therapy during his hospitalization.  44 year old male with past medical history for polysubstance abuse, and bipolar disorder. Patient was seen in the ED 03/06/2020 for heroin withdrawal. He received supportive medical therapy, and had blood cultures drawn. He was discharged home in a stable condition from the ED. His cultures were positive for Serratia and MRSA and he was called to come back to the hospital. At home patient had fevers, chills, diaphoresis, dyspnea, nonproductive cough, nausea, abdominal pain and not feeling well in general. He continued to use IV drugs. On his initial physical examination his temperature was 98.1, blood pressure 121/73, heart rate 125 and respirate of 24. His lungs had bibasilar rales, heart S1-S2, present rhythmic, positive systolic murmur at the left sternal border, abdomen soft, no lower extremity edema.  He was started on broad-spectrum antibiotic therapy with ceftriaxone and vancomycin.  Further work-up with CT chest revealed pulmonary embolism, cavitary noncavitary nodules consistent with septic emboli. Bilateral pneumonia and bilateral hilar adenopathy. Transthoracic echocardiography showed vegetation on the tricuspid valve. Transesophageal echocardiography confirmed 2 x 2 cm long vegetation on the tricuspid valve with resultant severe tricuspid regurgitation.  MRI of his left shoulder shows septic arthropathy, myositis and tenosynovitis. Orthopedics were consulted and he underwent left shoulder washout on 9/10.  He was initially admitted  to [facility], on 9/12 transferred to [facility] for further management of endocarditis.  Patient was evaluated by CT surgery, and he underwent debridement of right atrial mass and debridement of tricuspid valve vegetation on 9/16.  Infectious disease has recommended continue antibiotic therapy until 04/22/2020 with vancomycin and cefepime.  He has been tolerating antibiotic therapy well.    Assessment & Plan:   Principal Problem:   MRSA bacteremia Active Problems:   Polysubstance dependence (HCC)   Hypokalemia   Thrombocytopenia (HCC)   Serratia septicemia (HCC)   Bipolar 1 disorder (HCC)   Heroin abuse (HCC)   Arthritis, septic, shoulder (HCC)   Endocarditis of tricuspid valve   Hepatitis C virus infection   IVDU (intravenous drug user)   Normocytic anemia   Cigarette smoker   Septic pulmonary embolism (HCC)   Pressure injury of skin   Lumbar back pain     1. Severe sepsis due to tricuspid valve endocarditis, (present on admission), MRSA and serratia.Patient had septic emboli complication with left shoulder septic arthritis and septic pulmonary embolism.   Continue antibiotic therapy with vancomycin and cefepimeper ID recommendations, plan to complete in the hospital on 10.18.21. He has been deemed to be a unsafe for outpatient antibiotic therapy due to history of IV drug abuse.  No signs of antibiotic side effects.   2. Anemia of iron deficiency/ thrombocytopenia. Sp one units prbc transfusion. Tolerating well po iron with good toleration.   3. Substance abuse/ tobacco abuse.On suboxone for pain control and opioid dependence.  He will follow up with outpatient clinic with Dr. Para March   4. Hepatitis C. Chronic infection, plan to follow up as outpatient, no clinical signs of liver failure.  5. Coccyx and right heal pressure ulcer stage 2. Not present on admission. Continue to encourage to  be out of bed to chair as tolerated, continue with wound care.     6. Hypokalemia. Resolved, patient is tolerating po well.      Status is: Inpatient  Remains inpatient appropriate because:Unsafe d/c plan   Dispo: The patient is from: Home              Anticipated d/c is to: Home              Anticipated d/c date is: > 3 days              Patient currently is not medically stable to d/c. plan for dc home on 10.18.21    DVT prophylaxis: Enoxaparin   Code Status:    full  Family Communication:  No family at the bedside      Nutrition Status: Nutrition Problem: Increased nutrient needs Etiology: acute illness (sepsis) Signs/Symptoms: estimated needs Interventions: Ensure Enlive (each supplement provides 350kcal and 20 grams of protein), MVI     Skin Documentation: Pressure Injury 03/19/20 Coccyx Mid Stage 2 -  Partial thickness loss of dermis presenting as a shallow open injury with a red, pink wound bed without slough. (Active)  03/19/20 2100  Location: Coccyx  Location Orientation: Mid  Staging: Stage 2 -  Partial thickness loss of dermis presenting as a shallow open injury with a red, pink wound bed without slough.  Wound Description (Comments):   Present on Admission: No     Pressure Injury 04/02/20 Ankle Right Stage 2 -  Partial thickness loss of dermis presenting as a shallow open injury with a red, pink wound bed without slough. (Active)  04/02/20 0730  Location: Ankle  Location Orientation: Right  Staging: Stage 2 -  Partial thickness loss of dermis presenting as a shallow open injury with a red, pink wound bed without slough.  Wound Description (Comments):   Present on Admission:       Consultants:  ID   Vascular surgery   Procedures: debridement of right atrial mass and debridement of tricuspid valve vegetation on 9/16  Antimicrobials:  Vancomycin and cefepime  Subjective: Patient is feeling well, no dyspnea or chest pain, no rash, no nausea or vomiting,   Objective: Vitals:   04/15/20 0550  04/15/20 1712 04/15/20 2304 04/16/20 0719  BP:  104/74 107/75 107/80  Pulse:  78 82 80  Resp: 18 15 16 17   Temp: 98.6 F (37 C) 98.6 F (37 C) 98.3 F (36.8 C) 98 F (36.7 C)  TempSrc: Oral Oral Oral Oral  SpO2:  98% 97% 98%  Weight: 60.4 kg   60 kg  Height:        Intake/Output Summary (Last 24 hours) at 04/16/2020 1436 Last data filed at 04/15/2020 2307 Gross per 24 hour  Intake --  Output 950 ml  Net -950 ml   Filed Weights   04/12/20 0442 04/15/20 0550 04/16/20 0719  Weight: 57.8 kg 60.4 kg 60 kg    Examination:   General: Not in pain or dyspnea Neurology: Awake and alert, non focal  E ENT: no pallor, no icterus, oral mucosa moist Cardiovascular: No JVD. S1-S2 present, rhythmic, no gallops, rubs, or murmurs. No lower extremity edema. Pulmonary: positive breath sounds bilaterally,  Gastrointestinal. Abdomen soft and non tender Skin. No rashes Musculoskeletal: no joint deformities     Data Reviewed: I have personally reviewed following labs and imaging studies  CBC: No results for input(s): WBC, NEUTROABS, HGB, HCT, MCV, PLT in the last 168 hours. Basic  Metabolic Panel: Recent Labs  Lab 04/12/20 0630 04/14/20 0620 04/16/20 1225  NA 134*  --  135  K 4.1  --  4.4  CL 96*  --  96*  CO2 32  --  31  GLUCOSE 99  --  126*  BUN 25*  --  25*  CREATININE 0.64 0.67 0.76  CALCIUM 9.7  --  9.7   GFR: Estimated Creatinine Clearance: 97.9 mL/min (by C-G formula based on SCr of 0.76 mg/dL). Liver Function Tests: No results for input(s): AST, ALT, ALKPHOS, BILITOT, PROT, ALBUMIN in the last 168 hours. No results for input(s): LIPASE, AMYLASE in the last 168 hours. No results for input(s): AMMONIA in the last 168 hours. Coagulation Profile: No results for input(s): INR, PROTIME in the last 168 hours. Cardiac Enzymes: No results for input(s): CKTOTAL, CKMB, CKMBINDEX, TROPONINI in the last 168 hours. BNP (last 3 results) No results for input(s): PROBNP in the  last 8760 hours. HbA1C: No results for input(s): HGBA1C in the last 72 hours. CBG: No results for input(s): GLUCAP in the last 168 hours. Lipid Profile: No results for input(s): CHOL, HDL, LDLCALC, TRIG, CHOLHDL, LDLDIRECT in the last 72 hours. Thyroid Function Tests: No results for input(s): TSH, T4TOTAL, FREET4, T3FREE, THYROIDAB in the last 72 hours. Anemia Panel: No results for input(s): VITAMINB12, FOLATE, FERRITIN, TIBC, IRON, RETICCTPCT in the last 72 hours.    Radiology Studies: I have reviewed all of the imaging during this hospital visit personally     Scheduled Meds: . buprenorphine-naloxone  1 tablet Sublingual BID  . Chlorhexidine Gluconate Cloth  6 each Topical Daily  . docusate sodium  100 mg Oral BID  . enoxaparin (LOVENOX) injection  40 mg Subcutaneous Q24H  . feeding supplement (ENSURE ENLIVE)  237 mL Oral TID BM  . ferrous sulfate  325 mg Oral TID WC  . multivitamin with minerals  1 tablet Oral Daily  . sodium chloride flush  10-40 mL Intracatheter Q12H   Continuous Infusions: . ceFEPime (MAXIPIME) IV 2 g (04/16/20 1226)  . vancomycin 1,000 mg (04/16/20 1359)     LOS: 36 days        Ashrith Sagan Annett Gula, MD

## 2020-04-16 NOTE — Progress Notes (Signed)
Pharmacy Antibiotic Note  Robert Kramer is a 44 y.o. 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 Angiovac procedure completed 9/16. Additionally, patient has L shoulder septic arthritis s/p I&D 9/10 with culture growing rare MRSA. Pharmacy has been consulted for cefepime dosing for Serratia bacteremia/endocarditis (given ampC organism).  Plans to continue antibiotics until 04/22/20. Vancomycin recently dose adjusted 10/7 for elevated trough. Repeat vancomycin trough today is 23 mcg/ml, however drawn ~1h early so true trough is closer to 21 mcg/ml. SCr up slightly but overall stable.   Plan: Continue cefepime 2g q8 hours Continue vancomycin to 750mg  IV q12h Consider vancomycin trough in 2-3 days to assess steady state  Height: 5\' 11"  (180.3 cm) Weight: 60 kg (132 lb 4.4 oz) IBW/kg (Calculated) : 75.3  Temp (24hrs), Avg:98.3 F (36.8 C), Min:98 F (36.7 C), Max:98.6 F (37 C)  Recent Labs  Lab 04/11/20 0738 04/12/20 0630 04/14/20 0620 04/16/20 1225  CREATININE  --  0.64 0.67 0.76  VANCOTROUGH 28*  --   --   --     Estimated Creatinine Clearance: 97.9 mL/min (by C-G formula based on SCr of 0.76 mg/dL).    Allergies  Allergen Reactions  . Tramadol Other (See Comments)    Upset stomach    Antimicrobials this admission: Vancomycin 9/6 >> (10/18) Ceftriaxone 9/6 >>9/7, 9/13>>9/15 Cefepime 9/7 >>9/13, 9/15>>(10/18)  Microbiology results: 9/1 BCx x1: MRSA 9/1 BCx x1: Serratia Marcescens (S=Rocephin, Bactrim, Cipro; R=Cefazolin) 9/6 UCx: mult sp F 9/6 BCx: MRSA F 9/6 COVID: neg 9/8 Hep A reactive 9/8 Hep B NR 9/8 Hep C reactive 9/8 BCx2 ng 9/10 MRSA PCR neg MSSA PCR neg 9/10 L shoulder synovial fluid: rare SA 9/16 tissue/veg: GPC - reincubate 9/25 fungus neg, culture w/ stain pending   Thank you for allowing pharmacy to be a part of this patient's care.  10/16, PharmD Clinical  Pharmacist **Pharmacist phone directory can now be found on [email or web address] ([email or web address]).  Listed under [facility].

## 2020-04-17 NOTE — Progress Notes (Signed)
Occupational Therapy Treatment Patient Details Name: Robert Kramer MRN: [id number] DOB: 1975/10/29 Today's Date: 04/17/2020    History of present illness Pt is a 44 y.o. male admitted 03/11/20 for evaluation of abnormal blood cultures. Chest CTA shows multiple cavitary and noncavitary nodules consistent with septic emboli; bilateral PNA, bilateral hilar adenopathy. 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 Pt with L shoulder septic arthritis s/p I&D 9/10. S/p catheter-based debridement of tricuspid valve 9/16. PMH includes IVDA, bipolar disorder.  Underwent AngioVac procedure 9/16.   OT comments  Pt seen for OT follow up session with focus on LUE ROM/strengthening program. Pt completed exercises as listed below with mod-max pain with attempts at [facility] movements. Added scapular mobilization/strengthening exercises to pts program. OT also provided passive stretch to trapezius and shoulder muscles and applied heat at end of session. D/c recs remain appropriate, will continue to follow.   Follow Up Recommendations  Supervision - Intermittent;No OT follow up    Equipment Recommendations  None recommended by OT    Recommendations for Other Services      Precautions / Restrictions Precautions Precautions: Fall Restrictions Other Position/Activity Restrictions: AROM as tolerated.  No restrictions to L shoulder       Mobility Bed Mobility Overal bed mobility: Independent                Transfers Overall transfer level: Independent                    Balance Overall balance assessment: Needs assistance Sitting-balance support: No upper extremity supported;Feet supported Sitting balance-Leahy Scale: Good     Standing balance support: Single extremity supported;During functional activity Standing balance-Leahy Scale: Fair Standing balance comment: using IV pole for support                           ADL either performed or assessed with  clinical judgement   ADL Overall ADL's : Modified independent                                       General ADL Comments: Pt demonstrating ability to complete ADLs at mod I level this date. Pt completed mobility to bathroom for toilet transfer without external assist from OT and hanging onto IV pole. Pt able to reach down to floor to pick up items and LBD as well without external assist     Vision Patient Visual Report: No change from baseline     Perception     Praxis      Cognition Arousal/Alertness: Awake/alert Behavior During Therapy: WFL for tasks assessed/performed Overall Cognitive Status: Within Functional Limits for tasks assessed                                 General Comments: suspect slight impairment with safety- requires cues for higher level safety awareness, but overall functional        Exercises Other Exercises Other Exercises: LUE wall walks x10 achieving >90 4/10 trials with moderate-max pain Other Exercises: Seated scapular retraction bil x10 Other Exercises: Scapular depression against resistance x10 Other Exercises: [name] to shoulder flexio, able to achieve ~90 degrees before pain limiting Other Exercises: SROM for shoulder flexion in supine x5   Shoulder Instructions       General Comments  Pertinent Vitals/ Pain       Pain Assessment: Faces Faces Pain Scale: Hurts little more Pain Location: L shoulder with exercise/stretching Pain Descriptors / Indicators: Aching;Sore Pain Intervention(s): Limited activity within patient's tolerance;Monitored during session;Heat applied  Home Living                                          Prior Functioning/Environment              Frequency  Min 1X/week        Progress Toward Goals  OT Goals(current goals can now be found in the care plan section)  Progress towards OT goals: Progressing toward goals  Acute Rehab OT Goals Patient Stated  Goal: return home OT Goal Formulation: With patient Time For Goal Achievement: 05/01/20 Potential to Achieve Goals: Good ADL Goals Pt/caregiver will Perform Home Exercise Program: Increased ROM;Increased strength;Left upper extremity;With written HEP provided  Plan Discharge plan remains appropriate;Frequency remains appropriate    Co-evaluation                 AM-PAC OT "6 Clicks" Daily Activity     Outcome Measure   Help from another person eating meals?: None Help from another person taking care of personal grooming?: None Help from another person toileting, which includes using toliet, bedpan, or urinal?: None Help from another person bathing (including washing, rinsing, drying)?: None Help from another person to put on and taking off regular upper body clothing?: None Help from another person to put on and taking off regular lower body clothing?: None 6 Click Score: 24    End of Session    OT Visit Diagnosis: Muscle weakness (generalized) (M62.81);Pain Pain - Right/Left: Left Pain - part of body: Shoulder   Activity Tolerance Patient tolerated treatment well   Patient Left in bed;with call bell/phone within reach   Nurse Communication          Time: 1050-1110 OT Time Calculation (min): 20 min  Charges: OT General Charges $OT Visit: 1 Visit OT Treatments $Therapeutic Exercise: 8-22 mins  Dalphine Handing, MSOT, OTR/L Acute Rehabilitation Services [facility] Office Number: [phone] Pager: [phone]  Dalphine Handing 04/17/2020, 1:37 PM

## 2020-04-17 NOTE — Progress Notes (Signed)
[facility] PROGRESS NOTE  ALAND CHESTNUTT XNA:[id number] DOB: [birth year] DOA: 03/11/2020 PCP: Patient, No Pcp Per  Status: Inpatient--Remains inpatient appropriate because:Unsafe d/c plan and IV treatments appropriate due to intensity of illness or inability to take PO   Dispo: The patient is from: Home              Anticipated d/c is to: Home              Anticipated d/c date is: > 3 days              Patient currently is not medically stable to d/c. UNSAFE DC PLAN Needs to complete a full course of IV antibiotics to treat bacteremia and endocarditis.  Patient is an IV drug abuser and it is unsafe for patient to discharge outside of hospital environment with PICC line in place.  9/28 face-to-face completed regarding home health RN, PT and social work-as of 10/1 patient ready refused home health services including PT.  He is agreeable to receipt of a rolling walker.   Code Status: Full Family Communication: Patient only DVT prophylaxis: Lovenox Vaccination status: ?  Has been given [employer] Covid vaccine prior to current admission; was last immunized against influenza in 2018 and did receive pneumococcal vaccine September 2021  HPI: 44 year old male with history of polysubstance use including IV drug use, bipolar disorder came to ED for evaluation of abnormal blood cultures. Patient was seen in [facility] on 03/06/2020 for evaluation management of heroin withdrawal.  He was treated supportively.  Blood cultures were obtained and grew MRSA and Serratia marcescens.  He was called from home to come to ED for further evaluation.  CTA chest PE showed multiple cavitary and noncavitary nodules consistent with septic emboli, bilateral lower lobe pneumonia and bilateral hilar adenopathy felt to be reactive.  No pulmonary embolus was reported.  Patient was started on IV ceftriaxone and vancomycin.  TTE was performed which showed shaggy vegetations on tricuspid valve.  TEE on 03/15/2020 showed 2 x 2 cm  long vegetation on tricuspid valve with resultant severe TR.  MRI left shoulder showed septic arthropathy, myositis and tenosynovitis.  He underwent washout of left shoulder by orthopedic surgery on 03/15/2020.  He was transferred to [facility] on 03/17/2020 for further evaluation of endocarditis by cardiothoracic surgery.  Subjective:  Awake.  Less pain, shortness of breath or difficulty eating.  Reinforced that he would be ready to discharge next Monday on the 18th.  Made aware that plan is to have Suboxone prescription faxed to [facility] pharmacy on Friday 16th and patient's family will need to pick medication up her pain $25 co-pay  Objective: Vitals:   04/16/20 2030 04/17/20 0500  BP: 109/77 101/71  Pulse: 79 76  Resp: 16   Temp: 98.2 F (36.8 C) 98.1 F (36.7 C)  SpO2: 97% 97%    Intake/Output Summary (Last 24 hours) at 04/17/2020 1311 Last data filed at 04/17/2020 1008 Gross per 24 hour  Intake 1327 ml  Output 1950 ml  Net -623 ml   Filed Weights   04/15/20 0550 04/16/20 0719 04/17/20 0500  Weight: 60.4 kg 60 kg 64 kg    Exam:  Constitutional: NAD, calm, awake Respiratory: clear to auscultation bilaterally, Normal respiratory effort. RA  Cardiovascular: Regular rate and rhythm, no murmurs / rubs / gallops. No extremity edema. 2+ pedal pulses.  Abdomen: no tenderness, no masses palpated. Bowel sounds positive.  Musculoskeletal: no clubbing / cyanosis. No joint deformity upper and  lower extremities. Normal muscle tone.  Neurologic: CN 2-12 grossly intact. Sensation intact, DTR normal. Strength 5/5 x all 4 extremities.  Psychiatric: Normal judgment and insight. Alert and oriented x 3. Normal mood.    Assessment/Plan: Severe sepsis secondary to MRSA and Serratia bacteremia-pulmonary septic emboli, TV endocarditis, left shoulder septic arthritis.   Patient is status post left shoulder arthroscopy with debridement irrigation for septic arthritis as per orthopedics on 03/15/2020.   Echocardiogram showed tricuspid vegetation.  TEE showed 2x2 mobile vegetation with severe TR.   MRI lumbar spine showed no evidence of epidural abscess.  Patient refused MRI right shoulder ordered by ID.   CT surgery was consulted - s/p angiovac debridement on 03/21/2020.  Infectious disease following.  Continue IV vancomycin, Rocephin was changed to cefepime per ID. Repeat blood cultures drawn on 9/8 negative.  ID recommended 4 more weeks of IV antibiotic therapy starting from 03/21/2020 with vancomycin and cefepime until 04/22/2020.  Rec to call ID back prior to discharge to reassess whether he requires outpatient regimen. 10/4 became occluded and required the lytics to resolve  TV endocarditis-s/p Angiovac   CT surgery following prn  Insomnia Had successful improvement in insomnia symptoms with use of prn Benadryl  Hyponatremia sodium is stable at 133 as of 9/23   Serum osmolality 281.  Substance use disorder Ongoing IV heroin use, last use was 3 days prior to admission.   Patient started on Suboxone 8/2 mg twice daily with as needed Suboxone per opiate withdrawal protocol.   Poke with Dr. Erlinda Hong with Suboxone clinic who agreed patient is an appropriate candidate.  He requested that patient be given number to contact [facility] clinic arrange for outpatient follow-up; patient has completed this request. OUD intake assessment completed by telephone. Patient has been scheduled for 04/24/2020 at 9:45 with Dr. Nedra Hai.  HIV antibodies were nonreactive Hepatitis C antibodies were reactive. Hepatitis C genotype 1b.  Patient can follow-up with GI as outpatient for hepatitis C.  Thrombocytopenia Likely due to sepsis.   Platelet count has improved to 136,000  Iron deficiency anemia Hemoglobin stable at 8.4  Patient's hemoglobin dropped after surgery, and was transfused 1 unit PRBC.    Anemia panel obtained on 9/15 showed iron 25, saturation 13%, TIBC 195, B12 440, reticulocyte count  percent 6.1, immature fraction 27.8.   9/22 initiated iron TID with twice daily scheduled Colace to prevent iron-induced constipation Haptoglobin is normal at 202 10/1 iron has increased from 25 to 38 therefore we will continue oral iron replacement.  No indication for IV iron    Data Reviewed: Basic Metabolic Panel: Recent Labs  Lab 04/12/20 0630 04/14/20 0620 04/16/20 1225  NA 134*  --  135  K 4.1  --  4.4  CL 96*  --  96*  CO2 32  --  31  GLUCOSE 99  --  126*  BUN 25*  --  25*  CREATININE 0.64 0.67 0.76  CALCIUM 9.7  --  9.7   Liver Function Tests: No results for input(s): AST, ALT, ALKPHOS, BILITOT, PROT, ALBUMIN in the last 168 hours. No results for input(s): LIPASE, AMYLASE in the last 168 hours. No results for input(s): AMMONIA in the last 168 hours. CBC: No results for input(s): WBC, NEUTROABS, HGB, HCT, MCV, PLT in the last 168 hours. Cardiac Enzymes: No results for input(s): CKTOTAL, CKMB, CKMBINDEX, TROPONINI in the last 168 hours. BNP (last 3 results) No results for input(s): BNP in the last 8760 hours.  ProBNP (last 3 results) No results for input(s): PROBNP in the last 8760 hours.  CBG: No results for input(s): GLUCAP in the last 168 hours.  No results found for this or any previous visit (from the past 240 hour(s)).   Studies: No results found.  Scheduled Meds: . buprenorphine-naloxone  1 tablet Sublingual BID  . Chlorhexidine Gluconate Cloth  6 each Topical Daily  . docusate sodium  100 mg Oral BID  . enoxaparin (LOVENOX) injection  40 mg Subcutaneous Q24H  . feeding supplement  237 mL Oral TID BM  . ferrous sulfate  325 mg Oral TID WC  . multivitamin with minerals  1 tablet Oral Daily  . sodium chloride flush  10-40 mL Intracatheter Q12H   Continuous Infusions: . ceFEPime (MAXIPIME) IV 2 g (04/17/20 16100643)  . vancomycin 750 mg (04/17/20 0352)    Principal Problem:   MRSA bacteremia Active Problems:   Polysubstance dependence (HCC)    Hypokalemia   Thrombocytopenia (HCC)   Serratia septicemia (HCC)   Bipolar 1 disorder (HCC)   Heroin abuse (HCC)   Arthritis, septic, shoulder (HCC)   Endocarditis of tricuspid valve   Hepatitis C virus infection   IVDU (intravenous drug user)   Normocytic anemia   Cigarette smoker   Septic pulmonary embolism (HCC)   Pressure injury of skin   Lumbar back pain   Consultants:  Orthopedics  CVTS  Infectious disease  Procedures: 9/7 2D echocardiogram: 1. Left ventricular ejection fraction, by estimation, is 60 to 65%. The left ventricle has normal function. The left ventricle has no regional wall motion abnormalities. Left ventricular diastolic function could not be evaluated. 2. Right ventricular systolic function is normal. The right ventricular size is normal. There is normal pulmonary artery systolic pressure. The estimated right ventricular systolic pressure is 31.5 mmHg. 3. The mitral valve is normal in structure. No evidence of mitral valve regurgitation. No evidence of mitral stenosis. 4. There are several large shaggy mobile densities one of which measures 1.27 x 1.54cm on the TV worrisome for vegetations. The tricuspid valve is abnormal. 5. The aortic valve is normal in structure. Aortic valve regurgitation is not visualized. No aortic stenosis is present. 6. The inferior vena cava is normal in size with greater than 50% respiratory variability, suggesting right atrial pressure of 3 mmHg.  9/10 TEE: 1. Tricuspid valve endocarditis. 2. Left ventricular ejection fraction, by estimation, is 60 to 65%. The left ventricle has normal function. 3. Right ventricular systolic function is normal. The right ventricular size is normal. 4. No left atrial/left atrial appendage thrombus was detected. 5. Right atrial size was mildly dilated. 6. A small pericardial effusion is present. The pericardial effusion is circumferential. There is no evidence of cardiac tamponade. 7. The mitral  valve is normal in structure. Trivial mitral valve regurgitation. 8. There are 2 separate elongated valvular vegetations along the medial and lateral tricuspid valve leaflets that are approximately 2 cm in length, residing mostly in the right atrial surface. There is associated severe tricuspid regurgitation.. The tricuspid valve is abnormal. Tricuspid valve regurgitation is severe. 9. The aortic valve is normal in structure. Aortic valve regurgitation is not visualized. 10. There are very small shaggy thin mobile echodensities on the pulmonic valve surface. Trivial pulmonic valve regurgitation. This may represent pulmonic  9/10 left shoulder arthroscopy with extensive debridement, I/D, excisional debridement of the labrum and bursa as well as left shoulder subacromial bursectomy  9/16 application of angio VAC with intraoperative TEE: Right heart cannulation -  Debridement of right atrial mass - Debridement of tricuspid valve vegetation  10/4 tPA for occluded PICC  Antibiotics: Anti-infectives (From admission, onward)   Start     Dose/Rate Route Frequency Ordered Stop   04/17/20 0230  vancomycin (VANCOREADY) IVPB 750 mg/150 mL        750 mg 150 mL/hr over 60 Minutes Intravenous Every 12 hours 04/16/20 1457     04/12/20 0200  vancomycin (VANCOCIN) IVPB 1000 mg/200 mL premix  Status:  Discontinued        1,000 mg 200 mL/hr over 60 Minutes Intravenous Every 12 hours 04/11/20 1025 04/16/20 1457   04/01/20 2000  vancomycin (VANCOREADY) IVPB 1500 mg/300 mL  Status:  Discontinued        1,500 mg 150 mL/hr over 120 Minutes Intravenous Every 12 hours 04/01/20 1140 04/11/20 1025   03/22/20 2000  vancomycin (VANCOREADY) IVPB 1250 mg/250 mL  Status:  Discontinued        1,250 mg 166.7 mL/hr over 90 Minutes Intravenous Every 12 hours 03/22/20 1435 04/01/20 1140   03/20/20 1400  ceFEPIme (MAXIPIME) 2 g in sodium chloride 0.9 % 100 mL IVPB        2 g 200 mL/hr over 30 Minutes Intravenous Every 8  hours 03/20/20 0912 04/22/20 2359   03/19/20 2200  vancomycin (VANCOCIN) IVPB 1000 mg/200 mL premix  Status:  Discontinued        1,000 mg 200 mL/hr over 60 Minutes Intravenous Every 12 hours 03/19/20 1258 03/22/20 1435   03/18/20 1400  cefTRIAXone (ROCEPHIN) 2 g in sodium chloride 0.9 % 100 mL IVPB  Status:  Discontinued        2 g 200 mL/hr over 30 Minutes Intravenous Every 24 hours 03/18/20 1105 03/20/20 0909   03/12/20 2000  cefTRIAXone (ROCEPHIN) 2 g in sodium chloride 0.9 % 100 mL IVPB  Status:  Discontinued        2 g 200 mL/hr over 30 Minutes Intravenous Every 24 hours 03/11/20 2058 03/12/20 0937   03/12/20 1400  ceFEPIme (MAXIPIME) 2 g in sodium chloride 0.9 % 100 mL IVPB  Status:  Discontinued        2 g 200 mL/hr over 30 Minutes Intravenous Every 8 hours 03/12/20 0937 03/18/20 1105   03/12/20 0000  vancomycin (VANCOCIN) IVPB 1000 mg/200 mL premix  Status:  Discontinued        1,000 mg 200 mL/hr over 60 Minutes Intravenous Every 8 hours 03/11/20 1816 03/19/20 1258   03/11/20 1830  cefTRIAXone (ROCEPHIN) 2 g in sodium chloride 0.9 % 100 mL IVPB        2 g 200 mL/hr over 30 Minutes Intravenous  Once 03/11/20 1822 03/11/20 2030   03/11/20 1545  vancomycin (VANCOREADY) IVPB 1500 mg/300 mL        1,500 mg 150 mL/hr over 120 Minutes Intravenous  Once 03/11/20 1532 03/11/20 1819       Time spent: 15 minutes    Junious Silk ANP  [facility] Pager [phone]. If 7PM-7AM, please contact night-coverage at [email or web address] 04/17/2020, 1:11 PM  LOS: 37 days

## 2020-04-17 NOTE — Progress Notes (Signed)
Physical Therapy Treatment Patient Details Name: Robert Kramer MRN: [id number] DOB: 1976/01/24 Today's Date: 04/17/2020    History of Present Illness Pt is a 44 y.o. male admitted 03/11/20 for evaluation of abnormal blood cultures. Chest CTA shows multiple cavitary and noncavitary nodules consistent with septic emboli; bilateral PNA, bilateral hilar adenopathy. 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 Pt with L shoulder septic arthritis s/p I&D 9/10. S/p catheter-based debridement of tricuspid valve 9/16. PMH includes IVDA, bipolar disorder.  Underwent AngioVac procedure 9/16.    PT Comments    Pt making good progress with gait and mobility.  He did have some c/o soreness and fatigue from working with OT earlier , so further balance and exercises held after ambulation.  Pt demonstrated mild instability with dynamic gait activities and would benefit from further balance training  He is also complaining of neck stiffness since his surgery this admission - may benefit from ROM, stretching, and posture education/exercises.  Pt does tend to hold head forward - encouraged chin retraction as able.     Follow Up Recommendations  Outpatient PT;Supervision - Intermittent     Equipment Recommendations  Rolling walker with 5" wheels    Recommendations for Other Services       Precautions / Restrictions Precautions Precautions: Fall Restrictions LUE Weight Bearing: Weight bearing as tolerated LLE Weight Bearing: Weight bearing as tolerated Other Position/Activity Restrictions: AROM as tolerated.  No restrictions to L shoulder    Mobility  Bed Mobility Overal bed mobility: Modified Independent Bed Mobility: Sidelying to Sit   Sidelying to sit: Modified independent (Device/Increase time)       General bed mobility comments: increased time for sidlying to sit - ilmited due to L shoulder pain  Transfers Overall transfer level: Independent Equipment used:  None Transfers: Sit to/from Stand Sit to Stand: Independent         General transfer comment: Pt reports getting up and going to bathroom independently; demonstrate safe sit to stand  Ambulation/Gait Ambulation/Gait assistance: Min guard Gait Distance (Feet): 400 Feet Assistive device: IV Pole;None Gait Pattern/deviations: Step-through pattern;Drifts right/left;Narrow base of support;Scissoring;Decreased dorsiflexion - right;Decreased dorsiflexion - left Gait velocity: WNL   General Gait Details: Pt ambulated 100' initialy with IV pole and demonstrated near independence, progressed to no device and demonstrated some instabilities requiring min guard.  Pt occasionally "scuffing" toes - encouraged increased heel strike with some improvment but required repetition.  Pt also with tendency to drift and scissor - cued to monitor base of support   Stairs             Wheelchair Mobility    Modified Rankin (Stroke Patients Only)       Balance Overall balance assessment: Needs assistance Sitting-balance support: No upper extremity supported;Feet supported Sitting balance-Leahy Scale: Normal     Standing balance support: No upper extremity supported Standing balance-Leahy Scale: Good Standing balance comment: Ambulated without AD - see below for dynamic gait task               High Level Balance Comments: Challenged balance with stepping over objects, changing speeds, quick turns, and navigating in tight area.  Tried to have pt perform head turns but he declined due to neck/shoulder pain            Cognition Arousal/Alertness: Awake/alert Behavior During Therapy: WFL for tasks assessed/performed Overall Cognitive Status: Within Functional Limits for tasks assessed  General Comments: suspect slight impairment with safety- requires cues for higher level safety awareness, but overall functional      Exercises Other  Exercises Other Exercises: LUE wall walks x10 achieving >90 4/10 trials with moderate-max pain Other Exercises: Seated scapular retraction bil x10 Other Exercises: Scapular depression against resistance x10 Other Exercises: [name] to shoulder flexio, able to achieve ~90 degrees before pain limiting Other Exercises: SROM for shoulder flexion in supine x5    General Comments General comments (skin integrity, edema, etc.): vss;  Pt reports still sore from earlier , so further balance activities held      Pertinent Vitals/Pain Pain Assessment: Faces Faces Pain Scale: Hurts little more Pain Location: L shoulder with exercise/stretching Pain Descriptors / Indicators: Aching;Sore Pain Intervention(s): Limited activity within patient's tolerance;Monitored during session;Heat applied    Home Living                      Prior Function            PT Goals (current goals can now be found in the care plan section) Acute Rehab PT Goals Patient Stated Goal: return home PT Goal Formulation: With patient Time For Goal Achievement: 04/29/20 Potential to Achieve Goals: Good Progress towards PT goals: Progressing toward goals    Frequency    Min 3X/week      PT Plan Discharge plan needs to be updated    Co-evaluation              AM-PAC PT "6 Clicks" Mobility   Outcome Measure  Help needed turning from your back to your side while in a flat bed without using bedrails?: None Help needed moving from lying on your back to sitting on the side of a flat bed without using bedrails?: None Help needed moving to and from a bed to a chair (including a wheelchair)?: None Help needed standing up from a chair using your arms (e.g., wheelchair or bedside chair)?: None Help needed to walk in hospital room?: None Help needed climbing 3-5 steps with a railing? : A Little 6 Click Score: 23    End of Session Equipment Utilized During Treatment: Gait belt Activity Tolerance: Patient  tolerated treatment well Patient left: in bed;with call bell/phone within reach;Other (comment) (pt reports going to bathroom independently, no alarm applied,  - left sitting EOB per his request) Nurse Communication: Mobility status PT Visit Diagnosis: Difficulty in walking, not elsewhere classified (R26.2);Other abnormalities of gait and mobility (R26.89)     Time: 1308-6578 PT Time Calculation (min) (ACUTE ONLY): 18 min  Charges:  $Gait Training: 8-22 mins                     Anise Salvo, PT Acute Rehab Services Pager [phone] [facility] [phone]     Rayetta Humphrey 04/17/2020, 4:06 PM

## 2020-04-17 NOTE — Progress Notes (Signed)
PT Cancellation Note  Patient Details Name: Robert Kramer MRN: [id number] DOB: 1976-01-19   Cancelled Treatment:    Reason Eval/Treat Not Completed: Other (comment) Declined due to just worked with OT and shoulder is sore.  Will f/u as able. Anise Salvo, PT Acute Rehab Services Pager [phone] [facility] [phone]    Rayetta Humphrey 04/17/2020, 12:24 PM

## 2020-04-18 ENCOUNTER — Other Ambulatory Visit (HOSPITAL_COMMUNITY): Payer: Self-pay | Admitting: Family Medicine

## 2020-04-18 MED ORDER — BUPRENORPHINE HCL-NALOXONE HCL 8-2 MG SL SUBL
1.0000 | SUBLINGUAL_TABLET | Freq: Two times a day (BID) | SUBLINGUAL | 0 refills | Status: DC
Start: 2020-04-18 — End: 2020-04-18

## 2020-04-18 MED FILL — BUPREN-NALOX SL TAB 8-2MG: 8-2 | 5 days supply | Qty: 10 | Fill #0

## 2020-04-18 NOTE — Plan of Care (Signed)
Asked to provide bridge Suboxone prescription.  Patient is 44 y.o. 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  He is nearing antibiotic treatment and plan is to discharge on Monday.  Substance use disorder treatment has been arranged in short interval.  Patient here with normal renal and liver function, blood counts notable for mild anemia only.  Stable here on Suboxone 8-2 BID.  Will continue with plans for short interval SUD treatment follow up for continuation.

## 2020-04-18 NOTE — Progress Notes (Signed)
[facility] PROGRESS NOTE  Robert Kramer HYI:[id number] DOB: [birth year] DOA: 03/11/2020 PCP: Patient, No Pcp Per  Status: Inpatient--Remains inpatient appropriate because:Unsafe d/c plan and IV treatments appropriate due to intensity of illness or inability to take PO   Dispo: The patient is from: Home              Anticipated d/c is to: Home              Anticipated d/c date is: > 3 days              Patient currently is not medically stable to d/c. UNSAFE DC PLAN Needs to complete a full course of IV antibiotics to treat bacteremia and endocarditis.  Patient is an IV drug abuser and it is unsafe for patient to discharge outside of hospital environment with PICC line in place.  9/28 face-to-face completed regarding home health RN, PT and social work-as of 10/1 patient ready refused home health services including PT.  He is agreeable to receipt of a rolling walker.   Code Status: Full Family Communication: Patient only DVT prophylaxis: Lovenox Vaccination status: ?  Has been given [employer] Covid vaccine prior to current admission; was last immunized against influenza in 2018 and did receive pneumococcal vaccine September 2021  HPI: 44 year old male with history of polysubstance use including IV drug use, bipolar disorder came to ED for evaluation of abnormal blood cultures. Patient was seen in [facility] on 03/06/2020 for evaluation management of heroin withdrawal.  He was treated supportively.  Blood cultures were obtained and grew MRSA and Serratia marcescens.  He was called from home to come to ED for further evaluation.  CTA chest PE showed multiple cavitary and noncavitary nodules consistent with septic emboli, bilateral lower lobe pneumonia and bilateral hilar adenopathy felt to be reactive.  No pulmonary embolus was reported.  Patient was started on IV ceftriaxone and vancomycin.  TTE was performed which showed shaggy vegetations on tricuspid valve.  TEE on 03/15/2020 showed 2 x 2 cm  long vegetation on tricuspid valve with resultant severe TR.  MRI left shoulder showed septic arthropathy, myositis and tenosynovitis.  He underwent washout of left shoulder by orthopedic surgery on 03/15/2020.  He was transferred to [facility] on 03/17/2020 for further evaluation of endocarditis by cardiothoracic surgery.  Subjective:  Very sleepy this morning.  States did not sleep well last night.  Informed that prescription will be sent to [facility] pharmacy for the Suboxone and his family will need to pick up on Monday and pay $25 co-pay.  Patient verbalizes understanding.  Objective: Vitals:   04/17/20 2147 04/18/20 0610  BP: 113/80 108/74  Pulse: 90 92  Resp: 16 15  Temp: 98.4 F (36.9 C) 98.3 F (36.8 C)  SpO2: 98% 100%    Intake/Output Summary (Last 24 hours) at 04/18/2020 1411 Last data filed at 04/18/2020 0500 Gross per 24 hour  Intake --  Output 2100 ml  Net -2100 ml   Filed Weights   04/16/20 0719 04/17/20 0500 04/18/20 0610  Weight: 60 kg 64 kg 58.9 kg    Exam:  Constitutional: NAD, calm, awake Respiratory: clear to auscultation bilaterally, Normal respiratory effort. RA  Cardiovascular: Regular rate and rhythm, no murmurs / rubs / gallops. No extremity edema. 2+ pedal pulses.  Abdomen: no tenderness, no masses palpated. Bowel sounds positive.  Musculoskeletal: no clubbing / cyanosis. No joint deformity upper and lower extremities. Normal muscle tone.  Neurologic: CN 2-12 grossly intact. Sensation  intact, DTR normal. Strength 5/5 x all 4 extremities.  Sleepy secondary to reports of insomnia overnight. Psychiatric: Normal judgment and insight. Alert and oriented x 3. Normal mood.    Assessment/Plan: Severe sepsis secondary to MRSA and Serratia bacteremia-pulmonary septic emboli, TV endocarditis, left shoulder septic arthritis.   Patient is status post left shoulder arthroscopy with debridement irrigation for septic arthritis as per orthopedics on 03/15/2020.   Echocardiogram showed tricuspid vegetation.  TEE showed 2x2 mobile vegetation with severe TR.   MRI lumbar spine showed no evidence of epidural abscess.  Patient refused MRI right shoulder ordered by ID.   CT surgery was consulted - s/p angiovac debridement on 03/21/2020.  Infectious disease following.  Continue IV vancomycin, Rocephin was changed to cefepime per ID. Repeat blood cultures drawn on 9/8 negative.  ID recommended 4 more weeks of IV antibiotic therapy starting from 03/21/2020 with vancomycin and cefepime until 04/22/2020 will be this coming Monday.  Rec to call ID back prior to discharge to reassess whether he requires outpatient regimen. 10/4 became occluded and required the lytics to resolve  TV endocarditis-s/p Angiovac   CT surgery following prn  Insomnia Had successful improvement in insomnia symptoms with use of prn Benadryl  Hyponatremia sodium is stable at 133 as of 9/23   Serum osmolality 281.  Substance use disorder Ongoing IV heroin use, last use was 3 days prior to admission.   Patient started on Suboxone 8/2 mg twice daily with as needed Suboxone per opiate withdrawal protocol.   Spoke with Dr. Erlinda Hong with Suboxone clinic who agreed patient is an appropriate candidate.  He requested that patient be given number to contact [facility] clinic arrange for outpatient follow-up; patient has completed this request. OUD intake assessment completed by telephone. Patient has been scheduled for 04/24/2020 at 9:45 with Dr. Nedra Hai.  Prescription for short-term Suboxone call to [facility] pharmacy on 10/14 with plans for family/patient to pick up on date of discharge 10/18 HIV antibodies were nonreactive Hepatitis C antibodies were reactive. Hepatitis C genotype 1b.  Patient can follow-up with GI as outpatient for hepatitis C.  Thrombocytopenia Likely due to sepsis.   Platelet count has improved to 136,000  Iron deficiency anemia Hemoglobin stable at 8.4  Patient's  hemoglobin dropped after surgery, and was transfused 1 unit PRBC.    Anemia panel obtained on 9/15 showed iron 25, saturation 13%, TIBC 195, B12 440, reticulocyte count percent 6.1, immature fraction 27.8.   9/22 initiated iron TID with twice daily scheduled Colace to prevent iron-induced constipation Haptoglobin is normal at 202 10/1 iron has increased from 25 to 38 therefore we will continue oral iron replacement.  No indication for IV iron    Data Reviewed: Basic Metabolic Panel: Recent Labs  Lab 04/12/20 0630 04/14/20 0620 04/16/20 1225  NA 134*  --  135  K 4.1  --  4.4  CL 96*  --  96*  CO2 32  --  31  GLUCOSE 99  --  126*  BUN 25*  --  25*  CREATININE 0.64 0.67 0.76  CALCIUM 9.7  --  9.7   Liver Function Tests: No results for input(s): AST, ALT, ALKPHOS, BILITOT, PROT, ALBUMIN in the last 168 hours. No results for input(s): LIPASE, AMYLASE in the last 168 hours. No results for input(s): AMMONIA in the last 168 hours. CBC: No results for input(s): WBC, NEUTROABS, HGB, HCT, MCV, PLT in the last 168 hours. Cardiac Enzymes: No results for input(s): CKTOTAL, CKMB,  CKMBINDEX, TROPONINI in the last 168 hours. BNP (last 3 results) No results for input(s): BNP in the last 8760 hours.  ProBNP (last 3 results) No results for input(s): PROBNP in the last 8760 hours.  CBG: No results for input(s): GLUCAP in the last 168 hours.  No results found for this or any previous visit (from the past 240 hour(s)).   Studies: No results found.  Scheduled Meds: . buprenorphine-naloxone  1 tablet Sublingual BID  . Chlorhexidine Gluconate Cloth  6 each Topical Daily  . docusate sodium  100 mg Oral BID  . enoxaparin (LOVENOX) injection  40 mg Subcutaneous Q24H  . feeding supplement  237 mL Oral TID BM  . ferrous sulfate  325 mg Oral TID WC  . multivitamin with minerals  1 tablet Oral Daily  . sodium chloride flush  10-40 mL Intracatheter Q12H   Continuous Infusions: . ceFEPime  (MAXIPIME) IV 2 g (04/18/20 0644)  . vancomycin 750 mg (04/18/20 0249)    Principal Problem:   MRSA bacteremia Active Problems:   Polysubstance dependence (HCC)   Hypokalemia   Thrombocytopenia (HCC)   Serratia septicemia (HCC)   Bipolar 1 disorder (HCC)   Heroin abuse (HCC)   Arthritis, septic, shoulder (HCC)   Endocarditis of tricuspid valve   Hepatitis C virus infection   IVDU (intravenous drug user)   Normocytic anemia   Cigarette smoker   Septic pulmonary embolism (HCC)   Pressure injury of skin   Lumbar back pain   Consultants:  Orthopedics  CVTS  Infectious disease  Procedures: 9/7 2D echocardiogram: 1. Left ventricular ejection fraction, by estimation, is 60 to 65%. The left ventricle has normal function. The left ventricle has no regional wall motion abnormalities. Left ventricular diastolic function could not be evaluated. 2. Right ventricular systolic function is normal. The right ventricular size is normal. There is normal pulmonary artery systolic pressure. The estimated right ventricular systolic pressure is 31.5 mmHg. 3. The mitral valve is normal in structure. No evidence of mitral valve regurgitation. No evidence of mitral stenosis. 4. There are several large shaggy mobile densities one of which measures 1.27 x 1.54cm on the TV worrisome for vegetations. The tricuspid valve is abnormal. 5. The aortic valve is normal in structure. Aortic valve regurgitation is not visualized. No aortic stenosis is present. 6. The inferior vena cava is normal in size with greater than 50% respiratory variability, suggesting right atrial pressure of 3 mmHg.  9/10 TEE: 1. Tricuspid valve endocarditis. 2. Left ventricular ejection fraction, by estimation, is 60 to 65%. The left ventricle has normal function. 3. Right ventricular systolic function is normal. The right ventricular size is normal. 4. No left atrial/left atrial appendage thrombus was detected. 5. Right atrial  size was mildly dilated. 6. A small pericardial effusion is present. The pericardial effusion is circumferential. There is no evidence of cardiac tamponade. 7. The mitral valve is normal in structure. Trivial mitral valve regurgitation. 8. There are 2 separate elongated valvular vegetations along the medial and lateral tricuspid valve leaflets that are approximately 2 cm in length, residing mostly in the right atrial surface. There is associated severe tricuspid regurgitation.. The tricuspid valve is abnormal. Tricuspid valve regurgitation is severe. 9. The aortic valve is normal in structure. Aortic valve regurgitation is not visualized. 10. There are very small shaggy thin mobile echodensities on the pulmonic valve surface. Trivial pulmonic valve regurgitation. This may represent pulmonic  9/10 left shoulder arthroscopy with extensive debridement, I/D, excisional debridement of the  labrum and bursa as well as left shoulder subacromial bursectomy  9/16 application of angio VAC with intraoperative TEE: Right heart cannulation - Debridement of right atrial mass - Debridement of tricuspid valve vegetation  10/4 tPA for occluded PICC  Antibiotics: Anti-infectives (From admission, onward)   Start     Dose/Rate Route Frequency Ordered Stop   04/17/20 0230  vancomycin (VANCOREADY) IVPB 750 mg/150 mL        750 mg 150 mL/hr over 60 Minutes Intravenous Every 12 hours 04/16/20 1457     04/12/20 0200  vancomycin (VANCOCIN) IVPB 1000 mg/200 mL premix  Status:  Discontinued        1,000 mg 200 mL/hr over 60 Minutes Intravenous Every 12 hours 04/11/20 1025 04/16/20 1457   04/01/20 2000  vancomycin (VANCOREADY) IVPB 1500 mg/300 mL  Status:  Discontinued        1,500 mg 150 mL/hr over 120 Minutes Intravenous Every 12 hours 04/01/20 1140 04/11/20 1025   03/22/20 2000  vancomycin (VANCOREADY) IVPB 1250 mg/250 mL  Status:  Discontinued        1,250 mg 166.7 mL/hr over 90 Minutes Intravenous Every 12  hours 03/22/20 1435 04/01/20 1140   03/20/20 1400  ceFEPIme (MAXIPIME) 2 g in sodium chloride 0.9 % 100 mL IVPB        2 g 200 mL/hr over 30 Minutes Intravenous Every 8 hours 03/20/20 0912 04/22/20 2359   03/19/20 2200  vancomycin (VANCOCIN) IVPB 1000 mg/200 mL premix  Status:  Discontinued        1,000 mg 200 mL/hr over 60 Minutes Intravenous Every 12 hours 03/19/20 1258 03/22/20 1435   03/18/20 1400  cefTRIAXone (ROCEPHIN) 2 g in sodium chloride 0.9 % 100 mL IVPB  Status:  Discontinued        2 g 200 mL/hr over 30 Minutes Intravenous Every 24 hours 03/18/20 1105 03/20/20 0909   03/12/20 2000  cefTRIAXone (ROCEPHIN) 2 g in sodium chloride 0.9 % 100 mL IVPB  Status:  Discontinued        2 g 200 mL/hr over 30 Minutes Intravenous Every 24 hours 03/11/20 2058 03/12/20 0937   03/12/20 1400  ceFEPIme (MAXIPIME) 2 g in sodium chloride 0.9 % 100 mL IVPB  Status:  Discontinued        2 g 200 mL/hr over 30 Minutes Intravenous Every 8 hours 03/12/20 0937 03/18/20 1105   03/12/20 0000  vancomycin (VANCOCIN) IVPB 1000 mg/200 mL premix  Status:  Discontinued        1,000 mg 200 mL/hr over 60 Minutes Intravenous Every 8 hours 03/11/20 1816 03/19/20 1258   03/11/20 1830  cefTRIAXone (ROCEPHIN) 2 g in sodium chloride 0.9 % 100 mL IVPB        2 g 200 mL/hr over 30 Minutes Intravenous  Once 03/11/20 1822 03/11/20 2030   03/11/20 1545  vancomycin (VANCOREADY) IVPB 1500 mg/300 mL        1,500 mg 150 mL/hr over 120 Minutes Intravenous  Once 03/11/20 1532 03/11/20 1819       Time spent: 15 minutes    Junious Silk ANP  [facility] Pager [phone]. If 7PM-7AM, please contact night-coverage at [email or web address] 04/18/2020, 2:11 PM  LOS: 38 days

## 2020-04-19 NOTE — Progress Notes (Signed)
Physical Therapy Treatment Patient Details Name: Robert Kramer MRN: [id number] DOB: 07/12/1975 Today's Date: 04/19/2020    History of Present Illness Pt is a 44 y.o. male admitted 03/11/20 for evaluation of abnormal blood cultures. Chest CTA shows multiple cavitary and noncavitary nodules consistent with septic emboli; bilateral PNA, bilateral hilar adenopathy. 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 Pt with L shoulder septic arthritis s/p I&D 9/10. S/p catheter-based debridement of tricuspid valve 9/16. PMH includes IVDA, bipolar disorder.  Underwent AngioVac procedure 9/16.    PT Comments    Pt asleep on entry, wakes easily however reluctant to walk with therapy. Pt states "I just am not sleeping well.". Pt is independent with bed mobility and transfers. Pt is supervision for ambulation, with longer distances min guard due to ill fitting shoes and decreased foot clearance. Pt is looking forward to discharge this weekend.    Follow Up Recommendations  Outpatient PT;Supervision - Intermittent     Equipment Recommendations  Rolling walker with 5" wheels    Recommendations for Other Services       Precautions / Restrictions Precautions Precautions: Fall Precaution Comments: Contact precautions Restrictions Other Position/Activity Restrictions: AROM as tolerated.  No restrictions to L shoulder    Mobility  Bed Mobility Overal bed mobility: Independent Bed Mobility: Supine to Sit     Supine to sit: Independent        Transfers Overall transfer level: Independent Equipment used: None Transfers: Sit to/from Stand Sit to Stand: Independent            Ambulation/Gait Ambulation/Gait assistance: Scientist, clinical (histocompatibility and immunogenetics) (Feet): 650 Feet Assistive device: IV Pole Gait Pattern/deviations: Step-through pattern;Drifts right/left;Narrow base of support;Scissoring;Decreased dorsiflexion - right;Decreased dorsiflexion - left Gait velocity:  WNL   General Gait Details: supervision for initial 500 feet, then min guard due to decreased foot clearance with steps, pt reports he is tired       Balance Overall balance assessment: Needs assistance Sitting-balance support: No upper extremity supported;Feet supported Sitting balance-Leahy Scale: Normal     Standing balance support: No upper extremity supported Standing balance-Leahy Scale: Good                              Cognition Arousal/Alertness: Awake/alert Behavior During Therapy: WFL for tasks assessed/performed Overall Cognitive Status: Within Functional Limits for tasks assessed Area of Impairment: Attention;Following commands;Awareness;Problem solving                   Current Attention Level: Selective   Following Commands: Follows multi-step commands consistently   Awareness: Emergent Problem Solving: Requires verbal cues General Comments: slightly impaired safety awareness      Exercises General Exercises - Lower Extremity Hip Flexion/Marching:  (with use of RW)        Pertinent Vitals/Pain Pain Assessment: Faces Faces Pain Scale: Hurts a little bit Pain Location: L shoulder with exercise/stretching Pain Descriptors / Indicators: Aching;Sore;Grimacing;Guarding Pain Intervention(s): Monitored during session           PT Goals (current goals can now be found in the care plan section) Acute Rehab PT Goals Patient Stated Goal: return home PT Goal Formulation: With patient Time For Goal Achievement: 04/29/20 Potential to Achieve Goals: Good Progress towards PT goals: Progressing toward goals    Frequency    Min 3X/week      PT Plan Current plan remains appropriate       AM-PAC PT "6 Clicks"  Mobility   Outcome Measure  Help needed turning from your back to your side while in a flat bed without using bedrails?: None Help needed moving from lying on your back to sitting on the side of a flat bed without using  bedrails?: None Help needed moving to and from a bed to a chair (including a wheelchair)?: None Help needed standing up from a chair using your arms (e.g., wheelchair or bedside chair)?: None Help needed to walk in hospital room?: None Help needed climbing 3-5 steps with a railing? : A Little 6 Click Score: 23    End of Session   Activity Tolerance: Patient tolerated treatment well Patient left: in bed;with call bell/phone within reach;Other (comment) (pt reports going to bathroom independently, no alarm applied,  - left sitting EOB per his request) Nurse Communication: Mobility status PT Visit Diagnosis: Difficulty in walking, not elsewhere classified (R26.2);Other abnormalities of gait and mobility (R26.89)     Time: 1610-9604 PT Time Calculation (min) (ACUTE ONLY): 20 min  Charges:  $Gait Training: 8-22 mins                     Bernita Beckstrom B. Beverely Risen PT, DPT Acute Rehabilitation Services Pager [phone] Office [phone]    [name] Fleet 04/19/2020, 5:30 PM

## 2020-04-19 NOTE — Progress Notes (Signed)
[facility] PROGRESS NOTE  Robert Kramer ZOX:[id number] DOB: Jun 21, 1976 DOA: 03/11/2020 PCP: Patient, No Pcp Per  Status: Inpatient--Remains inpatient appropriate because:Unsafe d/c plan and IV treatments appropriate due to intensity of illness or inability to take PO   Dispo: The patient is from: Home              Anticipated d/c is to: Home              Anticipated d/c date is: > 3 days              Patient currently is not medically stable to d/c. UNSAFE DC PLAN Needs to complete a full course of IV antibiotics to treat bacteremia and endocarditis.  Patient is an IV drug abuser and it is unsafe for patient to discharge outside of hospital environment with PICC line in place.  9/28 face-to-face completed regarding home health RN, PT and social work-as of 10/1 patient ready refused home health services including PT.  He is agreeable to receipt of a rolling walker.   Code Status: Full Family Communication: Patient only DVT prophylaxis: Lovenox Vaccination status: ?  Has been given [employer] Covid vaccine prior to current admission; was last immunized against influenza in 2018 and did receive pneumococcal vaccine September 2021  HPI: 44 year old male with history of polysubstance use including IV drug use, bipolar disorder came to ED for evaluation of abnormal blood cultures. Patient was seen in [facility] on 03/06/2020 for evaluation management of heroin withdrawal.  He was treated supportively.  Blood cultures were obtained and grew MRSA and Serratia marcescens.  He was called from home to come to ED for further evaluation.  CTA chest PE showed multiple cavitary and noncavitary nodules consistent with septic emboli, bilateral lower lobe pneumonia and bilateral hilar adenopathy felt to be reactive.  No pulmonary embolus was reported.  Patient was started on IV ceftriaxone and vancomycin.  TTE was performed which showed shaggy vegetations on tricuspid valve.  TEE on 03/15/2020 showed 2 x 2 cm  long vegetation on tricuspid valve with resultant severe TR.  MRI left shoulder showed septic arthropathy, myositis and tenosynovitis.  He underwent washout of left shoulder by orthopedic surgery on 03/15/2020.  He was transferred to [facility] on 03/17/2020 for further evaluation of endocarditis by cardiothoracic surgery.  Subjective:  Sleeping.  No specific complaints.  States is bored of watching remains on TV but did read some chapters in the Bible overnight.  Objective: Vitals:   04/18/20 2001 04/19/20 0520  BP: 120/83 110/76  Pulse: 82 79  Resp: 18 16  Temp: 98.3 F (36.8 C) 97.6 F (36.4 C)  SpO2: 100% 100%    Intake/Output Summary (Last 24 hours) at 04/19/2020 1247 Last data filed at 04/19/2020 8119 Gross per 24 hour  Intake 180 ml  Output 1250 ml  Net -1070 ml   Filed Weights   04/17/20 0500 04/18/20 0610 04/19/20 0520  Weight: 64 kg 58.9 kg 58.7 kg    Exam:  Constitutional: NAD, calm, awake Respiratory: clear to auscultation bilaterally, Normal respiratory effort. RA  Cardiovascular: Regular rate and rhythm, no murmurs / rubs / gallops. No extremity edema. 2+ pedal pulses.  Abdomen: no tenderness, no masses palpated. Bowel sounds positive.  Musculoskeletal: no clubbing / cyanosis. No joint deformity upper and lower extremities. Normal muscle tone.  Neurologic: CN 2-12 grossly intact. Sensation intact, DTR normal. Strength 5/5 x all 4 extremities.  Psychiatric: Normal judgment and insight. Alert and oriented x  3. Normal mood.    Assessment/Plan: Severe sepsis secondary to MRSA and Serratia bacteremia-pulmonary septic emboli, TV endocarditis, left shoulder septic arthritis.   Patient is status post left shoulder arthroscopy with debridement irrigation for septic arthritis as per orthopedics on 03/15/2020.  Echocardiogram showed tricuspid vegetation.  TEE showed 2x2 mobile vegetation with severe TR.   MRI lumbar spine showed no evidence of epidural abscess.  Patient  refused MRI right shoulder ordered by ID.   CT surgery was consulted - s/p angiovac debridement on 03/21/2020.  Infectious disease following.  Continue IV vancomycin, Rocephin was changed to cefepime per ID. Repeat blood cultures drawn on 9/8 negative.  ID recommended 4 more weeks of IV antibiotic therapy starting from 03/21/2020 with vancomycin and cefepime until 04/22/2020 will be this coming Monday.  Rec to call ID back prior to discharge to reassess whether he requires outpatient regimen. 10/4 became occluded and required the lytics to resolve  TV endocarditis-s/p Angiovac   CT surgery following prn  Insomnia Had successful improvement in insomnia symptoms with use of prn Benadryl  Hyponatremia sodium is stable at 133 as of 9/23   Serum osmolality 281.  Substance use disorder Ongoing IV heroin use, last use was 3 days prior to admission.   Patient started on Suboxone 8/2 mg twice daily with as needed Suboxone per opiate withdrawal protocol.   Spoke with Dr. Erlinda Honguncan Vincent with Suboxone clinic who agreed patient is an appropriate candidate.  He requested that patient be given number to contact [facility] arrange for outpatient follow-up; patient has completed this request. OUD intake assessment completed by telephone. Patient has been scheduled for 04/24/2020 at 9:45 with Dr. Nedra HaiLee.  Pres[facility] for short-term [facility] on 10/14 with plans for family/patient to pick up on date of discharge 10/18 HIV antibodies were nonreactive Hepatitis C antibodies were reactive. Hepatitis C genotype 1b.  Patient can follow-up with GI as outpatient for hepatitis C.  Thrombocytopenia Likely due to sepsis.   Platelet count has improved to 136,000  Iron deficiency anemia Hemoglobin stable at 8.4  Patient's hemoglobin dropped after surgery, and was transfused 1 unit PRBC.    Anemia panel obtained on 9/15 showed iron 25, saturation 13%, TIBC 195, B12 440, reticulocyte  count percent 6.1, immature fraction 27.8.   9/22 initiated iron TID with twice daily scheduled Colace to prevent iron-induced constipation Haptoglobin is normal at 202 10/1 iron has increased from 25 to 38 therefore we will continue oral iron replacement.  No indication for IV iron    Data Reviewed: Basic Metabolic Panel: Recent Labs  Lab 04/14/20 0620 04/16/20 1225  NA  --  135  K  --  4.4  CL  --  96*  CO2  --  31  GLUCOSE  --  126*  BUN  --  25*  CREATININE 0.67 0.76  CALCIUM  --  9.7   Liver Function Tests: No results for input(s): AST, ALT, ALKPHOS, BILITOT, PROT, ALBUMIN in the last 168 hours. No results for input(s): LIPASE, AMYLASE in the last 168 hours. No results for input(s): AMMONIA in the last 168 hours. CBC: No results for input(s): WBC, NEUTROABS, HGB, HCT, MCV, PLT in the last 168 hours. Cardiac Enzymes: No results for input(s): CKTOTAL, CKMB, CKMBINDEX, TROPONINI in the last 168 hours. BNP (last 3 results) No results for input(s): BNP in the last 8760 hours.  ProBNP (last 3 results) No results for input(s): PROBNP in the last 8760 hours.  CBG: No results for input(s): GLUCAP in the last 168 hours.  No results found for this or any previous visit (from the past 240 hour(s)).   Studies: No results found.  Scheduled Meds:  buprenorphine-naloxone  1 tablet Sublingual BID   Chlorhexidine Gluconate Cloth  6 each Topical Daily   docusate sodium  100 mg Oral BID   enoxaparin (LOVENOX) injection  40 mg Subcutaneous Q24H   feeding supplement  237 mL Oral TID BM   ferrous sulfate  325 mg Oral TID WC   multivitamin with minerals  1 tablet Oral Daily   sodium chloride flush  10-40 mL Intracatheter Q12H   Continuous Infusions:  ceFEPime (MAXIPIME) IV 2 g (04/19/20 2800)   vancomycin 750 mg (04/19/20 0318)    Principal Problem:   MRSA bacteremia Active Problems:   Polysubstance dependence (HCC)   Hypokalemia   Thrombocytopenia (HCC)    Serratia septicemia (HCC)   Bipolar 1 disorder (HCC)   Heroin abuse (HCC)   Arthritis, septic, shoulder (HCC)   Endocarditis of tricuspid valve   Hepatitis C virus infection   IVDU (intravenous drug user)   Normocytic anemia   Cigarette smoker   Septic pulmonary embolism (HCC)   Pressure injury of skin   Lumbar back pain   Consultants:  Orthopedics  CVTS  Infectious disease  Procedures: 9/7 2D echocardiogram: 1. Left ventricular ejection fraction, by estimation, is 60 to 65%. The left ventricle has normal function. The left ventricle has no regional wall motion abnormalities. Left ventricular diastolic function could not be evaluated. 2. Right ventricular systolic function is normal. The right ventricular size is normal. There is normal pulmonary artery systolic pressure. The estimated right ventricular systolic pressure is 31.5 mmHg. 3. The mitral valve is normal in structure. No evidence of mitral valve regurgitation. No evidence of mitral stenosis. 4. There are several large shaggy mobile densities one of which measures 1.27 x 1.54cm on the TV worrisome for vegetations. The tricuspid valve is abnormal. 5. The aortic valve is normal in structure. Aortic valve regurgitation is not visualized. No aortic stenosis is present. 6. The inferior vena cava is normal in size with greater than 50% respiratory variability, suggesting right atrial pressure of 3 mmHg.  9/10 TEE: 1. Tricuspid valve endocarditis. 2. Left ventricular ejection fraction, by estimation, is 60 to 65%. The left ventricle has normal function. 3. Right ventricular systolic function is normal. The right ventricular size is normal. 4. No left atrial/left atrial appendage thrombus was detected. 5. Right atrial size was mildly dilated. 6. A small pericardial effusion is present. The pericardial effusion is circumferential. There is no evidence of cardiac tamponade. 7. The mitral valve is normal in structure. Trivial  mitral valve regurgitation. 8. There are 2 separate elongated valvular vegetations along the medial and lateral tricuspid valve leaflets that are approximately 2 cm in length, residing mostly in the right atrial surface. There is associated severe tricuspid regurgitation.. The tricuspid valve is abnormal. Tricuspid valve regurgitation is severe. 9. The aortic valve is normal in structure. Aortic valve regurgitation is not visualized. 10. There are very small shaggy thin mobile echodensities on the pulmonic valve surface. Trivial pulmonic valve regurgitation. This may represent pulmonic  9/10 left shoulder arthroscopy with extensive debridement, I/D, excisional debridement of the labrum and bursa as well as left shoulder subacromial bursectomy  9/16 application of angio VAC with intraoperative TEE: Right heart cannulation - Debridement of right atrial mass - Debridement of tricuspid valve vegetation  10/4 tPA  for occluded PICC  Antibiotics: Anti-infectives (From admission, onward)   Start     Dose/Rate Route Frequency Ordered Stop   04/17/20 0230  vancomycin (VANCOREADY) IVPB 750 mg/150 mL        750 mg 150 mL/hr over 60 Minutes Intravenous Every 12 hours 04/16/20 1457     04/12/20 0200  vancomycin (VANCOCIN) IVPB 1000 mg/200 mL premix  Status:  Discontinued        1,000 mg 200 mL/hr over 60 Minutes Intravenous Every 12 hours 04/11/20 1025 04/16/20 1457   04/01/20 2000  vancomycin (VANCOREADY) IVPB 1500 mg/300 mL  Status:  Discontinued        1,500 mg 150 mL/hr over 120 Minutes Intravenous Every 12 hours 04/01/20 1140 04/11/20 1025   03/22/20 2000  vancomycin (VANCOREADY) IVPB 1250 mg/250 mL  Status:  Discontinued        1,250 mg 166.7 mL/hr over 90 Minutes Intravenous Every 12 hours 03/22/20 1435 04/01/20 1140   03/20/20 1400  ceFEPIme (MAXIPIME) 2 g in sodium chloride 0.9 % 100 mL IVPB        2 g 200 mL/hr over 30 Minutes Intravenous Every 8 hours 03/20/20 0912 04/22/20 2359    03/19/20 2200  vancomycin (VANCOCIN) IVPB 1000 mg/200 mL premix  Status:  Discontinued        1,000 mg 200 mL/hr over 60 Minutes Intravenous Every 12 hours 03/19/20 1258 03/22/20 1435   03/18/20 1400  cefTRIAXone (ROCEPHIN) 2 g in sodium chloride 0.9 % 100 mL IVPB  Status:  Discontinued        2 g 200 mL/hr over 30 Minutes Intravenous Every 24 hours 03/18/20 1105 03/20/20 0909   03/12/20 2000  cefTRIAXone (ROCEPHIN) 2 g in sodium chloride 0.9 % 100 mL IVPB  Status:  Discontinued        2 g 200 mL/hr over 30 Minutes Intravenous Every 24 hours 03/11/20 2058 03/12/20 0937   03/12/20 1400  ceFEPIme (MAXIPIME) 2 g in sodium chloride 0.9 % 100 mL IVPB  Status:  Discontinued        2 g 200 mL/hr over 30 Minutes Intravenous Every 8 hours 03/12/20 0937 03/18/20 1105   03/12/20 0000  vancomycin (VANCOCIN) IVPB 1000 mg/200 mL premix  Status:  Discontinued        1,000 mg 200 mL/hr over 60 Minutes Intravenous Every 8 hours 03/11/20 1816 03/19/20 1258   03/11/20 1830  cefTRIAXone (ROCEPHIN) 2 g in sodium chloride 0.9 % 100 mL IVPB        2 g 200 mL/hr over 30 Minutes Intravenous  Once 03/11/20 1822 03/11/20 2030   03/11/20 1545  vancomycin (VANCOREADY) IVPB 1500 mg/300 mL        1,500 mg 150 mL/hr over 120 Minutes Intravenous  Once 03/11/20 1532 03/11/20 1819       Time spent: 15 minutes    Junious Silk ANP  [facility] Pager [phone]. If 7PM-7AM, please contact night-coverage at [email or web address] 04/19/2020, 12:47 PM  LOS: 39 days

## 2020-04-20 NOTE — Progress Notes (Addendum)
PROGRESS NOTE   As per NP Rennis Harding: 44 year old male with history of polysubstance use including IV drug use, bipolar disorder came to ED for evaluation of abnormal blood cultures. Patient was seen in [facility] on 03/06/2020 for evaluation management of heroin withdrawal. He was treated supportively. Blood cultures were obtained and grew MRSA and Serratia marcescens. He was called from home to come to ED for further evaluation. CTA chest PE showed multiple cavitary and noncavitary nodules consistent with septic emboli, bilateral lower lobe pneumonia and bilateral hilar adenopathy felt to be reactive. No pulmonary embolus was reported. Patient was started on IV ceftriaxone and vancomycin. TTE was performed which showed shaggy vegetations on tricuspid valve. TEE on 03/15/2020 showed 2 x 2 cm long vegetation on tricuspid valve with resultant severe TR. MRI left shoulder showed septic arthropathy, myositis and tenosynovitis. He underwent washout of left shoulder by orthopedic surgery on 03/15/2020. He was transferred to [facility] on 03/17/2020 for further evaluation of endocarditis by cardiothoracic surgery  LITTLETON HAUB  DPO:[id number] DOB: 1975/08/18 DOA: 03/11/2020 PCP: Patient, No Pcp Per   Assessment & Plan:   Principal Problem:   MRSA bacteremia Active Problems:   Polysubstance dependence (HCC)   Hypokalemia   Thrombocytopenia (HCC)   Serratia septicemia (HCC)   Bipolar 1 disorder (HCC)   Heroin abuse (HCC)   Arthritis, septic, shoulder (HCC)   Endocarditis of tricuspid valve   Hepatitis C virus infection   IVDU (intravenous drug user)   Normocytic anemia   Cigarette smoker   Septic pulmonary embolism (HCC)   Pressure injury of skin   Lumbar back pain   Severe sepsis: secondary to MRSA and Serratia bacteremia-pulmonary septic emboli, TV endocarditis, left shoulder septic arthritis. Hx of IVDA. S/p left shoulder arthroscopy with debridement irrigation for septic arthritis as per  orthopedics on 03/15/2020. TEE showed 2x2 mobile vegetation with severe TR. MRI lumbar spine showed no evidence of epidural abscess. Patient refused MRI right shoulder ordered by ID.  S/p angiovac debridement on 03/21/2020 as per CT surg. Continue on IV cefepime, vanco as per ID. Repeat blood cxs NGTD. ID recommended 4 more weeks of IV antibiotic therapy starting from 9/16/2021with vancomycin and cefepime until 04/22/2020. May need outpatient abxs as well as per ID (check w/ ID prior to d/c).   TV endocarditis: s/p angiovac as per CT surg. Continue on IV abxs as per ID.   Insomnia: continue w/ benadryl prn   Hyponatremia: resolved  Substance use disorder: w/ ongoing IV heroin use, last use was 3 days prior to admission. Started on suboxone BID w/ suboxone prn for opiate w/drawal Previous hospitalist spoke with Dr. Erlinda Hong with suboxone clinic who agreed patient is an appropriate candidate. Dr. Para March requested that patient be given number to contact [facility] arrange for outpatient follow-up & patient has completed this request. OUD intake assessment completed by telephone. Patient has been scheduled for 04/24/2020 at 9:45am with Dr. Nedra Hai. Prescription for short-term suboxone call to [facility] pharmacy on 10/14 with plans for family/patient to pick up on date of discharge 10/18. HIV antibodies were nonreactive. Hepatitis C antibodies were reactive. Hepatitis C genotype 1b. Patient can follow-up with GI as outpatient for hepatitis C.  Thrombocytopenia: resolved  IDA: s/p 1 unit of pRBCs transfused. Continue on iron supplements. Will continue to monitor H&H    DVT prophylaxis: lovenox  Code Status: full  Family Communication:  Disposition Plan: likely d/c back home  Status is: Inpatient  Remains inpatient appropriate because:IV  treatments appropriate due to intensity of illness or inability to take PO, awaiting for pt to complete IV abxs inpatient as pt has hx of  IVDA   Dispo: The patient is from: Home              Anticipated d/c is to: Home              Anticipated d/c date is: 2 days              Patient currently is not medically stable to d/c.   Consultants:   ID   Procedures:    Antimicrobials: cefepime, vanco    Subjective: Pt c/o fatigue   Objective: Vitals:   04/19/20 0520 04/19/20 1405 04/19/20 2012 04/20/20 0503  BP: 110/76  103/74 115/78  Pulse: 79  88 80  Resp: 16 16 18 18   Temp: 97.6 F (36.4 C) 97.9 F (36.6 C) 98.6 F (37 C) 98 F (36.7 C)  TempSrc: Oral Oral Oral Oral  SpO2: 100%  100% 100%  Weight: 58.7 kg   58.9 kg  Height:        Intake/Output Summary (Last 24 hours) at 04/20/2020 0842 Last data filed at 04/20/2020 0301 Gross per 24 hour  Intake 240 ml  Output 1275 ml  Net -1035 ml   Filed Weights   04/18/20 0610 04/19/20 0520 04/20/20 0503  Weight: 58.9 kg 58.7 kg 58.9 kg    Examination:  General exam: Appears calm and comfortable. Disheveled  Respiratory system: Clear to auscultation. Respiratory effort normal. Cardiovascular system: S1 & S2 +. No rubs, gallops or clicks.. Gastrointestinal system: Abdomen is nondistended, soft and nontender. Normal bowel sounds heard. Central nervous system: Alert and oriented. Moves all 4 extremities  Psychiatry: Judgement and insight appear normal. Flat mood and affect.     Data Reviewed: I have personally reviewed following labs and imaging studies  CBC: No results for input(s): WBC, NEUTROABS, HGB, HCT, MCV, PLT in the last 168 hours. Basic Metabolic Panel: Recent Labs  Lab 04/14/20 0620 04/16/20 1225  NA  --  135  K  --  4.4  CL  --  96*  CO2  --  31  GLUCOSE  --  126*  BUN  --  25*  CREATININE 0.67 0.76  CALCIUM  --  9.7   GFR: Estimated Creatinine Clearance: 96.1 mL/min (by C-G formula based on SCr of 0.76 mg/dL). Liver Function Tests: No results for input(s): AST, ALT, ALKPHOS, BILITOT, PROT, ALBUMIN in the last 168 hours. No  results for input(s): LIPASE, AMYLASE in the last 168 hours. No results for input(s): AMMONIA in the last 168 hours. Coagulation Profile: No results for input(s): INR, PROTIME in the last 168 hours. Cardiac Enzymes: No results for input(s): CKTOTAL, CKMB, CKMBINDEX, TROPONINI in the last 168 hours. BNP (last 3 results) No results for input(s): PROBNP in the last 8760 hours. HbA1C: No results for input(s): HGBA1C in the last 72 hours. CBG: No results for input(s): GLUCAP in the last 168 hours. Lipid Profile: No results for input(s): CHOL, HDL, LDLCALC, TRIG, CHOLHDL, LDLDIRECT in the last 72 hours. Thyroid Function Tests: No results for input(s): TSH, T4TOTAL, FREET4, T3FREE, THYROIDAB in the last 72 hours. Anemia Panel: No results for input(s): VITAMINB12, FOLATE, FERRITIN, TIBC, IRON, RETICCTPCT in the last 72 hours. Sepsis Labs: No results for input(s): PROCALCITON, LATICACIDVEN in the last 168 hours.  No results found for this or any previous visit (from the past 240 hour(s)).  Radiology Studies: No results found.      Scheduled Meds: . buprenorphine-naloxone  1 tablet Sublingual BID  . Chlorhexidine Gluconate Cloth  6 each Topical Daily  . docusate sodium  100 mg Oral BID  . enoxaparin (LOVENOX) injection  40 mg Subcutaneous Q24H  . feeding supplement  237 mL Oral TID BM  . ferrous sulfate  325 mg Oral TID WC  . multivitamin with minerals  1 tablet Oral Daily  . sodium chloride flush  10-40 mL Intracatheter Q12H   Continuous Infusions: . ceFEPime (MAXIPIME) IV 2 g (04/20/20 0512)  . vancomycin 750 mg (04/20/20 0256)     LOS: 40 days    Time spent: 30 mins    Charise Killian, MD [facility] Pager [phone]  If 7PM-7AM, please contact night-coverage [email or web address] 04/20/2020, 8:42 AM

## 2020-04-20 NOTE — Progress Notes (Signed)
Pharmacy Antibiotic Note  Robert Kramer is a 44 y.o. 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 Angiovac procedure completed 9/16. Additionally, patient has L shoulder septic arthritis s/p I&D 9/10 with culture growing rare MRSA. Pharmacy has been consulted for cefepime dosing for Serratia bacteremia/endocarditis (given ampC organism).  Plans to continue antibiotics until 04/22/20. Vancomycin recently dose adjusted on 10/13 to 750 mg Q12H for elevated trough 23 mcg/ml on 10/12, however drawn ~1h early so true trough is closer to 21 mcg/ml. SCr wnl.  Plan: Continue cefepime 2g q8 hours Continue vancomycin to 750mg  IV q12h Consider vancomycin trough in 2-3 days to assess steady state  Height: 5\' 11"  (180.3 cm) Weight: 58.9 kg (129 lb 13.6 oz) IBW/kg (Calculated) : 75.3  Temp (24hrs), Avg:98.2 F (36.8 C), Min:97.9 F (36.6 C), Max:98.6 F (37 C)  Recent Labs  Lab 04/14/20 0620 04/16/20 1225  CREATININE 0.67 0.76  VANCOTROUGH  --  23*    Estimated Creatinine Clearance: 96.1 mL/min (by C-G formula based on SCr of 0.76 mg/dL).    Allergies  Allergen Reactions  . Tramadol Other (See Comments)    Upset stomach    Antimicrobials this admission: Vancomycin 9/6 >> (10/18) Ceftriaxone 9/6 >>9/7, 9/13>>9/15 Cefepime 9/7 >>9/13, 9/15>>(10/18)  Microbiology results: 9/1 BCx x1: MRSA 9/1 BCx x1: Serratia Marcescens (S=Rocephin, Bactrim, Cipro; R=Cefazolin) 9/6 UCx: mult sp F 9/6 BCx: MRSA F 9/6 COVID: neg 9/8 Hep A reactive 9/8 Hep B NR 9/8 Hep C reactive 9/8 BCx2 ng 9/10 MRSA PCR neg MSSA PCR neg 9/10 L shoulder synovial fluid: rare SA 9/16 tissue/veg: GPC - reincubate 9/25 fungus neg, culture w/ stain pending   Thank you for allowing pharmacy to be a part of this patient's care.  10/16, PharmD PGY2 Ambulatory Care Resident [facility]arm[facility]  .

## 2020-04-21 LAB — COMPREHENSIVE METABOLIC PANEL
ALT: 99 U/L — ABNORMAL HIGH (ref 0–44)
AST: 122 U/L — ABNORMAL HIGH (ref 15–41)
Albumin: 1.9 g/dL — ABNORMAL LOW (ref 3.5–5.0)
Alkaline Phosphatase: 97 U/L (ref 38–126)
Anion gap: 6 (ref 5–15)
BUN: 27 mg/dL — ABNORMAL HIGH (ref 6–20)
CO2: 30 mmol/L (ref 22–32)
Calcium: 9.8 mg/dL (ref 8.9–10.3)
Chloride: 98 mmol/L (ref 98–111)
Creatinine, Ser: 0.78 mg/dL (ref 0.61–1.24)
GFR, Estimated: >60 mL/min (ref >60)
Glucose, Bld: 128 mg/dL — ABNORMAL HIGH (ref 70–99)
Potassium: 3.9 mmol/L (ref 3.5–5.1)
Sodium: 134 mmol/L — ABNORMAL LOW (ref 135–145)
Total Bilirubin: 0.9 mg/dL (ref 0.3–1.2)
Total Protein: 8.2 g/dL — ABNORMAL HIGH (ref 6.5–8.1)

## 2020-04-21 LAB — CBC
HCT: 34.7 % — ABNORMAL LOW (ref 39.0–52.0)
Hemoglobin: 10.4 g/dL — ABNORMAL LOW (ref 13.0–17.0)
MCH: 26.8 pg (ref 26.0–34.0)
MCHC: 30 g/dL (ref 30.0–36.0)
MCV: 89.4 fL (ref 80.0–100.0)
Platelets: 76 10*3/uL — ABNORMAL LOW (ref 150–400)
RBC: 3.88 MIL/uL — ABNORMAL LOW (ref 4.22–5.81)
RDW: 15.9 % — ABNORMAL HIGH (ref 11.5–15.5)
WBC: 5.4 10*3/uL (ref 4.0–10.5)
nRBC: 0 % (ref 0.0–0.2)

## 2020-04-21 NOTE — Progress Notes (Signed)
PROGRESS NOTE   As per NP Rennis Harding: 44 year old male with history of polysubstance use including IV drug use, bipolar disorder came to ED for evaluation of abnormal blood cultures. Patient was seen in [facility] on 03/06/2020 for evaluation management of heroin withdrawal. He was treated supportively. Blood cultures were obtained and grew MRSA and Serratia marcescens. He was called from home to come to ED for further evaluation. CTA chest PE showed multiple cavitary and noncavitary nodules consistent with septic emboli, bilateral lower lobe pneumonia and bilateral hilar adenopathy felt to be reactive. No pulmonary embolus was reported. Patient was started on IV ceftriaxone and vancomycin. TTE was performed which showed shaggy vegetations on tricuspid valve. TEE on 03/15/2020 showed 2 x 2 cm long vegetation on tricuspid valve with resultant severe TR. MRI left shoulder showed septic arthropathy, myositis and tenosynovitis. He underwent washout of left shoulder by orthopedic surgery on 03/15/2020. He was [facility] to [facility] on 03/17/2020 for further evaluation of endocarditis by cardiothoracic surgery  Robert Kramer  XHB:[id number] DOB: [birth year] DOA: 03/11/2020 PCP: Patient, No Pcp Per   Assessment & Plan:   Principal Problem:   MRSA bacteremia Active Problems:   Polysubstance dependence (HCC)   Hypokalemia   Thrombocytopenia (HCC)   Serratia septicemia (HCC)   Bipolar 1 disorder (HCC)   Heroin abuse (HCC)   Arthritis, septic, shoulder (HCC)   Endocarditis of tricuspid valve   Hepatitis C virus infection   IVDU (intravenous drug user)   Normocytic anemia   Cigarette smoker   Septic pulmonary embolism (HCC)   Pressure injury of skin   Lumbar back pain   Severe sepsis: secondary to MRSA and Serratia bacteremia-pulmonary septic emboli, TV endocarditis, left shoulder septic arthritis. Hx of IVDA. S/p left shoulder arthroscopy with debridement irrigation for septic arthritis as per  orthopedics on 03/15/2020. TEE showed 2x2 mobile vegetation with severe TR. MRI lumbar spine showed no evidence of epidural abscess. Patient refused MRI right shoulder ordered by ID.  S/p angiovac debridement on 03/21/2020 as per CT surg. Continue on IV cefepime, vanco as per ID. Repeat blood cxs NGTD. ID recommended 4 more weeks of IV antibiotic therapy starting from 9/16/2021with vancomycin and cefepime until 04/22/2020. May need outpatient abxs as well as per ID (check w/ ID prior to d/c).   TV endocarditis: s/p angiovac as per CT surg. Continue on IV abxs as per ID.   Insomnia: continue w/ benadryl prn   Hyponatremia: resolved  Substance use disorder: w/ ongoing IV heroin use, last use was 3 days prior to admission. Started on suboxone BID w/ suboxone prn for opiate w/drawal Previous hospitalist spoke with Dr. Erlinda Hong with suboxone clinic who agreed patient is an appropriate candidate. Dr. Para March requested that patient be given number to contact [facility] arrange for outpatient follow-up & patient has completed this request. OUD intake assessment completed by telephone. Patient has been scheduled for 04/24/2020 at 9:45am with Dr. Nedra Hai. Prescription for short-term suboxone call to [facility] pharmacy on 10/14 with plans for family/patient to pick up on date of discharge 10/18. HIV antibodies were nonreactive. Hepatitis C antibodies were reactive. Hepatitis C genotype 1b. Patient can follow-up with GI as outpatient for hepatitis C.  Thrombocytopenia: resolved  IDA: s/p 1 unit of pRBCs transfused. Continue on iron supplements. Will continue to monitor H&H    DVT prophylaxis: lovenox  Code Status: full  Family Communication:  Disposition Plan: likely d/c back home  Status is: Inpatient  Remains inpatient appropriate because:IV  treatments appropriate due to intensity of illness or inability to take PO, awaiting for pt to complete IV abxs inpatient as pt has hx of  IVDA   Dispo: The patient is from: Home              Anticipated d/c is to: Home              Anticipated d/c date is: 2 days              Patient currently is not medically stable to d/c.   Consultants:   ID  Procedures:  Echocardiogram 03/21/2020.  Antimicrobials:  Anti-infectives (From admission, onward)   Start     Dose/Rate Route Frequency Ordered Stop   04/17/20 0230  vancomycin (VANCOREADY) IVPB 750 mg/150 mL        750 mg 150 mL/hr over 60 Minutes Intravenous Every 12 hours 04/16/20 1457     04/12/20 0200  vancomycin (VANCOCIN) IVPB 1000 mg/200 mL premix  Status:  Discontinued        1,000 mg 200 mL/hr over 60 Minutes Intravenous Every 12 hours 04/11/20 1025 04/16/20 1457   04/01/20 2000  vancomycin (VANCOREADY) IVPB 1500 mg/300 mL  Status:  Discontinued        1,500 mg 150 mL/hr over 120 Minutes Intravenous Every 12 hours 04/01/20 1140 04/11/20 1025   03/22/20 2000  vancomycin (VANCOREADY) IVPB 1250 mg/250 mL  Status:  Discontinued        1,250 mg 166.7 mL/hr over 90 Minutes Intravenous Every 12 hours 03/22/20 1435 04/01/20 1140   03/20/20 1400  ceFEPIme (MAXIPIME) 2 g in sodium chloride 0.9 % 100 mL IVPB        2 g 200 mL/hr over 30 Minutes Intravenous Every 8 hours 03/20/20 0912 04/22/20 2359   03/19/20 2200  vancomycin (VANCOCIN) IVPB 1000 mg/200 mL premix  Status:  Discontinued        1,000 mg 200 mL/hr over 60 Minutes Intravenous Every 12 hours 03/19/20 1258 03/22/20 1435   03/18/20 1400  cefTRIAXone (ROCEPHIN) 2 g in sodium chloride 0.9 % 100 mL IVPB  Status:  Discontinued        2 g 200 mL/hr over 30 Minutes Intravenous Every 24 hours 03/18/20 1105 03/20/20 0909   03/12/20 2000  cefTRIAXone (ROCEPHIN) 2 g in sodium chloride 0.9 % 100 mL IVPB  Status:  Discontinued        2 g 200 mL/hr over 30 Minutes Intravenous Every 24 hours 03/11/20 2058 03/12/20 0937   03/12/20 1400  ceFEPIme (MAXIPIME) 2 g in sodium chloride 0.9 % 100 mL IVPB  Status:  Discontinued         2 g 200 mL/hr over 30 Minutes Intravenous Every 8 hours 03/12/20 0937 03/18/20 1105   03/12/20 0000  vancomycin (VANCOCIN) IVPB 1000 mg/200 mL premix  Status:  Discontinued        1,000 mg 200 mL/hr over 60 Minutes Intravenous Every 8 hours 03/11/20 1816 03/19/20 1258   03/11/20 1830  cefTRIAXone (ROCEPHIN) 2 g in sodium chloride 0.9 % 100 mL IVPB        2 g 200 mL/hr over 30 Minutes Intravenous  Once 03/11/20 1822 03/11/20 2030   03/11/20 1545  vancomycin (VANCOREADY) IVPB 1500 mg/300 mL        1,500 mg 150 mL/hr over 120 Minutes Intravenous  Once 03/11/20 1532 03/11/20 1819        Subjective: Pt c/o fatigue. 04/21/2020: Pt today is alert and  awake and oriented and denies any questions. Also denies any pain discomfort or chest pain or sob or ant stomach issues.   Objective: Vitals:   04/20/20 0503 04/20/20 1327 04/20/20 1906 04/21/20 0412  BP: 115/78 106/82 112/84 115/77  Pulse: 80 75 85 78  Resp: 18 18 18 18   Temp: 98 F (36.7 C) 98.3 F (36.8 C) 98 F (36.7 C) 98.2 F (36.8 C)  TempSrc: Oral Oral Oral Oral  SpO2: 100% 98% 100% 100%  Weight: 58.9 kg   57.9 kg  Height:        Intake/Output Summary (Last 24 hours) at 04/21/2020 1419 Last data filed at 04/21/2020 1107 Gross per 24 hour  Intake 180 ml  Output 2900 ml  Net -2720 ml   Filed Weights   04/19/20 0520 04/20/20 0503 04/21/20 0412  Weight: 58.7 kg 58.9 kg 57.9 kg    Examination:  General exam: Appears calm and comfortable. Disheveled  Respiratory system: Clear to auscultation. Respiratory effort normal. Cardiovascular system: S1 & S2 +. No rubs, gallops or clicks.. Gastrointestinal system: Abdomen is nondistended, soft and nontender. Normal bowel sounds heard. Central nervous system: Alert and oriented. Moves all 4 extremities  Psychiatry: Judgement and insight appear normal. Flat mood and affect.    Data Reviewed: I have personally reviewed following labs and imaging studies  CBC: Recent Labs   Lab 04/21/20 0556  WBC 5.4  HGB 10.4*  HCT 34.7*  MCV 89.4  PLT 76*   Basic Metabolic Panel: Recent Labs  Lab 04/16/20 1225 04/21/20 0556  NA 135 134*  K 4.4 3.9  CL 96* 98  CO2 31 30  GLUCOSE 126* 128*  BUN 25* 27*  CREATININE 0.76 0.78  CALCIUM 9.7 9.8   GFR: Estimated Creatinine Clearance: 94.5 mL/min (by C-G formula based on SCr of 0.78 mg/dL). Liver Function Tests: Recent Labs  Lab 04/21/20 0556  AST 122*  ALT 99*  ALKPHOS 97  BILITOT 0.9  PROT 8.2*  ALBUMIN 1.9*   Radiology Studies: No results found.  Scheduled Meds: . buprenorphine-naloxone  1 tablet Sublingual BID  . Chlorhexidine Gluconate Cloth  6 each Topical Daily  . docusate sodium  100 mg Oral BID  . enoxaparin (LOVENOX) injection  40 mg Subcutaneous Q24H  . feeding supplement  237 mL Oral TID BM  . ferrous sulfate  325 mg Oral TID WC  . multivitamin with minerals  1 tablet Oral Daily  . sodium chloride flush  10-40 mL Intracatheter Q12H   Continuous Infusions: . ceFEPime (MAXIPIME) IV 2 g (04/21/20 1349)  . vancomycin 750 mg (04/21/20 0330)     LOS: 41 days   Time spent: 30 mins  04/23/20, MD [facility] Pager [phone]. If 7PM-7AM, please contact night-coverage [email or web address] 04/21/2020, 2:19 PM

## 2020-04-22 ENCOUNTER — Other Ambulatory Visit (HOSPITAL_COMMUNITY): Payer: Self-pay | Admitting: Nurse Practitioner

## 2020-04-22 DIAGNOSIS — M545 Low back pain, unspecified: Secondary | ICD-10-CM

## 2020-04-22 MED ORDER — ADULT MULTIVITAMIN W/MINERALS CH: 1.0000 | ORAL_TABLET | Freq: Every day | ORAL | 3 refills | Status: DC

## 2020-04-22 MED ORDER — ACETAMINOPHEN 500 MG PO TABS: 1000.0000 mg | ORAL_TABLET | Freq: Four times a day (QID) | ORAL | 0 refills | Status: DC | PRN

## 2020-04-22 MED ORDER — METHOCARBAMOL 500 MG PO TABS: 500.0000 mg | ORAL_TABLET | Freq: Three times a day (TID) | ORAL | 0 refills | Status: DC | PRN

## 2020-04-22 MED ORDER — DOCUSATE SODIUM 100 MG PO CAPS: 100.0000 mg | ORAL_CAPSULE | Freq: Two times a day (BID) | ORAL | 0 refills | Status: DC

## 2020-04-22 MED ORDER — FERROUS SULFATE 325 (65 FE) MG PO TABS
325.0000 mg | ORAL_TABLET | Freq: Three times a day (TID) | ORAL | 3 refills | Status: DC
Start: 2020-04-22 — End: 2020-04-24

## 2020-04-22 MED ORDER — POLYETHYLENE GLYCOL 3350 17 G PO PACK: 17.0000 g | PACK | Freq: Every day | ORAL | 0 refills | Status: DC | PRN

## 2020-04-22 MED ORDER — ENSURE ENLIVE PO LIQD: 237.0000 mL | Freq: Three times a day (TID) | ORAL | 12 refills | Status: DC

## 2020-04-22 MED FILL — DOCUSATE SODIUM 100 MG CAPS: 100 | 5 days supply | Qty: 10 | Fill #0

## 2020-04-22 MED FILL — METHOCARBAMOL 500 MG TABS: 500 | 5 days supply | Qty: 15 | Fill #0

## 2020-04-22 MED FILL — FERROUS SULFATE 325 MG TAB: 325 (65 FE) | 30 days supply | Qty: 90 | Fill #0

## 2020-04-22 MED FILL — POLYETHYLENE GLYCOL 3350 PO: 17 | 14 days supply | Qty: 238 | Fill #0

## 2020-04-22 NOTE — Progress Notes (Signed)
Occupational Therapy Treatment Patient Details Name: Robert Kramer MRN: [id number] DOB: [birth year] Today's Date: 04/22/2020    History of present illness Pt is a 44 y.o. male admitted 03/11/20 for evaluation of abnormal blood cultures. Chest CTA shows multiple cavitary and noncavitary nodules consistent with septic emboli; bilateral PNA, bilateral hilar adenopathy. 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 Pt with L shoulder septic arthritis s/p I&D 9/10. S/p catheter-based debridement of tricuspid valve 9/16. PMH includes IVDA, bipolar disorder.  Underwent AngioVac procedure 9/16.   OT comments  Focus of session on further assessment of LUE. Pt with abnormal scapulohumeral rythm and has symptoms consistent with development of adhesive capsulitis in additional to LUE weakness. Will follow up with additional visit to further educate on HEP. Anticipate DC this pm.   Follow Up Recommendations  Outpatient OT;Supervision - Intermittent (outpt follow up for L shoulder)    Equipment Recommendations  None recommended by OT    Recommendations for Other Services      Precautions / Restrictions Precautions Precautions: Fall       Mobility Bed Mobility                  Transfers                      Balance                                           ADL either performed or assessed with clinical judgement   ADL                                         General ADL Comments: mod I     Vision       Perception     Praxis      Cognition Arousal/Alertness: Awake/alert Behavior During Therapy: WFL for tasks assessed/performed Overall Cognitive Status: Within Functional Limits for tasks assessed                                 General Comments: most likley at baseline        Exercises Other Exercises Other Exercises: Scapular retraction x 10 Other Exercises: scapular depression x 10 Other  Exercises: bed elevated to simulate table - forward flexion; Abd and shoulder scaption slides   Shoulder Instructions       General Comments Further assessed LUE; Pt with abnormal scapulohumeral rythm with apparent scapular weakness adn tight shoulder capsule; pt with limited ROM in flex; ER; Abd and IR with symptoms consistent with adhesive capsulitis. AROM FF 9-60; [name] to 80; AROM Abd 30; AROM 0-45; ER - unablet o reach behind had; IR unable to reach hip    Pertinent Vitals/ Pain       Pain Assessment: Faces Faces Pain Scale: Hurts even more Pain Location: L shoulder with exercise/stretching Pain Descriptors / Indicators: Grimacing;Guarding;Moaning Pain Intervention(s): Limited activity within patient's tolerance  Home Living                                          Prior Functioning/Environment  Frequency  Min 2X/week        Progress Toward Goals  OT Goals(current goals can now be found in the care plan section)  Progress towards OT goals: Progressing toward goals  Acute Rehab OT Goals Patient Stated Goal: return home OT Goal Formulation: With patient Time For Goal Achievement: 05/01/20 Potential to Achieve Goals: Good ADL Goals Pt Will Perform Upper Body Dressing: Independently;sitting Pt Will Perform Lower Body Dressing: Independently;sit to/from stand Pt Will Transfer to Toilet: Independently Pt Will Perform Toileting - Clothing Manipulation and hygiene: Independently Pt/caregiver will Perform Home Exercise Program: Increased ROM;Increased strength;Left upper extremity;With written HEP provided  Plan Discharge plan remains appropriate;Frequency needs to be updated    Co-evaluation                 AM-PAC OT "6 Clicks" Daily Activity     Outcome Measure   Help from another person eating meals?: None Help from another person taking care of personal grooming?: None Help from another person toileting, which includes  using toliet, bedpan, or urinal?: None Help from another person bathing (including washing, rinsing, drying)?: None Help from another person to put on and taking off regular upper body clothing?: None Help from another person to put on and taking off regular lower body clothing?: None 6 Click Score: 24    End of Session    Pain - Right/Left: Left Pain - part of body: Shoulder   Activity Tolerance Patient tolerated treatment well   Patient Left in bed;with call bell/phone within reach   Nurse Communication Other (comment) (recommendation for outpt OT)        Time: 0354-6568 OT Time Calculation (min): 20 min  Charges: OT General Charges $OT Visit: 1 Visit OT Treatments $Therapeutic Exercise: 8-22 mins  Luisa Dago, OT/L   Acute OT Clinical Specialist Acute Rehabilitation Services Pager [phone] Office [phone]    [facility] 04/22/2020, 4:06 PM

## 2020-04-22 NOTE — Discharge Summary (Addendum)
Physician Discharge Summary  Robert Kramer WEX:[id number] DOB: Aug 30, 1975 DOA: 03/11/2020  PCP: Patient, No Pcp Per  Admit date: 03/11/2020 Discharge date: 04/22/2020  Time spent: 48 minutes  Recommendations for Outpatient Follow-up:  Patient will continue any oral antibiotics as recommended by infectious disease clinic. Patient will establish with the [facility]-he will be given the number to contact to arrange for follow-up appointment Patient was started on Suboxone this admission and has been given a 5-day prescription on date of discharge.  He has completed preclinic evaluation and will follow up with the [place] internal medicine clinic/Suboxone clinic on 10/20 at 9:45 AM.  He will be seeing Dr. Nedra Hai. Patient was found have iron deficiency anemia secondary to preadmission malnutrition in the context of substance abuse.  He has been started on iron replacement along with Colace and as needed MiraLAX to help with iron related constipation as well as constipation related to Suboxone. Patient underwent left shoulder arthroscopy with irrigation and debridement on 9/10.  He will need to follow-up with Dr. Dion Saucier for routine postoperative evaluation Patient also had tricuspid valve endocarditis and underwent application of angio VAC on 9/16.  He will need to follow-up with Dr. Cliffton Asters for routine postoperative follow-up Because of incidental finding of hepatitis C recommendation was for patient to follow-up/establish with a gastroenterologist in the outpatient setting.  Recommend that PCP assist in arranging for this outpatient follow-up.   Discharge Diagnoses:  Principal Problem:   MRSA bacteremia Active Problems:   Polysubstance dependence (HCC)   Hypokalemia   Thrombocytopenia (HCC)   Serratia septicemia (HCC)   Bipolar 1 disorder (HCC)   Heroin abuse (HCC)   Arthritis, septic, shoulder (HCC)   Endocarditis of tricuspid valve   Hepatitis C virus  infection   IVDU (intravenous drug user)   Normocytic anemia   Cigarette smoker   Septic pulmonary embolism (HCC)   Pressure injury of skin   Lumbar back pain   Discharge Condition: Stable  Diet recommendation: Regular  Filed Weights   04/20/20 0503 04/21/20 0412 04/22/20 0428  Weight: 58.9 kg 57.9 kg 57.5 kg    History of present illness:  44 year old male with history of polysubstance use including IV drug use, bipolar disorder came to ED for evaluation of abnormal blood cultures. Patient was seen in [facility] on 03/06/2020 for evaluation management of heroin withdrawal.  He was treated supportively.  Blood cultures were obtained and grew MRSA and Serratia marcescens.  He was called from home to come to ED for further evaluation.  CTA chest PE showed multiple cavitary and noncavitary nodules consistent with septic emboli, bilateral lower lobe pneumonia and bilateral hilar adenopathy felt to be reactive.  No pulmonary embolus was reported.  Patient was started on IV ceftriaxone and vancomycin.  TTE was performed which showed shaggy vegetations on tricuspid valve.  TEE on 03/15/2020 showed 2 x 2 cm long vegetation on tricuspid valve with resultant severe TR.  MRI left shoulder showed septic arthropathy, myositis and tenosynovitis.  He underwent washout of left shoulder by orthopedic surgery on 03/15/2020.  He was transferred to [facility] on 03/17/2020 for further evaluation of endocarditis by cardiothoracic surgery.  Hospital Course:  Severe sepsis secondary to MRSA and Serratia bacteremia-pulmonary septic emboli, TV endocarditis, left shoulder septic arthritis.   Status post left shoulder arthroscopy with debridement irrigation for septic arthritis as per orthopedics on 03/15/2020.  Echocardiogram showed tricuspid vegetation.  TEE showed 2x2 mobile vegetation with severe TR.  MRI lumbar spine showed no evidence of epidural abscess.  Patient refused MRI right shoulder ordered by ID.   CT surgery was  consulted - s/p angiovac debridement on 03/21/2020.  Infectious disease following.  Continue IV vancomycin; Rocephin was changed to cefepime per ID. Repeat blood cultures drawn on 9/8 negative.  ID recommended 4 more weeks of IV antibiotic therapy starting from 03/21/2020 with vancomycin and cefepime.  Antibiotics subsequently completed on 04/22/2020 will be this coming Monday.  ID contacted date of discharge no indication to add oral antibiotics to discharge regimen and no indication to follow-up with the clinic at this juncture.  She can follow-up with PCP and can be referred back to the infectious disease clinic if any issue   TV endocarditis-s/p Angiovac   CT surgery following -given office contact information to schedule follow-up appointment after discharge  Insomnia Had successful improvement in insomnia symptoms with use of prn Benadryl   Hyponatremia sodium is stable at 133 as of 9/23   Serum osmolality 281.   Substance use disorder Ongoing IV heroin use, last use was 3 days prior to admission.   Patient started on Suboxone 8/2 mg twice daily with as needed Suboxone per opiate withdrawal protocol.   Spoke with Dr. Erlinda Honguncan Vincent with Suboxone clinic who agreed patient is an appropriate candidate.  He requested that patient be given number to contact [facility] arrange for outpatient follow-up; patient has completed this request. OUD intake assessment completed by telephone. Patient has been scheduled for 04/24/2020 at 9:45 with Dr. Nedra HaiLee.  Prescription for short-term [facility] on 10/14 with plans for family/patient to pick up on date of discharge 10/18 HIV antibodies were nonreactive Hepatitis C antibodies were reactive. Hepatitis C genotype 1b.  Patient can follow-up with GI as outpatient for hepatitis C.   Thrombocytopenia Likely due to sepsis.   Platelet count has improved to 136,000   Iron deficiency anemia Hemoglobin stable at 8.4  Patient's  hemoglobin dropped after surgery, and was transfused 1 unit PRBC.    Anemia panel obtained on 9/15 showed iron 25, saturation 13%, TIBC 195, B12 440, reticulocyte count percent 6.1, immature fraction 27.8.   9/22 initiated iron TID with twice daily scheduled Colace to prevent iron-induced constipation Haptoglobin is normal at 202 10/1 iron has increased from 25 to 38 therefore we will continue oral iron replacement.  No indication for IV iron    Procedures: 9/7 2D echocardiogram: 1. Left ventricular ejection fraction, by estimation, is 60 to 65%. The left ventricle has normal function. The left ventricle has no regional wall motion abnormalities. Left ventricular diastolic function could not be evaluated. 2. Right ventricular systolic function is normal. The right ventricular size is normal. There is normal pulmonary artery systolic pressure. The estimated right ventricular systolic pressure is 31.5 mmHg. 3. The mitral valve is normal in structure. No evidence of mitral valve regurgitation. No evidence of mitral stenosis. 4. There are several large shaggy mobile densities one of which measures 1.27 x 1.54cm on the TV worrisome for vegetations. The tricuspid valve is abnormal. 5. The aortic valve is normal in structure. Aortic valve regurgitation is not visualized. No aortic stenosis is present. 6. The inferior vena cava is normal in size with greater than 50% respiratory variability, suggesting right atrial pressure of 3 mmHg.   9/10 TEE: 1. Tricuspid valve endocarditis. 2. Left ventricular ejection fraction, by estimation, is 60 to 65%. The left ventricle has normal function. 3. Right ventricular  systolic function is normal. The right ventricular size is normal. 4. No left atrial/left atrial appendage thrombus was detected. 5. Right atrial size was mildly dilated. 6. A small pericardial effusion is present. The pericardial effusion is circumferential. There is no evidence of cardiac  tamponade. 7. The mitral valve is normal in structure. Trivial mitral valve regurgitation. 8. There are 2 separate elongated valvular vegetations along the medial and lateral tricuspid valve leaflets that are approximately 2 cm in length, residing mostly in the right atrial surface. There is associated severe tricuspid regurgitation.. The tricuspid valve is abnormal. Tricuspid valve regurgitation is severe. 9. The aortic valve is normal in structure. Aortic valve regurgitation is not visualized. 10. There are very small shaggy thin mobile echodensities on the pulmonic valve surface. Trivial pulmonic valve regurgitation. This may represent pulmonic   9/10 left shoulder arthroscopy with extensive debridement, I/D, excisional debridement of the labrum and bursa as well as left shoulder subacromial bursectomy   9/16 application of angio VAC with intraoperative TEE: Right heart cannulation - Debridement of right atrial mass - Debridement of tricuspid valve vegetation   10/4 tPA for occluded PICC  Consultations: Orthopedics CVTS Infectious disease  Discharge Exam: Vitals:   04/22/20 0431 04/22/20 0844  BP: 99/75 104/72  Pulse: 82   Resp: 18   Temp: 98.7 F (37.1 C)   SpO2: 100% 98%   Constitutional: NAD, calm, awake Respiratory: clear to auscultation bilaterally, Normal respiratory effort. RA  Cardiovascular: Regular rate and rhythm, no murmurs / rubs / gallops. No extremity edema. 2+ pedal pulses.  Abdomen: no tenderness, no masses palpated. Bowel sounds positive.  Musculoskeletal: no clubbing / cyanosis. No joint deformity upper and lower extremities. Normal muscle tone.  Neurologic: CN 2-12 grossly intact. Sensation intact, DTR normal. Strength 5/5 x all 4 extremities.  Psychiatric: Normal judgment and insight. Alert and oriented x 3. Normal mood.   Discharge Instructions     Allergies as of 04/22/2020       Reactions   Tramadol Other (See Comments)   Upset stomach         Medication List     STOP taking these medications    hydrOXYzine 25 MG tablet Commonly known as: ATARAX/VISTARIL       TAKE these medications    acetaminophen 500 MG tablet Commonly known as: TYLENOL Take 2 tablets (1,000 mg total) by mouth every 6 (six) hours as needed for mild pain, fever or headache (or Fever >/= 101).   buprenorphine-naloxone 8-2 mg Subl SL tablet Commonly known as: SUBOXONE Place 1 tablet under the tongue 2 (two) times daily for 5 days.   docusate sodium 100 MG capsule Commonly known as: COLACE Take 1 capsule (100 mg total) by mouth 2 (two) times daily.   feeding supplement Liqd Take 237 mLs by mouth 3 (three) times daily between meals.   ferrous sulfate 325 (65 FE) MG tablet Take 1 tablet (325 mg total) by mouth 3 (three) times daily with meals.   methocarbamol 500 MG tablet Commonly known as: ROBAXIN Take 1 tablet (500 mg total) by mouth every 8 (eight) hours as needed for muscle spasms.   multivitamin with minerals Tabs tablet Take 1 tablet by mouth daily. Start taking on: April 23, 2020   polyethylene glycol 17 g packet Commonly known as: MIRALAX / GLYCOLAX Take 17 g by mouth daily as needed for moderate constipation.               Durable Medical Equipment  (  From admission, onward)           Start     Ordered   03/28/20 1458  For home use only DME Walker rolling  Once       Question Answer Comment  Walker: With 5 Inch Wheels   Patient needs a walker to treat with the following condition Physical deconditioning   Patient needs a walker to treat with the following condition Gait instability      03/28/20 1457           Allergies  Allergen Reactions   Tramadol Other (See Comments)    Upset stomach    Follow-up Information     [facility]. Schedule an appointment as soon as possible for a visit.   Contact information: [address] [place]  [address] [phone]        [facility]. Call on 04/24/2020.   Why: @ 9:45 am at the IM Suboxone Clinic: [phone]-with Dr Nedra Hai has agreed to see  patient outpatient- appointment has been scheduled.  Contact information: Located in: [facility] Address: Ground Floor - [facility], [address] [place], [place], [place] [address] Phone: [phone]                 The results of significant diagnostics from this hospitalization (including imaging, microbiology, ancillary and laboratory) are listed below for reference.    Significant Diagnostic Studies: DG CHEST PORT 1 VIEW  Result Date: 04/08/2020 CLINICAL DATA:  Check PICC line placement EXAM: PORTABLE CHEST 1 VIEW COMPARISON:  03/21/2020 FINDINGS: Cardiac shadow is within normal limits. Right-sided PICC line is noted with the tip in the distal superior vena cava. Improved aeration is noted in the lungs bilaterally. Small left-sided pleural effusion is seen smaller right-sided effusion is noted. No bony abnormality is seen. IMPRESSION: Improved aeration when compared with the prior exam. PICC line is noted in satisfactory position. Electronically Signed   By: Alcide Clever M.D.   On: 04/08/2020 16:42    Microbiology: No results found for this or any previous visit (from the past 240 hour(s)).   Labs: Basic Metabolic Panel: Recent Labs  Lab 04/16/20 1225 04/21/20 0556  NA 135 134*  K 4.4 3.9  CL 96* 98  CO2 31 30  GLUCOSE 126* 128*  BUN 25* 27*  CREATININE 0.76 0.78  CALCIUM 9.7 9.8   Liver Function Tests: Recent Labs  Lab 04/21/20 0556  AST 122*  ALT 99*  ALKPHOS 97  BILITOT 0.9  PROT 8.2*  ALBUMIN 1.9*   No results for input(s): LIPASE, AMYLASE in the last 168 hours. No results for input(s): AMMONIA in the last 168 hours. CBC: Recent Labs  Lab 04/21/20 0556  WBC 5.4  HGB 10.4*  HCT 34.7*  MCV 89.4  PLT 76*   Cardiac Enzymes: No  results for input(s): CKTOTAL, CKMB, CKMBINDEX, TROPONINI in the last 168 hours. BNP: BNP (last 3 results) No results for input(s): BNP in the last 8760 hours.  ProBNP (last 3 results) No results for input(s): PROBNP in the last 8760 hours.  CBG: No results for input(s): GLUCAP in the last 168 hours.     Signed:  Junious Silk ANP [facility] 04/22/2020, 11:25 AM 04/22/2020: Pt seen today and yesterday and he is clinically wnl and denies any complaints today exam is wnl.  Pt is d/c with abx per ID recommendation ,all concerns answered.

## 2020-04-22 NOTE — Progress Notes (Addendum)
Occupational Therapy Treatment Note  Pt issued written MedBridge HEP with focus on capular stretching and scapular strengthening. Recommend follow up with outpt OT to further address L shoulder dysfunction. CM notified.   Medbridge Access Code: [email or web address]  URL: [email or web address]    04/22/20 1600  OT Visit Information  Last OT Received On 04/22/20  Assistance Needed +1  History of Present Illness Pt is a 44 y.o. male admitted 03/11/20 for evaluation of abnormal blood cultures. Chest CTA shows multiple cavitary and noncavitary nodules consistent with septic emboli; bilateral PNA, bilateral hilar adenopathy. 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 Pt with L shoulder septic arthritis s/p I&D 9/10. S/p catheter-based debridement of tricuspid valve 9/16. PMH includes IVDA, bipolar disorder.  Underwent AngioVac procedure 9/16.  Precautions  Precautions Fall  Pain Assessment  Pain Assessment Faces  Faces Pain Scale 4  Pain Location L shoulder with exercise/stretching  Pain Descriptors / Indicators Grimacing;Guarding;Moaning  Pain Intervention(s) Limited activity within patient's tolerance  Cognition  Arousal/Alertness Awake/alert  Behavior During Therapy WFL for tasks assessed/performed  Overall Cognitive Status Within Functional Limits for tasks assessed  General Comments most likley at baseline  General Exercises - Upper Extremity  Shoulder Flexion AROM;[name];Left;10 reps;Supine (stretching)  Shoulder ABduction [name];AROM;Left;10 reps;Seated  Other Exercises  Other Exercises posterior capsule stretch  Other Exercises Sleeper stretch for IR  Other Exercises  table stretch for FF; Abd; scaption  Other Exercises Stretch in supine for ER  Other Exercises scapular retraction (seated)and protraction (supine) Will need to focus on scapular strengthening; capsular stretching and then begin shoulder strengthening; pt compensating by abnormal shoulder  movements and hiking shoulder  OT - End of Session  Activity Tolerance Patient tolerated treatment well  Patient left in bed;with call bell/phone within reach  Nurse Communication Other (comment) (need for outpt follow up OT)  OT Assessment/Plan  OT Plan Discharge plan remains appropriate  Pain - Right/Left Left  Pain - part of body Shoulder  OT Frequency (ACUTE ONLY) Min 2X/week  Follow Up Recommendations Outpatient OT;Supervision - Intermittent  OT Equipment None recommended by OT  AM-PAC OT "6 Clicks" Daily Activity Outcome Measure (Version 2)  Help from another person eating meals? 4  Help from another person taking care of personal grooming? 4  Help from another person toileting, which includes using toliet, bedpan, or urinal? 4  Help from another person bathing (including washing, rinsing, drying)? 4  Help from another person to put on and taking off regular upper body clothing? 4  Help from another person to put on and taking off regular lower body clothing? 4  6 Click Score 24  OT Goal Progression  Progress towards OT goals Progressing toward goals (working on L UE goal)  Acute Rehab OT Goals  Patient Stated Goal return home  OT Goal Formulation With patient  Time For Goal Achievement 05/01/20  Potential to Achieve Goals Good  ADL Goals  Pt/caregiver will Perform Home Exercise Program Increased ROM;Increased strength;Left upper extremity;With written HEP provided  OT Time Calculation  OT Start Time (ACUTE ONLY) 1440  OT Stop Time (ACUTE ONLY) 1452  OT Time Calculation (min) 12 min  OT General Charges  $OT Visit 1 Visit  OT Treatments  $Therapeutic Exercise 8-22 mins

## 2020-04-22 NOTE — Progress Notes (Signed)
[facility]  Date of Admission:  03/11/2020     Total days of antibiotics 42         ASSESSMENT:  [name] has completed close to 6 weeks of antibiotics in treatment for MRSA/Serratia bacteremia. Has remained afebrile and continues to improve. Appears to be ready for discharge from ID standpoint with recommendation for no additional antibiotic therapy. Will arrange for follow up in ID clinic to include treatment for Hepatitis C. His plan is to continue Suboxone at discharge for his opioid use disorder.  PLAN:  1. Ok for discharge from ID perspective.  2. No further antibiotics needed at this point. 3. Will arrange follow up with ID for Hepatitis C treatment and hospital follow up. (05/07/20 at 10:30am) 4. Remove PICC prior to discharge.   Principal Problem:   MRSA bacteremia Active Problems:   Polysubstance dependence (HCC)   Hypokalemia   Thrombocytopenia (HCC)   Serratia septicemia (HCC)   Bipolar 1 disorder (HCC)   Heroin abuse (HCC)   Arthritis, septic, shoulder (HCC)   Endocarditis of tricuspid valve   Hepatitis C virus infection   IVDU (intravenous drug user)   Normocytic anemia   Cigarette smoker   Septic pulmonary embolism (HCC)   Pressure injury of skin   Lumbar back pain   . buprenorphine-naloxone  1 tablet Sublingual BID  . Chlorhexidine Gluconate Cloth  6 each Topical Daily  . docusate sodium  100 mg Oral BID  . enoxaparin (LOVENOX) injection  40 mg Subcutaneous Q24H  . feeding supplement  237 mL Oral TID BM  . ferrous sulfate  325 mg Oral TID WC  . multivitamin with minerals  1 tablet Oral Daily  . sodium chloride flush  10-40 mL Intracatheter Q12H    SUBJECTIVE:  Afebrile overnight with no acute events.  Doing okay today.  Ready to go home when able  Allergies  Allergen Reactions  . Tramadol Other (See Comments)    Upset stomach     Review of Systems: Review of Systems  Constitutional: Negative for chills, fever  and weight loss.  Respiratory: Negative for cough, shortness of breath and wheezing.   Cardiovascular: Negative for chest pain and leg swelling.  Gastrointestinal: Negative for abdominal pain, constipation, diarrhea, nausea and vomiting.  Skin: Negative for rash.      OBJECTIVE: Vitals:   04/21/20 1932 04/22/20 0428 04/22/20 0431 04/22/20 0844  BP: 115/77  99/75 104/72  Pulse: 82  82   Resp: 16  18   Temp: 98.7 F (37.1 C)  98.7 F (37.1 C)   TempSrc: Oral  Oral   SpO2: 98%  100% 98%  Weight:  57.5 kg    Height:       Body mass index is 17.69 kg/m.  Physical Exam Constitutional:      General: He is not in acute distress.    Appearance: He is well-developed.  Cardiovascular:     Rate and Rhythm: Normal rate and regular rhythm.     Heart sounds: Normal heart sounds.  Pulmonary:     Effort: Pulmonary effort is normal.     Breath sounds: Normal breath sounds.  Skin:    General: Skin is warm and dry.  Neurological:     Mental Status: He is alert and oriented to person, place, and time.  Psychiatric:        Behavior: Behavior normal.        Thought Content: Thought content normal.  Judgment: Judgment normal.     Lab Results Lab Results  Component Value Date   WBC 5.4 04/21/2020   HGB 10.4 (L) 04/21/2020   HCT 34.7 (L) 04/21/2020   MCV 89.4 04/21/2020   PLT 76 (L) 04/21/2020    Lab Results  Component Value Date   CREATININE 0.78 04/21/2020   BUN 27 (H) 04/21/2020   NA 134 (L) 04/21/2020   K 3.9 04/21/2020   CL 98 04/21/2020   CO2 30 04/21/2020    Lab Results  Component Value Date   ALT 99 (H) 04/21/2020   AST 122 (H) 04/21/2020   ALKPHOS 97 04/21/2020   BILITOT 0.9 04/21/2020     Microbiology: No results found for this or any previous visit (from the past 240 hour(s)).   Robert Eke, NP [facility]  04/22/2020  12:33 PM

## 2020-04-22 NOTE — Progress Notes (Signed)
Per MD order, PICC line removed. Cath intact at 42cm. Vaseline pressure gauze to site, pressure held x 5min. No bleeding to site. Pt instructed to keep dressing CDI x 24 hours. Avoid heavy lifting, pushing or pulling x 24 hours,  If bleeding occurs hold pressure, if bleeding does not stop contact MD or go to the ED. Pt does not have any questions. Robert Kramer M 

## 2020-04-22 NOTE — Discharge Instructions (Signed)
Bacteremia, Adult Bacteremia is the presence of bacteria in the blood. When bacteria enter the bloodstream, they can cause a life-threatening reaction called sepsis. Sepsis is a medical emergency. What are the causes? This condition is caused by bacteria that get into the blood. Bacteria can enter the blood from an infection, including:  A skin infection or injury, such as a burn or a cut.  A lung infection (pneumonia).  An infection in the stomach or intestines.  An infection in the bladder or urinary system (urinary tract infection).  A bacterial infection in another part of the body that spreads to the blood. Bacteria can also enter the blood during a dental or medical procedure, from bleeding gums, or through use of an unclean needle. What increases the risk? This condition is more likely to develop in children, older adults, and people who have:  A long-term (chronic) disease or condition like diabetes or chronic kidney failure.  An artificial joint or heart valve, or heart valve disease.  A tube inserted to treat a medical condition, such as a urinary catheter or IV.  A weak disease-fighting system (immune system).  Injected illegal drugs.  Been hospitalized for more than 10 days in a row. What are the signs or symptoms? Symptoms of this condition include:  Fever and chills.  Fast heartbeat and shortness of breath.  Dizziness, weakness, and low blood pressure.  Confusion or anxiety.  Pain in the abdomen, nausea, vomiting, and diarrhea. Bacteremia that has spread to other parts of the body may cause symptoms in those areas. In some cases, there are no symptoms. How is this diagnosed? This condition may be diagnosed with a physical exam and tests, such as:  Blood tests to check for bacteria (cultures) or other signs of infection.  Tests of any tubes that you have had inserted. These tests check for a source of infection.  Urine tests to check for bacteria in the  urine.  Imaging tests, such as an X-ray, a CT scan, an MRI, or a heart ultrasound. These check for a source of infection in other parts of your body, such as your lungs, heart valves, or joints. How is this treated? This condition is usually treated in the hospital. If you are treated at home, you may need to return to the hospital for medicines, blood tests, and evaluation. Treatment may include:  Antibiotic medicines. These may be given by mouth or directly into your blood through an IV. You may need antibiotics for several weeks. At first, you may be given an antibiotic to kill most types of blood bacteria. If tests show that a certain kind of bacteria is causing the problem, you may be given a different antibiotic.  IV fluids.  Removing any catheter or device that could be a source of infection.  Blood pressure and breathing support, if needed.  Surgery to control the source or the spread of infection, such as surgery to remove an implanted device, abscess, or infected tissue. Follow these instructions at home: Medicines  Take over-the-counter and prescription medicines only as told by your health care provider.  If you were prescribed an antibiotic medicine, take it as told by your health care provider. Do not stop taking the antibiotic even if you start to feel better. General instructions   Rest as needed. Ask your health care provider when you may return to normal activities.  Drink enough fluid to keep your urine pale yellow.  Do not use any products that contain nicotine or   tobacco, such as cigarettes, e-cigarettes, and chewing tobacco. If you need help quitting, ask your health care provider.  Keep all follow-up visits as told by your health care provider. This is important. How is this prevented?   Wash your hands regularly with soap and water. If soap and water are not available, use hand sanitizer.  You should wash your hands: ? After using the toilet or changing a  diaper. ? Before preparing, cooking, serving, or eating food. ? While caring for a sick person or while visiting someone in a hospital. ? Before and after changing bandages (dressings) over wounds.  Clean any scrapes or cuts with soap and water and cover them with a clean bandage.  Get vaccinations as recommended by your health care provider.  Practice good oral hygiene. Brush your teeth two times a day, and floss regularly.  Take good care of your skin. This includes bathing and moisturizing on a regular basis. Contact a health care provider if:  Your symptoms get worse, and medicines do not help.  You have severe pain. Get help right away if you have:  Pain.  A fever or chills.  Trouble breathing.  A fast heart rate.  Skin that is blotchy, pale, or clammy.  Confusion.  Weakness.  Lack of energy or unusual sleepiness.  New symptoms that develop after treatment has started. These symptoms may represent a serious problem that is an emergency. Do not wait to see if the symptoms will go away. Get medical help right away. Call your local emergency services (911 in the [place] Do not drive yourself to the hospital. Summary  Bacteremia is the presence of bacteria in the blood. When bacteria enter the bloodstream, they can cause a life-threatening reaction called sepsis.  Bacteremia is usually treated with antibiotic medicines in the hospital.  If you were prescribed an antibiotic medicine, take it as told by your health care provider. Do not stop taking the antibiotic even if you start to feel better.  Get help right away if you have any new symptoms that develop after treatment has started. This information is not intended to replace advice given to you by your health care provider. Make sure you discuss any questions you have with your health care provider. Document Revised: 11/11/2018 Document Reviewed: 11/11/2018 Elsevier Patient Education  2020 [employer].   Illegal Drug Use Information, Adult Illegal drugs are chemicals and substances that are illegal to use, sell, or have (possess). Health care providers and pharmacies do not use or carry these types of drugs to treat medical problems because they can cause serious side effects and can lead to death. Examples of illegal drugs include:  Cocaine or crack.  Meth (methamphetamine) or crystal meth.  "Bath salts" (synthetic cathinones).  Heroin.  LSD, PCP, or acid.  Ecstasy. What is drug dependence? Using illegal drugs often leads to dependence or addiction. When you use certain drugs over a long period of time, your brain chemistry changes so that you can no longer function normally without that drug. This is called drug dependence. Drug dependence can cause you to:  Have unpleasant feelings and physical problems when you stop using the drug (withdrawal).  Be unable to perform at work or at home, or do the activities you used to do, without using the drug. Some drugs make people feel so good that they want to use the drug again and again. This is called drug addiction. People who are addicted spend a lot of time seeking  out the drug so that they can get the feeling they want from it. Addiction and dependence can be very hard to overcome. How can illegal drug use and dependence affect me? Using an illegal drug only once can have a major impact on your life. It is possible to die from side effects after using a drug just once. If you use an illegal drug repeatedly, you may need to take larger and larger doses of the drug to experience the feelings you want. Drug dependency and addiction may lead to:  Being unable to care for yourself and others.  Withdrawal, if you stop using the drug.  Negative effects on your relationships and work performance. It causes others not to trust you. If you have children, you could lose custody of them.  Behaving in ways that do not match your  values.  Lying and crime, such as stealing.  Jail or prison. This can affect your ability to find a good job or continue your education.  Health problems such as tooth loss, skin problems, heart and lung disease, and stomach problems.  If you are a woman, you may have problems with pregnancy, including: ? Losing the pregnancy early (miscarriage), early delivery (premature birth), or delivering a lifeless infant (stillbirth). ? Slow or abnormal growth (birth defects) of your unborn child. ? Giving birth to a newborn who is addicted to illegal drugs.  A drug overdose. This is a dangerous situation that requires hospitalization and often leads to death. What are the benefits of avoiding illegal drug use? Avoiding illegal drug use can:  Keep your mind and body healthy. This can help improve work performance and participation in activities.  Keep you from developing drug dependency or addiction. This can help you avoid negative side effects such as withdrawal and overdose.  Help you have healthy relationships with your friends and family.  Help you have more stable finances. Instead of using your money for drugs, you can: ? Spend it on things you would like to have. ? Save it for future use, such as for retirement or for big purchases like a car or a house.  Help you avoid a permanent criminal record, jail time, or prison time. Having a clean record allows you to have more job and educational opportunities in the future. What actions can be taken? To avoid using illegal drugs:  Find healthy ways to cope with stress, such as exercise, meditation, or spending time with family and friends. Talk with your health care provider about how you feel and how to cope with stress.  Spend time with people who do not use illegal drugs, or make new friends who do not use drugs.  Do something else instead of using drugs. You can exercise, take up a hobby, or participate in activities that you can do  with others.  Do not be afraid to say no if someone offers you an illegal drug. Speak up about why you do not want to use drugs. You can be a positive role model for others.  Work with a health care provider or counselor to create a program for yourself to help you deal with various aspects of your addiction. Where to find more information You can find more information about illegal drug use, dependence, and addiction from:  Your health care provider or mental health counselor.  Narcotics Anonymous: www.na.org  Substance Abuse and Mental Health Services Administration: ? Treatment finder: IdentityList.se ? National helpline: 1-800-662-HELP 581-569-1412) Contact a health care provider if:  You use illegal drugs.  You have missed family activities or work to use illegal drugs.  You have lied or stolen to get illegal drugs.  You lose interest in things you used to enjoy, like hobbies or family activities.  Your eating or sleeping habits change as a result of drug use.  You use medicine to get the same effects as a drug. This is illegal use and can become addictive as well.  You stopped illegal drug use previously and you start actively using it again (relapse).  You want help to change your addictive behavior. Get help right away if:  You have thoughts about hurting yourself or others. If you ever feel like you may hurt yourself or others, or have thoughts about taking your own life, get help right away. You can go to your nearest emergency department or call:  Your local emergency services (911 in the [place]  A suicide crisis helpline, such as the National Suicide Prevention Lifeline at 440 618 8951. This is open 24 hours a day. Summary  Illegal drugs are chemicals and substances that are illegal to use, sell, or possess.  Using illegal drugs often leads to dependence or addiction. This means that you need the drug to feel normal.  If you use illegal drugs,  look for resources to help you quit and find healthy ways to cope with stress. This information is not intended to replace advice given to you by your health care provider. Make sure you discuss any questions you have with your health care provider. Document Revised: 03/15/2019 Document Reviewed: 12/16/2016 Elsevier Patient Education  2020 Elsevier Inc.   Septic Arthritis Septic arthritis is inflammation of a joint that results from an infection. The infection occurs when bacteria or other germs get inside a joint. The knee and hip joints are most often affected, but other joints may also become infected. Usually, just one joint is affected. Joint infections need to be treated quickly to prevent damage to the joint, and to prevent the infection from spreading to other areas of your body. What are the causes? This condition is most often caused by Staphylococcus bacteria. Other causes may include:  Fungal infections.  Sexually transmitted infections (STIs).  Tuberculosis. Bacteria or other germs can spread to the joint. These bacteria are usually from:  Blood carrying germs from an infection in another part of your body to your joint. This is the most common cause of septic arthritis.  An open wound near the joint.  A needle put into the joint.  Joint surgery.  An infection in the bone (osteomyelitis) that spreads to the joint. What increases the risk? You may have a higher risk for this condition if you:  Have an artificial joint.  Have a blood or skin infection.  Have open sores or wounds on your skin.  Had a recent joint surgery or procedure.  Had a recent joint injury.  Have a long-term (chronic) disease, such as: ? Diabetes. ? Osteoarthritis. ? Rheumatoid arthritis. ? HIV (human immunodeficiency virus).  Have a condition or take medicines that weaken your body's defense system (immune system).  Use IV medicines.  Have gonorrhea.  Have a central line for IV  access. What are the signs or symptoms? Symptoms of this condition include:  Swelling at the joint.  Severe pain in the joint.  Redness and warmth in the joint.  Being unable to move the joint.  Fever and chills. How is this diagnosed? This condition may be diagnosed based on:  Your symptoms.  Your medical history.  A physical exam.  Other tests to confirm the diagnosis. These may include: ? Removing fluid from your joint to look for signs of infection (synovial fluid analysis). ? Blood tests.  Imaging studies. These may include: ? X-rays. ? MRI. ? CT scan. ? Ultrasound. How is this treated? This condition may be treated by:  Draining fluid from your joint. This may be done for several days in order to relieve pain.  Taking antibiotic medicine. This may be given by IV or by mouth. It may be done in a hospital at first. You may have to continue antibiotics at home by IV or by mouth for several weeks after that.  Surgery to remove: ? Infected fluid and tissue from the joint. ? An infected artificial joint. After the infection has started to heal, you may need physical therapy to regain strength and motion of the joint. Follow these instructions at home: Medicines   Take over-the-counter and prescription medicines only as told by your health care provider.  If you were prescribed an antibiotic medicine, take it as told by your health care provider. Do not stop taking the antibiotic even if you start to feel better.  Follow instructions from your health care provider about how to take antibiotics at home by IV. You may need to have a nurse come to your home to give you antibiotics through IV. Managing pain, stiffness, and swelling   If directed, put ice on the affected area: ? Put ice in a plastic bag. ? Place a towel between your skin and the bag. ? Leave the ice on for 20 minutes, 2-3 times a day.  Raise (elevate) the affected area above the level of your  heart while you are sitting or lying down. Activity  Return to your normal activities as told by your health care provider. Ask your health care provider what activities are safe for you.  Do any exercises or stretches as told by your health care provider or physical therapist. General instructions  Do not use any products that contain nicotine or tobacco, such as cigarettes and e-cigarettes. These can delay healing. If you need help quitting, ask your health care provider.  Wash your hands often with soap and water. If soap and water are not available, use hand sanitizer.  Keep all follow-up visits as told by your health care provider. This is important. Contact a health care provider if you:  Have pain that is not controlled with medicine.  Develop a fever or chills.  Have redness, warmth, pain, or swelling that returns after treatment. Get help right away if you:  Have signs of worsening infection in your joint. Watch for: ? Very severe pain. ? Redness. ? Warmth. ? Swelling.  Have rapid breathing or you have trouble breathing.  Have chest pain.  Cannot drink fluids or make urine.  Notice that the affected area changed color or turned blue.  Have numbness or severe pain in the affected area. Summary  Septic arthritis is inflammation of a joint that occurs when bacteria or other germs get inside a joint and cause an infection.  Joint infections need to be treated quickly to prevent damage to the joint, and to prevent the infection from spreading to other areas of your body.  Symptoms of joint infection include redness, warmth, swelling, pain, and being unable to move a joint.  Treatment usually involves draining fluid from the joint and taking antibiotic medicine. This information is not  intended to replace advice given to you by your health care provider. Make sure you discuss any questions you have with your health care provider. Document Revised: 10/14/2018 Document  Reviewed: 08/03/2017 Elsevier Patient Education  2020 [employer]

## 2020-04-24 ENCOUNTER — Other Ambulatory Visit: Payer: Self-pay

## 2020-04-24 ENCOUNTER — Other Ambulatory Visit: Payer: Self-pay | Admitting: Student in an Organized Health Care Education/Training Program

## 2020-04-24 ENCOUNTER — Ambulatory Visit (INDEPENDENT_AMBULATORY_CARE_PROVIDER_SITE_OTHER): Payer: Self-pay | Admitting: Internal Medicine

## 2020-04-24 ENCOUNTER — Encounter: Payer: Self-pay | Admitting: Internal Medicine

## 2020-04-24 VITALS — BP 115/73 | HR 94 | Temp 97.8°F | Ht 71.0 in | Wt 133.6 lb

## 2020-04-24 DIAGNOSIS — L89302 Pressure ulcer of unspecified buttock, stage 2: Secondary | ICD-10-CM

## 2020-04-24 DIAGNOSIS — F1120 Opioid dependence, uncomplicated: Secondary | ICD-10-CM

## 2020-04-24 DIAGNOSIS — M00012 Staphylococcal arthritis, left shoulder: Secondary | ICD-10-CM

## 2020-04-24 DIAGNOSIS — B182 Chronic viral hepatitis C: Secondary | ICD-10-CM

## 2020-04-24 DIAGNOSIS — I079 Rheumatic tricuspid valve disease, unspecified: Secondary | ICD-10-CM

## 2020-04-24 DIAGNOSIS — F1920 Other psychoactive substance dependence, uncomplicated: Secondary | ICD-10-CM

## 2020-04-24 MED ORDER — BUPRENORPHINE HCL-NALOXONE HCL 8-2 MG SL SUBL: 1.0000 | SUBLINGUAL_TABLET | Freq: Two times a day (BID) | SUBLINGUAL | 0 refills | Status: DC

## 2020-04-24 MED FILL — BUPRENORPHIN-NALOXON 8-2 MG: 8-2 | 14 days supply | Qty: 28 | Fill #0

## 2020-04-24 NOTE — Assessment & Plan Note (Signed)
Found to be hep C antibody positive confirmed with quantitative viral RNA. Has upcoming appointment with ID to start treatment.

## 2020-04-24 NOTE — Progress Notes (Signed)
CC: Opioid use disorder  HPI: RobertRobert Kramer is a 44 y.o. with PMH listed below presenting with complaint of opioid use disorder. Please see problem based assessment and plan for further details.  Past Medical History:  Diagnosis Date  . Anxiety   . Bipolar 1 disorder (HCC)   . Bipolar 1 disorder (HCC)   . DDD (degenerative disc disease), lumbar   . Depression   . Drug-seeking behavior   . Hallux valgus with bunions   . Mood swings   . Polysubstance abuse (HCC)    Family History Unable to provide  Social History Currently unemployed. Lives with sister in [place]. Smokes 1/4 pack daily for 20 years. Mentions occasional alcohol use. Prior hx of IV drug use with heroin but has not used since admission for sepsis on 03/2020. Currently on suboxone.  Review of Systems: Review of Systems  Constitutional: Negative for chills, fever and malaise/fatigue.  Eyes: Negative for blurred vision and double vision.  Respiratory: Negative for cough and shortness of breath.   Cardiovascular: Negative for chest pain and palpitations.  Gastrointestinal: Negative for constipation, diarrhea, nausea and vomiting.  Neurological: Negative for tremors and weakness.  All other systems reviewed and are negative.    Physical Exam: Vitals:   04/24/20 0949  BP: 115/73  Pulse: 94  Temp: 97.8 F (36.6 C)  TempSrc: Oral  SpO2: 97%  Weight: 133 lb 9.6 oz (60.6 kg)  Height: 5\' 11"  (1.803 m)   Gen: Well-developed, thin-appearing, NAD HEENT: NCAT head, hearing intact CV: RRR, S1, S2 normal, no murmurs, no gallops Pulm: CTAB, No rales, no wheezes Extm: ROM intact, Peripheral pulses intact, No peripheral edema Skin: Dry, Warm, stage 1 pressure ulcer over sacrum, barely visible surgical scars on L shoulder  Assessment & Plan:   Opioid use disorder, severe, dependence (HCC) RobertKramer is a 44 yo M w/ PMH of IVDU and Hep C with recent hospitalization for MRSA bacteremia presenting to [facility] for establish  care with the OUD clinic. He mentions that he completed 6 weeks of antibiotic therapy while hospitalized and now feels well. Also he was initiated on suboxone during hospitalization and mentions that he has had no withdrawal symptoms since discharge. He was provided 5 days worth of suboxone and has 2 days of meds left. He mentions significant financial difficulties as well as transportation as he is currently unemployed and lives in [place]. Discussed alternatives such as methadone clinic near [facility] but patient mentions he would like to continue with suboxone clinic at [facility] while looking for financial aid.  [facility] presents for follow up of opioid use disorder I have reviewed the prior induction visit, follow up visits, and telephone encounters relevant to opiate use disorder (OUD) treatment.  Current daily dose: suboxone 8-2mg  BID  Date of Induction: 03/11/20  Current follow up interval, in weeks: 2  OUD contract for suboxone signed this visit  UDS results collected this visit   Arthritis, septic, shoulder (HCC) Stable. Received washout during admission. Mentions range of motion improving after doing physical exercises he was thought during his hospitalization. Denies any tenderness, weakness, tingling, or numbness.  Pressure injury of skin Noted stage 1 pressure on buttocks on exam. Mentions he has been watching it closely and avoiding pressure by sleeping on his side. Discussed OTC skin protectants and continuing to avoid weight bearing on the affected area as much as possible.  Hepatitis C virus infection Found to be hep C antibody positive confirmed with quantitative viral  RNA. Has upcoming appointment with ID to start treatment.  Endocarditis of tricuspid valve Found to have tricuspid valve vegetation on recent admission with successful debridement via catheter. Mentions no further chest pain, dyspnea, palpitations or edema since discharge.    Patient seen with Dr. Oswaldo Done  -Judeth Cornfield, PGY3 [facility] Internal Medicine Pager: [phone]

## 2020-04-24 NOTE — Assessment & Plan Note (Signed)
Noted stage 1 pressure on buttocks on exam. Mentions he has been watching it closely and avoiding pressure by sleeping on his side. Discussed OTC skin protectants and continuing to avoid weight bearing on the affected area as much as possible.

## 2020-04-24 NOTE — Assessment & Plan Note (Signed)
Mr.Shepheard is a 44 yo M w/ PMH of IVDU and Hep C with recent hospitalization for MRSA bacteremia presenting to [facility] for establish care with the OUD clinic. He mentions that he completed 6 weeks of antibiotic therapy while hospitalized and now feels well. Also he was initiated on suboxone during hospitalization and mentions that he has had no withdrawal symptoms since discharge. He was provided 5 days worth of suboxone and has 2 days of meds left. He mentions significant financial difficulties as well as transportation as he is currently unemployed and lives in [place]. Discussed alternatives such as methadone clinic near [facility] but patient mentions he would like to continue with suboxone clinic at [facility] while looking for financial aid.  [name] presents for follow up of opioid use disorder I have reviewed the prior induction visit, follow up visits, and telephone encounters relevant to opiate use disorder (OUD) treatment.  Current daily dose: suboxone 8-2mg  BID  Date of Induction: 03/11/20  Current follow up interval, in weeks: 2  OUD contract for suboxone signed this visit  UDS results collected this visit

## 2020-04-24 NOTE — Assessment & Plan Note (Signed)
Found to have tricuspid valve vegetation on recent admission with successful debridement via catheter. Mentions no further chest pain, dyspnea, palpitations or edema since discharge.

## 2020-04-24 NOTE — Patient Instructions (Addendum)
Dear Robert Kramer,  Thank you for allowing [place] to provide your care today. Today we discussed your suboxone    I have ordered urine drug screen for you. I will call if any are abnormal.    Today we made the following changes to your medications:    Please pick up your suboxone from the [place] outpatient pharmacy on [address]  Please follow-up in 2 weeks.    Should you have any questions or concerns please call the internal medicine clinic at [phone].    Thank you for choosing [facility].  Buprenorphine; Naloxone oral dissolving film What is this medicine? BUPRENORPHINE; NALOXONE (byoo pre NOR feen; nal OX one) is used to treat certain types of drug dependence. This medicine may be used for other purposes; ask your health care provider or pharmacist if you have questions. COMMON BRAND NAME(S): BUNAVAIL, Suboxone What should I tell my health care provider before I take this medicine? They need to know if you have any of these conditions:  brain tumor  drink more than 3 alcohol-containing drinks per day  head injury  heart disease  kidney disease  liver disease  lung or breathing disease, like asthma  an unusual or allergic reaction to buprenorphine, naloxone, other medicines, foods, dyes, or preservatives  pregnant or trying to get pregnant  breast-feeding How should I use this medicine? For sublingual use (Suboxone): Drink water to moisten the mouth. Then, place the medicine under the tongue and let it dissolve. Follow the directions on the prescription label. Leave this medicine in the sealed foil pack until you are ready to use it. If your dose requires you to take more than 1 film, place the second film under the tongue on other side of the mouth. If your dose requires you to take more than 2 films, place the third film under your tongue on either side after the first 2 films have dissolved. Do not let the films touch in your mouth. After you put this  medicine in your mouth, do not move it. Do not swallow, cut, or chew the film. Take your medicine at regular intervals. Do not take your medicine more often than directed. For buccal use (Suboxone or Bunavail): Drink water to moisten the inside of the cheek. Then, place the medicine against the inside of the moistened cheek and let it dissolve. Follow the directions on the prescription label. Leave this medicine in the sealed foil pack until you are ready to use it. If your dose requires you to take more than 1 film, place the second film on the inside of the other cheek. If your dose requires you to take more than 2 films, place the third film on the inside of your right or left cheek after the first 2 films have dissolved. After you put this medicine in your mouth, do not move it. Do not swallow, cut, or chew the film. Take your medicine at regular intervals. Do not take your medicine more often than directed. A special MedGuide will be given to you by the pharmacist with each prescription and refill. Be sure to read this information carefully each time. Talk to your pediatrician regarding the use of this medicine in children. Special care may be needed. Overdosage: If you think you have taken too much of this medicine contact a poison control center or emergency room at once. NOTE: This medicine is only for you. Do not share this medicine with others. What if I miss a  dose? If you miss a dose, take it as soon as you can. If it is almost time for your next dose, take only that dose. Do not take double or extra doses. What may interact with this medicine? Do not take this medication with any of the following medicines:  cisapride  certain medicines for fungal infections like ketoconazole and itraconazole  dronedarone  pimozide  thioridazine This medicine may interact with the following medications:  alcohol  antihistamines for allergy, cough and cold  antiviral medicines for HIV or  AIDS  atropine  certain antibiotics like clarithromycin, erythromycin, linezold, rifampin  certain medicines for anxiety or sleep  certain medicines for bladder problems like oxybutynin, tolterodine  certain medicines for depression like amitriptyline, fluoxetine, sertraline  certain medicines for migraine headache like almotriptan, eletriptan, frovatriptan, naratriptan, rizatriptan, sumatriptan, zolmitriptan  certain medicines for nausea or vomiting like dolasetron, ondansetron, palonosetron  certain medicines for Parkinson's disease like benztropine, trihexyphenidyl  certain medicines for seizures like phenobarbital, primidone  certain medicines for stomach problems like cimetidine, dicyclomine, hyoscyamine  certain medicines for travel sickness like scopolamine  diuretics  dofetilide  general anesthetics like halothane, isoflurane, methoxyflurane, propofol  ipratropium  local anesthetics like lidocaine, pramoxine, tetracaine  MAOIs like Carbex, Eldepryl, Marplan, Nardil, and Parnate  medicines that relax muscles for surgery  methylene blue  other medicines that prolong the QT interval (cause an abnormal heart rhythm)  other narcotic medicines for pain or cough  phenothiazines like chlorpromazine, mesoridazine, prochlorperazine  ritonavir  ziprasidone This list may not describe all possible interactions. Give your health care provider a list of all the medicines, herbs, non-prescription drugs, or dietary supplements you use. Also tell them if you smoke, drink alcohol, or use illegal drugs. Some items may interact with your medicine. What should I watch for while using this medicine? Visit your health care provider regularly. Attend counseling or support groups that your health care provider recommends. Do not try to overcome the effects of the drug by taking large amounts of narcotics. This can cause severe problems including death. Also, you may be more sensitive  to lower doses of narcotics after you stop taking this drug. Do not suddenly stop taking your drug because you may develop a severe reaction. Your body becomes used to the drug. Your health care provider will tell you how much drug to take. If your health care provider wants you to stop the drug, the dose will be slowly lowered over time to avoid any side effects. If you take other drugs that also cause drowsiness like other narcotic pain drugs, benzodiazepines, or other drugs for sleep, you may have more side effects. Give your health care provider a list of all drugs you use. He or she will tell you how much drug to take. Do not take more drug than directed. Get emergency help right away if you have trouble breathing or are unusually tired or sleepy. Talk to your health care provider about naloxone and how to get it. Naloxone is an emergency drug used for an opioid overdose. An overdose can happen if you take too much opioid. It can also happen if an opioid is taken with some other drugs or substances, like alcohol. Know the symptoms of an overdose, like trouble breathing, unusually tired or sleepy, or not being able to respond or wake up. Make sure to tell caregivers and close contacts where it is stored. Make sure they know how to use it. After naloxone is given, you must get  emergency help right away. Naloxone is a temporary treatment. Repeat doses may be needed. You may get drowsy or dizzy. Do not drive, use machinery, or do anything that needs mental alertness until you know how this drug affects you. Do not stand up or sit up quickly, especially if you are an older patient. This reduces the risk of dizzy or fainting spells. Alcohol may interfere with the effect of this drug. Avoid alcoholic drinks. Wear a medical ID bracelet or chain. Carry a card that describes your condition. List the drugs and doses you take on the card. This drug will cause constipation. If you do not have a bowel movement for 3  days, call your health care provider. Your mouth may get dry. Chewing sugarless gum or sucking hard candy and drinking plenty of water may help. Contact your health care provider if the problem does not go away or is severe. What side effects may I notice from receiving this medicine? Side effects that you should report to your doctor or health care professional as soon as possible:  allergic reactions like skin rash, itching or hives, swelling of the face, lips, or tongue  breathing problems  confusion  signs and symptoms of a dangerous change in heartbeat or heart rhythm like chest pain; dizziness; fast or irregular heartbeat; palpitations; feeling faint or lightheaded, falls; breathing problems  signs and symptoms of liver injury like dark yellow or brown urine; general ill feeling or flu-like symptoms; light-colored stools; loss of appetite; nausea; right upper belly pain; unusually weak or tired; yellow of the eyes or skin  signs and symptoms of low blood pressure like dizziness; feeling faint or lightheaded, falls; unusually weak or tired  trouble passing urine or change in the amount of urine Side effects that usually do not require medical attention (report to your doctor or health care professional if they continue or are bothersome):  constipation  dry mouth  nausea, vomiting  tiredness This list may not describe all possible side effects. Call your doctor for medical advice about side effects. You may report side effects to FDA at 1-800-FDA-1088. Where should I keep my medicine? Keep out of the reach of children. This medicine can be abused. Keep your medicine in a safe place to protect it from theft. Do not share this medicine with anyone. Selling or giving away this medicine is dangerous and against the law. Store at room temperature between 15 and 30 degrees C (56 and 86 degrees F). This medicine may cause harm and death if it is taken by other adults, children, or pets.  Return medicine that has not been used to an official disposal site. Contact the DEA at 289-045-4156 or your city/county government to find a site. If you cannot return the medicine, flush it down the toilet. Do not use the medicine after the expiration date. NOTE: This sheet is a summary. It may not cover all possible information. If you have questions about this medicine, talk to your doctor, pharmacist, or health care provider.  2020 [employer]vier/Gold Standard (2019-01-31 16:22:57)

## 2020-04-24 NOTE — Assessment & Plan Note (Signed)
Stable. Received washout during admission. Mentions range of motion improving after doing physical exercises he was thought during his hospitalization. Denies any tenderness, weakness, tingling, or numbness.

## 2020-04-25 NOTE — Progress Notes (Signed)
Internal Medicine Clinic Attending  I saw and evaluated the patient.  I personally confirmed the key portions of the history and exam documented by Dr. Lee and I reviewed pertinent patient test results.  The assessment, diagnosis, and plan were formulated together and I agree with the documentation in the resident's note.  

## 2020-04-27 LAB — FUNGAL ORGANISM REFLEX

## 2020-04-27 LAB — FUNGUS CULTURE WITH STAIN

## 2020-04-27 LAB — FUNGUS CULTURE RESULT

## 2020-04-30 LAB — TOXASSURE SELECT,+ANTIDEPR,UR

## 2020-05-03 ENCOUNTER — Encounter: Payer: Self-pay | Admitting: Thoracic Surgery (Cardiothoracic Vascular Surgery)

## 2020-05-07 ENCOUNTER — Other Ambulatory Visit: Payer: Self-pay

## 2020-05-07 ENCOUNTER — Other Ambulatory Visit: Payer: Self-pay | Admitting: Internal Medicine

## 2020-05-07 ENCOUNTER — Encounter: Payer: Self-pay | Admitting: Internal Medicine

## 2020-05-07 ENCOUNTER — Inpatient Hospital Stay: Payer: Self-pay | Admitting: Family

## 2020-05-07 ENCOUNTER — Ambulatory Visit (INDEPENDENT_AMBULATORY_CARE_PROVIDER_SITE_OTHER): Payer: Self-pay | Admitting: Internal Medicine

## 2020-05-07 DIAGNOSIS — B182 Chronic viral hepatitis C: Secondary | ICD-10-CM

## 2020-05-07 DIAGNOSIS — I079 Rheumatic tricuspid valve disease, unspecified: Secondary | ICD-10-CM

## 2020-05-07 DIAGNOSIS — M00012 Staphylococcal arthritis, left shoulder: Secondary | ICD-10-CM

## 2020-05-07 DIAGNOSIS — F1120 Opioid dependence, uncomplicated: Secondary | ICD-10-CM

## 2020-05-07 LAB — ACID FAST CULTURE WITH REFLEXED SENSITIVITIES (MYCOBACTERIA): Acid Fast Culture: NEGATIVE

## 2020-05-07 MED ORDER — BUPRENORPHINE HCL-NALOXONE HCL 8-2 MG SL SUBL: 1.0000 | SUBLINGUAL_TABLET | Freq: Two times a day (BID) | SUBLINGUAL | 0 refills | Status: DC

## 2020-05-07 MED FILL — BUPRENORPHIN-NALOXON 8-2 MG: 8-2 | 28 days supply | Qty: 28 | Fill #0

## 2020-05-07 NOTE — Assessment & Plan Note (Signed)
Cardiac exam unremarkable. Patient denies any chest pain, palpitations, SOB, or edema.

## 2020-05-07 NOTE — Assessment & Plan Note (Signed)
Patient states he continues to have intermittent pain in his left shoulder however it has significantly improved since his hospital admission. He reports he had an [facility] [employer] joint separation injury in high school and has always had trouble with that shoulder. He is not interested in PT at this time as he is continuing to do home exercises.

## 2020-05-07 NOTE — Progress Notes (Signed)
   CC: Opioid use disorder  HPI:  [name] is a 44 y.o. with a PMHx of IVDU, hepatitis C, bipolar 1 disorder and depression presenting for opioid use disorder.  For details of today's visit and the status of his chronic medical issues please refer to the assessment and plan.   Past Medical History:  Diagnosis Date  . Anxiety   . Bipolar 1 disorder (HCC)   . Bipolar 1 disorder (HCC)   . DDD (degenerative disc disease), lumbar   . Depression   . Drug-seeking behavior   . Hallux valgus with bunions   . Mood swings   . Polysubstance abuse (HCC)    Review of Systems:   Review of Systems  Constitutional: Negative for chills, fever, malaise/fatigue and weight loss.  Respiratory: Negative for cough, shortness of breath and wheezing.   Cardiovascular: Negative for chest pain, palpitations and leg swelling.  Gastrointestinal: Negative for abdominal pain, constipation, diarrhea, nausea and vomiting.  Genitourinary: Negative for dysuria, frequency and urgency.  Musculoskeletal: Positive for joint pain. Negative for back pain and myalgias.  Skin: Negative for itching and rash.  Neurological: Negative for dizziness, weakness and headaches.  Psychiatric/Behavioral: Negative.     Physical Exam:  Physical Exam Constitutional:      General: He is not in acute distress.    Appearance: Normal appearance. He is not ill-appearing.  Cardiovascular:     Rate and Rhythm: Normal rate and regular rhythm.     Pulses: Normal pulses.     Heart sounds: Normal heart sounds. No murmur heard.  No friction rub. No gallop.   Pulmonary:     Effort: Pulmonary effort is normal. No respiratory distress.     Breath sounds: Normal breath sounds. No wheezing or rhonchi.  Abdominal:     General: Abdomen is flat. Bowel sounds are normal. There is no distension.     Palpations: Abdomen is soft.     Tenderness: There is no abdominal tenderness. There is no guarding.  Musculoskeletal:        General:  No swelling or tenderness.     Comments: Limited ROM of left shoulder  Skin:    General: Skin is warm and dry.  Neurological:     General: No focal deficit present.     Mental Status: He is alert and oriented to person, place, and time. Mental status is at baseline.  Psychiatric:        Mood and Affect: Mood normal.        Behavior: Behavior normal.        Thought Content: Thought content normal.     Vitals:   05/07/20 0906  BP: 104/61  Pulse: 94  Temp: 98.2 F (36.8 C)  TempSrc: Oral  SpO2: 98%  Weight: 140 lb 4.8 oz (63.6 kg)  Height: 5\' 11"  (1.803 m)     Assessment & Plan:   Robert Kramer presents for follow up of opioid use disorder I have reviewed the prior induction visit, follow up visits, and telephone encounters relevant to opiate use disorder (OUD) treatment.  Current daily dose: suboxone 8-2mg  BID  Date of Induction: 03/11/20  Current follow up interval, in weeks: 2  OUD contract for suboxone signed last visit 04/24/20  UDS results collected 04/24/20, will repeat next visit   See Encounters Tab for problem based charting.   Patient discussed with Dr. 04/26/20

## 2020-05-07 NOTE — Patient Instructions (Addendum)
Robert Kramer,  It was a pleasure meeting you today. I am so glad to hear you are doing well and making so much progress since your hospitalization. Today we discussed your suboxone use and refilled your medication. We will plan a follow up in 2 weeks. If you have any questions or concerns in the meantime don't hesitate to call the clinic.   Thanks for allowing [place] to be a part of your care!

## 2020-05-07 NOTE — Assessment & Plan Note (Addendum)
Patient has been doing well on suboxone. States it helps him sleep. He has noticed more vivid dreams on this medication. Sometimes he has stomach pain as well. Denies nausea or vomiting. He has not had any withdrawal symptoms.   Robert Kramer presents for follow up of opioid use disorder I have reviewed the prior induction visit, follow up visits, and telephone encounters relevant to opiate use disorder (OUD) treatment.  Current daily dose: suboxone 8-2mg  BID  Date of Induction: 03/11/20  Current follow up interval, in weeks: 2  OUD contract for suboxone signed 04/24/20  UDS results collected 04/24/20, consistent with suboxone use and +THC

## 2020-05-07 NOTE — Assessment & Plan Note (Signed)
Patient had an appt today with ID to follow up and initiate treatment of Hep C. States he rescheduled this appointment to tomorrow.

## 2020-05-09 ENCOUNTER — Encounter: Payer: Self-pay | Admitting: Physician Assistant

## 2020-05-09 ENCOUNTER — Inpatient Hospital Stay: Payer: Self-pay | Admitting: Physician Assistant

## 2020-05-09 ENCOUNTER — Ambulatory Visit: Payer: Self-pay | Attending: Physician Assistant | Admitting: Physician Assistant

## 2020-05-09 ENCOUNTER — Other Ambulatory Visit: Payer: Self-pay

## 2020-05-09 DIAGNOSIS — D649 Anemia, unspecified: Secondary | ICD-10-CM

## 2020-05-09 DIAGNOSIS — Z09 Encounter for follow-up examination after completed treatment for conditions other than malignant neoplasm: Secondary | ICD-10-CM

## 2020-05-09 DIAGNOSIS — F1120 Opioid dependence, uncomplicated: Secondary | ICD-10-CM

## 2020-05-09 DIAGNOSIS — R7881 Bacteremia: Secondary | ICD-10-CM

## 2020-05-09 DIAGNOSIS — E871 Hypo-osmolality and hyponatremia: Secondary | ICD-10-CM

## 2020-05-09 DIAGNOSIS — B182 Chronic viral hepatitis C: Secondary | ICD-10-CM

## 2020-05-09 NOTE — Progress Notes (Signed)
Virtual Visit via Telephone Note  I connected with Robert Kramer on 05/09/20 at  2:30 PM EDT by telephone and verified that I am speaking with the correct person using two identifiers.  Location: Patient: Robert Kramer Provider: Georgian Co, PA-C   I discussed the limitations, risks, security and privacy concerns of performing an evaluation and management service by telephone and the availability of in person appointments. I also discussed with the patient that there may be a patient responsible charge related to this service. The patient expressed understanding and agreed to proceed.  PATIENT visit by telephone virtually in the context of Covid-19 pandemic. Patient location: home  My Location:  [facility] office Persons on the call:  Me and the patient.  Roomed by Felecia Shelling   History of Present Illness:  Hospitalization 9/6-10/18/2021 for MRSA bacteremia and noted to have Hep C.  Sees ID next week.  Being followed by suboxone clinic for h/o heroin addiction.  He is doing well.  No fever.  Appetite is great.  He also has f/up scheduled with cardiology for valve vegetation.    Recommendations for Outpatient Follow-up:  1. Patient will continue any oral antibiotics as recommended by infectious disease clinic. 2. Patient will establish with the [facility]-he will be given the number to contact to arrange for follow-up appointment 3. Patient was started on Suboxone this admission and has been given a 5-day prescription on date of discharge.  He has completed preclinic evaluation and will follow up with the [facility] internal medicine clinic/Suboxone clinic on 10/20 at 9:45 AM.  He will be seeing Dr. Nedra Hai. 4. Patient was found have iron deficiency anemia secondary to preadmission malnutrition in the context of substance abuse.  He has been started on iron replacement along with Colace and as needed MiraLAX to help with iron related constipation as well as  constipation related to Suboxone. 5. Patient underwent left shoulder arthroscopy with irrigation and debridement on 9/10.  He will need to follow-up with Dr. Dion Saucier for routine postoperative evaluation 6. Patient also had tricuspid valve endocarditis and underwent application of angio VAC on 9/16.  He will need to follow-up with Dr. Cliffton Asters for routine postoperative follow-up 7. Because of incidental finding of hepatitis C recommendation was for patient to follow-up/establish with a gastroenterologist in the outpatient setting.  Recommend that PCP assist in arranging for this outpatient follow-up.   Discharge Diagnoses:  Principal Problem:   MRSA bacteremia Active Problems:   Polysubstance dependence (HCC)   Hypokalemia   Thrombocytopenia (HCC)   Serratia septicemia (HCC)   Bipolar 1 disorder (HCC)   Heroin abuse (HCC)   Arthritis, septic, shoulder (HCC)   Endocarditis of tricuspid valve   Hepatitis C virus infection   IVDU (intravenous drug user)   Normocytic anemia   Cigarette smoker   Septic pulmonary embolism (HCC)   Pressure injury of skin   Lumbar back pain  History of present illness:  44 year old male with history of polysubstance use including IV drug use, bipolar disorder came to ED for evaluation of abnormal blood cultures. Patient was seen in [facility] on 03/06/2020 for evaluation management of heroin withdrawal. He was treated supportively. Blood cultures were obtained and grew MRSA and Serratia marcescens. He was called from home to come to ED for further evaluation. CTA chest PE showed multiple cavitary and noncavitary nodules consistent with septic emboli, bilateral lower lobe pneumonia and bilateral hilar adenopathy felt to be reactive. No pulmonary embolus was  reported. Patient was started on IV ceftriaxone and vancomycin. TTE was performed which showed shaggy vegetations on tricuspid valve. TEE on 03/15/2020 showed 2 x 2 cm long vegetation on tricuspid valve with  resultant severe TR. MRI left shoulder showed septic arthropathy, myositis and tenosynovitis. He underwent washout of left shoulder by orthopedic surgery on 03/15/2020. He was transferred to [facility] on 03/17/2020 for further evaluation of endocarditis by cardiothoracic surgery.  Hospital Course:  Severe sepsis secondary to MRSA and Serratia bacteremia-pulmonary septic emboli, TV endocarditis, left shoulder septic arthritis. Status post left shoulder arthroscopy with debridement irrigation for septic arthritis as per orthopedics on 03/15/2020. Echocardiogram showed tricuspid vegetation. TEE showed 2x2 mobile vegetation with severe TR.  MRI lumbar spine showed no evidence of epidural abscess. Patient refused MRI right shoulder ordered by ID.  CT surgery was consulted - s/p angiovac debridement on 03/21/2020.  Infectious disease following. Continue IV vancomycin; Rocephin was changed to cefepime per ID. Repeat blood cultures drawn on 9/8 negative. ID recommended 4 more weeks of IV antibiotic therapy starting from 9/16/2021with vancomycin and cefepime.  Antibiotics subsequently completed on 04/22/2020 will be this coming Monday.  ID contacted date of discharge no indication to add oral antibiotics to discharge regimen and no indication to follow-up with the clinic at this juncture.  She can follow-up with PCP and can be referred back to the infectious disease clinic if any issue  TV endocarditis-s/p Angiovac  CT surgery following -given office contact information to schedule follow-up appointment after discharge  Insomnia Had successful improvement in insomnia symptoms with use of prn Benadryl  Hyponatremia sodium is stable at 133 as of 9/23  Serum osmolality 281.  Substance use disorder Ongoing IV heroin use, last use was 3 days prior to admission.  Patient started on Suboxone 8/2 mg twice daily with as needed Suboxone per opiate withdrawal protocol.  Spoke with Dr. Erlinda Hong with Suboxone clinic who agreed patient is an appropriate candidate. He requested that patient be given number to contact [facility] arrange for outpatient follow-up; patient has completed this request. OUD intake assessment completed by telephone. Patient has been scheduled for 04/24/2020 at 9:45 with Dr. Nedra Hai.  Prescription for short-term Suboxone call to [facility] pharmacy on 10/14 with plans for family/patient to pick up on date of discharge 10/18 HIV antibodies were nonreactive Hepatitis C antibodies were reactive. Hepatitis C genotype 1b.Patient can follow-up with GI as outpatient for hepatitis C.  Thrombocytopenia Likely due to sepsis.  Platelet count has improved to 136,000  Iron deficiency anemia Hemoglobinstable at 8.4 Patient's hemoglobin dropped after surgery, and was transfused 1 unit PRBC.  Anemia panel obtained on 9/15 showed iron 25, saturation 13%, TIBC 195, B12 440, reticulocyte count percent 6.1, immature fraction 27.8.  9/22 initiated iron TID with twice daily scheduled Colace to prevent iron-induced constipation Haptoglobin is normal at 202 10/1 iron has increased from 25 to 38 therefore we will continue oral iron replacement. No indication for IV iron   Procedures: 9/7 2D echocardiogram: 1. Left ventricular ejection fraction, by estimation, is 60 to 65%. The left ventricle has normal function. The left ventricle has no regional wall motion abnormalities. Left ventricular diastolic function could not be evaluated. 2. Right ventricular systolic function is normal. The right ventricular size is normal. There is normal pulmonary artery systolic pressure. The estimated right ventricular systolic pressure is 31.5 mmHg. 3. The mitral valve is normal in structure. No evidence of mitral valve regurgitation. No evidence of mitral  stenosis. 4. There are several large shaggy mobile densities one of which measures 1.27 x 1.54cm on the TV worrisome for  vegetations. The tricuspid valve is abnormal. 5. The aortic valve is normal in structure. Aortic valve regurgitation is not visualized. No aortic stenosis is present. 6. The inferior vena cava is normal in size with greater than 50% respiratory variability, suggesting right atrial pressure of 3 mmHg.  9/10 TEE: 1. Tricuspid valve endocarditis. 2. Left ventricular ejection fraction, by estimation, is 60 to 65%. The left ventricle has normal function. 3. Right ventricular systolic function is normal. The right ventricular size is normal. 4. No left atrial/left atrial appendage thrombus was detected. 5. Right atrial size was mildly dilated. 6. A small pericardial effusion is present. The pericardial effusion is circumferential. There is no evidence of cardiac tamponade. 7. The mitral valve is normal in structure. Trivial mitral valve regurgitation. 8. There are 2 separate elongated valvular vegetations along the medial and lateral tricuspid valve leaflets that are approximately 2 cm in length, residing mostly in the right atrial surface. There is associated severe tricuspid regurgitation.. The tricuspid valve is abnormal. Tricuspid valve regurgitation is severe. 9. The aortic valve is normal in structure. Aortic valve regurgitation is not visualized. 10. There are very small shaggy thin mobile echodensities on the pulmonic valve surface. Trivial pulmonic valve regurgitation. This may represent pulmonic  9/10 left shoulder arthroscopy with extensive debridement, I/D, excisional debridement of the labrum and bursa as well as left shoulder subacromial bursectomy  9/16 application of angio VAC with intraoperative TEE: Right heart cannulation - Debridement of right atrial mass - Debridement of tricuspid valve vegetation  10/4 tPA for occluded PICC   Observations/Objective: NAD.  A&Ox3   Assessment and Plan: 1. Normocytic anemia - Comprehensive metabolic panel; Future - CBC with  Differential/Platelet; Future  2. Opioid use disorder, severe, dependence (HCC) Continue f/up suboxone clinic  3. Chronic hepatitis C without hepatic coma (HCC) See ID as scheduled.   - CBC with Differential/Platelet; Future  4. Hospital discharge follow-up Has cardiology f/up scheduled-keep all appts  5. Bacteremia Resolved clinically - Comprehensive metabolic panel; Future - CBC with Differential/Platelet; Future  6. Hyponatremia - Comprehensive metabolic panel; Future    Follow Up Instructions: Assign PCP within 8 weeks   I discussed the assessment and treatment plan with the patient. The patient was provided an opportunity to ask questions and all were answered. The patient agreed with the plan and demonstrated an understanding of the instructions.   The patient was advised to call back or seek an in-person evaluation if the symptoms worsen or if the condition fails to improve as anticipated.  I provided 18 minutes of non-face-to-face time during this encounter.   Georgian Co, PA-C  Patient ID: Robert Kramer, male   DOB: 04-20-1976, 44 y.o.   MRN: [id number]

## 2020-05-10 ENCOUNTER — Encounter: Payer: Self-pay | Admitting: Thoracic Surgery (Cardiothoracic Vascular Surgery)

## 2020-05-10 NOTE — Progress Notes (Signed)
Internal Medicine Clinic Attending ° °Case discussed with Dr. Rehman  At the time of the visit.  We reviewed the resident’s history and exam and pertinent patient test results.  I agree with the assessment, diagnosis, and plan of care documented in the resident’s note.  ° °

## 2020-05-15 ENCOUNTER — Encounter: Payer: Self-pay | Admitting: Family

## 2020-05-15 ENCOUNTER — Ambulatory Visit (INDEPENDENT_AMBULATORY_CARE_PROVIDER_SITE_OTHER): Payer: Self-pay | Admitting: Family

## 2020-05-15 ENCOUNTER — Other Ambulatory Visit: Payer: Self-pay

## 2020-05-15 ENCOUNTER — Telehealth: Payer: Self-pay

## 2020-05-15 ENCOUNTER — Encounter: Payer: Self-pay | Admitting: Thoracic Surgery (Cardiothoracic Vascular Surgery)

## 2020-05-15 VITALS — BP 109/79 | HR 84 | Wt 138.0 lb

## 2020-05-15 DIAGNOSIS — M00012 Staphylococcal arthritis, left shoulder: Secondary | ICD-10-CM

## 2020-05-15 DIAGNOSIS — B182 Chronic viral hepatitis C: Secondary | ICD-10-CM

## 2020-05-15 DIAGNOSIS — F1120 Opioid dependence, uncomplicated: Secondary | ICD-10-CM

## 2020-05-15 DIAGNOSIS — I079 Rheumatic tricuspid valve disease, unspecified: Secondary | ICD-10-CM

## 2020-05-15 NOTE — Assessment & Plan Note (Signed)
Shoulder continues to have pain and discomfort which is significantly improved since leaving the hospital.  No current evidence of infection.  Check CRP and ESR.

## 2020-05-15 NOTE — Assessment & Plan Note (Signed)
Doing well and currently maintained on Suboxone through the internal medicine center.  Has routine follow-ups.  No cravings or relapses.  Continue management per internal medicine.

## 2020-05-15 NOTE — Progress Notes (Signed)
Subjective:    Patient ID: Robert Kramer, male    DOB: 08-11-1975, 44 y.o.   MRN: [id number]  Chief Complaint  Patient presents with  . Follow-up    HPI:  Robert Kramer is a 44 y.o. male with previous medical history significant for injection drug use and heroin addiction who presented to the ED with concerns for heroin withdrawal and blood cultures at the time positive for MRSA and Serratia.  CT scan of the chest performed showing multiple cavitary lesions consistent with septic emboli from endocarditis.  This was confirmed through transesophageal echocardiogram showing tricuspid valve endocarditis.  He was taken for arthroscopic irrigation and debridement of the shoulder as well as application of angio back for tricuspid valve endocarditis.  He was treated for 6 weeks with IV antibiotics.  Follow-up blood 10/18 at the end of treatment with recommendation for no further antibiotics and follow-up with ID clinic for treatment of hepatitis C.  Here today for routine hospital follow-up.  Robert Kramer has been doing well since leaving the hospital with some continued left shoulder pain that is significantly improved since leaving the hospital.  No current chest pain, fevers, chills, or sweats.  He has follow-up with Dr. Kipp Brood tomorrow.  On Suboxone for opioid use disorder with internal medicine center.  Blood work from the hospital with genotype 1b chronic hepatitis C with a viral load of 5.55 million.  Risk factor for acquiring hepatitis C is IV drug use.  Denies blood transfusions prior to 1992, sharing of razors/toothbrushes, or sexual contact with known positive partners.  No personal or family history of liver disease that he is aware of.  Has remained sober since leaving the hospital.  Has not received treatment to date.  No current abdominal symptoms and denies abdominal pain, nausea, vomiting, scleral icterus, or jaundice.  Allergies  Allergen Reactions  . Tramadol Other (See Comments)     Upset stomach      Outpatient Medications Prior to Visit  Medication Sig Dispense Refill  . buprenorphine-naloxone (SUBOXONE) 8-2 mg SUBL SL tablet Place 1 tablet under the tongue in the morning and at bedtime. 28 tablet 0   No facility-administered medications prior to visit.     Past Medical History:  Diagnosis Date  . Anxiety   . Bipolar 1 disorder ([place])   . Bipolar 1 disorder ([place])   . DDD (degenerative disc disease), lumbar   . Depression   . Drug-seeking behavior   . Hallux valgus with bunions   . Mood swings   . Polysubstance abuse [facility])       Past Surgical History:  Procedure Laterality Date  . APPLICATION OF ANGIOVAC N/A 03/21/2020   Procedure: APPLICATION OF [place];  Surgeon: Lajuana Matte, MD;  Location: [place];  Service: Vascular;  Laterality: N/A;  . fatty tumor removed from left foot    . SHOULDER ARTHROSCOPY Left 03/15/2020   Procedure: ARTHROSCOPY SHOULDER, IRRIGATION AND DEBRIDEMENT OF SHOULDER BURSECTOMY;  Surgeon: Marchia Bond, MD;  Location: [place];  Service: Orthopedics;  Laterality: Left;  . TEE WITHOUT CARDIOVERSION N/A 03/15/2020   Procedure: TRANSESOPHAGEAL ECHOCARDIOGRAM (TEE);  Surgeon: Jerline Pain, MD;  Location: [facility] OR;  Service: Cardiovascular;  Laterality: N/A;      Family History  Problem Relation Age of Onset  . Cancer Father       Social History   Socioeconomic History  . Marital status: Legally Separated    Spouse name: Not on file  . Number  of children: Not on file  . Years of education: Not on file  . Highest education level: Not on file  Occupational History  . Not on file  Tobacco Use  . Smoking status: Current Every Day Smoker    Packs/day: 0.50    Years: 5.00    Pack years: 2.50    Types: Cigarettes  . Smokeless tobacco: Never Used  Substance and Sexual Activity  . Alcohol use: Not Currently    Alcohol/week: 1.0 standard drink    Types: 1 Cans of beer per week    Comment: occ  . Drug use: No     Types: Cocaine, Marijuana    Comment: opiates - quit 2017  . Sexual activity: Not on file  Other Topics Concern  . Not on file  Social History Narrative  . Not on file   Social Determinants of Health   Financial Resource Strain:   . Difficulty of Paying Living Expenses: Not on file  Food Insecurity:   . Worried About Charity fundraiser in the Last Year: Not on file  . Ran Out of Food in the Last Year: Not on file  Transportation Needs:   . Lack of Transportation (Medical): Not on file  . Lack of Transportation (Non-Medical): Not on file  Physical Activity:   . Days of Exercise per Week: Not on file  . Minutes of Exercise per Session: Not on file  Stress:   . Feeling of Stress : Not on file  Social Connections:   . Frequency of Communication with Friends and Family: Not on file  . Frequency of Social Gatherings with Friends and Family: Not on file  . Attends Religious Services: Not on file  . Active Member of Clubs or Organizations: Not on file  . Attends Archivist Meetings: Not on file  . Marital Status: Not on file  Intimate Partner Violence:   . Fear of Current or Ex-Partner: Not on file  . Emotionally Abused: Not on file  . Physically Abused: Not on file  . Sexually Abused: Not on file      Review of Systems  Constitutional: Negative for chills, diaphoresis, fatigue and fever.  Respiratory: Negative for cough, chest tightness, shortness of breath and wheezing.   Cardiovascular: Negative for chest pain.  Gastrointestinal: Negative for abdominal distention, abdominal pain, constipation, diarrhea, nausea and vomiting.  Neurological: Negative for weakness and headaches.  Hematological: Does not bruise/bleed easily.       Objective:    BP 109/79   Pulse 84   Wt 138 lb (62.6 kg)   BMI 19.25 kg/m  Nursing note and vital signs reviewed.  Physical Exam Constitutional:      General: He is not in acute distress.    Appearance: He is well-developed.    Cardiovascular:     Rate and Rhythm: Normal rate and regular rhythm.     Heart sounds: Normal heart sounds. No murmur heard.  No friction rub. No gallop.   Pulmonary:     Effort: Pulmonary effort is normal. No respiratory distress.     Breath sounds: Normal breath sounds. No wheezing or rales.  Chest:     Chest wall: No tenderness.  Abdominal:     General: Bowel sounds are normal. There is no distension.     Palpations: Abdomen is soft. There is no mass.     Tenderness: There is no abdominal tenderness. There is no guarding or rebound.  Skin:    General: Skin  is warm and dry.  Neurological:     Mental Status: He is alert and oriented to person, place, and time.  Psychiatric:        Behavior: Behavior normal.        Thought Content: Thought content normal.        Judgment: Judgment normal.         Assessment & Plan:   Patient Active Problem List   Diagnosis Date Noted  . Pressure injury of skin 03/20/2020  . Arthritis, septic, shoulder ([place]) 03/18/2020  . 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 Hepatitis C virus infection 03/18/2020  . Normocytic anemia 03/18/2020  . Bipolar 1 disorder ([place])   . Opioid use disorder, severe, dependence ([place]) 04/15/2013     Problem List Items Addressed This Visit      Cardiovascular and Mediastinum   Endocarditis of tricuspid valve - Primary    Robert Kramer completed 6 weeks of IV therapy while in the hospital and currently off antibiotic therapy.  Check blood cultures for surveillance today.  Has follow-up with Dr. Kipp Brood of cardiovascular thoracic surgery tomorrow.      Relevant Orders   Blood culture (routine single)   Blood culture (routine single)   C-reactive protein   Sedimentation rate     Digestive   Hepatitis C virus infection (Chronic)    Robert Kramer has genotype 1b chronic hepatitis C with initial viral load of 5.55 million likely obtained from IV drug use.  He is treatment nave and asymptomatic.  We reviewed the  pathogenesis, risks of left untreated, transmission, prevention, and treatment options for hepatitis C and he wishes to continue.  He met with pharmacy staff for medication assistance.  Obtain hepatic panel and liver fibrosis score.  Plan for [facility] or Epclusa pending blood work results.      Relevant Orders   Liver Fibrosis, FibroTest-ActiTest   Protime-INR   Hepatic function panel   Basic metabolic panel     Musculoskeletal and Integument   Arthritis, septic, shoulder (HCC)    Shoulder continues to have pain and discomfort which is significantly improved since leaving the hospital.  No current evidence of infection.  Check CRP and ESR.        Other   Opioid use disorder, severe, dependence ([place]) (Chronic)    Doing well and currently maintained on Suboxone through the internal medicine center.  Has routine follow-ups.  No cravings or relapses.  Continue management per internal medicine.          I am having Tristain Daily. Mould maintain his buprenorphine-naloxone.   Follow-up: Pending blood work results and after initiation of medication   Terri Piedra, MSN, FNP-C Nurse Practitioner [facility] for [place] number: [phone]

## 2020-05-15 NOTE — Assessment & Plan Note (Signed)
Robert Kramer has genotype 1b chronic hepatitis C with initial viral load of 5.55 million likely obtained from IV drug use.  He is treatment nave and asymptomatic.  We reviewed the pathogenesis, risks of left untreated, transmission, prevention, and treatment options for hepatitis C and he wishes to continue.  He met with pharmacy staff for medication assistance.  Obtain hepatic panel and liver fibrosis score.  Plan for [facility] or Epclusa pending blood work results.

## 2020-05-15 NOTE — Patient Instructions (Signed)
Nice to see you.  We will check your blood work today.  Once we receive your blood work results we will get you started on medication as appropriate.  Continue to follow-up with Dr. Cliffton Asters  Plan for follow-up 1 month after starting medication.  Limit acetaminophen (Tylenol) usage to no more than 2 grams (2,000 mg) per day.  Avoid alcohol.  Do not share toothbrushes or razors.  Practice safe sex to protect against transmission as well as sexually transmitted disease.    Hepatitis C Hepatitis C is a viral infection of the liver. It can lead to scarring of the liver (cirrhosis), liver failure, or liver cancer. Hepatitis C may go undetected for months or years because people with the infection may not have symptoms, or they may have only mild symptoms. What are the causes? This condition is caused by the hepatitis C virus (HCV). The virus can spread from person to person (is contagious) through:  Blood.  Childbirth. A woman who has hepatitis C can pass it to her baby during birth.  Bodily fluids, such as breast milk, tears, semen, vaginal fluids, and saliva.  Blood transfusions or organ transplants done in the [place] before 1992.  What increases the risk? The following factors may make you more likely to develop this condition:  Having contact with unclean (contaminated) needles or syringes. This may result from: ? Acupuncture. ? Tattoing. ? Body piercing. ? Injecting drugs.  Having unprotected sex with someone who is infected.  Needing treatment to filter your blood (kidney dialysis).  Having HIV (human immunodeficiency virus) or AIDS (acquired immunodeficiency syndrome).  Working in a job that involves contact with blood or bodily fluids, such as health care.  What are the signs or symptoms? Symptoms of this condition include:  Fatigue.  Loss of appetite.  Nausea.  Vomiting.  Abdominal pain.  Dark yellow urine.  Yellowish skin and eyes  (jaundice).  Itchy skin.  Clay-colored bowel movements.  Joint pain.  Bleeding and bruising easily.  Fluid building up in your stomach (ascites).  In some cases, you may not have any symptoms. How is this diagnosed? This condition is diagnosed with:  Blood tests.  Other tests to check how well your liver is functioning. They may include: ? Magnetic resonance elastography (MRE). This imaging test uses MRIs and sound waves to measure liver stiffness. ? Transient elastography. This imaging test uses ultrasounds to measure liver stiffness. ? Liver biopsy. This test requires taking a small tissue sample from your liver to examine it under a microscope.  How is this treated? Your health care provider may perform noninvasive tests or a liver biopsy to help decide the best course of treatment. Treatment may include:  Antiviral medicines and other medicines.  Follow-up treatments every 6-12 months for infections or other liver conditions.  Receiving a donated liver (liver transplant).  Follow these instructions at home: Medicines  Take over-the-counter and prescription medicines only as told by your health care provider.  Take your antiviral medicine as told by your health care provider. Do not stop taking the antiviral even if you start to feel better.  Do not take any medicines unless approved by your health care provider, including over-the-counter medicines and birth control pills. Activity  Rest as needed.  Do not have sex unless approved by your health care provider.  Ask your health care provider when you may return to school or work. Eating and drinking  Eat a balanced diet with plenty of fruits and vegetables,  whole grains, and lowfat (lean) meats or non-meat proteins (such as beans or tofu).  Drink enough fluids to keep your urine clear or pale yellow.  Do not drink alcohol. General instructions  Do not share toothbrushes, nail clippers, or razors.  Wash  your hands frequently with soap and water. If soap and water are not available, use hand sanitizer.  Cover any cuts or open sores on your skin to prevent spreading the virus.  Keep all follow-up visits as told by your health care provider. This is important. You may need follow-up visits every 6-12 months. How is this prevented? There is no vaccine for hepatitis C. The only way to prevent the disease is to reduce the risk of exposure to the virus. Make sure you:  Wash your hands frequently with soap and water. If soap and water are not available, use hand sanitizer.  Do not share needles or syringes.  Practice safe sex and use condoms.  Avoid handling blood or bodily fluids without gloves or other protection.  Avoid getting tattoos or piercings in shops or other locations that are not clean.  Contact a health care provider if:  You have a fever.  You develop abdominal pain.  You pass dark urine.  You pass clay-colored stools.  You develop joint pain. Get help right away if:  You have increasing fatigue or weakness.  You lose your appetite.  You cannot eat or drink without vomiting.  You develop jaundice or your jaundice gets worse.  You bruise or bleed easily. Summary  Hepatitis C is a viral infection of the liver. It can lead to scarring of the liver (cirrhosis), liver failure, or liver cancer.  The hepatitis C virus (HCV) causes this condition. The virus can pass from person to person (is contagious).  You should not take any medicines unless approved by your health care provider. This includes over-the-counter medicines and birth control pills. This information is not intended to replace advice given to you by your health care provider. Make sure you discuss any questions you have with your health care provider. Document Released: 06/19/2000 Document Revised: 07/28/2016 Document Reviewed: 07/28/2016 Elsevier Interactive Patient Education  [employer].

## 2020-05-15 NOTE — Telephone Encounter (Signed)
[facility] Patient Product/process development scientist completed.    The patient is uninsured and I received his HHS & income as well as signatures for [employer] My Denyse Amass Application for  patient assistance for medication.  We can complete the application and will need to meet with the patient for signatures and income documentation.  Clearance Coots, CPhT Specialty Pharmacy Patient [facility] Phone: [phone] Fax:  [phone]

## 2020-05-15 NOTE — Assessment & Plan Note (Signed)
Mr. Clanton completed 6 weeks of IV therapy while in the hospital and currently off antibiotic therapy.  Check blood cultures for surveillance today.  Has follow-up with Dr. Cliffton Asters of cardiovascular thoracic surgery tomorrow.

## 2020-05-16 ENCOUNTER — Encounter: Payer: Self-pay | Admitting: Thoracic Surgery (Cardiothoracic Vascular Surgery)

## 2020-05-21 ENCOUNTER — Other Ambulatory Visit: Payer: Self-pay | Admitting: Internal Medicine

## 2020-05-21 ENCOUNTER — Ambulatory Visit: Payer: Self-pay | Attending: Physician Assistant

## 2020-05-21 ENCOUNTER — Other Ambulatory Visit: Payer: Self-pay

## 2020-05-21 ENCOUNTER — Ambulatory Visit (INDEPENDENT_AMBULATORY_CARE_PROVIDER_SITE_OTHER): Payer: Self-pay | Admitting: Internal Medicine

## 2020-05-21 DIAGNOSIS — E871 Hypo-osmolality and hyponatremia: Secondary | ICD-10-CM

## 2020-05-21 DIAGNOSIS — D649 Anemia, unspecified: Secondary | ICD-10-CM

## 2020-05-21 DIAGNOSIS — F1120 Opioid dependence, uncomplicated: Secondary | ICD-10-CM

## 2020-05-21 DIAGNOSIS — B182 Chronic viral hepatitis C: Secondary | ICD-10-CM

## 2020-05-21 DIAGNOSIS — R7881 Bacteremia: Secondary | ICD-10-CM

## 2020-05-21 LAB — CULTURE, BLOOD (SINGLE)
MICRO NUMBER:: 11203061
Result:: NO GROWTH
SPECIMEN QUALITY:: ADEQUATE

## 2020-05-21 MED ORDER — BUPRENORPHINE HCL-NALOXONE HCL 8-2 MG SL SUBL: 1.0000 | SUBLINGUAL_TABLET | Freq: Two times a day (BID) | SUBLINGUAL | 1 refills | Status: DC

## 2020-05-21 MED FILL — BUPRENORPHIN-NALOXON 8-2 MG: 8-2 | 14 days supply | Qty: 28 | Fill #0

## 2020-05-21 NOTE — Patient Instructions (Signed)

## 2020-05-21 NOTE — Progress Notes (Signed)
° °  05/21/2020  Robert Kramer presents for follow up of opioid use disorder I have reviewed the prior induction visit, follow up visits, and telephone encounters relevant to opiate use disorder (OUD) treatment.   Current daily dose: 8-2 mg BID  Date of Induction: 03/11/20  Current follow up interval, in weeks: 2 weeks  The patient has been adherent with the buprenorphine for OUD contract.   Last UDS Result: 04/24/2020 and appropriate. Buprenorphine and metabolite present. THC.   HPI: This is a 44 year old with a history of OUD who presented for follow up of his OUD.  He reports he has been adherent to Suboxone therapy which is controlling cravings well. Denies any relapse and feels he is doing well on Suboxone therapy.   Exam:   Vitals:   05/21/20 0951  BP: 109/75  Pulse: 74  Temp: 98 F (36.7 C)  TempSrc: Oral  SpO2: 99%  Weight: 141 lb 3.2 oz (64 kg)    General: NAD, nl appearance Cardiovascular: Normal rate, regular rhythm.  No murmurs, rubs, or gallops Pulmonary : Effort normal, breath sounds normal. No wheezes, rales, or rhonchi Abdominal: soft, nontender,  bowel sounds present       Assessment/Plan:  See Problem Based Charting in the Encounters Tab     Albertha Ghee, MD  05/21/2020  10:02 AM

## 2020-05-21 NOTE — Assessment & Plan Note (Signed)
Induction date 03/11/20, monitored on 2 week intervals and UDS has been appropriate. He has been adherent to Suboxone therapy which is controlling cravings well. Denies any relapse and feels he is doing well on Suboxone therapy.   Assessment: Opioid use disorder, severe, dependence (HCC) Plan:  - buprenorphine-naloxone (SUBOXONE) 8-2 mg SUBL SL tablet; Place 1 tablet under the tongue in the morning and at bedtime.  Dispense: 28 tablet; Refill: 1 -Follow-up in 4 weeks

## 2020-05-22 ENCOUNTER — Other Ambulatory Visit: Payer: Self-pay | Admitting: Physician Assistant

## 2020-05-22 DIAGNOSIS — B182 Chronic viral hepatitis C: Secondary | ICD-10-CM

## 2020-05-22 DIAGNOSIS — R7989 Other specified abnormal findings of blood chemistry: Secondary | ICD-10-CM

## 2020-05-22 DIAGNOSIS — D696 Thrombocytopenia, unspecified: Secondary | ICD-10-CM

## 2020-05-22 LAB — COMPREHENSIVE METABOLIC PANEL
ALT: 111 IU/L — ABNORMAL HIGH (ref 0–44)
AST: 204 IU/L — ABNORMAL HIGH (ref 0–40)
Albumin/Globulin Ratio: 0.9 — ABNORMAL LOW (ref 1.2–2.2)
Albumin: 3.8 g/dL — ABNORMAL LOW (ref 4.0–5.0)
Alkaline Phosphatase: 135 IU/L — ABNORMAL HIGH (ref 44–121)
BUN/Creatinine Ratio: 14 (ref 9–20)
BUN: 12 mg/dL (ref 6–24)
Bilirubin Total: 0.7 mg/dL (ref 0.0–1.2)
CO2: 22 mmol/L (ref 20–29)
Calcium: 10 mg/dL (ref 8.7–10.2)
Chloride: 101 mmol/L (ref 96–106)
Creatinine, Ser: 0.88 mg/dL (ref 0.76–1.27)
GFR calc Af Amer: 119 mL/min/{1.73_m2} (ref >59)
GFR calc non Af Amer: 103 mL/min/{1.73_m2} (ref >59)
Globulin, Total: 4.4 g/dL (ref 1.5–4.5)
Glucose: 99 mg/dL (ref 65–99)
Potassium: 3.9 mmol/L (ref 3.5–5.2)
Sodium: 137 mmol/L (ref 134–144)
Total Protein: 8.2 g/dL (ref 6.0–8.5)

## 2020-05-22 LAB — CBC WITH DIFFERENTIAL/PLATELET
Basophils Absolute: 0 10*3/uL (ref 0.0–0.2)
Basos: 0 %
EOS (ABSOLUTE): 0 10*3/uL (ref 0.0–0.4)
Eos: 0 %
Hematocrit: 43.7 % (ref 37.5–51.0)
Hemoglobin: 14.1 g/dL (ref 13.0–17.7)
Immature Grans (Abs): 0 10*3/uL (ref 0.0–0.1)
Immature Granulocytes: 0 %
Lymphocytes Absolute: 2.7 10*3/uL (ref 0.7–3.1)
Lymphs: 40 %
MCH: 28 pg (ref 26.6–33.0)
MCHC: 32.3 g/dL (ref 31.5–35.7)
MCV: 87 fL (ref 79–97)
Monocytes Absolute: 0.7 10*3/uL (ref 0.1–0.9)
Monocytes: 10 %
Neutrophils Absolute: 3.3 10*3/uL (ref 1.4–7.0)
Neutrophils: 50 %
Platelets: 78 10*3/uL — CL (ref 150–450)
RBC: 5.04 x10E6/uL (ref 4.14–5.80)
RDW: 13.8 % (ref 11.6–15.4)
WBC: 6.8 10*3/uL (ref 3.4–10.8)

## 2020-05-23 NOTE — Progress Notes (Signed)
Internal Medicine Clinic Attending  Case discussed with Dr. Steen  At the time of the visit.  We reviewed the resident's history and exam and pertinent patient test results.  I agree with the assessment, diagnosis, and plan of care documented in the resident's note.  

## 2020-05-24 LAB — HEPATIC FUNCTION PANEL
AG Ratio: 0.7 (calc) — ABNORMAL LOW (ref 1.0–2.5)
ALT: 85 U/L — ABNORMAL HIGH (ref 9–46)
AST: 143 U/L — ABNORMAL HIGH (ref 10–40)
Albumin: 3.7 g/dL (ref 3.6–5.1)
Alkaline phosphatase (APISO): 118 U/L (ref 36–130)
Bilirubin, Direct: 0.2 mg/dL (ref 0.0–0.2)
Globulin: 5.1 g/dL (calc) — ABNORMAL HIGH (ref 1.9–3.7)
Indirect Bilirubin: 0.5 mg/dL (calc) (ref 0.2–1.2)
Total Bilirubin: 0.7 mg/dL (ref 0.2–1.2)
Total Protein: 8.8 g/dL — ABNORMAL HIGH (ref 6.1–8.1)

## 2020-05-24 LAB — BASIC METABOLIC PANEL
BUN: 14 mg/dL (ref 7–25)
CO2: 31 mmol/L (ref 20–32)
Calcium: 10 mg/dL (ref 8.6–10.3)
Chloride: 101 mmol/L (ref 98–110)
Creat: 0.84 mg/dL (ref 0.60–1.35)
Glucose, Bld: 97 mg/dL (ref 65–99)
Potassium: 4 mmol/L (ref 3.5–5.3)
Sodium: 136 mmol/L (ref 135–146)

## 2020-05-24 LAB — CULTURE, BLOOD (SINGLE): MICRO NUMBER:: 11191270

## 2020-05-24 LAB — PROTIME-INR
INR: 1.1
Prothrombin Time: 11.8 s — ABNORMAL HIGH (ref 9.0–11.5)

## 2020-05-24 LAB — LIVER FIBROSIS, FIBROTEST-ACTITEST
ALT: 80 U/L — ABNORMAL HIGH (ref 9–46)
Alpha-2-Macroglobulin: 345 mg/dL — ABNORMAL HIGH (ref 106–279)
Apolipoprotein A1: 111 mg/dL (ref 94–176)
Bilirubin: 0.6 mg/dL (ref 0.2–1.2)
Fibrosis Score: 0.64
GGT: 42 U/L (ref 3–95)
Haptoglobin: 101 mg/dL (ref 43–212)
Necroinflammat ACT Score: 0.6
Reference ID: 3632445

## 2020-05-24 LAB — C-REACTIVE PROTEIN: CRP: 3.2 mg/L (ref <8.0)

## 2020-05-24 LAB — SEDIMENTATION RATE: Sed Rate: 48 mm/h — ABNORMAL HIGH (ref 0–15)

## 2020-06-03 MED FILL — BUPRENORPHIN-NALOXON 8-2 MG: 8-2 | 14 days supply | Qty: 28 | Fill #1

## 2020-06-18 ENCOUNTER — Other Ambulatory Visit: Payer: Self-pay

## 2020-06-18 ENCOUNTER — Other Ambulatory Visit: Payer: Self-pay | Admitting: Internal Medicine

## 2020-06-18 ENCOUNTER — Ambulatory Visit (INDEPENDENT_AMBULATORY_CARE_PROVIDER_SITE_OTHER): Payer: Self-pay | Admitting: Internal Medicine

## 2020-06-18 DIAGNOSIS — F1120 Opioid dependence, uncomplicated: Secondary | ICD-10-CM

## 2020-06-18 MED ORDER — BUPRENORPHINE HCL-NALOXONE HCL 8-2 MG SL SUBL: 1.0000 | SUBLINGUAL_TABLET | Freq: Two times a day (BID) | SUBLINGUAL | 1 refills | Status: DC

## 2020-06-18 MED FILL — BUPRENORPHIN-NALOXON 8-2 MG: 8-2 | 14 days supply | Qty: 28 | Fill #0

## 2020-06-18 NOTE — Progress Notes (Signed)
   06/18/2020  Robert Kramer presents for follow up of opioid use disorder I have reviewed the prior induction visit, follow up visits, and telephone encounters relevant to opiate use disorder (OUD) treatment.   Current daily dose: 8-2 mg twice daily   Date of Induction: 03/11/20  Current follow up interval, in weeks: 4 weeks  The patient has been adherent with the buprenorphine for OUD contract.   Last UDS Result: 04/24/2020, expected, Carboxy-THC  HPI:  Robert Kramer is a 44 y/o male who presents to the clinic for his OUD. He states he is doing well on his current regimen of Suboxone. He states that he has no cravings, relapses, or drug seeking behaviors.   Exam:   Vitals:   06/18/20 1001  Weight: 150 lb 12.8 oz (68.4 kg)  Height: 5\' 11"  (1.803 m)    Physical Exam Constitutional:      General: He is not in acute distress.    Appearance: He is normal weight. He is not ill-appearing or toxic-appearing.  Cardiovascular:     Rate and Rhythm: Normal rate and regular rhythm.     Pulses: Normal pulses.     Heart sounds: Normal heart sounds. No murmur heard. No friction rub. No gallop.   Pulmonary:     Effort: Pulmonary effort is normal.     Breath sounds: Normal breath sounds. No wheezing, rhonchi or rales.  Abdominal:     General: Abdomen is flat. Bowel sounds are normal.     Tenderness: There is no abdominal tenderness. There is no guarding.  Neurological:     Mental Status: He is alert and oriented to person, place, and time.  Psychiatric:        Mood and Affect: Mood normal.        Behavior: Behavior normal.     Assessment/Plan:  See Problem Based Charting in the Encounters Tab  , MD  06/18/2020  10:04 AM

## 2020-06-18 NOTE — Patient Instructions (Signed)
To Mr. Devera,   It was nice meeting you today! I am glad that you are doing well on your medicine. We will refill your Suboxone today, and see you in a month! I hope you have a happy holiday season! Sincerely,  Dolan Amen, MD

## 2020-06-18 NOTE — Assessment & Plan Note (Signed)
Patient doing well overall. He is adherent to his current regimen, and is happy with his current regimen. He denies relapse, cravings, or withdrawal symptoms.  - Refill Suboxone 8-2 mg tablet twice daily - FU in 4 weeks - Tox screen at next appointment

## 2020-06-19 ENCOUNTER — Telehealth: Payer: Self-pay | Admitting: General Practice

## 2020-06-19 NOTE — Progress Notes (Signed)
Internal Medicine Clinic Attending  Case discussed with Dr. Sande Brothers  At the time of the visit.  We reviewed the resident's history and exam and pertinent patient test results.  I agree with the assessment, diagnosis, and plan of care documented in the resident's note.   PDMP was reviewed and appropriate.

## 2020-06-19 NOTE — Telephone Encounter (Signed)
Pt received JJ in oct 2021 in [place] at [facility]

## 2020-07-02 MED FILL — BUPRENORPHIN-NALOXON 8-2 MG: 8-2 | 14 days supply | Qty: 28 | Fill #1

## 2020-07-15 ENCOUNTER — Other Ambulatory Visit: Payer: Self-pay | Admitting: Internal Medicine

## 2020-07-15 ENCOUNTER — Telehealth: Payer: Self-pay

## 2020-07-15 DIAGNOSIS — F1120 Opioid dependence, uncomplicated: Secondary | ICD-10-CM

## 2020-07-15 MED ORDER — BUPRENORPHINE HCL-NALOXONE HCL 8-2 MG SL SUBL: 1.0000 | SUBLINGUAL_TABLET | Freq: Two times a day (BID) | SUBLINGUAL | 1 refills | Status: DC

## 2020-07-15 MED FILL — BUPRENORPHIN-NALOXON 8-2 MG: 8-2 | 14 days supply | Qty: 28 | Fill #0

## 2020-07-15 NOTE — Telephone Encounter (Signed)
Pls contact [phone] regarding for his OUD appt; pt is stating some one gave him an appt for tomorrow

## 2020-07-15 NOTE — Telephone Encounter (Signed)
Returned call to patient. States he thought he had a 4 week f/u scheduled for tomorrow. No available appts till 07/23/2020. Scheduled for 07/23/2020 at 0945. Requesting refill on suboxone at [facility] to get him to that appt. Kinnie Feil, BSN, RN-BC

## 2020-07-15 NOTE — Telephone Encounter (Signed)
Sent in a 2 week Rx, but he still needs to come in next week.   Thanks!

## 2020-07-15 NOTE — Telephone Encounter (Signed)
Left detailed message on patient's self-identified VM with this information. L. Henryetta Corriveau, BSN, RN-BC       

## 2020-07-23 ENCOUNTER — Other Ambulatory Visit: Payer: Self-pay

## 2020-07-23 DIAGNOSIS — F1120 Opioid dependence, uncomplicated: Secondary | ICD-10-CM

## 2020-07-23 MED ORDER — BUPRENORPHINE HCL-NALOXONE HCL 8-2 MG SL SUBL: 1.0000 | SUBLINGUAL_TABLET | Freq: Two times a day (BID) | SUBLINGUAL | 1 refills | Status: DC

## 2020-07-23 NOTE — Telephone Encounter (Signed)
Pt is requesting his buprenorphine-naloxone (SUBOXONE) 8-2 mg SUBL SL tablet to be sent to  [facility] - [place], [place] - [address] [facility]. Phone:  [phone]  Fax:  [phone]

## 2020-07-23 NOTE — Telephone Encounter (Signed)
Next appt scheduled 1/25 in OUD clinic.

## 2020-07-30 ENCOUNTER — Ambulatory Visit (INDEPENDENT_AMBULATORY_CARE_PROVIDER_SITE_OTHER): Payer: Self-pay | Admitting: Internal Medicine

## 2020-07-30 ENCOUNTER — Encounter: Payer: Self-pay | Admitting: Internal Medicine

## 2020-07-30 ENCOUNTER — Other Ambulatory Visit: Payer: Self-pay

## 2020-07-30 VITALS — BP 111/73 | HR 71 | Temp 98.0°F

## 2020-07-30 DIAGNOSIS — F319 Bipolar disorder, unspecified: Secondary | ICD-10-CM

## 2020-07-30 DIAGNOSIS — F1120 Opioid dependence, uncomplicated: Secondary | ICD-10-CM

## 2020-07-30 MED FILL — BUPRENORPHIN-NALOXON 8-2 MG: 8-2 | 14 days supply | Qty: 28 | Fill #1

## 2020-07-30 NOTE — Progress Notes (Signed)
Last office visit 06/18/20  Patient doing well overall. He is adherent to his current regimen, and is happy with his current regimen. He denies relapse, cravings, or withdrawal symptoms.  - Refill Suboxone 8-2 mg tablet twice daily - FU in 4 weeks - Tox screen at next appointment  Robert Kramer presents for follow up of opioid use disorder I have reviewed the prior induction visit, follow up visits, and telephone encounters relevant to opiate use disorder (OUD) treatment.   Current daily dose:   Date of Induction:  Current follow up interval, in weeks:   The patient has been adherent with the buprenorphine for OUD contract.   Last UDS Result: 04/24/2020, expected, Carboxy-THC   PDMP: last filled on 01/10 for 14 day supply        07/30/2020  Robert Kramer presents for follow up of opioid use disorder I have reviewed the prior induction visit, follow up visits, and telephone encounters relevant to opiate use disorder (OUD) treatment.   Current daily dose: 8-2 mg twice daily   Date of Induction:  03/11/20  Current follow up interval, in weeks: 4 weeks  The patient has been adherent with the buprenorphine for OUD contract.   Last UDS Result: 04/24/2020, expected, Carboxy-THC  HPI: This is a 45 y.o. old male living with OUD who presents for a follow up appointment for OUD. He admits to adherence to his medication as prescribed. He denies cravings and highlights the following triggers: none. He denies any relapses since his last appointment.    Exam:   Vitals:   07/30/20 0916  BP: 111/73  Pulse: 71  Temp: 98 F (36.7 C)  TempSrc: Oral  SpO2: 98%   Physical Exam Constitutional:      Appearance: Normal appearance.  HENT:     Head: Normocephalic and atraumatic.  Eyes:     Extraocular Movements: Extraocular movements intact.  Cardiovascular:     Rate and Rhythm: Normal rate and regular rhythm.     Pulses: Normal pulses.     Heart sounds: Normal heart sounds.   Pulmonary:     Effort: Pulmonary effort is normal.     Breath sounds: Normal breath sounds.  Abdominal:     General: There is no distension.     Palpations: Abdomen is soft.     Tenderness: There is no abdominal tenderness.  Musculoskeletal:        General: Normal range of motion.     Cervical back: Normal range of motion.  Skin:    General: Skin is dry.  Neurological:     General: No focal deficit present.     Mental Status: He is alert. Mental status is at baseline.  Psychiatric:        Mood and Affect: Mood normal.      Assessment/Plan:  See Problem Based Charting in the Encounters Tab   Chari Manning, D.O.  Internal Medicine Resident, PGY-2 [facility]  Pager: [phone] 11:51 AM, 07/30/2020

## 2020-07-30 NOTE — Assessment & Plan Note (Signed)
Patient presents today for OUD clinic follow up, but states that he his having more anxiety and mood swing.  He states that this started once stopping his nonprescription drug use in September, which is approximately 5 months ago.  He admits to periods of increased energy intermixed with periods of depressed mood, but states that this can occur within 2-3 hour intervals.  Does admit to increased worrying/anxiety that he has trouble controlling, fatigue, difficulty concentrating, and trouble sleeping. GAD7 and PHQ9 were performed today and were 7/21 and 6/27, respectively.   Is difficult to determine if his mood changes are secondary to adjustment disorder since stopping his nonprescription drug use versus recurrence of his bipolar 1 disorder.  I do not have patient's records regarding his diagnosis of bipolar 1 disorder, therefore I cannot rule out substance-induced mood disorder.   Patient previously saw a therapist in [place], [place] and is interested in restarting counseling at this time.  We will continue to trend the patient's PHQ-9 and GAD7 and consider starting him on an SSRI at his follow-up appointment if his symptoms continue.  Plan: -Submit referral to Dr. Sallyanne Kuster for counseling -Consider starting SSRI medication at follow-up appointment if symptoms persist past the 65-month mark. -Trend PHQ-9 and GAD-7 with each appointment particularly to determine response to treatment.

## 2020-07-30 NOTE — Progress Notes (Signed)
Internal Medicine Clinic Attending  Case discussed with Dr. Coe  At the time of the visit.  We reviewed the resident's history and exam and pertinent patient test results.  I agree with the assessment, diagnosis, and plan of care documented in the resident's note.  

## 2020-07-30 NOTE — Patient Instructions (Addendum)
Thank you, Mr.Samantha E Dedman for allowing [place] to provide your care today. Today we discussed opioid use disorder.    I have ordered the following labs for you:   Lab Orders     ToxAssure Select,+Antidepr,UR   Tests ordered today:  none  Referrals ordered today:   I will put in a referral for counseling today.  I have ordered the following medication/changed the following medications:   Stop the following medications: There are no discontinued medications.   Start the following medications: No orders of the defined types were placed in this encounter.    Follow up: 4 weeks in the office.    Remember:   Should you have any questions or concerns please call the internal medicine clinic at [phone].     Dellia Cloud, D.O. [facility]

## 2020-07-30 NOTE — Assessment & Plan Note (Addendum)
Patient presents for follow-up appointment for her ED.  Patient states that he is doing well on his current dose of Suboxone, 8-2 mg twice daily.  Patient states that he has been experiencing mood swings that he characterized as anxiety.  Patient has a previous diagnosis of bipolar 1 disorder and was previously seeing a Veterinary surgeon in [place].  He does admit to difficulty concentrating, trouble controlling things he worries about, trouble with sleeping, and anhedonia.  Considering he recently stopped using nonprescription opioid medications since September 2021, it is difficult to determine if this is adjustment disorder versus his underlying bipolar 1 disorder. Particularly because I am unsure if the diagnosis of bipolar 1 was made during periods of substance use.  Otherwise, patient denies any cravings, withdrawal symptoms, or relapses since his last appointment.  His last U tox was on 04/24/2020 and appropriate.  Patient highlights transportation as a barrier for him.  Particularly with obtaining a job.  He does live with his sister in [place] who helps with transportation.  His partner who is also a good source of support for him and states that she is helping him gain employment where she works.  Plan: - Performed PHQ9/GAD7 today, refer to counseling with Dr. Monna Fam. - Patient has a refill of Suboxone 8-2mg  BID at the pharmacy.  - Utox performed today.  - Follow up in 4 weeks in office.

## 2020-07-31 ENCOUNTER — Telehealth: Payer: Self-pay | Admitting: General Practice

## 2020-07-31 NOTE — Telephone Encounter (Signed)
Sister answered pt's phone.  She states pt has been covid vaccinated.  She states he has had the [employer] vaccine.

## 2020-08-06 ENCOUNTER — Encounter: Payer: Self-pay | Admitting: General Practice

## 2020-08-07 LAB — TOXASSURE SELECT,+ANTIDEPR,UR

## 2020-08-13 MED FILL — BUPRENORPHIN-NALOXON 8-2 MG: 8-2 | 14 days supply | Qty: 28 | Fill #0

## 2020-08-27 ENCOUNTER — Ambulatory Visit (INDEPENDENT_AMBULATORY_CARE_PROVIDER_SITE_OTHER): Payer: 59 | Admitting: Internal Medicine

## 2020-08-27 ENCOUNTER — Institutional Professional Consult (permissible substitution): Payer: Self-pay | Admitting: Behavioral Health

## 2020-08-27 ENCOUNTER — Other Ambulatory Visit: Payer: Self-pay | Admitting: Student in an Organized Health Care Education/Training Program

## 2020-08-27 VITALS — BP 119/78 | HR 70 | Temp 97.9°F | Wt 164.9 lb

## 2020-08-27 DIAGNOSIS — F1120 Opioid dependence, uncomplicated: Secondary | ICD-10-CM

## 2020-08-27 DIAGNOSIS — B182 Chronic viral hepatitis C: Secondary | ICD-10-CM

## 2020-08-27 MED ORDER — BUPRENORPHINE HCL-NALOXONE HCL 8-2 MG SL SUBL
1.0000 | SUBLINGUAL_TABLET | Freq: Two times a day (BID) | SUBLINGUAL | 0 refills | Status: DC
Start: 2020-08-27 — End: 2020-09-24

## 2020-08-27 MED FILL — BUPRENORPHIN-NALOXON 8-2 MG: 8-2 | 30 days supply | Qty: 60 | Fill #0

## 2020-08-27 NOTE — Patient Instructions (Addendum)
It was nice seeing you today! Thank you for choosing [facility].    Today we talked about:   1. Suboxone prescription for 1 month has been sent to the pharmacy for you. For your next appointment, we can do it via telephone. 2. For your Hepatitis C, I encourage you to make an appointment so we can start working towards treatment. We will need to recheck labs.

## 2020-08-27 NOTE — Assessment & Plan Note (Signed)
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  Given this, patient may be better served to have treatment at an ID or GI clinic, however patient is hesitant on this.  I also offered to recheck labs and to make a decision based on the recheck in Robert Kramer prefer this.  Unfortunately he was unable to have the lab work done today and notes that he will reschedule to have this done.  I did emphasize that it would be imperative to have his hepatitis C treated for his long-term health.   Will go ahead and order the tests now for when patient is rescheduled.  -Liver fibrosis panel -CMP -Patient will reschedule to discuss hepatitis C further

## 2020-08-27 NOTE — Assessment & Plan Note (Signed)
Robert Kramer has been well controlled with his current Suboxone therapy.  His only concerns are that it is difficult to come to [facility] outpatient clinic every 2 weeks for the refills, and would like to have 1 month prescriptions of possible.  He notes that he has obtained health insurance and should be able to get coverage.  He denies any cravings since his last appointment.   -Refilled Suboxone 8-2 mg twice daily -4-week follow-up via telehealth -Tox assure pending

## 2020-08-27 NOTE — Progress Notes (Signed)
    08/27/2020  Robert Kramer presents for follow up of opioid use disorder I have reviewed the prior induction visit, follow up visits, and telephone encounters relevant to opiate use disorder (OUD) treatment.   Current daily dose: 8-2 mg twice daily   Date of Induction:  03/11/20  Current follow up interval, in weeks: 4 weeks  The patient has been adherent with the buprenorphine for OUD contract.   Last UDS Result: 04/24/2020, expected, Carboxy-THC. Repeating today.   HPI: This is a 45 y.o. old male living with OUD who presents for a follow up appointment for OUD.  Patient states well with current dose of Suboxone and is not experiencing any cravings.  On the rare occasion that he Robert Kramer have cravings or think about heroin, he reminds himself about how hard it was to stop and how severe his withdrawals were.  He notes that this always makes him feel better and takes his cravings away.  He is very proud of himself that he has been clean for approximately 6 months.  We also discussed his active hepatitis C.  Robert Kramer noted that he had some difficulty with multiple provider visits given that he lives in [place], [place] and relies on his sister for transportation.  He would prefer to have all his medical treatment done in 1 place.  I did discuss with him that his fibrosis lab work Robert Kramer mean he needs to be treated by a infectious disease or gastroenterology clinic, however we can repeat lab work to make that decision.  Robert Kramer stated he would prefer this, however his sister is waiting for him at this time and he would like to come back to discuss this further.  Exam:   Vitals:   08/27/20 0918  BP: 119/78  Pulse: 70  Temp: 97.9 F (36.6 C)  TempSrc: Oral  SpO2: 100%  Weight: 164 lb 14.4 oz (74.8 kg)   Physical Exam Vitals and nursing note reviewed.  Constitutional:      General: He is not in acute distress.    Appearance: He is normal weight.  HENT:     Head: Normocephalic and  atraumatic.  Eyes:     General: No scleral icterus.    Conjunctiva/sclera: Conjunctivae normal.     Pupils: Pupils are equal, round, and reactive to light.  Cardiovascular:     Rate and Rhythm: Normal rate and regular rhythm.     Heart sounds: No murmur heard.   Pulmonary:     Effort: Pulmonary effort is normal. No respiratory distress.     Breath sounds: Normal breath sounds. No wheezing or rales.  Skin:    General: Skin is warm and dry.  Neurological:     General: No focal deficit present.     Mental Status: He is alert and oriented to person, place, and time. Mental status is at baseline.  Psychiatric:        Mood and Affect: Mood normal.        Behavior: Behavior normal.    Assessment/Plan:  See Problem Based Charting in the Encounters Tab   Dr. Verdene Lennert Internal Medicine PGY-2   08/27/2020, 9:33 AM

## 2020-08-29 NOTE — Progress Notes (Signed)
Internal Medicine Clinic Attending  Case discussed with Dr. Basaraba  At the time of the visit.  We reviewed the resident's history and exam and pertinent patient test results.  I agree with the assessment, diagnosis, and plan of care documented in the resident's note.  

## 2020-08-30 ENCOUNTER — Ambulatory Visit: Payer: 59 | Admitting: Behavioral Health

## 2020-08-30 ENCOUNTER — Other Ambulatory Visit: Payer: Self-pay

## 2020-08-30 DIAGNOSIS — F1120 Opioid dependence, uncomplicated: Secondary | ICD-10-CM

## 2020-08-30 NOTE — BH Specialist Note (Signed)
Integrated Behavioral Health via Telemedicine Visit  08/30/2020 Robert Kramer [id number]  Number of Integrated Behavioral Health visits: 1/6 Session Start time: 11:00am  Session End time: 11:30am Total time: 30  Referring Provider: Dr. Dellia Cloud, MD Patient/Family location: Pt at home in private [facility] Provider location: [facility] Office All persons participating in visit: Pt & Clinician Types of Service: Individual psychotherapy  I connected with [name] and/or Guy Sandifer Spegal's self by Telephone  (Video is Caregility application) and verified that I am speaking with the correct person using two identifiers.Discussed confidentiality: Yes   I discussed the limitations of telemedicine and the availability of in person appointments.  Discussed there is a possibility of technology failure and discussed alternative modes of communication if that failure occurs.  I discussed that engaging in this telemedicine visit, they consent to the provision of behavioral healthcare and the services will be billed under their insurance.  Patient and/or legal guardian expressed understanding and consented to Telemedicine visit: Yes   Presenting Concerns: Patient and/or family reports the following symptoms/concerns: mood swings that include waking up mad, yelling at ppl & being short w/them-he does not understand his own feelings. Duration of problem: for years; Severity of problem: moderate  Patient and/or Family's Strengths/Protective Factors: Concrete supports in place (healthy food, safe environments, etc.) and Sense of purpose  Goals Addressed: Patient will: 1.  Reduce symptoms of: anxiety, depression, insomnia, mood instability and irritability  2.  Increase knowledge and/or ability of: healthy habits and self-management skills  3.  Demonstrate ability to: Increase healthy adjustment to current life circumstances and Begin healthy grieving over loss of Mother in 2021  Progress towards  Goals: Ongoing  Interventions: Interventions utilized:  Grief & Loss Cslg, validation of feelings & normalization of Recovery exp Standardized Assessments completed: Not Needed  Patient and/or Family Response: Pt receptive to session today & future appt for support  Assessment: Patient currently experiencing struggles w/Recovery Support/Relapse Prevention.   Pt given Referral to [facility] for future support. Discussed [employer]-benefits & risks.  Go to [employer] for [employer].  Patient may benefit from cont'd support for job security, reduction in boredom, & plans for activities.  Plan: 1. Follow up with behavioral health clinician on : 3 wks from now for one hour on telehealth or a meet & greet @ next OUD visit 2. Behavioral recommendations: Keep a list of all the things you are doing to stay busy & productive 3. Referral(s): Integrated [employer] (In Clinic) and Future connection to [facility]  I discussed the assessment and treatment plan with the patient and/or parent/guardian. They were provided an opportunity to ask questions and all were answered. They agreed with the plan and demonstrated an understanding of the instructions.   They were advised to call back or seek an in-person evaluation if the symptoms worsen or if the condition fails to improve as anticipated.  Deneise Lever, LMFT

## 2020-09-02 LAB — TOXASSURE SELECT,+ANTIDEPR,UR

## 2020-09-02 NOTE — Progress Notes (Signed)
Toxassure with Suboxone, as expected. However, there was Benzoylegonine. This may be from OTC medication versus secondary to cocaine use. Additionally, UDS positive for THC. Will need to discuss at next appointment in March.

## 2020-09-19 ENCOUNTER — Ambulatory Visit: Payer: 59 | Admitting: Behavioral Health

## 2020-09-19 ENCOUNTER — Other Ambulatory Visit: Payer: Self-pay

## 2020-09-19 DIAGNOSIS — F1120 Opioid dependence, uncomplicated: Secondary | ICD-10-CM

## 2020-09-19 DIAGNOSIS — F319 Bipolar disorder, unspecified: Secondary | ICD-10-CM

## 2020-09-19 NOTE — BH Specialist Note (Signed)
Integrated Behavioral Health via Telemedicine Visit  09/19/2020 Robert Kramer [id number]  Number of Integrated Behavioral Health visits: 2/6 Session Start time: 11:00am  Session End time: 11:30am Total time: 30  Referring Provider: Dr. Dellia Cloud, MD Patient/Family location: Pt is out looking at cars; family transportation was compromised 1-2 wks ago [facility] Provider location: [facility] Office All persons participating in visit: Pt & Clinician (GF is w/him, but he, "tells her everything". Types of Service: Individual psychotherapy  I connected with Robert Kramer and/or Robert Kramer's se;f via  Telephone or Video Enabled Telemedicine Application  (Video is Caregility application) and verified that I am speaking with the correct person using two identifiers. Discussed confidentiality: Yes   I discussed the limitations of telemedicine and the availability of in person appointments.  Discussed there is a possibility of technology failure and discussed alternative modes of communication if that failure occurs.  I discussed that engaging in this telemedicine visit, they consent to the provision of behavioral healthcare and the services will be billed under their insurance.  Patient and/or legal guardian expressed understanding and consented to Telemedicine visit: Yes   Presenting Concerns: Patient and/or family reports the following symptoms/concerns: Pt c/o memory issues & dec'd conc, "crying a lot" due to unexpected death of his Mother in 10-16-2019, & inc'd emotionality/agitation. Duration of problem: months; Severity of problem: moderate. Pt c/o irritability around others  Patient and/or Family's Strengths/Protective Factors: Social connections, Concrete supports in place (healthy food, safe environments, etc.) and Physical Health (exercise, healthy diet, medication compliance, etc.)  Goals Addressed: Patient will: 1.  Reduce symptoms of: anxiety, depression, mood instability and stress  2.   Increase knowledge and/or ability of: coping skills, healthy habits, self-management skills, stress reduction and grief work  3.  Demonstrate ability to: Increase healthy adjustment to current life circumstances, Increase motivation to adhere to plan of care and Begin healthy grieving over loss  Progress towards Goals: Ongoing  Interventions: Interventions utilized:  Solution-Focused Strategies and Supportive Counseling Standardized Assessments completed: screeners as indicated  Patient and/or Family Response: Pt receptive to call today & had to go shop for a car due to lack of any transportation in the home.  Assessment: Patient currently experiencing plateau of Sx of anxiety. Pt exp'g the brain fog that occurs in the first 18 mos of Recovery. His feelings of grief are hitting him, "hard at times". He has secured improvement in [employer] & can now relax about having his Suboxone subscription filled as needed.   Patient may benefit from cont's support for his recovery efforts, & cont'd processing of his grief.  Plan: 1. Follow up with behavioral health clinician on : 2-3 wks telehealth for one hour 2. Behavioral recommendations: Pt had to exit the call abruptly. 3. Referral(s): Integrated [employer] (In Clinic)  I discussed the assessment and treatment plan with the patient and/or parent/guardian. They were provided an opportunity to ask questions and all were answered. They agreed with the plan and demonstrated an understanding of the instructions.   They were advised to call back or seek an in-person evaluation if the symptoms worsen or if the condition fails to improve as anticipated.  Deneise Lever, LMFT

## 2020-09-22 ENCOUNTER — Other Ambulatory Visit: Payer: Self-pay

## 2020-09-22 ENCOUNTER — Emergency Department (HOSPITAL_COMMUNITY)
Admission: EM | Admit: 2020-09-22 | Discharge: 2020-09-22 | Disposition: A | Discharge status: Home / Self Care | Payer: 59 | Attending: Emergency Medicine | Admitting: Emergency Medicine

## 2020-09-22 ENCOUNTER — Encounter (HOSPITAL_COMMUNITY): Payer: Self-pay | Admitting: Emergency Medicine

## 2020-09-22 DIAGNOSIS — R456 Violent behavior: Secondary | ICD-10-CM | POA: Diagnosis not present

## 2020-09-22 DIAGNOSIS — Z0279 Encounter for issue of other medical certificate: Secondary | ICD-10-CM | POA: Diagnosis present

## 2020-09-22 DIAGNOSIS — F17210 Nicotine dependence, cigarettes, uncomplicated: Secondary | ICD-10-CM | POA: Insufficient documentation

## 2020-09-22 DIAGNOSIS — F1994 Other psychoactive substance use, unspecified with psychoactive substance-induced mood disorder: Secondary | ICD-10-CM

## 2020-09-22 LAB — COMPREHENSIVE METABOLIC PANEL
ALT: 38 U/L (ref 0–44)
AST: 60 U/L — ABNORMAL HIGH (ref 15–41)
Albumin: 4.1 g/dL (ref 3.5–5.0)
Alkaline Phosphatase: 73 U/L (ref 38–126)
Anion gap: 9 (ref 5–15)
BUN: 22 mg/dL — ABNORMAL HIGH (ref 6–20)
CO2: 23 mmol/L (ref 22–32)
Calcium: 9.2 mg/dL (ref 8.9–10.3)
Chloride: 104 mmol/L (ref 98–111)
Creatinine, Ser: 0.99 mg/dL (ref 0.61–1.24)
GFR, Estimated: >60 mL/min (ref >60)
Glucose, Bld: 109 mg/dL — ABNORMAL HIGH (ref 70–99)
Potassium: 3.1 mmol/L — ABNORMAL LOW (ref 3.5–5.1)
Sodium: 136 mmol/L (ref 135–145)
Total Bilirubin: 1.9 mg/dL — ABNORMAL HIGH (ref 0.3–1.2)
Total Protein: 8.1 g/dL (ref 6.5–8.1)

## 2020-09-22 LAB — CBC
HCT: 40.7 % (ref 39.0–52.0)
Hemoglobin: 13.6 g/dL (ref 13.0–17.0)
MCH: 30.2 pg (ref 26.0–34.0)
MCHC: 33.4 g/dL (ref 30.0–36.0)
MCV: 90.2 fL (ref 80.0–100.0)
Platelets: 92 10*3/uL — ABNORMAL LOW (ref 150–400)
RBC: 4.51 MIL/uL (ref 4.22–5.81)
RDW: 14 % (ref 11.5–15.5)
WBC: 7.5 10*3/uL (ref 4.0–10.5)
nRBC: 0 % (ref 0.0–0.2)

## 2020-09-22 LAB — ETHANOL: Alcohol, Ethyl (B): <10 mg/dL (ref <10)

## 2020-09-22 MED ORDER — ZIPRASIDONE MESYLATE 20 MG IM SOLR
20.0000 mg | Freq: Once | INTRAMUSCULAR | Status: AC
  Administered 2020-09-22: 20 mg via INTRAMUSCULAR
  Filled 2020-09-22: qty 20

## 2020-09-22 MED ORDER — POTASSIUM CHLORIDE CRYS ER 20 MEQ PO TBCR
40.0000 meq | EXTENDED_RELEASE_TABLET | Freq: Once | ORAL | Status: DC
  Filled 2020-09-22: qty 2

## 2020-09-22 MED ORDER — STERILE WATER FOR INJECTION IJ SOLN
INTRAMUSCULAR | Status: AC
  Administered 2020-09-22: 10 mL
  Filled 2020-09-22: qty 10

## 2020-09-22 NOTE — Discharge Instructions (Signed)
It was our pleasure to provide your ER care today - we hope that you feel better.  Avoid using drugs as they are harmful to your physical and mental well-being. See resource guide provided as relates treatment programs in the community.   Follow up with your primary care doctor and mental health provider in the next 1-2 weeks.   Return to ER if worse, new symptoms, fevers, trouble breathing, or other emergency concern.

## 2020-09-22 NOTE — ED Notes (Signed)
Pt awakens abruptly, states he would like to leave. I ask if he would please let me give him his potassium and a gingerale and he refuses. He then gets up and starts to walk toward lobby door. Dr. Denton Lank notified and states pt may leave AMA as he is not IVC'd. Pt escorted to the lobby with this nurse and Estill Dooms along with public safety. Not able to deescalate pt or convince him to stay. Patient's sister called to come get him and she states she is on her way. He is notified that his sister will pick him up at the front of the hospital. Pt has no personal belongings. Leaves with sweatpants and a hospital gown.

## 2020-09-22 NOTE — ED Triage Notes (Signed)
Pt brought in by [employer] from home. Family called EMS due to pt acting out. Pt admits to using cocaine tonight, and says that the "bitch" gave him a "hot shot". Pt denies SI and HI. Pt seems to be hearing voices but denies when asked.

## 2020-09-22 NOTE — ED Provider Notes (Addendum)
[name] EMERGENCY DEPARTMENT Provider Note   CSN: [id number] Arrival date & time: 09/22/20  9826     History Chief Complaint  Patient presents with  . Medical Clearance    Robert Kramer is a 45 y.o. male.  Patient arrives via [employer] - called by family due to patient acting aggressively. Pt notes was using cocaine last night, and not sure if laced with something. Patient angry/upset about being in ED and handcuffed. Pt denies specific physical complaint. States he is feeling fine, no pain of any sort. No headache. No chest pain or sob. No abd pain or nv. No extremity pain or injury. Pt denies feeling depressed or thoughts of harm to self or others. No SI. Normal appetite. Denies weight loss. Denies other drug use. Pt limited historian, not cooperative at times - level 5 caveat.   The history is provided by the patient and the police. The history is limited by the condition of the patient.       Past Medical History:  Diagnosis Date  . Anxiety   . Bipolar 1 disorder (HCC)   . Bipolar 1 disorder (HCC)   . DDD (degenerative disc disease), lumbar   . Depression   . Drug-seeking behavior   . Hallux valgus with bunions   . Mood swings   . Polysubstance abuse [facility])     Patient Active Problem List   Diagnosis Date Noted  . Pressure injury of skin 03/20/2020  . 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 Hepatitis C virus infection 03/18/2020  . Normocytic anemia 03/18/2020  . Bipolar 1 disorder (HCC)   . Opioid use disorder, severe, dependence (HCC) 04/15/2013    Past Surgical History:  Procedure Laterality Date  . APPLICATION OF ANGIOVAC N/A 03/21/2020   Procedure: APPLICATION OF ANGIOVAC;  Surgeon: Corliss Skains, MD;  Location: [facility] OR;  Service: Vascular;  Laterality: N/A;  . fatty tumor removed from left foot    . SHOULDER ARTHROSCOPY Left 03/15/2020   Procedure: ARTHROSCOPY SHOULDER, IRRIGATION AND  DEBRIDEMENT OF SHOULDER BURSECTOMY;  Surgeon: Teryl Lucy, MD;  Location: [facility] OR;  Service: Orthopedics;  Laterality: Left;  . TEE WITHOUT CARDIOVERSION N/A 03/15/2020   Procedure: TRANSESOPHAGEAL ECHOCARDIOGRAM (TEE);  Surgeon: Jake Bathe, MD;  Location: [facility] OR;  Service: Cardiovascular;  Laterality: N/A;       Family History  Problem Relation Age of Onset  . Cancer Father     Social History   Tobacco Use  . Smoking status: Current Every Day Smoker    Packs/day: 0.50    Years: 5.00    Pack years: 2.50    Types: Cigarettes  . Smokeless tobacco: Never Used  . Tobacco comment: cutting back  Substance Use Topics  . Alcohol use: Not Currently    Alcohol/week: 1.0 standard drink    Types: 1 Cans of beer per week    Comment: occ  . Drug use: No    Types: Cocaine, Marijuana    Comment: opiates - quit 2017    Home Medications Prior to Admission medications   Medication Sig Start Date End Date Taking? Authorizing Provider  buprenorphine-naloxone (SUBOXONE) 8-2 mg SUBL SL tablet Place 1 tablet under the tongue in the morning and at bedtime. 08/27/20   Tyson Alias, MD    Allergies    Tramadol  Review of Systems   Review of Systems  Constitutional: Negative for fever.  HENT: Negative for sore throat.  Eyes: Negative for pain.  Respiratory: Negative for cough and shortness of breath.   Cardiovascular: Negative for chest pain.  Gastrointestinal: Negative for abdominal pain, diarrhea and vomiting.  Genitourinary: Negative for flank pain.  Musculoskeletal: Negative for back pain and neck pain.  Skin: Negative for rash.  Neurological: Negative for headaches.  Hematological: Does not bruise/bleed easily.  Psychiatric/Behavioral: Negative for suicidal ideas.    Physical Exam Updated Vital Signs Ht 1.803 m (5\' 11" )   Wt 75 kg   BMI 23.06 kg/m   Physical Exam Vitals and nursing note reviewed.  Constitutional:      Appearance: Normal appearance. He is  well-developed.  HENT:     Head: Atraumatic.     Nose: Nose normal.     Mouth/Throat:     Mouth: Mucous membranes are moist.     Pharynx: Oropharynx is clear.  Eyes:     General: No scleral icterus.    Conjunctiva/sclera: Conjunctivae normal.     Pupils: Pupils are equal, round, and reactive to light.  Neck:     Trachea: No tracheal deviation.  Cardiovascular:     Rate and Rhythm: Normal rate and regular rhythm.     Pulses: Normal pulses.     Heart sounds: Normal heart sounds. No murmur heard. No friction rub. No gallop.   Pulmonary:     Effort: Pulmonary effort is normal. No accessory muscle usage or respiratory distress.     Breath sounds: Normal breath sounds.  Abdominal:     General: Bowel sounds are normal. There is no distension.     Palpations: Abdomen is soft.     Tenderness: There is no abdominal tenderness.  Genitourinary:    Comments: No cva tenderness. Musculoskeletal:        General: No swelling or tenderness.     Cervical back: Normal range of motion and neck supple. No rigidity.  Skin:    General: Skin is warm and dry.     Findings: No rash.  Neurological:     Mental Status: He is alert.     Comments: Alert, speech clear. Motor/sens grossly intact bil. Steady gait.   Psychiatric:     Comments: Expresses anger/frustration about being handcuffed by . Denies SI.      ED Results / Procedures / Treatments   Labs (all labs ordered are listed, but only abnormal results are displayed) Results for orders placed or performed during the hospital encounter of 09/22/20  CBC  Result Value Ref Range   WBC 7.5 4.0 - 10.5 K/uL   RBC 4.51 4.22 - 5.81 MIL/uL   Hemoglobin 13.6 13.0 - 17.0 g/dL   HCT 09/24/20 78.6 - 75.4 %   MCV 90.2 80.0 - 100.0 fL   MCH 30.2 26.0 - 34.0 pg   MCHC 33.4 30.0 - 36.0 g/dL   RDW 49.2 01.0 - 07.1 %   Platelets 92 (L) 150 - 400 K/uL   nRBC 0.0 0.0 - 0.2 %  Comprehensive metabolic panel  Result Value Ref Range   Sodium 136  135 - 145 mmol/L   Potassium 3.1 (L) 3.5 - 5.1 mmol/L   Chloride 104 98 - 111 mmol/L   CO2 23 22 - 32 mmol/L   Glucose, Bld 109 (H) 70 - 99 mg/dL   BUN 22 (H) 6 - 20 mg/dL   Creatinine, Ser 21.9 0.61 - 1.24 mg/dL   Calcium 9.2 8.9 - 7.58 mg/dL   Total Protein 8.1 6.5 - 8.1 g/dL  Albumin 4.1 3.5 - 5.0 g/dL   AST 60 (H) 15 - 41 U/L   ALT 38 0 - 44 U/L   Alkaline Phosphatase 73 38 - 126 U/L   Total Bilirubin 1.9 (H) 0.3 - 1.2 mg/dL   GFR, Estimated >50 >09 mL/min   Anion gap 9 5 - 15  Ethanol  Result Value Ref Range   Alcohol, Ethyl (B) <10 <10 mg/dL    EKG None  Radiology No results found.  Procedures Procedures   Medications Ordered in ED Medications  ziprasidone (GEODON) injection 20 mg (20 mg Intramuscular Given 09/22/20 0714)  sterile water (preservative free) injection (10 mLs  Given 09/22/20 3818)    ED Course  I have reviewed the triage vital signs and the nursing notes.  Pertinent labs & imaging results that were available during my care of the patient were reviewed by me and considered in my medical decision making (see chart for details).    MDM Rules/Calculators/A&P                          Labs sent.   Reviewed nursing notes and prior charts for additional history.   Labs reviewed/interpreted by me - chem normal except k mildly low. kcl po. Po fluids/food.   Recheck pt, calm and alert. Pt denies any acute physical or behavioral c/o. Patient is requesting discharge from ED.   Pt currently appears stable for d/c.   Rec outpt f/u with pcp and behavioral health provider, avoidance of substance use/abuse - resource guide provided.      Final Clinical Impression(s) / ED Diagnoses Final diagnoses:  None    Rx / DC Orders ED Discharge Orders    None         Cathren Laine, MD 09/22/20 1110

## 2020-09-22 NOTE — ED Notes (Signed)
Pt cuffed to bed, law enforcement at bedside

## 2020-09-24 ENCOUNTER — Ambulatory Visit (INDEPENDENT_AMBULATORY_CARE_PROVIDER_SITE_OTHER): Payer: 59 | Admitting: Internal Medicine

## 2020-09-24 ENCOUNTER — Encounter: Payer: Self-pay | Admitting: Internal Medicine

## 2020-09-24 ENCOUNTER — Other Ambulatory Visit: Payer: Self-pay | Admitting: Internal Medicine

## 2020-09-24 ENCOUNTER — Other Ambulatory Visit: Payer: Self-pay

## 2020-09-24 DIAGNOSIS — F1120 Opioid dependence, uncomplicated: Secondary | ICD-10-CM

## 2020-09-24 MED ORDER — BUPRENORPHINE HCL-NALOXONE HCL 8-2 MG SL SUBL: 1.0000 | SUBLINGUAL_TABLET | Freq: Two times a day (BID) | SUBLINGUAL | 0 refills | Status: DC

## 2020-09-24 MED FILL — BUPRENORPHIN-NALOXON 8-2 MG: 8-2 | 30 days supply | Qty: 60 | Fill #0

## 2020-09-24 NOTE — Progress Notes (Signed)
  [facility] Internal Medicine Residency Telephone Encounter Continuity Care Appointment  HPI:   This telephone encounter was created for Mr. Robert Kramer on 09/24/2020 for the following purpose/cc OUD Follow Up .   Past Medical History:  Past Medical History:  Diagnosis Date  . Anxiety   . Bipolar 1 disorder (HCC)   . Bipolar 1 disorder (HCC)   . DDD (degenerative disc disease), lumbar   . Depression   . Drug-seeking behavior   . Hallux valgus with bunions   . Mood swings   . Polysubstance abuse (HCC)       ROS:  Review of Systems  Constitutional: Negative for chills, diaphoresis and fever.  Gastrointestinal: Negative for abdominal pain, constipation, diarrhea, nausea and vomiting.  Musculoskeletal: Negative for myalgias.     Assessment / Plan / Recommendations:   Please see A&P under problem oriented charting for assessment of the patient's acute and chronic medical conditions.   As always, pt is advised that if symptoms worsen or new symptoms arise, they should go to an urgent care facility or to to ER for further evaluation.   Consent and Medical Decision Making:   Patient discussed with Dr. Criselda Peaches  This is a telephone encounter between [name] and Dolan Amen on 09/24/2020 for Opioid Use Disorder. The visit was conducted with the patient located at home and Dolan Amen at [facility]. The patient's identity was confirmed using their DOB and current address. The patient has consented to being evaluated through a telephone encounter and understands the associated risks (an examination cannot be done and the patient may need to come in for an appointment) / benefits (allows the patient to remain at home, decreasing exposure to coronavirus). I personally spent 13 minutes on medical discussion.

## 2020-09-24 NOTE — Assessment & Plan Note (Addendum)
09/24/2020  [name] presents for follow up of opioid use disorder I have reviewed the prior induction visit, follow up visits, and telephone encounters relevant to opiate use disorder (OUD) treatment.   Current daily dose: 8-2 mg twice daily   Date of Induction: 03/11/20  Current follow up interval, in weeks: 4 weeks   The patient has been adherent with the buprenorphine for OUD contract.   Last UDS Results: Unexpected: Carboxy-THC and Benzoylecgonine.  HPI:  Patient presents for a telehealth visit for follow up on his OUD. He states that he is compliant with his suboxone regimen. He has not had any cravings or relapses concerning opioids, but does admit that he snorting cocaine several days ago. He also endorsed marijuana cessation.   A/P:  Patient compliant with his current regimen of Suboxone 8-2 mg twice daily. He has not relapsed or had cravings for opioids. Discussed need to stop using cocaine. PDMP reviewed and appropriate. Will refill his Suboxone today.  - Refill suboxone 8-2 mg tab twice daily - FU in 4 weeks

## 2020-10-01 NOTE — Progress Notes (Signed)
Internal Medicine Clinic Attending  Case discussed with Dr. Winters  At the time of the visit.  We reviewed the resident's history and pertinent patient test results.  I agree with the assessment, diagnosis, and plan of care documented in the resident's note.  

## 2020-10-05 ENCOUNTER — Other Ambulatory Visit (HOSPITAL_COMMUNITY): Payer: Self-pay

## 2020-10-15 ENCOUNTER — Ambulatory Visit: Payer: 59 | Admitting: Behavioral Health

## 2020-10-15 ENCOUNTER — Other Ambulatory Visit: Payer: Self-pay

## 2020-10-15 DIAGNOSIS — R454 Irritability and anger: Secondary | ICD-10-CM

## 2020-10-15 DIAGNOSIS — F319 Bipolar disorder, unspecified: Secondary | ICD-10-CM

## 2020-10-15 DIAGNOSIS — F1120 Opioid dependence, uncomplicated: Secondary | ICD-10-CM

## 2020-10-15 NOTE — BH Specialist Note (Signed)
Integrated Behavioral Health via Telemedicine Visit  10/15/2020 Robert Kramer [id number]  Number of Integrated Behavioral Health visits: 3/6 Session Start time: 9:00am  Session End time: 9:30am Total time: 30  Referring Provider: Dr. Orinda Kenner, MD Patient/Family location: Pt at home in private [facility] Provider location: [place] Office All persons participating in visit: Pt & Clinician Types of Service: Health Promotion and Recovery Support/Relapse Prevention  I connected with Robert Kramer and/or Robert Kramer's self via  Telephone or Video Enabled Telemedicine Application  (Video is Caregility application) and verified that I am speaking with the correct person using two identifiers. Discussed confidentiality: Yes   I discussed the limitations of telemedicine and the availability of in person appointments.  Discussed there is a possibility of technology failure and discussed alternative modes of communication if that failure occurs.  I discussed that engaging in this telemedicine visit, they consent to the provision of behavioral healthcare and the services will be billed under their insurance.  Patient and/or legal guardian expressed understanding and consented to Telemedicine visit: Yes   Presenting Concerns: Patient and/or family reports the following symptoms/concerns: elevated anx/dep, grief over death of Mother, & recent relapse resulting in trip to the ED where Physician reported  Duration of problem: over one year of elevated emotionality due to Mother's death & initiation of Recovery Support/Relapse Prevention efforts; Severity of problem: moderate to severe  Patient and/or Family's Strengths/Protective Factors: Social connections and Concrete supports in place (healthy food, safe environments, etc.), supportive Family system, Pt able to advocate for himself re: unfair Tx by [employer]  Goals Addressed: Patient will: 1.  Reduce symptoms of: agitation, anxiety and depression   2.  Increase knowledge and/or ability of: coping skills, healthy habits, self-management skills, stress reduction and dec-mkg skills  3.  Demonstrate ability to: Increase healthy adjustment to current life circumstances, Increase motivation to adhere to plan of care, Decrease self-medicating behaviors and Begin healthy grieving over loss  Progress towards Goals: Revised and Pt instructed to comply w/Suboxone Medication Agreement  Interventions: Interventions utilized:  Motivational Interviewing, Medication Monitoring and Supportive Counseling Standardized Assessments completed: Not Needed  Patient and/or Family Response: Pt was receptive to visit today & confronted by Clinician over recent visit to the ED as a result of recreational cocaine use at a party. Pt had adverse rxn to a reported, "2 lines of cocaine laced w/meth". Pt was surprised @ his exp'g feelings of having a heart attack & Law Enforcement treating him so badly. Discussed Pt Recovery process & how use after cessation can result in exacerbated rxn to illicit drugs.  Assessment: Patient currently experiencing high agitation during visit today as we reviewed what happened. Explained to Pt that it is common to have setbacks (relapses) & he needs to have a plan to address relapses when they happen. The first step is to accept/acknowledge you made a bad dec & address it w/your plan for when relapse occurs.   Patient may benefit from efforts to support Pt Recovery process, manage his anger, & improve dec-mkg. Address grief over Mother's death & explore the relationship as he views it now.  Plan: 1. Follow up with behavioral health clinician on : 2-3 wks on telehealth for 60 min 2. Behavioral recommendations: Fllw through w/verbal agreement w/Clinician, keep next scheduled session, keep yourself busy & away from situations where you are vulnerable to relapse. Record other circumstances where you might be challenged to  relapse. 3. Referral(s): Integrated [employer] (In Clinic) and  other NA/AA resources offered.  I discussed the assessment and treatment plan with the patient and/or parent/guardian. They were provided an opportunity to ask questions and all were answered. They agreed with the plan and demonstrated an understanding of the instructions.   They were advised to call back or seek an in-person evaluation if the symptoms worsen or if the condition fails to improve as anticipated.  Deneise Lever, LMFT

## 2020-10-21 ENCOUNTER — Other Ambulatory Visit (HOSPITAL_COMMUNITY): Payer: Self-pay

## 2020-10-22 ENCOUNTER — Ambulatory Visit (INDEPENDENT_AMBULATORY_CARE_PROVIDER_SITE_OTHER): Payer: 59 | Admitting: Student in an Organized Health Care Education/Training Program

## 2020-10-22 ENCOUNTER — Other Ambulatory Visit: Payer: Self-pay

## 2020-10-22 ENCOUNTER — Other Ambulatory Visit: Payer: Self-pay | Admitting: *Deleted

## 2020-10-22 ENCOUNTER — Other Ambulatory Visit (HOSPITAL_COMMUNITY): Payer: Self-pay

## 2020-10-22 VITALS — BP 119/74 | HR 70 | Temp 97.8°F | Ht 71.0 in | Wt 156.3 lb

## 2020-10-22 DIAGNOSIS — F1120 Opioid dependence, uncomplicated: Secondary | ICD-10-CM

## 2020-10-22 DIAGNOSIS — B182 Chronic viral hepatitis C: Secondary | ICD-10-CM

## 2020-10-22 MED ORDER — BUPRENORPHINE HCL-NALOXONE HCL 8-2 MG SL SUBL
1.0000 | SUBLINGUAL_TABLET | Freq: Two times a day (BID) | SUBLINGUAL | 1 refills | Status: DC
  Filled 2020-10-22 (×2): qty 60, 30d supply, fill #0

## 2020-10-22 MED ORDER — BUPRENORPHINE HCL-NALOXONE HCL 8-2 MG SL SUBL
1.0000 | SUBLINGUAL_TABLET | Freq: Two times a day (BID) | SUBLINGUAL | 1 refills | Status: DC
  Filled 2020-10-22 – 2020-11-19 (×2): qty 60, 30d supply, fill #0

## 2020-10-22 NOTE — Assessment & Plan Note (Signed)
OUD is doing well on current treatment. Plan to continue with Suboxone 8mg  tablet twice daily. He now has insurance which should help access. I sent a two month supply. We will check a Urine tox today. Follow up with in 2 months.

## 2020-10-22 NOTE — Patient Instructions (Signed)
For Treatment of Hepatitis C:  Call  [facility] for Infectious Disease  [phone]

## 2020-10-22 NOTE — Progress Notes (Signed)
   Assessment and Plan:  See Encounters tab for problem-based medical decision making.   __________________________________________________________  HPI:   45 year old person here for follow up of OUD. He is doing very well on current medication, good adherence and access to suboxone 8mg  twice daily for many months. He inhaled cocaine about 2 weeks ago at a party, it was impulsive, he had a bad reaction and was monitored overnight in an ED. He is set to start a new job in [place], lives in [place], has a fiancee, reports keeping suboxone in a lock box. No fevers, chills, constipation, or dental issues.   __________________________________________________________  Problem List: Patient Active Problem List   Diagnosis Date Noted  . Hepatitis C virus infection 03/18/2020    Priority: High  . Opioid use disorder, severe, dependence (HCC) 04/15/2013    Priority: High  . Pressure injury of skin 03/20/2020  . 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 Normocytic anemia 03/18/2020  . Bipolar 1 disorder (HCC)     Medications: Reconciled today in Epic __________________________________________________________  Physical Exam:  Vital Signs: Vitals:   10/22/20 0941  BP: 119/74  Pulse: 70  Temp: 97.8 F (36.6 C)  TempSrc: Oral  SpO2: 99%  Weight: 156 lb 4.8 oz (70.9 kg)  Height: 5\' 11"  (1.803 m)    Gen: Well appearing, NAD Neck: No cervical LAD, No thyromegaly or nodules CV: RRR, no murmurs Ext: Warm, no edema, normal joints

## 2020-10-22 NOTE — Telephone Encounter (Signed)
Ok, but patient told me he has insurance now. I will resend, maybe its not active yet?

## 2020-10-22 NOTE — Assessment & Plan Note (Signed)
Chronic hepatitis C that has not yet been treated due to difficulty accessing medication. He has estimated advanced fibrosis at [facility], but no overt signs of cirrhosis on exam. He was evaluated by [facility] in November, he now has insurance which should help with access. I gave him the phone number for [facility] and asked him to call them to access antiviral treatment.

## 2020-10-22 NOTE — Telephone Encounter (Signed)
Call from [facility] - requesting doctor to re-send suboxone rx with "[facility]". Thanks

## 2020-10-29 LAB — TOXASSURE SELECT,+ANTIDEPR,UR

## 2020-11-06 ENCOUNTER — Ambulatory Visit: Payer: 59 | Admitting: Behavioral Health

## 2020-11-06 ENCOUNTER — Other Ambulatory Visit: Payer: Self-pay

## 2020-11-06 DIAGNOSIS — F331 Major depressive disorder, recurrent, moderate: Secondary | ICD-10-CM

## 2020-11-06 DIAGNOSIS — F419 Anxiety disorder, unspecified: Secondary | ICD-10-CM

## 2020-11-06 DIAGNOSIS — F319 Bipolar disorder, unspecified: Secondary | ICD-10-CM

## 2020-11-06 NOTE — BH Specialist Note (Signed)
Integrated Behavioral Health via Telemedicine Visit  11/06/2020 Robert Kramer [id number]  Number of Integrated Behavioral Health visits: 4/6 Session Start time: 10:00am  Session End time: 10:30am Total time: 30  Referring Provider: Dr. Orinda Kenner, MD Patient/Family location: Pt in bedroom at home in private on Str's phone [facility] Provider location: Working remotely in private All persons participating in visit: Pt & Clinician Types of Service: Individual psychotherapy  I connected with [name] and/or Guy Sandifer Whidbee's self via  Telephone or Video Enabled Telemedicine Application  (Video is Caregility application) and verified that I am speaking with the correct person using two identifiers. Discussed confidentiality: Yes   I discussed the limitations of telemedicine and the availability of in person appointments.  Discussed there is a possibility of technology failure and discussed alternative modes of communication if that failure occurs.  I discussed that engaging in this telemedicine visit, they consent to the provision of behavioral healthcare and the services will be billed under their insurance.  Patient and/or legal guardian expressed understanding and consented to Telemedicine visit: Yes   Presenting Concerns: Patient and/or family reports the following symptoms/concerns: Pt has reduced anx/dep since last visit Duration of problem: months to yrs; Severity of problem: mild to moderate intensity  Patient and/or Family's Strengths/Protective Factors: Social connections, Concrete supports in place (healthy food, safe environments, etc.) and Pt attempting to stay in supportive Recovery & Relapse Prevention efforts; Pt has been compliant w/verbal agreement w/Clinician to stick w/Suboxone Contract signed in [facility]  Goals Addressed: Patient will: 1.  Reduce symptoms of: anxiety, depression, mood instability and stress  2.  Increase knowledge and/or ability of: coping skills,  healthy habits and stress reduction  3.  Demonstrate ability to: Increase healthy adjustment to current life circumstances, Increase adequate support systems for patient/family, Increase motivation to adhere to plan of care and Improve medication compliance  Progress towards Goals: Ongoing  Interventions: Interventions utilized:  Motivational Interviewing, Solution-Focused Strategies, Medication Monitoring and Supportive Counseling Standardized Assessments completed: Not Needed  Patient and/or Family Response: Pt receptive to call today w/reduced incident of anger  Assessment: Patient currently experiencing reduced anger rxn & improved response to grief over death of Mother in 10-29-2019. Pt understands better the response he exp'd as a result of recent relapse. Pt acknowledged his responsibility in the process of this relapse & sts he has kept verbal agreement w/Clinician to prevent Subst use based on suggestions, tools, & resources provided.  Patient may benefit from cont'd check-in telehealth calls to address mental health needs, provide psychoedu about Subst use & to promote Pt's efforts for employment & self-regulation of feelings.  Plan: 1. Follow up with behavioral health clinician on : Next avail 30 min telehealth check-in in May 2. Behavioral recommendations: Journal feelings, concerns, & important notes for focus in our next session. 3. Referral(s): Integrated [employer] (In Clinic)  I discussed the assessment and treatment plan with the patient and/or parent/guardian. They were provided an opportunity to ask questions and all were answered. They agreed with the plan and demonstrated an understanding of the instructions.   They were advised to call back or seek an in-person evaluation if the symptoms worsen or if the condition fails to improve as anticipated.  Deneise Lever, LMFT

## 2020-11-15 ENCOUNTER — Encounter: Payer: Self-pay | Admitting: Student

## 2020-11-15 ENCOUNTER — Other Ambulatory Visit (HOSPITAL_COMMUNITY): Payer: Self-pay

## 2020-11-18 ENCOUNTER — Other Ambulatory Visit (HOSPITAL_COMMUNITY): Payer: Self-pay

## 2020-11-19 ENCOUNTER — Other Ambulatory Visit (HOSPITAL_COMMUNITY): Payer: Self-pay

## 2020-11-28 ENCOUNTER — Other Ambulatory Visit: Payer: Self-pay

## 2020-11-28 ENCOUNTER — Ambulatory Visit: Payer: 59 | Admitting: Behavioral Health

## 2020-11-28 ENCOUNTER — Telehealth: Payer: Self-pay | Admitting: Behavioral Health

## 2020-11-28 NOTE — Telephone Encounter (Signed)
Contacted Pt twice on mobile @ [phone]. Unable to lv msg for Pt due to mailbox being full. Attempted to call home number listed. Str Helene Kelp answered & reports Pt & GF moved out a few days ago after a verbal conflict.  Dr. Monna Fam

## 2020-12-16 ENCOUNTER — Other Ambulatory Visit (HOSPITAL_COMMUNITY): Payer: Self-pay

## 2020-12-16 ENCOUNTER — Other Ambulatory Visit: Payer: Self-pay

## 2020-12-16 DIAGNOSIS — F1120 Opioid dependence, uncomplicated: Secondary | ICD-10-CM

## 2020-12-16 MED ORDER — BUPRENORPHINE HCL-NALOXONE HCL 8-2 MG SL SUBL
1.0000 | SUBLINGUAL_TABLET | Freq: Two times a day (BID) | SUBLINGUAL | 1 refills | Status: DC
  Filled 2020-12-16: qty 60, 30d supply, fill #0
  Filled 2020-12-16 – 2020-12-17 (×2): qty 28, 14d supply, fill #0
  Filled 2021-01-03: qty 28, 14d supply, fill #1

## 2020-12-16 NOTE — Telephone Encounter (Signed)
  buprenorphine-naloxone (SUBOXONE) 8-2 mg SUBL SL tablet, REFILL REQUEST @  [facility] Phone:  [phone]  Fax:  [phone]      

## 2020-12-16 NOTE — Telephone Encounter (Signed)
Next appt scheduled 6/21 in OUD clinic.

## 2020-12-17 ENCOUNTER — Other Ambulatory Visit (HOSPITAL_COMMUNITY): Payer: Self-pay

## 2020-12-24 ENCOUNTER — Ambulatory Visit (INDEPENDENT_AMBULATORY_CARE_PROVIDER_SITE_OTHER): Payer: 59 | Admitting: Internal Medicine

## 2020-12-24 DIAGNOSIS — B182 Chronic viral hepatitis C: Secondary | ICD-10-CM

## 2020-12-24 DIAGNOSIS — F1120 Opioid dependence, uncomplicated: Secondary | ICD-10-CM | POA: Diagnosis not present

## 2020-12-24 NOTE — Assessment & Plan Note (Signed)
He has not been able to follow up with [facility] yet due to insurance issues.  I advised him to get his insurance sorted out and then schedule his follow up.  We may need to have him come in person to discern exactly what the issue is.  Possibly a community based Child psychotherapist would be able to be of assistance.  He will need fibrosure and treatment of his HCV.  He denies any abdominal pain or signs/symptoms of hepatitis today.

## 2020-12-24 NOTE — Progress Notes (Signed)
  [facility] Telephone Encounter Continuity Care Appointment  HPI:  This telephone encounter was created for Mr. Robert Kramer on 12/24/2020 for the following purpose/cc follow up on OUD.    12/24/2020  Robert Kramer presents for follow up of opioid use disorder I have reviewed the prior induction visit, follow up visits, and telephone encounters relevant to opiate use disorder (OUD) treatment.   Current daily dose: Buprenorphine-naloxone 8-2mg  BID  Date of Induction: 03/31/20  Current follow up interval, in weeks: 4 weeks  The patient has been adherent with the buprenorphine for OUD contract.   Last UDS Result: + for cocaine.  Discussed in April and today.   HPI: Robert Kramer reports that he is doing well.  He has no relapses to report and no cravings.  He has started a new job at [employer] and this is his second week on the job.  He feels that the stability of the job will help him with his issues with drug use.  He has had no use of cocaine since last visit and is getting drug tested for work.  He has had issues with getting his Rx filled at times, and he notes that his insurance has "run out."  He is going to call medicaid and see what can be done about that.  He notes that he has not had time to follow up with [facility] yet b/c of the insurance issue.    Assessment/Plan:  See Problem Based Charting in the Encounters Tab    Inez Catalina, MD  12/24/2020  9:04 AM    Past Medical History:  Past Medical History:  Diagnosis Date   Anxiety    Bipolar 1 disorder (HCC)    Bipolar 1 disorder (HCC)    DDD (degenerative disc disease), lumbar    Depression    Drug-seeking behavior    Hallux valgus with bunions    Mood swings    Polysubstance abuse (HCC)      ROS:  No cravings, relapse, sweating   Assessment / Plan / Recommendations:  Please see A&P under problem oriented charting for assessment of the patient's acute and chronic medical conditions.  As always,  pt is advised that if symptoms worsen or new symptoms arise, they should go to an urgent care facility or to to ER for further evaluation.   Consent and Medical Decision Making:  This is a telephone encounter between Robert Kramer and Debe Coder on 12/24/2020 for follow up of opioid use disorder. The visit was conducted with the patient located at home and Debe Coder at [facility]. The patient's identity was confirmed using their DOB and current address. The patient has consented to being evaluated through a telephone encounter and understands the associated risks (an examination cannot be done and the patient may need to come in for an appointment) / benefits (allows the patient to remain at home, decreasing exposure to coronavirus). I personally spent 15 minutes on medical discussion.

## 2020-12-24 NOTE — Addendum Note (Signed)
Addended by: Debe Coder B on: 12/24/2020 10:16 AM   Modules accepted: Orders

## 2020-12-24 NOTE — Assessment & Plan Note (Addendum)
He reports doing well.  He has had issues with his insurance and he was not able to get his Rx filled for a 1 month supply last time.  Given his insurance did not go through, he was only dispensed a 2 week supply.  He has that on hand now.  He is going to call his insurance company and the pharmacy to get more details.  Overall, he is doing well as concerns his OUD.  He has had no relapse, no cravings and he is taking the buprenorphine well.  We will continue his dose at current levels.  We would like to see him in 4 weeks for an in person visit and also to address any further insurance concerns he may have.  PDMP reviewed and appropriate.

## 2020-12-30 ENCOUNTER — Other Ambulatory Visit (HOSPITAL_COMMUNITY): Payer: Self-pay

## 2021-01-03 ENCOUNTER — Other Ambulatory Visit (HOSPITAL_COMMUNITY): Payer: Self-pay

## 2021-01-24 ENCOUNTER — Other Ambulatory Visit (HOSPITAL_COMMUNITY): Payer: Self-pay

## 2021-03-25 ENCOUNTER — Ambulatory Visit (INDEPENDENT_AMBULATORY_CARE_PROVIDER_SITE_OTHER): Payer: 59 | Admitting: Internal Medicine

## 2021-03-25 ENCOUNTER — Other Ambulatory Visit (HOSPITAL_COMMUNITY): Payer: Self-pay

## 2021-03-25 VITALS — BP 141/86 | HR 73 | Temp 97.9°F | Wt 160.0 lb

## 2021-03-25 DIAGNOSIS — F1120 Opioid dependence, uncomplicated: Secondary | ICD-10-CM

## 2021-03-25 MED ORDER — BUPRENORPHINE HCL-NALOXONE HCL 8-2 MG SL FILM
ORAL_FILM | SUBLINGUAL | 0 refills | Status: DC
  Filled 2021-03-25: qty 35, 14d supply, fill #0

## 2021-03-25 MED ORDER — BUPRENORPHINE HCL-NALOXONE HCL 8-2 MG SL SUBL
SUBLINGUAL_TABLET | SUBLINGUAL | 0 refills | Status: DC
  Filled 2021-03-25: qty 35, 14d supply, fill #0

## 2021-03-25 NOTE — Assessment & Plan Note (Signed)
Suboxone Follow Up Currently on suboxone 8-2mg  BID--taking one in the morning and one in the early afternoon. He notes a preference to films over tablets.  He reports using heroin one week ago. He developed withdrawal symptoms with last suboxone use being the day prior. He feels as though suboxone wears off in the evening with increased cravings. He admits to occassional marijuana use.  Reviewed last UDS 10/2020.  PDMP reviewed and appropriate.  Last OV 12/2020 note reviewed. Update since then: he has reacquired insurance. Plan  Increase suboxone to 2.5 films daily--one in the morning, one in the afternoon, and half film in the evening, in hopes to curve cravings as noted above.   UDS today  Follow up in 2 weeks to re-evaluate cravings

## 2021-03-25 NOTE — Progress Notes (Signed)
   Office Visit   Patient ID: Robert Kramer, male    DOB: [birth year], 45 y.o.   MRN: [id number]   PCP: Belva Agee, MD   Subjective:  Robert Kramer is a 45 y.o. year old male who presents for follow up of OUD. Please refer to problem based charting for assessment and plan.    Objective:   BP (!) 141/86 (BP Location: Right Arm, Patient Position: Sitting, Cuff Size: Normal)   Pulse 73   Temp 97.9 F (36.6 C) (Oral)   Wt 160 lb (72.6 kg)   SpO2 99%   BMI 22.32 kg/m  BP Readings from Last 3 Encounters:  03/25/21 (!) 141/86  10/22/20 119/74  09/22/20 (!) 112/56   General: well appearing Psych: normal affect. Appropriate eye contact. Normal behavior Assessment & Plan:   Problem List Items Addressed This Visit       Other   Opioid use disorder, severe, dependence (HCC) - Primary (Chronic)    Suboxone Follow Up Currently on suboxone 8-2mg  BID--taking one in the morning and one in the early afternoon. He notes a preference to films over tablets.  He reports using heroin one week ago. He developed withdrawal symptoms with last suboxone use being the day prior. He feels as though suboxone wears off in the evening with increased cravings. He admits to occassional marijuana use.  Reviewed last UDS 10/2020.  PDMP reviewed and appropriate.  Last OV 12/2020 note reviewed. Update since then: he has reacquired insurance. Plan Increase suboxone to 2.5 films daily--one in the morning, one in the afternoon, and half film in the evening, in hopes to curve cravings as noted above.  UDS today Follow up in 2 weeks to re-evaluate cravings       Relevant Medications   buprenorphine-naloxone (SUBOXONE) 8-2 mg SUBL SL tablet   Other Relevant Orders   Tox[employer]ure Select,+Antidepr,UR     Return in about 2 weeks (around 04/08/2021).   Pt discussed with Dr. Leo Rod, MD Internal Medicine Resident PGY-3 [facility] 03/25/2021 1:53 PM

## 2021-03-28 NOTE — Progress Notes (Signed)
Internal Medicine Clinic Attending  Case discussed with Dr. Christian  At the time of the visit.  We reviewed the resident's history and exam and pertinent patient test results.  I agree with the assessment, diagnosis, and plan of care documented in the resident's note.  

## 2021-03-30 LAB — TOXASSURE SELECT,+ANTIDEPR,UR

## 2021-04-08 ENCOUNTER — Encounter: Payer: 59 | Admitting: Internal Medicine

## 2023-05-19 DIAGNOSIS — F17201 Nicotine dependence, unspecified, in remission: Secondary | ICD-10-CM | POA: Diagnosis not present

## 2023-05-19 DIAGNOSIS — F1120 Opioid dependence, uncomplicated: Secondary | ICD-10-CM | POA: Diagnosis not present

## 2023-05-26 DIAGNOSIS — F1120 Opioid dependence, uncomplicated: Secondary | ICD-10-CM | POA: Diagnosis not present

## 2023-05-26 DIAGNOSIS — B182 Chronic viral hepatitis C: Secondary | ICD-10-CM | POA: Diagnosis not present

## 2023-05-26 DIAGNOSIS — F411 Generalized anxiety disorder: Secondary | ICD-10-CM | POA: Diagnosis not present

## 2023-05-26 DIAGNOSIS — F17201 Nicotine dependence, unspecified, in remission: Secondary | ICD-10-CM | POA: Diagnosis not present

## 2023-06-02 DIAGNOSIS — F17201 Nicotine dependence, unspecified, in remission: Secondary | ICD-10-CM | POA: Diagnosis not present

## 2023-06-02 DIAGNOSIS — F411 Generalized anxiety disorder: Secondary | ICD-10-CM | POA: Diagnosis not present

## 2023-06-02 DIAGNOSIS — B182 Chronic viral hepatitis C: Secondary | ICD-10-CM | POA: Diagnosis not present

## 2023-06-02 DIAGNOSIS — F1120 Opioid dependence, uncomplicated: Secondary | ICD-10-CM | POA: Diagnosis not present

## 2023-06-09 DIAGNOSIS — F17201 Nicotine dependence, unspecified, in remission: Secondary | ICD-10-CM | POA: Diagnosis not present

## 2023-06-09 DIAGNOSIS — B182 Chronic viral hepatitis C: Secondary | ICD-10-CM | POA: Diagnosis not present

## 2023-06-09 DIAGNOSIS — F411 Generalized anxiety disorder: Secondary | ICD-10-CM | POA: Diagnosis not present

## 2023-06-09 DIAGNOSIS — F1120 Opioid dependence, uncomplicated: Secondary | ICD-10-CM | POA: Diagnosis not present

## 2023-06-10 DIAGNOSIS — F1120 Opioid dependence, uncomplicated: Secondary | ICD-10-CM | POA: Diagnosis not present

## 2023-06-10 DIAGNOSIS — F411 Generalized anxiety disorder: Secondary | ICD-10-CM | POA: Diagnosis not present

## 2023-06-15 DIAGNOSIS — F1120 Opioid dependence, uncomplicated: Secondary | ICD-10-CM | POA: Diagnosis not present

## 2023-06-15 DIAGNOSIS — F411 Generalized anxiety disorder: Secondary | ICD-10-CM | POA: Diagnosis not present

## 2023-06-16 DIAGNOSIS — F17201 Nicotine dependence, unspecified, in remission: Secondary | ICD-10-CM | POA: Diagnosis not present

## 2023-06-16 DIAGNOSIS — F1120 Opioid dependence, uncomplicated: Secondary | ICD-10-CM | POA: Diagnosis not present

## 2023-06-16 DIAGNOSIS — F411 Generalized anxiety disorder: Secondary | ICD-10-CM | POA: Diagnosis not present

## 2023-06-16 DIAGNOSIS — F1490 Cocaine use, unspecified, uncomplicated: Secondary | ICD-10-CM | POA: Diagnosis not present

## 2023-06-22 DIAGNOSIS — F411 Generalized anxiety disorder: Secondary | ICD-10-CM | POA: Diagnosis not present

## 2023-06-22 DIAGNOSIS — F1120 Opioid dependence, uncomplicated: Secondary | ICD-10-CM | POA: Diagnosis not present

## 2023-06-23 DIAGNOSIS — F411 Generalized anxiety disorder: Secondary | ICD-10-CM | POA: Diagnosis not present

## 2023-06-23 DIAGNOSIS — F1120 Opioid dependence, uncomplicated: Secondary | ICD-10-CM | POA: Diagnosis not present

## 2023-06-23 DIAGNOSIS — F1490 Cocaine use, unspecified, uncomplicated: Secondary | ICD-10-CM | POA: Diagnosis not present

## 2023-06-28 DIAGNOSIS — F1120 Opioid dependence, uncomplicated: Secondary | ICD-10-CM | POA: Diagnosis not present

## 2023-06-28 DIAGNOSIS — F411 Generalized anxiety disorder: Secondary | ICD-10-CM | POA: Diagnosis not present

## 2023-06-28 DIAGNOSIS — F17201 Nicotine dependence, unspecified, in remission: Secondary | ICD-10-CM | POA: Diagnosis not present

## 2023-07-05 DIAGNOSIS — F411 Generalized anxiety disorder: Secondary | ICD-10-CM | POA: Diagnosis not present

## 2023-07-05 DIAGNOSIS — F17201 Nicotine dependence, unspecified, in remission: Secondary | ICD-10-CM | POA: Diagnosis not present

## 2023-07-05 DIAGNOSIS — F1120 Opioid dependence, uncomplicated: Secondary | ICD-10-CM | POA: Diagnosis not present

## 2023-07-29 DIAGNOSIS — F1490 Cocaine use, unspecified, uncomplicated: Secondary | ICD-10-CM | POA: Diagnosis not present

## 2023-07-29 DIAGNOSIS — F1120 Opioid dependence, uncomplicated: Secondary | ICD-10-CM | POA: Diagnosis not present

## 2023-07-29 DIAGNOSIS — F411 Generalized anxiety disorder: Secondary | ICD-10-CM | POA: Diagnosis not present

## 2023-07-29 DIAGNOSIS — B182 Chronic viral hepatitis C: Secondary | ICD-10-CM | POA: Diagnosis not present

## 2023-08-04 DIAGNOSIS — F411 Generalized anxiety disorder: Secondary | ICD-10-CM | POA: Diagnosis not present

## 2023-08-04 DIAGNOSIS — F1120 Opioid dependence, uncomplicated: Secondary | ICD-10-CM | POA: Diagnosis not present

## 2023-08-05 DIAGNOSIS — F1120 Opioid dependence, uncomplicated: Secondary | ICD-10-CM | POA: Diagnosis not present

## 2023-08-05 DIAGNOSIS — B182 Chronic viral hepatitis C: Secondary | ICD-10-CM | POA: Diagnosis not present

## 2023-08-05 DIAGNOSIS — F1490 Cocaine use, unspecified, uncomplicated: Secondary | ICD-10-CM | POA: Diagnosis not present

## 2023-08-05 DIAGNOSIS — F411 Generalized anxiety disorder: Secondary | ICD-10-CM | POA: Diagnosis not present

## 2023-08-08 NOTE — Progress Notes (Unsigned)
CC: OUD   HPI:  Robert Kramer is a 48 y.o. male with past medical history of endocarditis, opioid use disorder, bipolar disorder who presents for OUD appointment.  Please see assessment and plan for full HPI.  Medications: OUD: Suboxone 8-2 mg: 2 and half films daily  Past Medical History:  Diagnosis Date   Anxiety    Bipolar 1 disorder (HCC)    Bipolar 1 disorder (HCC)    DDD (degenerative disc disease), lumbar    Depression    Drug-seeking behavior    Hallux valgus with bunions    Mood swings    Polysubstance abuse (HCC)      Current Outpatient Medications:    Buprenorphine HCl-Naloxone HCl (SUBOXONE) 8-2 MG FILM, Please take 1 film in the morning, 0.5 films in the afternoon, and take 1 film at night., Disp: 33 each, Rfl: 0  Review of Systems:    Negative except for what is stated in HPI  Physical Exam:  Vitals:   08/09/23 1503  BP: 136/79  Pulse: 78  Temp: 98.1 F (36.7 C)  TempSrc: Oral  SpO2: 98%  Weight: 179 lb 3.2 oz (81.3 kg)  Height: 5\' 11"  (1.803 m)   General: Patient is sitting comfortably in the room  Neck: No JVD Cardio: Regular rate and rhythm, grade 3 out of 6 systolic murmur best heard at left sternal lower border Pulmonary: Clear to ausculation bilaterally with no rales, rhonchi, and crackles  Abdomen: Soft, nontender with normoactive bowel sounds with no rebound or guarding, no hepatosplenomegaly  Assessment & Plan:   Opioid use disorder, severe, dependence (HCC) Patient has a past medical history of opioid use disorder.  He has used multiple substances in the past.  He states he has been lost to follow-up in the setting of him being incarcerated.  After getting out, he reports he established with another clinic.  He states that he would like to come back here.  When he was with [place], he was taking Suboxone 8-2 mg, 2.5 films daily.  He states that helped with his symptoms.  He would like to go back to that.  Fortunately, patient did not have  a lapse in treatment as he went elsewhere.  His current dose is 2 films daily.  Patient currently denies any cravings or relapses.  He does report that he does think about heroin at times, but has not used.  He does admit to using THC.  He states that the current dose he is on with his current provider is not appropriate for him and he feels that he gets withdrawal symptoms at night.  Plan: -Plan to increase Suboxone to 2.5 films daily.  8 mg in morning, 4 mg in afternoon, 8 mg at night -Close follow-up in 2 weeks -UDS today    History of endocarditis 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  He completed IV antibiotic therapy at that time.  Patient was lost to follow-up with cardiothoracic surgery for possible valve replacement, but was lost to follow-up.  Fortunately, patient did not have any concerns related to his valve since he has been lost to follow-up.  No concerns for right heart failure on my exam today.  I do hear a grade 3 out of 6 systolic murmur noted to the lower sternal border on the left side.  Will evaluate with echocardiogram today.  Plan: -Follow-up echocardiogram  Hepatitis C virus infection  Patient has a past medical history of hepatitis C infection.  He does report he had treatment for this in 2023.  I do not see records.  Will obtain viral load today.  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  This could be related to his hepatitis C.  Will check labs today.  Patient also had thrombocytopenia which we will evaluate today with CBC.  Plan: -Right upper quadrant ultrasound with elastography -HCV viral load -CMP pending  Thrombocytopenia (HCC) 3 years ago patient had CBC showing thrombocytopenia.  Do wonder if this is related to hepatic fibrosis and splenic sequestration.  Will evaluate with CBC today.  Fortunately, patient has had no bruising  or any petechiae.  Plan: -CBC pending  Patient discussed with Dr. Marquita Palms, DO PGY-2 Internal Medicine Resident  Pager: [phone]

## 2023-08-09 ENCOUNTER — Ambulatory Visit (INDEPENDENT_AMBULATORY_CARE_PROVIDER_SITE_OTHER): Payer: Medicaid Other | Admitting: Student

## 2023-08-09 ENCOUNTER — Other Ambulatory Visit (HOSPITAL_COMMUNITY): Payer: Self-pay

## 2023-08-09 ENCOUNTER — Encounter: Payer: Self-pay | Admitting: Student

## 2023-08-09 VITALS — BP 136/79 | HR 78 | Temp 98.1°F | Ht 71.0 in | Wt 179.2 lb

## 2023-08-09 DIAGNOSIS — Z8619 Personal history of other infectious and parasitic diseases: Secondary | ICD-10-CM

## 2023-08-09 DIAGNOSIS — F1120 Opioid dependence, uncomplicated: Secondary | ICD-10-CM

## 2023-08-09 DIAGNOSIS — Z8679 Personal history of other diseases of the circulatory system: Secondary | ICD-10-CM | POA: Diagnosis not present

## 2023-08-09 DIAGNOSIS — D696 Thrombocytopenia, unspecified: Secondary | ICD-10-CM

## 2023-08-09 DIAGNOSIS — I079 Rheumatic tricuspid valve disease, unspecified: Secondary | ICD-10-CM

## 2023-08-09 MED ORDER — BUPRENORPHINE HCL-NALOXONE HCL 8-2 MG SL FILM
ORAL_FILM | SUBLINGUAL | 0 refills | Status: DC
  Filled 2023-08-09 – 2023-08-10 (×2): qty 33, 14d supply, fill #0
  Filled 2023-08-10: qty 33, 13d supply, fill #0

## 2023-08-09 NOTE — Patient Instructions (Signed)
Robert Kramer, Robert Kramer you for allowing me to take part in your care today.  Here are your instructions.  1.  I have sent your Suboxone over to the pharmacy.  2. I am checking blood work today.  I will call you with results.  3.  I am checking an echocardiogram to evaluate your heart valves.  4.  I am checking your liver to make sure you are not developing cirrhosis.  5.  Please come back in 2 weeks and at this time we will see if your Suboxone is working well for you.  Thank you, Dr. Allena Katz  If you have any other questions please contact the internal medicine clinic at [phone] If it is after hours, please call the [place] hospital at [phone] and then ask the person who picks up for the resident on call.

## 2023-08-09 NOTE — Assessment & Plan Note (Signed)
Patient has a past medical history of opioid use disorder.  He has used multiple substances in the past.  He states he has been lost to follow-up in the setting of him being incarcerated.  After getting out, he reports he established with another clinic.  He states that he would like to come back here.  When he was with [place], he was taking Suboxone 8-2 mg, 2.5 films daily.  He states that helped with his symptoms.  He would like to go back to that.  Fortunately, patient did not have a lapse in treatment as he went elsewhere.  His current dose is 2 films daily.  Patient currently denies any cravings or relapses.  He does report that he does think about heroin at times, but has not used.  He does admit to using THC.  He states that the current dose he is on with his current provider is not appropriate for him and he feels that he gets withdrawal symptoms at night.  Plan: -Plan to increase Suboxone to 2.5 films daily.  8 mg in morning, 4 mg in afternoon, 8 mg at night -Close follow-up in 2 weeks -UDS today

## 2023-08-09 NOTE — Assessment & Plan Note (Signed)
3 years ago patient had CBC showing thrombocytopenia.  Do wonder if this is related to hepatic fibrosis and splenic sequestration.  Will evaluate with CBC today.  Fortunately, patient has had no bruising or any petechiae.  Plan: -CBC pending

## 2023-08-09 NOTE — Assessment & Plan Note (Addendum)
Patient has a past medical history of hepatitis C infection.  He does report he had treatment for this in 2023.  I do not see records.  Will obtain viral load today.  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  This could be related to his hepatitis C.  Will check labs today.  Patient also had thrombocytopenia which we will evaluate today with CBC.  Plan: -Right upper quadrant ultrasound with elastography -HCV viral load -CMP pending

## 2023-08-09 NOTE — Assessment & Plan Note (Addendum)
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  He completed IV antibiotic therapy at that time.  Patient was lost to follow-up with cardiothoracic surgery for possible valve replacement, but was lost to follow-up.  Fortunately, patient did not have any concerns related to his valve since he has been lost to follow-up.  No concerns for right heart failure on my exam today.  I do hear a grade 3 out of 6 systolic murmur noted to the lower sternal border on the left side.  Will evaluate with echocardiogram today.  Plan: -Follow-up echocardiogram

## 2023-08-10 ENCOUNTER — Encounter: Payer: Self-pay | Admitting: Student

## 2023-08-10 ENCOUNTER — Ambulatory Visit: Payer: Self-pay | Admitting: Internal Medicine

## 2023-08-10 ENCOUNTER — Other Ambulatory Visit (HOSPITAL_COMMUNITY): Payer: Self-pay

## 2023-08-10 ENCOUNTER — Other Ambulatory Visit: Payer: Self-pay

## 2023-08-10 LAB — CBC
Hematocrit: 46.5 % (ref 37.5–51.0)
Hemoglobin: 15.4 g/dL (ref 13.0–17.7)
MCH: 29.2 pg (ref 26.6–33.0)
MCHC: 33.1 g/dL (ref 31.5–35.7)
MCV: 88 fL (ref 79–97)
Platelets: 136 10*3/uL — ABNORMAL LOW (ref 150–450)
RBC: 5.28 x10E6/uL (ref 4.14–5.80)
RDW: 12.8 % (ref 11.6–15.4)
WBC: 8.3 10*3/uL (ref 3.4–10.8)

## 2023-08-10 LAB — CMP14 + ANION GAP
ALT: 11 [IU]/L (ref 0–44)
AST: 21 [IU]/L (ref 0–40)
Albumin: 4.5 g/dL (ref 4.1–5.1)
Alkaline Phosphatase: 115 [IU]/L (ref 44–121)
Anion Gap: 17 mmol/L (ref 10.0–18.0)
BUN/Creatinine Ratio: 20 (ref 9–20)
BUN: 17 mg/dL (ref 6–24)
Bilirubin Total: 1.7 mg/dL — ABNORMAL HIGH (ref 0.0–1.2)
CO2: 23 mmol/L (ref 20–29)
Calcium: 9.9 mg/dL (ref 8.7–10.2)
Chloride: 102 mmol/L (ref 96–106)
Creatinine, Ser: 0.83 mg/dL (ref 0.76–1.27)
Globulin, Total: 3 g/dL (ref 1.5–4.5)
Glucose: 106 mg/dL — ABNORMAL HIGH (ref 70–99)
Potassium: 4.2 mmol/L (ref 3.5–5.2)
Sodium: 142 mmol/L (ref 134–144)
Total Protein: 7.5 g/dL (ref 6.0–8.5)
eGFR: 107 mL/min/{1.73_m2} (ref >59)

## 2023-08-10 LAB — HCV RNA QUANT: Hepatitis C Quantitation: NOT DETECTED [IU]/mL

## 2023-08-10 NOTE — Progress Notes (Signed)
 Internal Medicine Clinic Attending  Case discussed with the resident at the time of the visit.  We reviewed the resident's history and exam and pertinent patient test results.  I agree with the assessment, diagnosis, and plan of care documented in the resident's note.

## 2023-08-11 LAB — TOXASSURE SELECT,+ANTIDEPR,UR

## 2023-08-17 ENCOUNTER — Other Ambulatory Visit: Payer: Medicaid Other

## 2023-08-18 ENCOUNTER — Emergency Department (HOSPITAL_COMMUNITY): Payer: Medicaid Other

## 2023-08-18 ENCOUNTER — Other Ambulatory Visit: Payer: Self-pay

## 2023-08-18 ENCOUNTER — Emergency Department (HOSPITAL_COMMUNITY)
Admission: EM | Admit: 2023-08-18 | Discharge: 2023-08-19 | Disposition: A | Discharge status: Home / Self Care | Payer: Medicaid Other | Attending: Emergency Medicine | Admitting: Emergency Medicine

## 2023-08-18 ENCOUNTER — Encounter: Payer: Self-pay | Admitting: Internal Medicine

## 2023-08-18 ENCOUNTER — Encounter (HOSPITAL_COMMUNITY): Payer: Self-pay

## 2023-08-18 DIAGNOSIS — R0789 Other chest pain: Secondary | ICD-10-CM | POA: Diagnosis not present

## 2023-08-18 DIAGNOSIS — Q272 Other congenital malformations of renal artery: Secondary | ICD-10-CM | POA: Diagnosis not present

## 2023-08-18 DIAGNOSIS — Z0389 Encounter for observation for other suspected diseases and conditions ruled out: Secondary | ICD-10-CM | POA: Diagnosis not present

## 2023-08-18 DIAGNOSIS — R0602 Shortness of breath: Secondary | ICD-10-CM | POA: Insufficient documentation

## 2023-08-18 DIAGNOSIS — R000 Tachycardia, unspecified: Secondary | ICD-10-CM | POA: Diagnosis not present

## 2023-08-18 DIAGNOSIS — R079 Chest pain, unspecified: Secondary | ICD-10-CM | POA: Diagnosis not present

## 2023-08-18 DIAGNOSIS — F1910 Other psychoactive substance abuse, uncomplicated: Secondary | ICD-10-CM | POA: Diagnosis not present

## 2023-08-18 DIAGNOSIS — I700 Atherosclerosis of aorta: Secondary | ICD-10-CM | POA: Diagnosis not present

## 2023-08-18 DIAGNOSIS — I517 Cardiomegaly: Secondary | ICD-10-CM | POA: Diagnosis not present

## 2023-08-18 DIAGNOSIS — R456 Violent behavior: Secondary | ICD-10-CM | POA: Diagnosis not present

## 2023-08-18 LAB — URINALYSIS, ROUTINE W REFLEX MICROSCOPIC
Bilirubin Urine: NEGATIVE
Glucose, UA: NEGATIVE mg/dL
Hgb urine dipstick: NEGATIVE
Ketones, ur: 20 mg/dL — AB
Leukocytes,Ua: NEGATIVE
Nitrite: NEGATIVE
Protein, ur: NEGATIVE mg/dL
Specific Gravity, Urine: 1.01 (ref 1.005–1.030)
pH: 7 (ref 5.0–8.0)

## 2023-08-18 LAB — COMPREHENSIVE METABOLIC PANEL
ALT: 14 U/L (ref 0–44)
AST: 36 U/L (ref 15–41)
Albumin: 4.5 g/dL (ref 3.5–5.0)
Alkaline Phosphatase: 69 U/L (ref 38–126)
Anion gap: 23 — ABNORMAL HIGH (ref 5–15)
BUN: 19 mg/dL (ref 6–20)
CO2: 13 mmol/L — ABNORMAL LOW (ref 22–32)
Calcium: 10.3 mg/dL (ref 8.9–10.3)
Chloride: 103 mmol/L (ref 98–111)
Creatinine, Ser: 1.22 mg/dL (ref 0.61–1.24)
GFR, Estimated: >60 mL/min (ref >60)
Glucose, Bld: 128 mg/dL — ABNORMAL HIGH (ref 70–99)
Potassium: 3.5 mmol/L (ref 3.5–5.1)
Sodium: 139 mmol/L (ref 135–145)
Total Bilirubin: 2.6 mg/dL — ABNORMAL HIGH (ref 0.0–1.2)
Total Protein: 8.6 g/dL — ABNORMAL HIGH (ref 6.5–8.1)

## 2023-08-18 LAB — CBC WITH DIFFERENTIAL/PLATELET
Abs Immature Granulocytes: 0.03 10*3/uL (ref 0.00–0.07)
Basophils Absolute: 0 10*3/uL (ref 0.0–0.1)
Basophils Relative: 0 %
Eosinophils Absolute: 0.1 10*3/uL (ref 0.0–0.5)
Eosinophils Relative: 1 %
HCT: 49 % (ref 39.0–52.0)
Hemoglobin: 16.5 g/dL (ref 13.0–17.0)
Immature Granulocytes: 0 %
Lymphocytes Relative: 29 %
Lymphs Abs: 2.5 10*3/uL (ref 0.7–4.0)
MCH: 29.5 pg (ref 26.0–34.0)
MCHC: 33.7 g/dL (ref 30.0–36.0)
MCV: 87.5 fL (ref 80.0–100.0)
Monocytes Absolute: 0.8 10*3/uL (ref 0.1–1.0)
Monocytes Relative: 9 %
Neutro Abs: 5.3 10*3/uL (ref 1.7–7.7)
Neutrophils Relative %: 61 %
Platelets: 193 10*3/uL (ref 150–400)
RBC: 5.6 MIL/uL (ref 4.22–5.81)
RDW: 13.1 % (ref 11.5–15.5)
WBC: 8.8 10*3/uL (ref 4.0–10.5)
nRBC: 0 % (ref 0.0–0.2)

## 2023-08-18 LAB — RAPID URINE DRUG SCREEN, HOSP PERFORMED
Amphetamines: POSITIVE — AB
Barbiturates: NOT DETECTED
Benzodiazepines: POSITIVE — AB
Cocaine: POSITIVE — AB
Opiates: NOT DETECTED
Tetrahydrocannabinol: POSITIVE — AB

## 2023-08-18 LAB — BASIC METABOLIC PANEL
Anion gap: 10 (ref 5–15)
BUN: 17 mg/dL (ref 6–20)
CO2: 24 mmol/L (ref 22–32)
Calcium: 9.3 mg/dL (ref 8.9–10.3)
Chloride: 103 mmol/L (ref 98–111)
Creatinine, Ser: 0.85 mg/dL (ref 0.61–1.24)
GFR, Estimated: >60 mL/min (ref >60)
Glucose, Bld: 85 mg/dL (ref 70–99)
Potassium: 3.9 mmol/L (ref 3.5–5.1)
Sodium: 137 mmol/L (ref 135–145)

## 2023-08-18 LAB — LIPASE, BLOOD: Lipase: 23 U/L (ref 11–51)

## 2023-08-18 LAB — ACETAMINOPHEN LEVEL: Acetaminophen (Tylenol), Serum: <10 ug/mL — ABNORMAL LOW (ref 10–30)

## 2023-08-18 LAB — CBG MONITORING, ED: Glucose-Capillary: 117 mg/dL — ABNORMAL HIGH (ref 70–99)

## 2023-08-18 LAB — MAGNESIUM: Magnesium: 1.8 mg/dL (ref 1.7–2.4)

## 2023-08-18 LAB — BRAIN NATRIURETIC PEPTIDE: B Natriuretic Peptide: 16 pg/mL (ref 0.0–100.0)

## 2023-08-18 LAB — BETA-HYDROXYBUTYRIC ACID: Beta-Hydroxybutyric Acid: 1.81 mmol/L — ABNORMAL HIGH (ref 0.05–0.27)

## 2023-08-18 LAB — TROPONIN I (HIGH SENSITIVITY)
Troponin I (High Sensitivity): 2 ng/L (ref <18)
Troponin I (High Sensitivity): 3 ng/L (ref <18)

## 2023-08-18 LAB — LACTIC ACID, PLASMA: Lactic Acid, Venous: 0.8 mmol/L (ref 0.5–1.9)

## 2023-08-18 LAB — ETHANOL: Alcohol, Ethyl (B): <10 mg/dL (ref <10)

## 2023-08-18 LAB — SALICYLATE LEVEL: Salicylate Lvl: <7 mg/dL — ABNORMAL LOW (ref 7.0–30.0)

## 2023-08-18 MED ORDER — MIDAZOLAM HCL 2 MG/2ML IJ SOLN
2.0000 mg | Freq: Once | INTRAMUSCULAR | Status: AC
  Administered 2023-08-18: 2 mg via INTRAMUSCULAR
  Filled 2023-08-18: qty 2

## 2023-08-18 MED ORDER — LACTATED RINGERS IV BOLUS
1000.0000 mL | Freq: Once | INTRAVENOUS | Status: AC
  Administered 2023-08-18: 1000 mL via INTRAVENOUS

## 2023-08-18 MED ORDER — DIPHENHYDRAMINE HCL 50 MG/ML IJ SOLN
25.0000 mg | Freq: Once | INTRAMUSCULAR | Status: AC
  Administered 2023-08-18: 25 mg via INTRAMUSCULAR
  Filled 2023-08-18: qty 1

## 2023-08-18 MED ORDER — HALOPERIDOL LACTATE 5 MG/ML IJ SOLN
5.0000 mg | Freq: Once | INTRAMUSCULAR | Status: AC
  Administered 2023-08-18: 5 mg via INTRAMUSCULAR
  Filled 2023-08-18: qty 1

## 2023-08-18 MED ORDER — IOHEXOL 350 MG/ML SOLN
100.0000 mL | Freq: Once | INTRAVENOUS | Status: AC | PRN
  Administered 2023-08-18: 100 mL via INTRAVENOUS

## 2023-08-18 MED ORDER — ASPIRIN 81 MG PO CHEW
324.0000 mg | CHEWABLE_TABLET | Freq: Once | ORAL | Status: AC
  Administered 2023-08-18: 324 mg via ORAL
  Filled 2023-08-18: qty 4

## 2023-08-18 MED ORDER — DIAZEPAM 5 MG/ML IJ SOLN
5.0000 mg | Freq: Once | INTRAMUSCULAR | Status: AC
  Administered 2023-08-18: 5 mg via INTRAVENOUS
  Filled 2023-08-18: qty 2

## 2023-08-18 NOTE — ED Triage Notes (Signed)
Pt BIB ems for questionable overdose. Pt is a meth user. Pt informed EMS that he has a heart valve problem and "it has busted and is leaking in his chest." Pt is yelling, fidgety, anxious, and will not sit still to get vs during triage. Security at bedside. CBG 146. [employer] was on scene but did not escort pt.

## 2023-08-18 NOTE — ED Notes (Signed)
Patient transported to CT

## 2023-08-18 NOTE — Discharge Instructions (Addendum)
You have nodules in the upper part of your right lung.  This may simply be scarring from a prior infection.  You should get this reimaged in 3 to 6 months to ensure that they are not worsening.  You also have some dilated chambers of your heart.  You should establish care with a cardiologist.  A referral has been ordered.  If you do not hear from their office in the next couple days, call the telephone number below to set up a follow-up appointment.  Return to the emergency department for any new or worsening symptoms of concern.

## 2023-08-18 NOTE — ED Provider Notes (Signed)
[place] EMERGENCY DEPARTMENT AT [facility] Provider Note   CSN: [id number] Arrival date & time: 08/18/23  1737     History  Chief Complaint  Patient presents with   Drug Overdose    Robert Kramer is a 48 y.o. male.   Drug Overdose Associated symptoms include chest pain.  Patient presents for chest pain.  Medical history includes hepatitis C, bipolar disorder, polysubstance abuse.  He is prescribed Suboxone.  Recent outpatient lab work was positive for methamphetamine and cocaine.  Patient endorses methamphetamine use with last use being yesterday.  Family called EMS today due to report of chest pains.  EMS noted intermittent agitation during transit.  Patient describes a chest discomfort as if something has burst and fluid is leaking.     Home Medications Prior to Admission medications   Medication Sig Start Date End Date Taking? Authorizing Provider  Buprenorphine HCl-Naloxone HCl (SUBOXONE) 8-2 MG FILM Dissolve 1 film under tongue in the morning, 1/2 film in the afternoon, and 1 film at night. 08/09/23  Yes Modena Slater, DO  hydrOXYzine (ATARAX) 10 MG tablet Take 10 mg by mouth 3 (three) times daily as needed for anxiety. 06/16/23  Yes [provider]  ferrous sulfate 325 (65 FE) MG tablet Take 1 tablet (325 mg total) by mouth 3 (three) times daily with meals. 04/22/20 04/24/20  Russella Dar, NP      Allergies    Tramadol    Review of Systems   Review of Systems  Cardiovascular:  Positive for chest pain.  Psychiatric/Behavioral:  Positive for agitation. The patient is nervous/anxious.   All other systems reviewed and are negative.   Physical Exam Updated Vital Signs BP 120/84 (BP Location: Left Arm)   Pulse 86   Temp 97.8 F (36.6 C) (Oral)   Resp 16   Ht 5\' 11"  (1.803 m)   Wt 81.3 kg   SpO2 93%   BMI 24.99 kg/m  Physical Exam Vitals and nursing note reviewed.  Constitutional:      General: He is not in acute distress.    Appearance:  He is well-developed and normal weight. He is not ill-appearing, toxic-appearing or diaphoretic.  HENT:     Head: Normocephalic and atraumatic.     Right Ear: External ear normal.     Left Ear: External ear normal.     Nose: Nose normal.     Mouth/Throat:     Mouth: Mucous membranes are moist.  Eyes:     Extraocular Movements: Extraocular movements intact.     Conjunctiva/sclera: Conjunctivae normal.  Cardiovascular:     Rate and Rhythm: Normal rate and regular rhythm.  Pulmonary:     Effort: Pulmonary effort is normal. No respiratory distress.  Abdominal:     General: There is no distension.     Palpations: Abdomen is soft.  Musculoskeletal:        General: No swelling or deformity. Normal range of motion.     Cervical back: Normal range of motion.  Skin:    General: Skin is warm and dry.     Coloration: Skin is not jaundiced or pale.  Neurological:     General: No focal deficit present.     Mental Status: He is alert and oriented to person, place, and time.  Psychiatric:        Mood and Affect: Mood is anxious.        Speech: Speech is rapid and pressured.  Behavior: Behavior is uncooperative, agitated, aggressive and combative.     ED Results / Procedures / Treatments   Labs (all labs ordered are listed, but only abnormal results are displayed) Labs Reviewed  COMPREHENSIVE METABOLIC PANEL - Abnormal; Notable for the following components:      Result Value   CO2 13 (*)    Glucose, Bld 128 (*)    Total Protein 8.6 (*)    Total Bilirubin 2.6 (*)    Anion gap 23 (*)    All other components within normal limits  CBG MONITORING, ED - Abnormal; Notable for the following components:   Glucose-Capillary 117 (*)    All other components within normal limits  MAGNESIUM  LIPASE, BLOOD  CBC WITH DIFFERENTIAL/PLATELET  ETHANOL  BRAIN NATRIURETIC PEPTIDE  URINALYSIS, ROUTINE W REFLEX MICROSCOPIC  RAPID URINE DRUG SCREEN, HOSP PERFORMED  TROPONIN I (HIGH SENSITIVITY)   TROPONIN I (HIGH SENSITIVITY)    EKG EKG Interpretation Date/Time:  Wednesday August 18 2023 18:50:21 EST Ventricular Rate:  96 PR Interval:  138 QRS Duration:  102 QT Interval:  392 QTC Calculation: 495 R Axis:   188  Text Interpretation: Sinus rhythm with sinus arrhythmia with occasional Premature ventricular complexes Right superior axis deviation Prolonged QT Confirmed by Gloris Manchester ([id number]) on 08/18/2023 6:53:57 PM  Radiology CT Angio Chest/Abd/Pel for Dissection W and/or Wo Contrast Result Date: 08/18/2023 CLINICAL DATA:  Methamphetamine abuse, valvular heart disease, questionable overdose, anxiety EXAM: CT ANGIOGRAPHY CHEST, ABDOMEN AND PELVIS TECHNIQUE: Non-contrast CT of the chest was initially obtained. Multidetector CT imaging through the chest, abdomen and pelvis was performed using the standard protocol during bolus administration of intravenous contrast. Multiplanar reconstructed images and MIPs were obtained and reviewed to evaluate the vascular anatomy. RADIATION DOSE REDUCTION: This exam was performed according to the departmental dose-optimization program which includes automated exposure control, adjustment of the mA and/or kV according to patient size and/or use of iterative reconstruction technique. CONTRAST:  OMNIPAQUE IOHEXOL 350 MG/ML SOLN COMPARISON:  08/18/2023, 03/11/2020 FINDINGS: CTA CHEST FINDINGS Cardiovascular: No evidence of thoracic aortic aneurysm or dissection. Prominent dilation of the right atrium and right ventricle. No pericardial effusion. Adequate evaluation of the central pulmonary vasculature, with no evidence of central or proximal segmental pulmonary emboli. Mediastinum/Nodes: No enlarged mediastinal, hilar, or axillary lymph nodes. Thyroid gland, trachea, and esophagus demonstrate no significant findings. Lungs/Pleura: There are small spiculated nodules within the right upper lobe, measuring 8 mm reference image 28/7 and 11 mm reference image  62/7, at the site where prior cavitary lesions were observed. This likely reflects post infectious or post inflammatory scarring, though follow-up will be warranted to exclude underlying neoplasm. No acute airspace disease, effusion, or pneumothorax. Central airways are patent. Musculoskeletal: There are no acute or destructive bony abnormalities. Reconstructed images demonstrate no additional findings. Review of the MIP images confirms the above findings. CTA ABDOMEN AND PELVIS FINDINGS VASCULAR Aorta: Normal caliber aorta without aneurysm, dissection, vasculitis or significant stenosis. Mild atherosclerosis. Celiac: Patent without evidence of aneurysm, dissection, vasculitis or significant stenosis. SMA: Patent without evidence of aneurysm, dissection, vasculitis or significant stenosis. Renals: Both renal arteries are patent without evidence of aneurysm, dissection, vasculitis, fibromuscular dysplasia or significant stenosis. There is a small accessory left renal artery supplying portions of the lower pole, which is widely patent. IMA: Patent without evidence of aneurysm, dissection, vasculitis or significant stenosis. Inflow: Patent without evidence of aneurysm, dissection, vasculitis or significant stenosis. Mild atheromatous plaque within the right common iliac artery. Veins:  No obvious venous abnormality within the limitations of this arterial phase study. Review of the MIP images confirms the above findings. NON-VASCULAR Hepatobiliary: No focal liver abnormality is seen. No gallstones, gallbladder wall thickening, or biliary dilatation. Pancreas: Unremarkable. No pancreatic ductal dilatation or surrounding inflammatory changes. Spleen: Normal in size without focal abnormality. Adrenals/Urinary Tract: Adrenal glands are unremarkable. Kidneys are normal, without renal calculi, focal lesion, or hydronephrosis. Bladder is unremarkable. Stomach/Bowel: No bowel obstruction or ileus. Normal appendix right lower  quadrant. No bowel wall thickening or inflammatory change. Lymphatic: No pathologic adenopathy within the abdomen or pelvis. Reproductive: Prostate is unremarkable. Other: No free fluid or free intraperitoneal gas. No abdominal wall hernia. Musculoskeletal: No acute or destructive bony abnormalities. Reconstructed images demonstrate no additional findings. Review of the MIP images confirms the above findings. IMPRESSION: Vascular: 1. No evidence of thoracoabdominal aortic aneurysm or dissection. 2. No evidence of pulmonary embolus. 3.  Aortic Atherosclerosis (ICD10-I70.0). Nonvascular: 1. Prominent dilation of the right atrium and right ventricle, correlation with echocardiography may be useful. 2. Small spiculated nodules within the right upper lobe, at the site of prior cavitary lesions. This likely reflects postinflammatory or postinfectious scarring. However, noncontrast chest CT at 3-6 months is recommended. If the nodules are stable at time of repeat CT, then future CT at 18-24 months (from today's scan) is considered optional for low-risk patients, but is recommended for high-risk patients. This recommendation follows the consensus statement: Guidelines for Management of Incidental Pulmonary Nodules Detected on CT Images: From the Fleischner Society 2017; Radiology 2017; 284:228-243. 3. No acute intra-abdominal or intrapelvic process. Electronically Signed   By: Sharlet Salina M.D.   On: 08/18/2023 20:36   DG Chest Portable 1 View Result Date: 08/18/2023 CLINICAL DATA:  Possible overdose. Heart valve problem. Patient is a Patent attorney and anxious. EXAM: PORTABLE CHEST 1 VIEW COMPARISON:  04/08/2020 FINDINGS: The heart size and mediastinal contours are within normal limits. Both lungs are clear. The visualized skeletal structures are unremarkable. IMPRESSION: No active disease. Electronically Signed   By: Minerva Fester M.D.   On: 08/18/2023 19:38    Procedures Procedures    Medications Ordered in  ED Medications  aspirin chewable tablet 324 mg (324 mg Oral Given 08/18/23 1905)  haloperidol lactate (HALDOL) injection 5 mg (5 mg Intramuscular Given 08/18/23 1810)  midazolam (VERSED) injection 2 mg (2 mg Intramuscular Given 08/18/23 1810)  diphenhydrAMINE (BENADRYL) injection 25 mg (25 mg Intramuscular Given 08/18/23 1810)  diazepam (VALIUM) injection 5 mg (5 mg Intravenous Given 08/18/23 1830)  iohexol (OMNIPAQUE) 350 MG/ML injection 100 mL (100 mLs Intravenous Contrast Given 08/18/23 1935)  lactated ringers bolus 1,000 mL (0 mLs Intravenous Stopped 08/18/23 2236)    ED Course/ Medical Decision Making/ A&P                                 Medical Decision Making Amount and/or Complexity of Data Reviewed Labs: ordered. Radiology: ordered.  Risk OTC drugs. Prescription drug management.   This patient presents to the ED for concern of chest discomfort, this involves an extensive number of treatment options, and is a complaint that carries with it a high risk of complications and morbidity.  The differential diagnosis includes ACS, pericarditis, intoxication, pneumonia, PE, dissection   Co morbidities that complicate the patient evaluation  hepatitis C, bipolar disorder, polysubstance abuse   Additional history obtained:  Additional history obtained from EMS External records from outside source  obtained and reviewed including EMR   Lab Tests:  I Ordered, and personally interpreted labs.  The pertinent results include: Normal kidney function, normal electrolytes.  Anion gap metabolic acidosis is present.  Hemoglobin is normal.  No leukocytosis is present.  Troponin and BNP are normal.   Imaging Studies ordered:  I ordered imaging studies including x-ray, CTA dissection study I independently visualized and interpreted imaging which showed no acute findings.  There was dilation of right atrium and right ventricle; spiculated nodules in the right upper lobe of lung at site of  prior cavitary lesions. I agree with the radiologist interpretation   Cardiac Monitoring: / EKG:  The patient was maintained on a cardiac monitor.  I personally viewed and interpreted the cardiac monitored which showed an underlying rhythm of: Sinus rhythm   Problem List / ED Course / Critical interventions / Medication management  Patient presents for concern of chest discomfort.  He endorses methamphetamine use yesterday.  He has been agitated with EMS.  On arrival in the ED, he is anxious and agitated.  He describes a burst vessel sensation in his chest.  Initial vital signs notable for tachycardia and hypertension.  EKG is reassuring.  Workup was initiated.  Patient received Haldol, Versed, and Valium for agitation.  He was subsequently calm and sleeping.  Vital signs normalized.  Workup is notable for anion gap metabolic acidosis.  IV fluids were ordered.  CTA dissection study does not appear to show any acute findings.  He does have spiculated nodules in the right upper lobe of lung as well as dilated right atrium and ventricle.  Repeat BMP ordered to reassess acidosis.  Care of patient signed out to oncoming ED provider. I ordered medication including Haldol, Versed, Valium, Benadryl for agitation; ASA for chest pain; IV fluids for hydration Reevaluation of the patient after these medicines showed that the patient improved I have reviewed the patients home medicines and have made adjustments as needed   Social Determinants of Health:  Polysubstance abuse        Final Clinical Impression(s) / ED Diagnoses Final diagnoses:  Chest discomfort    Rx / DC Orders ED Discharge Orders     None         Gloris Manchester, MD 08/18/23 2238

## 2023-08-18 NOTE — ED Provider Notes (Signed)
Care assumed from Dr. Edilia Bo, patient presented.  The intoxicated from drugs and also with chest pain.  Initial evaluation showed metabolic acidosis, getting repeat BMP.  Repeat BMP shows resolution of acidosis.  Patient has been observed overnight.  He is able to ambulate without difficulty and is safe for discharge.   Dione Booze, MD 08/19/23 [phone]

## 2023-08-18 NOTE — ED Notes (Signed)
Family called asking for update- Sister who is in chart updated That pt is in the ed being watch by staff at this time Family wishes to be updated when available

## 2023-08-19 NOTE — ED Notes (Signed)
Family otw for patient

## 2023-08-23 ENCOUNTER — Ambulatory Visit (HOSPITAL_COMMUNITY)
Admission: RE | Admit: 2023-08-23 | Discharge: 2023-08-23 | Disposition: A | Discharge status: Home / Self Care | Payer: Medicaid Other | Source: Ambulatory Visit | Attending: Internal Medicine

## 2023-08-23 ENCOUNTER — Ambulatory Visit: Payer: Medicaid Other | Admitting: Internal Medicine

## 2023-08-23 ENCOUNTER — Other Ambulatory Visit (HOSPITAL_COMMUNITY): Payer: Self-pay

## 2023-08-23 VITALS — BP 131/94 | HR 68 | Temp 97.6°F | Wt 171.6 lb

## 2023-08-23 DIAGNOSIS — Z23 Encounter for immunization: Secondary | ICD-10-CM

## 2023-08-23 DIAGNOSIS — R011 Cardiac murmur, unspecified: Secondary | ICD-10-CM

## 2023-08-23 DIAGNOSIS — I082 Rheumatic disorders of both aortic and tricuspid valves: Secondary | ICD-10-CM | POA: Diagnosis not present

## 2023-08-23 DIAGNOSIS — Z0000 Encounter for general adult medical examination without abnormal findings: Secondary | ICD-10-CM | POA: Insufficient documentation

## 2023-08-23 DIAGNOSIS — I079 Rheumatic tricuspid valve disease, unspecified: Secondary | ICD-10-CM | POA: Diagnosis not present

## 2023-08-23 DIAGNOSIS — F1120 Opioid dependence, uncomplicated: Secondary | ICD-10-CM | POA: Diagnosis not present

## 2023-08-23 LAB — ECHOCARDIOGRAM COMPLETE
AR max vel: 2.34 cm2
AV Area VTI: 2.53 cm2
AV Area mean vel: 2.22 cm2
AV Mean grad: 3 mm[Hg]
AV Peak grad: 5.4 mm[Hg]
Ao pk vel: 1.16 m/s
Area-P 1/2: 3.68 cm2
S' Lateral: 2.4 cm

## 2023-08-23 MED ORDER — BUPRENORPHINE HCL-NALOXONE HCL 8-2 MG SL FILM
ORAL_FILM | SUBLINGUAL | 0 refills | Status: DC
  Filled 2023-08-23: qty 35, 14d supply, fill #0

## 2023-08-23 NOTE — Assessment & Plan Note (Signed)
 Flu shot given today

## 2023-08-23 NOTE — Progress Notes (Signed)
*  PRELIMINARY RESULTS* Echocardiogram 2D Echocardiogram has been performed.  Robert Kramer 08/23/2023, 12:20 PM

## 2023-08-23 NOTE — Progress Notes (Signed)
CC: OUD  HPI:  [name] is a 48 y.o. male living with a history stated below and presents today for a follow up of OUD. Please see problem based assessment and plan for additional details.  Past Medical History:  Diagnosis Date   Anxiety    Bipolar 1 disorder (HCC)    Bipolar 1 disorder (HCC)    DDD (degenerative disc disease), lumbar    Depression    Drug-seeking behavior    Hallux valgus with bunions    Mood swings    Polysubstance abuse (HCC)     Current Outpatient Medications on File Prior to Visit  Medication Sig Dispense Refill   hydrOXYzine (ATARAX) 10 MG tablet Take 10 mg by mouth 3 (three) times daily as needed for anxiety.     [DISCONTINUED] ferrous sulfate 325 (65 FE) MG tablet Take 1 tablet (325 mg total) by mouth 3 (three) times daily with meals. 90 tablet 3   No current facility-administered medications on file prior to visit.    Family History  Problem Relation Age of Onset   Cancer Father     Social History   Socioeconomic History   Marital status: Legally Separated    Spouse name: Not on file   Number of children: Not on file   Years of education: Not on file   Highest education level: Not on file  Occupational History   Not on file  Tobacco Use   Smoking status: Every Day    Current packs/day: 0.50    Average packs/day: 0.5 packs/day for 5.0 years (2.5 ttl pk-yrs)    Types: Cigarettes   Smokeless tobacco: Never   Tobacco comments:    0.5  PPD / TRYING TO QUIT  Substance and Sexual Activity   Alcohol use: Not Currently    Alcohol/week: 1.0 standard drink of alcohol    Types: 1 Cans of beer per week    Comment: occ   Drug use: No    Types: Cocaine, Marijuana    Comment: opiates - quit 2017   Sexual activity: Not on file  Other Topics Concern   Not on file  Social History Narrative   Not on file   Social Drivers of Health   Financial Resource Strain: High Risk (08/09/2023)   Overall Financial Resource Strain (CARDIA)     Difficulty of Paying Living Expenses: Hard  Food Insecurity: No Food Insecurity (08/09/2023)   Hunger Vital Sign    Worried About Running Out of Food in the Last Year: Never true    Ran Out of Food in the Last Year: Never true  Transportation Needs: No Transportation Needs (08/09/2023)   PRAPARE - Administrator, Civil Service (Medical): No    Lack of Transportation (Non-Medical): No  Physical Activity: Inactive (08/09/2023)   Exercise Vital Sign    Days of Exercise per Week: 0 days    Minutes of Exercise per Session: 0 min  Stress: No Stress Concern Present (08/09/2023)   [name]    Feeling of Stress : Not at all  Social Connections: Moderately Isolated (08/09/2023)   Social Connection and Isolation Panel [NHANES]    Frequency of Communication with Friends and Family: More than three times a week    Frequency of Social Gatherings with Friends and Family: More than three times a week    Attends Religious Services: More than 4 times per year    Active Member of [employer]  or Organizations: No    Attends Banker Meetings: Never    Marital Status: Widowed  Intimate Partner Violence: Not At Risk (08/09/2023)   Humiliation, Afraid, Rape, and Kick questionnaire    Fear of Current or Ex-Partner: No    Emotionally Abused: No    Physically Abused: No    Sexually Abused: No    Review of Systems: ROS negative except for what is noted on the assessment and plan.  Vitals:   08/23/23 1349 08/23/23 1439  BP: (!) 135/98 (!) 131/94  Pulse: 69 68  Temp: 97.6 F (36.4 C)   TempSrc: Oral   SpO2: 100%   Weight: 171 lb 9.6 oz (77.8 kg)     Physical Exam: Constitutional: appears well Cardiovascular: regular rate and rhythm, systolic murmur appreciated Pulmonary/Chest: normal work of breathing on room air Skin: warm and dry Psych: normal mood and behavior  Assessment & Plan:    Patient discussed with Dr. Cleda Daub  Opioid use disorder, severe, dependence (HCC) The patient presents for 2-week follow-up of opioid use disorder.  He is on Suboxone 2.5 films per day (was increased from 2 films per day 2 weeks ago).  He denies any relapses or cravings of heroin.  He does admit to using THC and cocaine, with his last cocaine use 4 days ago. He denies any meth use, although we did discuss that it was positive on his UDS.  Patient has been doing well with 2.5 films per day, but states that he does get slightly nauseous before his evening dose of a full film.  Plan: - Continue Suboxone 2.5 films per day, would ideally titrate back down to 2 films per day in the future, as there is not much added benefit to higher doses - Follow-up in 2 weeks - No UDS today, would get at next office visit  Healthcare maintenance - Flu shot given today   Timithy Arons, D.O. [facility] Internal Medicine, PGY-3 Phone: [phone] Date 08/23/2023 Time 5:15 PM

## 2023-08-23 NOTE — Assessment & Plan Note (Signed)
The patient presents for 2-week follow-up of opioid use disorder.  He is on Suboxone 2.5 films per day (was increased from 2 films per day 2 weeks ago).  He denies any relapses or cravings of heroin.  He does admit to using THC and cocaine, with his last cocaine use 4 days ago. He denies any meth use, although we did discuss that it was positive on his UDS.  Patient has been doing well with 2.5 films per day, but states that he does get slightly nauseous before his evening dose of a full film.  Plan: - Continue Suboxone 2.5 films per day, would ideally titrate back down to 2 films per day in the future, as there is not much added benefit to higher doses - Follow-up in 2 weeks - No UDS today, would get at next office visit

## 2023-08-23 NOTE — Patient Instructions (Signed)
Thank you, Mr.Ernesto E Chait for allowing [place] to provide your care today. Today we discussed:  Opioid use disorder Keep taking suboxone 8-2 mg: 1 in the morning, 1/2 in the afternoon, and 1 in the evening Follow up in 2 weeks I recommend you abstain from using other substances, like cocaine  I have ordered the following labs for you:  Lab Orders  No laboratory test(s) ordered today      Referrals ordered today:   Referral Orders  No referral(s) requested today     I have ordered the following medication/changed the following medications:   Stop the following medications: There are no discontinued medications.   Start the following medications: No orders of the defined types were placed in this encounter.    Follow up:  2 weeks      Should you have any questions or concerns please call the internal medicine clinic at [phone].     Elza Rafter, D.O. [facility]

## 2023-08-25 ENCOUNTER — Encounter: Payer: Self-pay | Admitting: Student

## 2023-09-01 ENCOUNTER — Other Ambulatory Visit: Payer: Medicaid Other

## 2023-09-01 NOTE — Progress Notes (Signed)
 Internal Medicine Clinic Attending  Case discussed with the resident at the time of the visit.  We reviewed the resident's history and exam and pertinent patient test results.  I agree with the assessment, diagnosis, and plan of care documented in the resident's note.

## 2023-09-02 ENCOUNTER — Other Ambulatory Visit: Payer: Medicaid Other

## 2023-09-06 ENCOUNTER — Encounter: Payer: Medicaid Other | Admitting: Internal Medicine

## 2023-09-06 NOTE — Progress Notes (Deleted)
 CC: ***  HPI:  [name] is a 48 y.o. male living with a history stated below and presents today for ***. Please see problem based assessment and plan for additional details.  Past Medical History:  Diagnosis Date   Anxiety    Bipolar 1 disorder (HCC)    Bipolar 1 disorder (HCC)    DDD (degenerative disc disease), lumbar    Depression    Drug-seeking behavior    Hallux valgus with bunions    Mood swings    Polysubstance abuse (HCC)     Current Outpatient Medications on File Prior to Visit  Medication Sig Dispense Refill   Buprenorphine HCl-Naloxone HCl (SUBOXONE) 8-2 MG FILM Dissolve 1 film under tongue in the morning, 1/2 film in the afternoon, and 1 film at night. 35 Film 0   hydrOXYzine (ATARAX) 10 MG tablet Take 10 mg by mouth 3 (three) times daily as needed for anxiety.     [DISCONTINUED] ferrous sulfate 325 (65 FE) MG tablet Take 1 tablet (325 mg total) by mouth 3 (three) times daily with meals. 90 tablet 3   No current facility-administered medications on file prior to visit.    Family History  Problem Relation Age of Onset   Cancer Father     Social History   Socioeconomic History   Marital status: Legally Separated    Spouse name: Not on file   Number of children: Not on file   Years of education: Not on file   Highest education level: Not on file  Occupational History   Not on file  Tobacco Use   Smoking status: Every Day    Current packs/day: 0.50    Average packs/day: 0.5 packs/day for 5.0 years (2.5 ttl pk-yrs)    Types: Cigarettes   Smokeless tobacco: Never   Tobacco comments:    0.5  PPD / TRYING TO QUIT  Substance and Sexual Activity   Alcohol use: Not Currently    Alcohol/week: 1.0 standard drink of alcohol    Types: 1 Cans of beer per week    Comment: occ   Drug use: No    Types: Cocaine, Marijuana    Comment: opiates - quit 2017   Sexual activity: Not on file  Other Topics Concern   Not on file  Social History Narrative    Not on file   Social Drivers of Health   Financial Resource Strain: High Risk (08/09/2023)   Overall Financial Resource Strain (CARDIA)    Difficulty of Paying Living Expenses: Hard  Food Insecurity: No Food Insecurity (08/09/2023)   Hunger Vital Sign    Worried About Running Out of Food in the Last Year: Never true    Ran Out of Food in the Last Year: Never true  Transportation Needs: No Transportation Needs (08/09/2023)   PRAPARE - Administrator, Civil Service (Medical): No    Lack of Transportation (Non-Medical): No  Physical Activity: Inactive (08/09/2023)   Exercise Vital Sign    Days of Exercise per Week: 0 days    Minutes of Exercise per Session: 0 min  Stress: No Stress Concern Present (08/09/2023)   [name]    Feeling of Stress : Not at all  Social Connections: Moderately Isolated (08/09/2023)   Social Connection and Isolation Panel [NHANES]    Frequency of Communication with Friends and Family: More than three times a week    Frequency of Social Gatherings with Friends and  Family: More than three times a week    Attends Religious Services: More than 4 times per year    Active Member of Clubs or Organizations: No    Attends Banker Meetings: Never    Marital Status: Widowed  Intimate Partner Violence: Not At Risk (08/09/2023)   Humiliation, Afraid, Rape, and Kick questionnaire    Fear of Current or Ex-Partner: No    Emotionally Abused: No    Physically Abused: No    Sexually Abused: No    Review of Systems: ROS negative except for what is noted on the assessment and plan.  There were no vitals filed for this visit.  Physical Exam: Constitutional: well-appearing *** sitting in ***, in no acute distress HENT: normocephalic atraumatic, mucous membranes moist Eyes: conjunctiva non-erythematous Cardiovascular: regular rate and rhythm, no m/r/g Pulmonary/Chest: normal work of breathing  on room air, lungs clear to auscultation bilaterally Abdominal: soft, non-tender, non-distended MSK: normal bulk and tone Neurological: alert & oriented x 3, no focal deficit Skin: warm and dry Psych: normal mood and behavior  Assessment & Plan:   OUD: - suboxone 2.5 films/day - UDS ? - was using cocaine, + meth on uds   Patient {GC/GE:[id number]::"discussed with","seen with"} Dr. {WUJWJ:[id number]::"NFAOZHYQ","M. Hoffman","Mullen","Narendra","Vincent","Guilloud","Lau","Machen"}  No problem-specific Assessment & Plan notes found for this encounter.   Elza Rafter, D.O. [facility], PGY-3 Phone: [phone] Date 09/06/2023 Time 7:41 AM

## 2023-10-11 ENCOUNTER — Ambulatory Visit: Payer: Self-pay | Admitting: Student

## 2023-10-11 ENCOUNTER — Other Ambulatory Visit: Payer: Self-pay | Admitting: Student

## 2023-10-11 ENCOUNTER — Other Ambulatory Visit (HOSPITAL_COMMUNITY): Payer: Self-pay

## 2023-10-11 DIAGNOSIS — F1120 Opioid dependence, uncomplicated: Secondary | ICD-10-CM

## 2023-10-11 NOTE — Telephone Encounter (Unsigned)
 Copied from CRM [id number]. Topic: Clinical - Medical Advice >> Oct 11, 2023  2:18 PM Everette Rank wrote: Reason for CRM: Buprenorphine HCl-Naloxone HCl (SUBOXONE) 8-2 MG FILM-Patient set app for 04/14 but is out of medication. Needs to it refilled. Needs call back on update. [phone] Pharmacy: [facility] - [place], [place] - [address] [place] [address] Phone: [phone] Fax: [phone] Hours: Not open 24 hours

## 2023-10-12 ENCOUNTER — Other Ambulatory Visit (HOSPITAL_COMMUNITY): Payer: Self-pay

## 2023-10-18 ENCOUNTER — Other Ambulatory Visit (HOSPITAL_COMMUNITY): Payer: Self-pay

## 2023-10-18 ENCOUNTER — Ambulatory Visit: Payer: Self-pay | Admitting: Student

## 2023-10-18 ENCOUNTER — Other Ambulatory Visit (HOSPITAL_BASED_OUTPATIENT_CLINIC_OR_DEPARTMENT_OTHER): Payer: Self-pay

## 2023-10-18 ENCOUNTER — Other Ambulatory Visit: Payer: Self-pay | Admitting: Internal Medicine

## 2023-10-18 NOTE — Progress Notes (Deleted)
   Established Patient Office Visit  Subjective   Patient ID: Robert Kramer, male    DOB: 03-19-1976  Age: 48 y.o. MRN: [id number]  No chief complaint on file.   Robert Kramer is a 48 y.o. who presents to the clinic for ***. Please see problem based assessment and plan for additional details.  Patient presents with a history of opoid use disorder on suboxone *** mg ***ID. Patient*** adverse effects including nausea, vomiting, constipation. They *** relapses or cravings. Per PDMP, Dr. Carolann Chum Bowen is prescribng the Suboxone now and it was last filled on 10/13/2023.   ****? Plan: -Contract UTD -Toxassure ordered, prior toxassure was *** and was ***  -Bowel regimen:    Patient Active Problem List   Diagnosis Date Noted   Healthcare maintenance 08/23/2023   History of endocarditis 03/18/2020   Hepatitis C virus infection 03/18/2020   Normocytic anemia 03/18/2020   Thrombocytopenia (HCC) 03/11/2020   Bipolar 1 disorder (HCC)    Opioid use disorder, severe, dependence (HCC) 04/15/2013     Objective:     There were no vitals taken for this visit. BP Readings from Last 3 Encounters:  08/23/23 (!) 131/94  08/19/23 115/87  08/09/23 136/79   Wt Readings from Last 3 Encounters:  08/23/23 171 lb 9.6 oz (77.8 kg)  08/18/23 179 lb 3 oz (81.3 kg)  08/09/23 179 lb 3.2 oz (81.3 kg)      Physical Exam   No results found for any visits on 10/18/23.  Last metabolic panel Lab Results  Component Value Date   GLUCOSE 85 08/18/2023   NA 137 08/18/2023   K 3.9 08/18/2023   CL 103 08/18/2023   CO2 24 08/18/2023   BUN 17 08/18/2023   CREATININE 0.85 08/18/2023   GFRNONAA >60 08/18/2023   CALCIUM 9.3 08/18/2023   PROT 8.6 (H) 08/18/2023   ALBUMIN 4.5 08/18/2023   LABGLOB 3.0 08/09/2023   AGRATIO 0.9 (L) 05/21/2020   BILITOT 2.6 (H) 08/18/2023   ALKPHOS 69 08/18/2023   AST 36 08/18/2023   ALT 14 08/18/2023   ANIONGAP 10 08/18/2023   Last lipids No results found for:  "CHOL", "HDL", "LDLCALC", "LDLDIRECT", "TRIG", "CHOLHDL" Last hemoglobin A1c Lab Results  Component Value Date   HGBA1C 6.3 (H) 03/11/2020      The ASCVD Risk score (Arnett DK, et al., 2019) failed to calculate for the following reasons:   Cannot find a previous HDL lab   Cannot find a previous total cholesterol lab    Assessment & Plan:   Problem List Items Addressed This Visit   None   No follow-ups on file.    Aurora Lees, DO

## 2023-10-19 ENCOUNTER — Encounter (HOSPITAL_COMMUNITY): Payer: Self-pay

## 2023-10-19 ENCOUNTER — Other Ambulatory Visit (HOSPITAL_COMMUNITY): Payer: Self-pay

## 2023-11-19 NOTE — Progress Notes (Deleted)
 CARDIOLOGY CONSULT NOTE       Patient ID: Robert Kramer MRN: [id number] DOB/AGE: 1975/09/11 48 y.o.  Admit date: (Not on file) Referring Physician: Lydia Sams Primary Physician: Jonelle Neri, DO Primary Cardiologist: New Reason for Consultation: Chest discomfort  Active Problems:   * No active hospital problems. *   HPI:  48 y.o. referred by Dr Lydia Sams for chest discomfort. History of GAD, Bipolar, polysubstance abuse on Suboxone  but still smoking and using  amphetamines,THC and cocaine.   Seen in ED for drug overdose 08/18/23 With agitation complained of some chest discomfort. Troponin negative and ECG no acute ischemic changes. Was HTN and agitated from drug use on presentation. He was Rx with Haldol , versed , valium  , benadryl  and iv fluids   Echo done 08/23/23 with EF 60-65% Normal RV trivial MR mod/severe TR trivial AR No evidence of SBE/vegetations.   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 He needed his left shoulder debrided for septic arthritis. He underwent angiovac procedure with Dr Deloise Ferries from [facility]  OPt note 03/21/20 reviewed. Looks like procedure done with echmo circuit and TEE guidance to debulk TV vegetations Did not remove all of it or chronic mass/vegetation attache to RA wall.  ***  ROS All other systems reviewed and negative except as noted above  Past Medical History:  Diagnosis Date  . Anxiety   . Bipolar 1 disorder (HCC)   . Bipolar 1 disorder (HCC)   . DDD (degenerative disc disease), lumbar   . Depression   . Drug-seeking behavior   . Hallux valgus with bunions   . Mood swings   . Polysubstance abuse (HCC)     Family History  Problem Relation Age of Onset  . Cancer Father     Social History   Socioeconomic History  . Marital status: Legally Separated    Spouse name: Not on file  . Number of children:  Not on file  . Years of education: Not on file  . Highest education level: Not on file  Occupational History  . Not on file  Tobacco Use  . Smoking status: Every Day    Current packs/day: 0.50    Average packs/day: 0.5 packs/day for 5.0 years (2.5 ttl pk-yrs)    Types: Cigarettes  . Smokeless tobacco: Never  . Tobacco comments:    0.5  PPD / TRYING TO QUIT  Substance and Sexual Activity  . Alcohol use: Not Currently    Alcohol/week: 1.0 standard drink of alcohol    Types: 1 Cans of beer per week    Comment: occ  . Drug use: No    Types: Cocaine, Marijuana    Comment: opiates - quit 2017  . Sexual activity: Not on file  Other Topics Concern  . Not on file  Social History Narrative  . Not on file   Social Drivers of Health   Financial Resource Strain: High Risk (08/09/2023)   Overall Financial Resource Strain (CARDIA)   . Difficulty of Paying Living Expenses: Hard  Food Insecurity: No Food Insecurity (08/09/2023)   Hunger Vital Sign   . Worried About Programme researcher, broadcasting/film/video in the Last Year: Never true   . Ran Out of Food in the Last Year: Never true  Transportation Needs: No Transportation Needs (08/09/2023)   PRAPARE - Transportation   . Lack of Transportation (Medical): No   .  Lack of Transportation (Non-Medical): No  Physical Activity: Inactive (08/09/2023)   Exercise Vital Sign   . Days of Exercise per Week: 0 days   . Minutes of Exercise per Session: 0 min  Stress: No Stress Concern Present (08/09/2023)   [name]   . Feeling of Stress : Not at all  Social Connections: Moderately Isolated (08/09/2023)   Social Connection and Isolation Panel [NHANES]   . Frequency of Communication with Friends and Family: More than three times a week   . Frequency of Social Gatherings with Friends and Family: More than three times a week   . Attends Religious Services: More than 4 times per year   . Active Member of Clubs or  Organizations: No   . Attends Banker Meetings: Never   . Marital Status: Widowed  Intimate Partner Violence: Not At Risk (08/09/2023)   Humiliation, Afraid, Rape, and Kick questionnaire   . Fear of Current or Ex-Partner: No   . Emotionally Abused: No   . Physically Abused: No   . Sexually Abused: No    Past Surgical History:  Procedure Laterality Date  . APPLICATION OF ANGIOVAC N/A 03/21/2020   Procedure: APPLICATION OF ANGIOVAC;  Surgeon: Hilarie Lovely, MD;  Location: [facility] OR;  Service: Vascular;  Laterality: N/A;  . fatty tumor removed from left foot    . SHOULDER ARTHROSCOPY Left 03/15/2020   Procedure: ARTHROSCOPY SHOULDER, IRRIGATION AND DEBRIDEMENT OF SHOULDER BURSECTOMY;  Surgeon: Osa Blase, MD;  Location: [facility] OR;  Service: Orthopedics;  Laterality: Left;  . TEE WITHOUT CARDIOVERSION N/A 03/15/2020   Procedure: TRANSESOPHAGEAL ECHOCARDIOGRAM (TEE);  Surgeon: Hugh Madura, MD;  Location: [facility] OR;  Service: Cardiovascular;  Laterality: N/A;      Current Outpatient Medications:  .  Buprenorphine  HCl-Naloxone  HCl (SUBOXONE ) 8-2 MG FILM, Dissolve 1 film under tongue in the morning, 1/2 film in the afternoon, and 1 film at night., Disp: 35 Film, Rfl: 0 .  hydrOXYzine  (ATARAX ) 10 MG tablet, Take 10 mg by mouth 3 (three) times daily as needed for anxiety., Disp: , Rfl:     Physical Exam: There were no vitals taken for this visit.    Affect appropriate Chronically ill male  HEENT: normal Neck supple with no adenopathy JVP elevated no bruits no thyromegaly Lungs clear with no wheezing and good diaphragmatic motion Heart:  S1/S2 TR murmur, no rub, gallop or click PMI normal Abdomen: benighn, BS positve, no tenderness, no AAA no bruit.  No HSM or HJR Distal pulses intact with no bruits No edema Neuro non-focal Skin warm and dry No muscular weakness   Labs:   Lab Results  Component Value Date   WBC 8.8 08/18/2023   HGB 16.5 08/18/2023   HCT 49.0  08/18/2023   MCV 87.5 08/18/2023   PLT 193 08/18/2023   No results for input(s): "NA", "K", "CL", "CO2", "BUN", "CREATININE", "CALCIUM ", "PROT", "BILITOT", "ALKPHOS", "ALT", "AST", "GLUCOSE" in the last 168 hours.  Invalid input(s): "LABALBU" No results found for: "CKTOTAL", "CKMB", "CKMBINDEX", "TROPONINI" No results found for: "CHOL" No results found for: "HDL" No results found for: "LDLCALC" No results found for: "TRIG" No results found for: "CHOLHDL" No results found for: "LDLDIRECT"    Radiology: No results found.  EKG: SR rate 96 PVC no acute ST changes    ASSESSMENT AND PLAN:   Chest Discomfort:  in setting of drug overdose, agitation and HTN form amphetamines. Troponin negative ECG no acute  changes.  CTA done 08/18/23 with no PE Has some chronic scarring in RUL prior cavitary lesions from septic emboli.  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 Given ongoing drug use not a candidate for surgery or TV clip procedure   ***  Signed: Janelle Mediate 11/19/2023, 2:11 PM

## 2023-11-25 ENCOUNTER — Ambulatory Visit: Payer: Medicaid Other | Attending: Cardiovascular Disease | Admitting: Cardiovascular Disease

## 2024-01-17 ENCOUNTER — Encounter: Payer: Self-pay | Admitting: Emergency Medicine

## 2024-02-03 ENCOUNTER — Ambulatory Visit: Payer: Self-pay | Admitting: Student

## 2024-03-27 ENCOUNTER — Ambulatory Visit: Payer: Self-pay

## 2024-03-27 NOTE — Telephone Encounter (Signed)
 FYI Only or Action Required?: FYI only for provider.  Patient was last seen in primary care on 08/23/2023 by Jimmy Anna SAILOR, DO.  Called Nurse Triage reporting Medication Refill.  Symptoms began several months ago.  Interventions attempted: Nothing.  Symptoms are: restlessness, night sweats, craving for drugs, tense/feeling on edge gradually worsening.  Triage Disposition: See PCP When Office is Open (Within 3 Days)  Patient/caregiver understands and will follow disposition?: Yes               Message from Alfonso ORN sent at 03/27/2024 11:31 AM EDT  Summary: thinking about relapsing getting back on herion   pt really been on edge , thinking about relapsing getting back on herion ,waking up every hour with night sweats and restlessness pt. trying to get on Buprenorphine  HCl-Naloxone  HCl (SUBOXONE ) 8-2 MG FILM         Reason for Disposition  Prescription request for new medicine (not a refill)  Answer Assessment - Initial Assessment Questions 1. DRUG NAME: What medicine do you need to have refilled?     Buprenorphine  HCl-Naloxone  HCl (SUBOXONE ) 8-2 MG FILM [[id number]]  2. REFILLS REMAINING: How many refills are remaining? Notes: The label on the medicine or pill bottle will show how many refills are remaining. If there are no refills remaining, then a renewal may be needed.     0.  3. EXPIRATION DATE: What is the expiration date? Note: The label states when the prescription will expire, and thus can no longer be refilled.)     02/19/24.  4. PRESCRIBER: Who prescribed it? Note: The prescribing doctor or group is responsible for refill approvals..     Dr Jimmy.  5. PHARMACY: Have you contacted your pharmacy (drugstore)? Note: Some pharmacies will contact the doctor (or NP/PA).      Yes.  6. SYMPTOMS: Do you have any symptoms?     Restless, night sweats, tense/on edge. Cravings to go back to heroin. He states he has been using marijuana but denies any  other drug use for about 6 months.  7. PREGNANCY: Is there any chance that you are pregnant? When was your last menstrual period?     N/A.  Also provided patient with information on [facility] as an additional resource.  Protocols used: Medication Refill and Renewal Call-A-AH

## 2024-03-29 ENCOUNTER — Ambulatory Visit

## 2024-04-04 ENCOUNTER — Telehealth: Payer: Self-pay | Admitting: *Deleted

## 2024-04-04 NOTE — Telephone Encounter (Signed)
  Attempted to reach out to see if further information was needed no answer.  Copied from CRM #[id number]. Topic: Appointments - Appointment Info/Confirmation >> Apr 04, 2024  2:38 PM Miquel SAILOR wrote: Patient/patient representative is calling for information regarding an appointment.   Jennie from [employer]/[phone]:verified PT app for 10/01 at 1:45 pm. Verified no further questions

## 2024-04-05 ENCOUNTER — Ambulatory Visit: Admitting: Student

## 2024-04-05 ENCOUNTER — Encounter: Payer: Self-pay | Admitting: Student

## 2024-04-05 VITALS — BP 136/81 | HR 80 | Temp 97.8°F | Ht 71.0 in | Wt 160.0 lb

## 2024-04-05 DIAGNOSIS — Z1211 Encounter for screening for malignant neoplasm of colon: Secondary | ICD-10-CM

## 2024-04-05 DIAGNOSIS — F1120 Opioid dependence, uncomplicated: Secondary | ICD-10-CM

## 2024-04-05 MED ORDER — BUPRENORPHINE HCL-NALOXONE HCL 8-2 MG SL FILM: ORAL_FILM | SUBLINGUAL | 0 refills | Status: DC

## 2024-04-05 NOTE — Assessment & Plan Note (Signed)
 Patient has past medical history of OUD.  Previously used heroin.  Has been doing well with Suboxone  previously.  Unfortunately he was lost to follow-up to our clinic in the setting of incarceration.  He is now out.  He was on Suboxone  during his stay in jail.  Since getting out, he has admitted to using Suboxone  from someone else.  He wants to get his on Suboxone  now.  He denies any withdrawal symptoms at this time given that he is on Suboxone  from a friend.  Will reinitiate his Suboxone  today.  Will obtain urine.  He does endorse using THC.  Plan: - Follow up urine tox screen - Initiate Suboxone  2 films twice daily - Follow-up in 14 days

## 2024-04-05 NOTE — Progress Notes (Signed)
   CC: Suboxone  follow-up  HPI:  [name] is a 48 y.o. male with a past medical history of opioid use disorder who presents for Suboxone  follow-up.  Please see assessment and plan for full HPI.  Past Medical History:  Diagnosis Date   Anxiety    Bipolar 1 disorder (HCC)    Bipolar 1 disorder (HCC)    DDD (degenerative disc disease), lumbar    Depression    Drug-seeking behavior    Hallux valgus with bunions    Mood swings    Polysubstance abuse (HCC)      Current Outpatient Medications:    Buprenorphine  HCl-Naloxone  HCl (SUBOXONE ) 8-2 MG FILM, Dissolve 1 film under tongue in the morning, and 1 film under tongue in the evening, Disp: 28 Film, Rfl: 0   hydrOXYzine  (ATARAX ) 10 MG tablet, Take 10 mg by mouth 3 (three) times daily as needed for anxiety., Disp: , Rfl:   Review of Systems:   Negative except for what is stated in HPI  Physical Exam:  Vitals:   04/05/24 1400 04/05/24 1429  BP: (!) 156/84 136/81  Pulse: 80 80  Temp: 97.8 F (36.6 C)   TempSrc: Oral   SpO2: 98%   Weight: 160 lb (72.6 kg)   Height: 5' 11 (1.803 m)    General: Patient is sitting comfortably in the room  Head: Normocephalic, atraumatic  Cardio: Regular rate and rhythm, no murmurs, rubs or gallops. Pulmonary: Clear to ausculation bilaterally with no rales, rhonchi, and crackles   Assessment & Plan:   Assessment & Plan Opioid use disorder, severe, dependence (HCC) Patient has past medical history of OUD.  Previously used heroin.  Has been doing well with Suboxone  previously.  Unfortunately he was lost to follow-up to our clinic in the setting of incarceration.  He is now out.  He was on Suboxone  during his stay in jail.  Since getting out, he has admitted to using Suboxone  from someone else.  He wants to get his on Suboxone  now.  He denies any withdrawal symptoms at this time given that he is on Suboxone  from a friend.  Will reinitiate his Suboxone  today.  Will obtain urine.  He does  endorse using THC.  Plan: - Follow up urine tox screen - Initiate Suboxone  2 films twice daily - Follow-up in 14 days Colon cancer screening Patient referred for colonoscopy for colon cancer screening.   Patient discussed with Dr. Rosan Libby Blanch, DO Internal Medicine Resident PGY-3

## 2024-04-05 NOTE — Patient Instructions (Signed)
 Robert Kramer, Robert Kramer you for allowing me to take part in your care today.  Here are your instructions.  1.  I have refilled your Suboxone .  Pick it up from your pharmacy.  Please take 1 film in the morning and 1 film at night.  I gave you 2-week supply.  2.  I will call you with the results of the urine test.  3.  Please come back in 2 weeks  PLEASE BRING YOUR MEDICATIONS TO EVERY APPOINTMENT  Thank you, Dr. Tobie  If you have any other questions please contact the internal medicine clinic at [phone] If it is after hours, please call the [place] [facility] at [phone] and then ask the person who picks up for the resident on call.

## 2024-04-09 LAB — TOXASSURE SELECT,+ANTIDEPR,UR

## 2024-04-10 ENCOUNTER — Ambulatory Visit: Payer: Self-pay | Admitting: Student

## 2024-04-12 NOTE — Progress Notes (Signed)
 Internal Medicine Clinic Attending  Case discussed with the resident at the time of the visit.  We reviewed the resident's history and exam and pertinent patient test results.  I agree with the assessment, diagnosis, and plan of care documented in the resident's note.

## 2024-04-17 ENCOUNTER — Other Ambulatory Visit: Payer: Self-pay | Admitting: Student

## 2024-04-17 MED ORDER — BUPRENORPHINE HCL-NALOXONE HCL 8-2 MG SL FILM: ORAL_FILM | SUBLINGUAL | 0 refills | Status: AC

## 2024-04-17 NOTE — Telephone Encounter (Signed)
 Copied from CRM [id number]. Topic: Clinical - Medication Refill >> Apr 17, 2024 12:11 PM Debby BROCKS wrote: Medication: Buprenorphine  HCl-Naloxone  HCl (SUBOXONE ) 8-2 MG FILM    Has the patient contacted their pharmacy? Yes (Agent: If no, request that the patient contact the pharmacy for the refill. If patient does not wish to contact the pharmacy document the reason why and proceed with request.) (Agent: If yes, when and what did the pharmacy advise?)  This is the patient's preferred pharmacy:  [facility] - [place], [place] - [address] [place] [address] Phone: [phone] Fax: [phone]  Is this the correct pharmacy for this prescription? Yes If no, delete pharmacy and type the correct one.   Has the prescription been filled recently? No  Is the patient out of the medication? No, but will be out by his appointment on the 22nd  Has the patient been seen for an appointment in the last year OR does the patient have an upcoming appointment? Yes  Can we respond through MyChart? Yes  Agent: Please be advised that Rx refills may take up to 3 business days. We ask that you follow-up with your pharmacy.

## 2024-04-24 ENCOUNTER — Telehealth: Payer: Self-pay

## 2024-04-24 NOTE — Telephone Encounter (Signed)
 RTC to [facility].  Verified dosing of Buprenorphine  HCL-Naloxone  for patient .  Asberry also stated that patient was also positive for Cocaine and other substances when he came in.  Verified last drug scree as being positive for substances as well.  Stated that patient will not be out to to keep appointment on 04/26/2024.  Will advise patient to reschedule appointment when he gets released.

## 2024-04-24 NOTE — Telephone Encounter (Signed)
 Copied from CRM [id number]. Topic: Clinical - Medication Question >> Apr 24, 2024  1:12 PM Chiquita SQUIBB wrote: Reason for CRM: Asberry from [facility] is calling in to confirm the dosage of the Buprenorphine  HCl-Naloxone  HCl (SUBOXONE ) 8-2 MG FILM for the patient. The call back number is [phone]

## 2024-04-26 ENCOUNTER — Ambulatory Visit: Payer: Self-pay | Admitting: Student

## 2024-09-19 ENCOUNTER — Ambulatory Visit: Payer: Self-pay | Admitting: Student
